# Patient Record
Sex: Female | Born: 1955 | Race: Black or African American | Hispanic: No | Marital: Married | Smoking: Never smoker
Health system: Southern US, Community
[De-identification: ages and names within clinical notes are randomized; demographics above are authoritative.]

## PROBLEM LIST (undated history)

## (undated) DIAGNOSIS — K219 Gastro-esophageal reflux disease without esophagitis: Secondary | ICD-10-CM

## (undated) DIAGNOSIS — I471 Supraventricular tachycardia, unspecified: Secondary | ICD-10-CM

## (undated) DIAGNOSIS — E1142 Type 2 diabetes mellitus with diabetic polyneuropathy: Secondary | ICD-10-CM

## (undated) DIAGNOSIS — Z8719 Personal history of other diseases of the digestive system: Secondary | ICD-10-CM

## (undated) DIAGNOSIS — E119 Type 2 diabetes mellitus without complications: Secondary | ICD-10-CM

## (undated) DIAGNOSIS — M1990 Unspecified osteoarthritis, unspecified site: Secondary | ICD-10-CM

## (undated) DIAGNOSIS — K589 Irritable bowel syndrome without diarrhea: Secondary | ICD-10-CM

## (undated) DIAGNOSIS — I700 Atherosclerosis of aorta: Secondary | ICD-10-CM

## (undated) DIAGNOSIS — I480 Paroxysmal atrial fibrillation: Secondary | ICD-10-CM

## (undated) DIAGNOSIS — R51 Headache: Secondary | ICD-10-CM

## (undated) DIAGNOSIS — N183 Chronic kidney disease, stage 3 unspecified: Secondary | ICD-10-CM

## (undated) DIAGNOSIS — K635 Polyp of colon: Secondary | ICD-10-CM

## (undated) DIAGNOSIS — I739 Peripheral vascular disease, unspecified: Secondary | ICD-10-CM

## (undated) DIAGNOSIS — R490 Dysphonia: Secondary | ICD-10-CM

## (undated) DIAGNOSIS — R0600 Dyspnea, unspecified: Secondary | ICD-10-CM

## (undated) DIAGNOSIS — M7062 Trochanteric bursitis, left hip: Secondary | ICD-10-CM

## (undated) DIAGNOSIS — I499 Cardiac arrhythmia, unspecified: Secondary | ICD-10-CM

## (undated) DIAGNOSIS — K6389 Other specified diseases of intestine: Secondary | ICD-10-CM

## (undated) DIAGNOSIS — I2510 Atherosclerotic heart disease of native coronary artery without angina pectoris: Secondary | ICD-10-CM

## (undated) DIAGNOSIS — E079 Disorder of thyroid, unspecified: Secondary | ICD-10-CM

## (undated) DIAGNOSIS — I452 Bifascicular block: Secondary | ICD-10-CM

## (undated) DIAGNOSIS — I454 Nonspecific intraventricular block: Secondary | ICD-10-CM

## (undated) DIAGNOSIS — D509 Iron deficiency anemia, unspecified: Secondary | ICD-10-CM

## (undated) DIAGNOSIS — M7582 Other shoulder lesions, left shoulder: Secondary | ICD-10-CM

## (undated) DIAGNOSIS — D631 Anemia in chronic kidney disease: Secondary | ICD-10-CM

## (undated) DIAGNOSIS — I10 Essential (primary) hypertension: Secondary | ICD-10-CM

## (undated) DIAGNOSIS — Z7982 Long term (current) use of aspirin: Secondary | ICD-10-CM

## (undated) DIAGNOSIS — Z8619 Personal history of other infectious and parasitic diseases: Secondary | ICD-10-CM

## (undated) DIAGNOSIS — D649 Anemia, unspecified: Secondary | ICD-10-CM

## (undated) DIAGNOSIS — R519 Headache, unspecified: Secondary | ICD-10-CM

## (undated) DIAGNOSIS — C73 Malignant neoplasm of thyroid gland: Secondary | ICD-10-CM

## (undated) DIAGNOSIS — E1140 Type 2 diabetes mellitus with diabetic neuropathy, unspecified: Secondary | ICD-10-CM

## (undated) DIAGNOSIS — K2970 Gastritis, unspecified, without bleeding: Secondary | ICD-10-CM

## (undated) DIAGNOSIS — C801 Malignant (primary) neoplasm, unspecified: Secondary | ICD-10-CM

## (undated) DIAGNOSIS — H269 Unspecified cataract: Secondary | ICD-10-CM

## (undated) DIAGNOSIS — R0609 Other forms of dyspnea: Secondary | ICD-10-CM

## (undated) DIAGNOSIS — R001 Bradycardia, unspecified: Secondary | ICD-10-CM

## (undated) DIAGNOSIS — E785 Hyperlipidemia, unspecified: Secondary | ICD-10-CM

## (undated) DIAGNOSIS — Z85850 Personal history of malignant neoplasm of thyroid: Secondary | ICD-10-CM

## (undated) DIAGNOSIS — R1310 Dysphagia, unspecified: Secondary | ICD-10-CM

## (undated) DIAGNOSIS — I4891 Unspecified atrial fibrillation: Secondary | ICD-10-CM

## (undated) DIAGNOSIS — E559 Vitamin D deficiency, unspecified: Secondary | ICD-10-CM

## (undated) HISTORY — DX: Type 2 diabetes mellitus with diabetic polyneuropathy: E11.42

## (undated) HISTORY — DX: Other specified diseases of intestine: K63.89

## (undated) HISTORY — PX: CORONARY ARTERY BYPASS GRAFT: SHX141

## (undated) HISTORY — DX: Disorder of thyroid, unspecified: E07.9

## (undated) HISTORY — PX: CHOLECYSTECTOMY: SHX55

## (undated) HISTORY — DX: Unspecified cataract: H26.9

## (undated) HISTORY — PX: CORONARY ANGIOPLASTY: SHX604

## (undated) HISTORY — PX: THYROIDECTOMY, PARTIAL: SHX18

## (undated) HISTORY — DX: Malignant (primary) neoplasm, unspecified: C80.1

## (undated) HISTORY — PX: JOINT REPLACEMENT: SHX530

## (undated) HISTORY — PX: ABDOMINAL HYSTERECTOMY: SHX81

---
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
## 2000-11-22 HISTORY — PX: CORONARY ARTERY BYPASS GRAFT: SHX141

## 2001-05-03 DIAGNOSIS — Z951 Presence of aortocoronary bypass graft: Secondary | ICD-10-CM

## 2001-05-03 DIAGNOSIS — I2542 Coronary artery dissection: Secondary | ICD-10-CM

## 2001-05-03 DIAGNOSIS — I2510 Atherosclerotic heart disease of native coronary artery without angina pectoris: Secondary | ICD-10-CM

## 2001-05-03 HISTORY — PX: CORONARY ARTERY BYPASS GRAFT: SHX141

## 2001-05-03 HISTORY — DX: Presence of aortocoronary bypass graft: Z95.1

## 2001-05-03 HISTORY — DX: Atherosclerotic heart disease of native coronary artery without angina pectoris: I25.10

## 2001-05-03 HISTORY — DX: Coronary artery dissection: I25.42

## 2001-05-03 HISTORY — PX: LEFT HEART CATH AND CORONARY ANGIOGRAPHY: CATH118249

## 2004-02-12 HISTORY — PX: CORONARY ANGIOPLASTY WITH STENT PLACEMENT: SHX49

## 2013-11-02 ENCOUNTER — Ambulatory Visit: Payer: Self-pay | Admitting: Gastroenterology

## 2013-11-05 LAB — PATHOLOGY REPORT

## 2013-11-06 ENCOUNTER — Ambulatory Visit: Payer: Self-pay | Admitting: Family Medicine

## 2014-04-09 ENCOUNTER — Ambulatory Visit: Payer: Self-pay | Admitting: Family Medicine

## 2014-05-01 DIAGNOSIS — E039 Hypothyroidism, unspecified: Secondary | ICD-10-CM | POA: Insufficient documentation

## 2014-05-01 DIAGNOSIS — I4891 Unspecified atrial fibrillation: Secondary | ICD-10-CM | POA: Insufficient documentation

## 2014-10-22 ENCOUNTER — Ambulatory Visit: Payer: Self-pay | Admitting: Family Medicine

## 2014-11-08 ENCOUNTER — Ambulatory Visit: Payer: Self-pay | Admitting: Family Medicine

## 2014-11-22 ENCOUNTER — Ambulatory Visit: Payer: Self-pay | Admitting: Family Medicine

## 2014-12-04 ENCOUNTER — Ambulatory Visit: Payer: Self-pay | Admitting: Family Medicine

## 2014-12-27 ENCOUNTER — Ambulatory Visit: Payer: Self-pay | Admitting: Gastroenterology

## 2014-12-27 LAB — COMPREHENSIVE METABOLIC PANEL
ALBUMIN: 3.3 g/dL — AB (ref 3.4–5.0)
Alkaline Phosphatase: 71 U/L (ref 46–116)
Anion Gap: 9 (ref 7–16)
BUN: 37 mg/dL — AB (ref 7–18)
Bilirubin,Total: 0.5 mg/dL (ref 0.2–1.0)
CALCIUM: 8.9 mg/dL (ref 8.5–10.1)
Chloride: 96 mmol/L — ABNORMAL LOW (ref 98–107)
Co2: 33 mmol/L — ABNORMAL HIGH (ref 21–32)
Creatinine: 2.24 mg/dL — ABNORMAL HIGH (ref 0.60–1.30)
EGFR (African American): 29 — ABNORMAL LOW
EGFR (Non-African Amer.): 24 — ABNORMAL LOW
GLUCOSE: 138 mg/dL — AB (ref 65–99)
Osmolality: 287 (ref 275–301)
POTASSIUM: 3.1 mmol/L — AB (ref 3.5–5.1)
SGOT(AST): 57 U/L — ABNORMAL HIGH (ref 15–37)
SGPT (ALT): 107 U/L — ABNORMAL HIGH (ref 14–63)
Sodium: 138 mmol/L (ref 136–145)
Total Protein: 7.3 g/dL (ref 6.4–8.2)

## 2015-01-02 ENCOUNTER — Ambulatory Visit: Payer: Self-pay | Admitting: Gastroenterology

## 2015-01-10 ENCOUNTER — Ambulatory Visit: Payer: Self-pay | Admitting: Gastroenterology

## 2015-03-03 ENCOUNTER — Ambulatory Visit
Admit: 2015-03-03 | Disposition: A | Discharge status: Home / Self Care | Payer: Self-pay | Attending: Surgery | Admitting: Surgery

## 2015-03-10 ENCOUNTER — Ambulatory Visit
Admit: 2015-03-10 | Disposition: A | Discharge status: Home / Self Care | Payer: Self-pay | Attending: Internal Medicine | Admitting: Internal Medicine

## 2015-03-10 LAB — COMPREHENSIVE METABOLIC PANEL
ALBUMIN: 4.1 g/dL
ALK PHOS: 60 U/L
ALT: 39 U/L
AST: 34 U/L
Anion Gap: 9 (ref 7–16)
BUN: 16 mg/dL
Bilirubin,Total: 0.4 mg/dL
CALCIUM: 9 mg/dL
CHLORIDE: 96 mmol/L — AB
Co2: 31 mmol/L
Creatinine: 1.18 mg/dL — ABNORMAL HIGH
EGFR (African American): 58 — ABNORMAL LOW
EGFR (Non-African Amer.): 50 — ABNORMAL LOW
GLUCOSE: 99 mg/dL
POTASSIUM: 2.7 mmol/L — AB
Sodium: 136 mmol/L
Total Protein: 7.7 g/dL

## 2015-03-10 LAB — CBC CANCER CENTER
BASOS PCT: 1.3 %
Basophil #: 0.1 x10 3/mm (ref 0.0–0.1)
EOS PCT: 2 %
Eosinophil #: 0.2 x10 3/mm (ref 0.0–0.7)
HCT: 36 % (ref 35.0–47.0)
HGB: 11.6 g/dL — ABNORMAL LOW (ref 12.0–16.0)
Lymphocyte #: 3.2 x10 3/mm (ref 1.0–3.6)
Lymphocyte %: 40 %
MCH: 26.7 pg (ref 26.0–34.0)
MCHC: 32.4 g/dL (ref 32.0–36.0)
MCV: 83 fL (ref 80–100)
MONO ABS: 0.5 x10 3/mm (ref 0.2–0.9)
Monocyte %: 6.1 %
NEUTROS ABS: 4 x10 3/mm (ref 1.4–6.5)
NEUTROS PCT: 50.6 %
Platelet: 310 x10 3/mm (ref 150–440)
RBC: 4.36 10*6/uL (ref 3.80–5.20)
RDW: 14.5 % (ref 11.5–14.5)
WBC: 7.9 x10 3/mm (ref 3.6–11.0)

## 2015-03-10 LAB — PROTIME-INR
INR: 1
Prothrombin Time: 13.3 secs

## 2015-03-10 LAB — APTT: ACTIVATED PTT: 24.2 s (ref 23.6–35.9)

## 2015-03-13 LAB — CEA: CEA: 2.6 ng/mL (ref 0.0–4.7)

## 2015-03-13 LAB — CA 125: CA 125: 10.7 U/mL (ref 0.0–34.0)

## 2015-03-17 ENCOUNTER — Ambulatory Visit
Admit: 2015-03-17 | Disposition: A | Discharge status: Home / Self Care | Payer: Self-pay | Attending: Internal Medicine | Admitting: Internal Medicine

## 2015-03-17 LAB — SURGICAL PATHOLOGY

## 2015-03-19 ENCOUNTER — Other Ambulatory Visit: Payer: Self-pay | Admitting: Internal Medicine

## 2015-03-19 DIAGNOSIS — R1900 Intra-abdominal and pelvic swelling, mass and lump, unspecified site: Secondary | ICD-10-CM

## 2015-03-20 ENCOUNTER — Other Ambulatory Visit: Payer: Self-pay | Admitting: Internal Medicine

## 2015-03-20 DIAGNOSIS — K6389 Other specified diseases of intestine: Secondary | ICD-10-CM

## 2015-03-20 DIAGNOSIS — D219 Benign neoplasm of connective and other soft tissue, unspecified: Secondary | ICD-10-CM

## 2015-03-24 NOTE — Progress Notes (Signed)
Spoke with Hailey Howard. EUS scheduled for 04-10-15. Instructed on registration, calling to find arrival time, prep. Reports asa as only anticoagulant. These directions also mailed to her home address along with my contact information for any further questions that may arise.

## 2015-03-24 NOTE — Progress Notes (Signed)
Hailey Howard left voicemail to return call. Need to arrange EUS for 04-10-15 at [facility]

## 2015-03-25 ENCOUNTER — Other Ambulatory Visit: Payer: Self-pay | Admitting: Otolaryngology

## 2015-03-25 DIAGNOSIS — E041 Nontoxic single thyroid nodule: Secondary | ICD-10-CM

## 2015-03-28 ENCOUNTER — Ambulatory Visit
Admission: RE | Admit: 2015-03-28 | Discharge: 2015-03-28 | Disposition: A | Discharge status: Home / Self Care | Payer: Medicare HMO | Source: Ambulatory Visit | Attending: Otolaryngology | Admitting: Otolaryngology

## 2015-03-28 DIAGNOSIS — E041 Nontoxic single thyroid nodule: Secondary | ICD-10-CM | POA: Diagnosis present

## 2015-03-31 ENCOUNTER — Ambulatory Visit
Admission: RE | Admit: 2015-03-31 | Discharge: 2015-03-31 | Disposition: A | Discharge status: Home / Self Care | Payer: Medicare HMO | Source: Ambulatory Visit | Attending: Internal Medicine | Admitting: Internal Medicine

## 2015-03-31 DIAGNOSIS — R1900 Intra-abdominal and pelvic swelling, mass and lump, unspecified site: Secondary | ICD-10-CM | POA: Insufficient documentation

## 2015-03-31 DIAGNOSIS — D219 Benign neoplasm of connective and other soft tissue, unspecified: Secondary | ICD-10-CM

## 2015-03-31 MED ORDER — INDIUM IN-111 PENTETREOTIDE IV KIT
7.1800 | PACK | Freq: Once | INTRAVENOUS | Status: AC | PRN
  Administered 2015-03-31: 7.18 via INTRAVENOUS

## 2015-04-01 ENCOUNTER — Encounter
Admission: RE | Admit: 2015-04-01 | Discharge: 2015-04-01 | Disposition: A | Discharge status: Home / Self Care | Payer: Medicare HMO | Source: Ambulatory Visit | Attending: Internal Medicine | Admitting: Internal Medicine

## 2015-04-01 DIAGNOSIS — R1900 Intra-abdominal and pelvic swelling, mass and lump, unspecified site: Secondary | ICD-10-CM | POA: Diagnosis not present

## 2015-04-02 ENCOUNTER — Encounter
Admission: RE | Admit: 2015-04-02 | Discharge: 2015-04-02 | Disposition: A | Discharge status: Home / Self Care | Payer: Medicare HMO | Source: Ambulatory Visit | Attending: Internal Medicine | Admitting: Internal Medicine

## 2015-04-08 ENCOUNTER — Other Ambulatory Visit: Payer: Self-pay | Admitting: Otolaryngology

## 2015-04-08 DIAGNOSIS — E041 Nontoxic single thyroid nodule: Secondary | ICD-10-CM

## 2015-04-09 ENCOUNTER — Encounter: Payer: Self-pay | Admitting: *Deleted

## 2015-04-09 ENCOUNTER — Inpatient Hospital Stay: Payer: Medicare HMO | Attending: Internal Medicine | Admitting: Internal Medicine

## 2015-04-09 VITALS — BP 105/72 | HR 74 | Temp 96.8°F | Ht 67.0 in | Wt 226.0 lb

## 2015-04-09 DIAGNOSIS — R634 Abnormal weight loss: Secondary | ICD-10-CM

## 2015-04-09 DIAGNOSIS — R1900 Intra-abdominal and pelvic swelling, mass and lump, unspecified site: Secondary | ICD-10-CM

## 2015-04-09 DIAGNOSIS — E119 Type 2 diabetes mellitus without complications: Secondary | ICD-10-CM | POA: Diagnosis not present

## 2015-04-09 DIAGNOSIS — R109 Unspecified abdominal pain: Secondary | ICD-10-CM

## 2015-04-09 DIAGNOSIS — R1310 Dysphagia, unspecified: Secondary | ICD-10-CM | POA: Insufficient documentation

## 2015-04-09 DIAGNOSIS — Z79899 Other long term (current) drug therapy: Secondary | ICD-10-CM | POA: Diagnosis not present

## 2015-04-09 DIAGNOSIS — R599 Enlarged lymph nodes, unspecified: Secondary | ICD-10-CM | POA: Diagnosis not present

## 2015-04-09 DIAGNOSIS — I2510 Atherosclerotic heart disease of native coronary artery without angina pectoris: Secondary | ICD-10-CM

## 2015-04-09 DIAGNOSIS — R1903 Right lower quadrant abdominal swelling, mass and lump: Secondary | ICD-10-CM | POA: Diagnosis present

## 2015-04-09 DIAGNOSIS — I10 Essential (primary) hypertension: Secondary | ICD-10-CM

## 2015-04-10 ENCOUNTER — Encounter
Admission: RE | Disposition: A | Discharge status: Home / Self Care | Payer: Self-pay | Source: Ambulatory Visit | Attending: Gastroenterology

## 2015-04-10 ENCOUNTER — Encounter: Payer: Self-pay | Admitting: Anesthesiology

## 2015-04-10 ENCOUNTER — Ambulatory Visit
Admission: RE | Admit: 2015-04-10 | Discharge: 2015-04-10 | Disposition: A | Discharge status: Home / Self Care | Payer: Medicare HMO | Source: Ambulatory Visit | Attending: Gastroenterology | Admitting: Gastroenterology

## 2015-04-10 DIAGNOSIS — Z539 Procedure and treatment not carried out, unspecified reason: Secondary | ICD-10-CM | POA: Diagnosis not present

## 2015-04-10 DIAGNOSIS — R1900 Intra-abdominal and pelvic swelling, mass and lump, unspecified site: Secondary | ICD-10-CM | POA: Diagnosis present

## 2015-04-10 HISTORY — DX: Essential (primary) hypertension: I10

## 2015-04-10 HISTORY — DX: Type 2 diabetes mellitus without complications: E11.9

## 2015-04-10 HISTORY — DX: Unspecified osteoarthritis, unspecified site: M19.90

## 2015-04-10 HISTORY — DX: Anemia, unspecified: D64.9

## 2015-04-10 HISTORY — DX: Gastro-esophageal reflux disease without esophagitis: K21.9

## 2015-04-10 HISTORY — DX: Atherosclerotic heart disease of native coronary artery without angina pectoris: I25.10

## 2015-04-10 SURGERY — UPPER ENDOSCOPIC ULTRASOUND (EUS) RADIAL
Anesthesia: General

## 2015-04-10 MED ORDER — SODIUM CHLORIDE 0.9 % IV SOLN: INTRAVENOUS | Status: DC

## 2015-04-10 MED ORDER — SODIUM CHLORIDE 0.9 % IJ SOLN
1000.0000 mL | Freq: Once | INTRAMUSCULAR | Status: AC
  Administered 2015-04-10: 1000 mL via INTRAVENOUS
  Filled 2015-04-10: qty 1002

## 2015-04-11 ENCOUNTER — Telehealth: Payer: Self-pay

## 2015-04-11 NOTE — Telephone Encounter (Signed)
Oncology Nurse Navigator Documentation  Oncology Nurse Navigator Flowsheets 04/11/2015  Navigator Encounter Type Telephone  Barriers/Navigation Needs Education  Interventions Coordination of Care  Coordination of Care EUS  Time Spent with Patient 21   Was notified by Endo that Hailey Howard ate and that her EUS was unable to be performed. Dr Ma Hillock was notified. She stated she "just forget". Rescheduled EUS for 04/28/15 with Dr Mont Dutton. Teachback performed on instructions as before and they are also being mailed to her home address. She is aware of registration process, calling to find out arrival time, remaining without anything to eat or drink after midnight. She was also told to monitor her blood sugar closely. ASA 81mg  remains only anticoagulant.

## 2015-04-14 ENCOUNTER — Telehealth: Payer: Self-pay

## 2015-04-14 ENCOUNTER — Other Ambulatory Visit: Payer: Self-pay | Admitting: Radiology

## 2015-04-14 NOTE — Telephone Encounter (Signed)
Oncology Nurse Navigator Documentation  Oncology Nurse Navigator Flowsheets 04/11/2015 04/14/2015  Navigator Encounter Type Telephone -  Barriers/Navigation Needs Education -  Interventions Coordination of Care Coordination of Care  Coordination of Care EUS EUS  Time Spent with Patient 30 15   Appt now available on 6/2. Message left to see if Hailey Howard would be interested in 6/2 instead of 6/16. Asked her to return call.

## 2015-04-14 NOTE — Telephone Encounter (Signed)
Oncology Nurse Navigator Documentation  Oncology Nurse Navigator Flowsheets 04/11/2015 04/14/2015 04/14/2015  Navigator Encounter Type Telephone Telephone Telephone  Barriers/Navigation Needs Education - -  Interventions Coordination of Care Coordination of Care Coordination of Care  Coordination of Care EUS EUS EUS  Time Spent with Patient 30 15 15    Spoke with Hailey Howard on the phone. EUS date changed to 04/24/15 Oncology Nurse Navigator Documentation  Oncology Nurse Navigator Flowsheets 04/11/2015 04/14/2015 04/14/2015  Navigator Encounter Type Telephone Telephone Telephone  Barriers/Navigation Needs Education - -  Interventions Coordination of Care Coordination of Care Coordination of Care  Coordination of Care EUS EUS EUS  Time Spent with Patient [id number]

## 2015-04-15 ENCOUNTER — Ambulatory Visit
Admission: RE | Admit: 2015-04-15 | Discharge: 2015-04-15 | Disposition: A | Discharge status: Home / Self Care | Payer: Medicare HMO | Source: Ambulatory Visit | Attending: Otolaryngology | Admitting: Otolaryngology

## 2015-04-15 DIAGNOSIS — E041 Nontoxic single thyroid nodule: Secondary | ICD-10-CM

## 2015-04-15 HISTORY — DX: Headache, unspecified: R51.9

## 2015-04-15 HISTORY — DX: Cardiac arrhythmia, unspecified: I49.9

## 2015-04-15 HISTORY — DX: Personal history of other diseases of the digestive system: Z87.19

## 2015-04-15 HISTORY — DX: Headache: R51

## 2015-04-15 NOTE — Procedures (Signed)
Interventional Radiology Procedure Note  Procedure: US guided biopsy of right thyroid nodule.  Findings:  [facility] cyto-technologists present to confirm adequacy of the samples.   Complications: No immediate Recommendations:  - Ok to shower tomorrow   - Routine care   Signed,  Dulcy Fanny. Earleen Newport, DO

## 2015-04-15 NOTE — H&P (Signed)
Chief Complaint: Thyroid nodule  Referring Physician(s): Bennett,Paul  History of Present Illness: Hailey Howard is a 59 y.o. female referred for image guided biopsy of right thyroid nodule.   Past Medical History  Diagnosis Date  . Hypertension   . Diabetes mellitus without complication   . GERD (gastroesophageal reflux disease)   . Anemia   . Coronary artery disease   . Arthritis   . Dysrhythmia   . History of hiatal hernia   . Headache     Past Surgical History  Procedure Laterality Date  . Coronary artery bypass graft    . Cholecystectomy    . Abdominal hysterectomy    . Thyroidectomy, partial      Allergies: Other; Etodolac; Iodinated diagnostic agents; and Oxycodone-acetaminophen  Medications: Prior to Admission medications   Medication Sig Start Date End Date Taking? Authorizing Provider  amLODipine (NORVASC) 5 MG tablet Take 1 tablet by mouth daily. 04/19/14  Yes Historical Provider, MD  aspirin EC 81 MG tablet Take 1 tablet by mouth daily.   Yes Historical Provider, MD  Calcium Citrate-Vitamin D (CALCIUM CITRATE + D3 PO) Take 660 mg by mouth daily.  05/23/14  Yes Historical Provider, MD  cholecalciferol (VITAMIN D) 1000 UNITS tablet Take 1 tablet by mouth daily. 06/03/14  Yes Historical Provider, MD  ferrous sulfate 325 (65 FE) MG tablet Take 325 mg by mouth daily with breakfast.   Yes Historical Provider, MD  furosemide (LASIX) 40 MG tablet Take 1 tablet by mouth daily. 01/03/14  Yes Historical Provider, MD  gabapentin (NEURONTIN) 300 MG capsule Take 1 capsule by mouth daily as needed (nerve pain).    Yes Historical Provider, MD  Insulin Glargine (LANTUS) 100 UNIT/ML Solostar Pen 50 Units every evening. 01/23/14  Yes Historical Provider, MD  Linaclotide Rolan Lipa) 290 MCG CAPS capsule Take 1 capsule by mouth daily. 03/04/15  Yes Historical Provider, MD  Liraglutide 18 MG/3ML SOPN Inject 1.8 mg into the skin daily.   Yes Historical Provider, MD  metFORMIN  (GLUCOPHAGE-XR) 500 MG 24 hr tablet Take 500 mg by mouth daily.   Yes Historical Provider, MD  mineral oil-hydrophilic petrolatum (AQUAPHOR) ointment Apply topically as needed for dry skin.   Yes Historical Provider, MD  pantoprazole (PROTONIX) 40 MG tablet Take 1 tablet by mouth daily. 12/24/14  Yes Historical Provider, MD  potassium chloride SA (K-DUR,KLOR-CON) 20 MEQ tablet Take 1 tablet by mouth daily. 01/23/14  Yes Historical Provider, MD  quinapril-hydrochlorothiazide (ACCURETIC) 20-25 MG per tablet Take 1 tablet by mouth daily. 04/19/14  Yes Historical Provider, MD  rosuvastatin (CRESTOR) 40 MG tablet Take 40 mg by mouth daily.  02/14/14  Yes Historical Provider, MD  sotalol (BETAPACE) 80 MG tablet Take 1 tablet by mouth 2 (two) times daily. 04/19/14  Yes Historical Provider, MD  sucralfate (CARAFATE) 1 G tablet Take 1 tablet by mouth daily.    Yes Historical Provider, MD  Carboxymethylcellulose Sodium (GOODSENSE LUBRICATING EYE DROP OP) Apply 2 drops to eye daily.    Historical Provider, MD  Denture Care Products (EFFERDENT DENTURE CLEANSER) TBEF Take 1 tablet by mouth daily. 02/06/15   Historical Provider, MD  fluticasone (FLONASE) 50 MCG/ACT nasal spray Place 2 sprays into both nostrils daily.    Historical Provider, MD  promethazine (PHENERGAN) 25 MG tablet Take 25 mg by mouth every 6 (six) hours as needed for nausea or vomiting.    Historical Provider, MD     History reviewed. No pertinent family history.  History  Social History  . Marital Status: Single    Spouse Name: N/A  . Number of Children: N/A  . Years of Education: N/A   Social History Main Topics  . Smoking status: Never Smoker   . Smokeless tobacco: Not on file  . Alcohol Use: No  . Drug Use: No  . Sexual Activity: Not on file   Other Topics Concern  . None   Social History Narrative    Review of Systems: A 12 point ROS discussed and pertinent positives are indicated in the HPI above.  All other systems are  negative.  Review of Systems  Vital Signs: BP 113/71 mmHg  Pulse 76  Temp(Src) 99 F (37.2 C) (Oral)  Resp 18  Ht 5\' 7"  (1.702 m)  Wt 222 lb (100.699 kg)  BMI 34.76 kg/m2  SpO2 99%  Physical Exam  Mallampati Score:  2  Imaging: US Soft Tissue Head/neck  03/28/2015   CLINICAL DATA:  Enlarged asymmetric thyroid by PET-CT.  EXAM: THYROID ULTRASOUND  TECHNIQUE: Ultrasound examination of the thyroid gland and adjacent soft tissues was performed.  COMPARISON:  03/17/2015 PET scan  FINDINGS: Right thyroid lobe  Measurements: 6.4 x 3.7 x 3.8 cm. Heterogeneous macro nodular enlargement. Largest measurable right thyroid nodular mass in the midpole predominate measures 5.1 x 3.4 x 4.5 cm.  Left thyroid lobe  Measurements: 5.5 x 2.3 x 1.8 cm. Similar heterogeneous macronodular diffuse enlargement. Dominant complex left mid to lower pole nodule measures 2.1 x 1.7 x 1.1 cm. Additional small hypoechoic left thyroid nodules throughout measuring 7 mm or less in size.  Isthmus  Thickness: 5.4 mm. Solid heterogeneous echogenic isthmus nodule measures 14 x 16 x 15 mm.  Lymphadenopathy  None visualized.  IMPRESSION: Enlarged heterogeneous nodular thyroid. Dominant measurable nodules bilaterally, as above. 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  Consider follow-up ultrasound in 12 months. If patient is clinically hyperthyroid, consider nuclear medicine thyroid uptake and scan.Reference: Management of Thyroid Nodules Detected at [place]: Society of Radiologists in [place]. Radiology 2005; [id number].   Electronically Signed   By: Jerilynn Mages.  Shick M.D.   On: 03/28/2015 11:42   Nm Pet Nopr Skull Base To Thigh  03/17/2015   CLINICAL DATA:  Initial treatment strategy for 59 year old female with history of unintentional weight loss and right-sided abdominal pain with pelvic adenopathy.  EXAM: NUCLEAR MEDICINE PET SKULL BASE TO THIGH  TECHNIQUE: 12.2 mCi F-18 FDG was injected intravenously. Full-ring  PET imaging was performed from the skull base to thigh after the radiotracer. CT data was obtained and used for attenuation correction and anatomic localization.  FASTING BLOOD GLUCOSE:  Value: 182 mg/dl  COMPARISON:  CT the abdomen and pelvis 01/02/2015.  FINDINGS: NECK  No hypermetabolic lymph nodes in the neck. Mass-like enlargement of the right lobe of the thyroid gland which measures up to 4.1 x 3.5 cm, which demonstrates heterogeneous metabolic activity which is similar to the contralateral side of the gland, favored to reflect an asymmetric goiter.  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 No other hypermetabolic mediastinal or hilar nodes. No suspicious pulmonary nodules on the CT scan. Heart size is  normal. There is no significant pericardial fluid, thickening or pericardial calcification. There is atherosclerosis of the thoracic aorta, the great vessels of the mediastinum and the coronary arteries, including calcified atherosclerotic plaque in the left main, left anterior descending, left circumflex and right coronary arteries. Status post median sternotomy for CABG, including LIMA to the LAD.  ABDOMEN/PELVIS  No abnormal hypermetabolic activity within the liver, pancreas, adrenal glands, or spleen. Previously described lymph node or mesenteric nodule in the superficial aspect of the right lower quadrant is similar in size and appearance to the prior study measuring 2.0 x 1.6 cm (image 216 of series 3) and demonstrates only low-level metabolic activity (SUVmax = 2.8). Likewise, the mildly enlarged ileocolic lymph node seen on the prior examination currently  measures 11 mm in short axis (image 193 of series 3), and also demonstrates only low-level metabolic activity (SUVmax = 2.3). Normal appendix, which contains multiple appendicoliths. Status post cholecystectomy. No significant volume of ascites. No pneumoperitoneum. No pathologic distention of small bowel or colon. Status post hysterectomy. Ovaries are unremarkable in appearance.  SKELETON  No focal hypermetabolic activity to suggest skeletal metastasis.  IMPRESSION: 1. Small soft tissue attenuation nodule in the superficial aspect of the right lower quadrant of the abdomen/pelvis is similar in size in appearance to prior examinations, and demonstrates only low-level metabolic activity. Likewise, the mildly enlarged ileocolic lymph node previously noted also demonstrates only low-level metabolic activity. These findings are nonspecific. 2. Well-circumscribed 3.6 x 1.8 cm intermediate attenuation lesion intimately associated with the proximal trachea and esophagus in the superior mediastinum, as above. 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 Less likely, this could represent an enlarged lymph node. Correlation with endoscopy is suggested for further evaluation in the near future. 3. 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 Correlation with nonemergent thyroid ultrasound is recommended in the near future. 4. Additional incidental findings, as above.   Electronically Signed   By: Vinnie Langton M.D.   On: 03/17/2015 12:19   Nm Octreotide Localization W/spect  04/02/2015   CLINICAL DATA:  Mass within right ileocolic region identified.  EXAM: NUCLEAR MEDICINE OCTREOTIDE (SOMATOSTATIN-RECEPTOR) SCAN  TECHNIQUE: Following intravenous administration of radiopharmaceutical, whole body images of the head, neck, trunk, and extremities were obtained on subsequent  days.  RADIOPHARMACEUTICALS:  7.18 Indium-111 Octreotide IV  COMPARISON:  03/17/2015  FINDINGS: There is a normal physiologic distribution of the radiopharmaceutical.  Within the right lower quadrant, ileocolic region the previously identified soft tissue attenuating structure is again noted, image number 65 of series 1003. There is no significant radiotracer uptake associated with this structure. No specific features identified to suggest octreotide avid metastatic disease.  IMPRESSION: 1. No significant radiotracer activity associated with the right lower quadrant soft tissue attenuating structure.   Electronically Signed   By: Kerby Moors M.D.   On: 04/02/2015 13:45    Labs:  CBC:  Recent Labs  03/10/15 1640  WBC 7.9  HGB 11.6*  HCT 36.0  PLT 310    COAGS:  Recent Labs  03/10/15 1640  INR 1.0  APTT 24.2    BMP:  Recent Labs  12/27/14 0909 03/10/15 1640  NA 138 136  K 3.1* 2.7*  CL 96* 96*  CO2 33* 31  GLUCOSE 138* 99  BUN 37* 16  CALCIUM 8.9 9.0  CREATININE 2.24* 1.18*  GFRNONAA  --  50*  GFRAA  --  58*    LIVER FUNCTION TESTS:  Recent Labs  12/27/14 0909 03/10/15 1640  AST 57* 34  ALT 107* 39  ALKPHOS 71 60  PROT 7.3 7.7  ALBUMIN 3.3* 4.1    TUMOR MARKERS:  Recent Labs  03/10/15 1640  CEA 2.6    Assessment and Plan:  Ms Howard presents for US guided thyroid nodule biopsy, with local anesthesia.    Thank you for this interesting consult.  I greatly enjoyed meeting Hailey Howard and look forward to participating in their care.  SignedCorrie Mckusick 04/15/2015, 2:36 PM

## 2015-04-17 LAB — CYTOLOGY - NON PAP

## 2015-04-20 NOTE — Progress Notes (Signed)
[place]  [phone] [phone]     ID: Hailey Howard Day OB: Sep 28, 1956  MR#: [id number]  [id number]  Patient Care Team: Victory Dakin, MD as PCP - General (Internal Medicine)  CHIEF COMPLAINT/DIAGNOSIS:  Recently diagnosed right lower quadrant abdominal masses, seen on CT scan done 01/02/15 which reported a 17 mm nodule in the right lower abdomen immediately beneath the abdominal wall which is stable at 9 months. There is new adenopathy in the ileocolic mesenteric measuring 24 x 12 mm. Had EGD on 12/27/14.  01/10/15 - CT-guided biopsy of above masses, Pathology Report - DIAGNOSIS: A. PERITONEAL MASS, LOW ANTERIOR PELVIS; NEEDLE BIOPSY: SMOOTH MUSCLE CELL PROLIFERATION; NO CYTOLOGIC ATYPIA, INCREASED MITOTIC ACTIVITY, OR NECROSIS. CONSISTENT WITH LEIOMYOMA. SEE COMMENT.   B. PERITONEAL MASS, LOW ANTERIOR PELVIS; NEEDLE BIOPSY: NO EVIDENCE OF A LYMPHOPROLIFERATIVE PROLIFERATION/DISORDER. SEE FLOW CYTOMETRY REPORT WHICH HAS BEEN SCANNED INTO THE MEDICAL RECORD.  -  patient undergoing workup.     HISTORY OF PRESENT ILLNESS:  Patient returns for continued oncology follow-up, she is seeing ENT and is planned for EUS by [facility] here for difficulty swallowing and upper chest abnormality seen on recent CT/PET scan. Also labs done shows elevated serum chromogranin A at 31. States that she is eating better, nausea and vomiting is better, diarrhea has also slowed down and she denies any blood in stools or melena. She had an EGD done on February 5 which reported no malignancy, there were changes suggestive of early fundic gland polyp, negative for H. pylori, dysplasia, or malignancy. Colonoscopy Dec 2014 with removal of 4 polyps (tubular adenomas). Patient has been seen by surgeon, Dr. Tamala Julian, and is being considered for surgical exploration given intermittent recurrent abdominal symptoms. Chronic arthritis is same, no new bone pains.    REVIEW OF SYSTEMS:     ROS As in HPI above. In addition, no fevers or night sweats. No new headaches or focal weakness.  No new skin rash or bleeding symptoms. No new paresthesias in extremities.   PAST MEDICAL HISTORY: Past Medical History  Diagnosis Date  . Hypertension   . Diabetes mellitus without complication   . GERD (gastroesophageal reflux disease)   . Anemia   . Coronary artery disease   . Arthritis   . Dysrhythmia   . History of hiatal hernia   . Headache           Diabetes mellitus  Hypertension  GERD  Anemia  Coronary artery disease, status post CABG surgery  Arthritis  Cholecystectomy  Partial hysterectomy, ovaries spared  Partial thyroidectomy for reportedly benign nodule   PAST SURGICAL HISTORY: Past Surgical History  Procedure Laterality Date  . Coronary artery bypass graft    . Cholecystectomy    . Abdominal hysterectomy    . Thyroidectomy, partial      FAMILY HISTORY:  remarkable for hypertension, diabetes, heart disease. Mother had colon cancer about age 26. Brother had stomach cancer. Denies other malignancies.   ADVANCED DIRECTIVES:   SOCIAL HISTORY: History  Substance Use Topics  . Smoking status: Never Smoker   . Smokeless tobacco: Not on file  . Alcohol Use: No  Denies smoking, alcohol or recreational drug usage. Physically active.  Allergies  Allergen Reactions  . Other Anaphylaxis    Iv dye   . Etodolac Itching  . Iodinated Diagnostic Agents Other (See Comments)  . Oxycodone-Acetaminophen Hives    Current Outpatient Prescriptions  Medication Sig Dispense Refill  . amLODipine (NORVASC) 5 MG tablet  Take 1 tablet by mouth daily.    Marland Kitchen aspirin EC 81 MG tablet Take 1 tablet by mouth daily.    . Calcium Citrate-Vitamin D (CALCIUM CITRATE + D3 PO) Take 660 mg by mouth daily.     . Carboxymethylcellulose Sodium (GOODSENSE LUBRICATING EYE DROP OP) Apply 2 drops to eye daily.    . cholecalciferol (VITAMIN D) 1000 UNITS tablet Take 1 tablet by mouth daily.    .  furosemide (LASIX) 40 MG tablet Take 1 tablet by mouth daily.    Marland Kitchen gabapentin (NEURONTIN) 300 MG capsule Take 1 capsule by mouth daily as needed (nerve pain).     . Insulin Glargine (LANTUS) 100 UNIT/ML Solostar Pen 50 Units every evening.    . Linaclotide (LINZESS) 290 MCG CAPS capsule Take 1 capsule by mouth daily.    . Liraglutide 18 MG/3ML SOPN Inject 1.8 mg into the skin daily.    . metFORMIN (GLUCOPHAGE-XR) 500 MG 24 hr tablet Take 500 mg by mouth daily.    . pantoprazole (PROTONIX) 40 MG tablet Take 1 tablet by mouth daily.    . potassium chloride SA (K-DUR,KLOR-CON) 20 MEQ tablet Take 1 tablet by mouth daily.    . quinapril-hydrochlorothiazide (ACCURETIC) 20-25 MG per tablet Take 1 tablet by mouth daily.    . rosuvastatin (CRESTOR) 40 MG tablet Take 40 mg by mouth daily.     . sotalol (BETAPACE) 80 MG tablet Take 1 tablet by mouth 2 (two) times daily.    . Denture Care Products (EFFERDENT DENTURE CLEANSER) TBEF Take 1 tablet by mouth daily.    . ferrous sulfate 325 (65 FE) MG tablet Take 325 mg by mouth daily with breakfast.    . fluticasone (FLONASE) 50 MCG/ACT nasal spray Place 2 sprays into both nostrils daily.    . mineral oil-hydrophilic petrolatum (AQUAPHOR) ointment Apply topically as needed for dry skin.    . promethazine (PHENERGAN) 25 MG tablet Take 25 mg by mouth every 6 (six) hours as needed for nausea or vomiting.    . sucralfate (CARAFATE) 1 G tablet Take 1 tablet by mouth daily.      No current facility-administered medications for this visit.    OBJECTIVE: Filed Vitals:   04/09/15 1214  BP: 105/72  Pulse: 74  Temp: 96.8 F (36 C)     Body mass index is 35.38 kg/(m^2).      GENERAL: Patient is alert and oriented and in no acute distress. There is no icterus. LUNGS: Bilaterally clear to auscultation, no rhonchi. ABDOMEN: Soft, nontender. No hepatosplenomegaly clinically.  EXTREMITIES: No pedal edema.   LAB RESULTS:    Component Value Date/Time   NA 136  03/10/2015 1640   K 2.7* 03/10/2015 1640   CL 96* 03/10/2015 1640   CO2 31 03/10/2015 1640   GLUCOSE 99 03/10/2015 1640   BUN 16 03/10/2015 1640   CREATININE 1.18* 03/10/2015 1640   CALCIUM 9.0 03/10/2015 1640   PROT 7.7 03/10/2015 1640   ALBUMIN 4.1 03/10/2015 1640   AST 34 03/10/2015 1640   ALT 39 03/10/2015 1640   ALKPHOS 60 03/10/2015 1640   GFRNONAA 50* 03/10/2015 1640   GFRAA 58* 03/10/2015 1640   Lab Results  Component Value Date   WBC 7.9 03/10/2015   NEUTROABS 4.0 03/10/2015   HGB 11.6* 03/10/2015   HCT 36.0 03/10/2015   MCV 83 03/10/2015   PLT 310 03/10/2015     STUDIES: US Soft Tissue Head/neck  03/28/2015   CLINICAL DATA:  Enlarged  asymmetric thyroid by PET-CT.  EXAM: THYROID ULTRASOUND  TECHNIQUE: Ultrasound examination of the thyroid gland and adjacent soft tissues was performed.  COMPARISON:  03/17/2015 PET scan  FINDINGS: Right thyroid lobe  Measurements: 6.4 x 3.7 x 3.8 cm. Heterogeneous macro nodular enlargement. Largest measurable right thyroid nodular mass in the midpole predominate measures 5.1 x 3.4 x 4.5 cm.  Left thyroid lobe  Measurements: 5.5 x 2.3 x 1.8 cm. Similar heterogeneous macronodular diffuse enlargement. Dominant complex left mid to lower pole nodule measures 2.1 x 1.7 x 1.1 cm. Additional small hypoechoic left thyroid nodules throughout measuring 7 mm or less in size.  Isthmus  Thickness: 5.4 mm. Solid heterogeneous echogenic isthmus nodule measures 14 x 16 x 15 mm.  Lymphadenopathy  None visualized.  IMPRESSION: Enlarged heterogeneous nodular thyroid. Dominant measurable nodules bilaterally, as above. 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  Consider follow-up ultrasound in 12 months. If patient is clinically hyperthyroid, consider nuclear medicine thyroid uptake and scan.Reference: Management of Thyroid Nodules Detected at [place]: Society of Radiologists in [place]. Radiology 2005; [id number].   Electronically Signed   By: Jerilynn Mages.   Shick M.D.   On: 03/28/2015 11:42   US Thyroid Biopsy  04/15/2015   CLINICAL DATA:  59 year old female referred for percutaneous biopsy of right thyroid nodule  EXAM: ULTRASOUND GUIDED NEEDLE ASPIRATE BIOPSY OF THE THYROID GLAND  COMPARISON:  03/28/2015  PROCEDURE: The procedure, risks, benefits, and alternatives were explained to the patient. Questions regarding the procedure were encouraged and answered. The patient understands and consents to the procedure.  Ultrasound survey was performed with images stored and sent to PACs.  The right neck was prepped with chlorhexidine in a sterile fashion, and a sterile drape was applied covering the operative field. A sterile gown and sterile gloves were used for the procedure. Local anesthesia was provided with 1% Lidocaine.  Ultrasound guidance was used to infiltrate the region with 1% lidocaine for local anesthesia. Four separate 25 gauge fine needle biopsy were then acquired of the right thyroid nodule using ultrasound guidance. Images were stored.  Slide preparation was performed. Cytotechnologist was present for confirmation of adequacy.  Final image was stored after biopsy.  Patient tolerated the procedure well and remained hemodynamically stable throughout.  No complications were encountered and no significant blood loss was encounter  COMPLICATIONS: None.  FINDINGS: Ultrasound survey demonstrates irregular right-sided thyroid nodule occupying the entire right thyroid.  Images during the case demonstrate needle within the nodule on each pass.  Final image demonstrates no complicating features.  IMPRESSION: Status post ultrasound-guided biopsy of right thyroid nodule/mass. Tissue specimen sent to pathology for complete histopathologic analysis.  Signed,  Dulcy Fanny. Earleen Newport, DO  [facility]  [facility]   Electronically Signed   By: Corrie Mckusick D.O.   On: 04/15/2015 17:27   Nm Octreotide Localization  W/spect  04/02/2015   CLINICAL DATA:  Mass within right ileocolic region identified.  EXAM: NUCLEAR MEDICINE OCTREOTIDE (SOMATOSTATIN-RECEPTOR) SCAN  TECHNIQUE: Following intravenous administration of radiopharmaceutical, whole body images of the head, neck, trunk, and extremities were obtained on subsequent days.  RADIOPHARMACEUTICALS:  7.18 Indium-111 Octreotide IV  COMPARISON:  03/17/2015  FINDINGS: There is a normal physiologic distribution of the radiopharmaceutical.  Within the right lower quadrant, ileocolic region the previously identified soft tissue attenuating structure is again noted, image number 65 of series 1003. There is no significant radiotracer uptake associated with this structure. No specific features identified to suggest octreotide avid metastatic  disease.  IMPRESSION: 1. No significant radiotracer activity associated with the right lower quadrant soft tissue attenuating structure.   Electronically Signed   By: Kerby Moors M.D.   On: 04/02/2015 13:45   12/27/14 - EGD and biopsy - DIAGNOSIS: A.SMALL BOWEL AND DUODENAL BULB; BIOPSY: DUODENAL MUCOSA WITH PROMINENT BRUNNER'S GLANDS AND REACTIVE OVERLYING VILLOUS CHANGE. FEATURES OF CELIAC DISEASE ARE NOT IDENTIFIED. NEGATIVE FOR DYSPLASIA AND MALIGNANCY.    B. STOMACH, BODY; BIOPSY: OXYNTIC MUCOSA WITH FOCAL CHANGES SUGGESTIVE OF EARLY FUNDIC GLAND POLYP. NEGATIVE FOR H. PYLORI, DYSPLASIA, AND MALIGNANCY.    C. STOMACH, ANTRUM; BIOPSY: ANTRAL MUCOSA WITH REACTIVE FOVEOLAR HYPERPLASIA, COMPATIBLE WITH HEALING MUCOSAL INJURY. NEGATIVE FOR H. PYLORI, DYSPLASIA, AND MALIGNANCY.  01/02/15 - CT scan of abdomen/pelvis without contrast. IMPRESSION: 17 mm mass in the right lower quadrant of uncertain origin. Although the mass does not appear aggressive, it is most likely neoplastic and there is new ileocolic adenopathy. The nodule is beneath the abdominal wall and could likely be accessed percutaneously.  03/10/15 - serum CEA normal at 2.6, CA-125  level normal at 10.7. Serum chromogranin A is elevated at 31, serum serotonin normal range at 129.  03/17/15 - PET scan. IMPRESSION:  1. Small soft tissue attenuation nodule in the superficial aspect of the right lower quadrant of the abdomen/pelvis is similar in size in appearance to prior examinations, and demonstrates only low-level metabolic activity. Likewise, the mildly enlarged ileocolic lymph node previously noted also demonstrates only low-level metabolic activity. These findings are nonspecific.  2. Well-circumscribed 3.6 x 1.8 cm intermediate attenuation lesion intimately associated with the proximal trachea and esophagus in the superior mediastinum, as above. 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 Less likely, this could represent an enlarged lymph node. Correlation with endoscopy is suggested for further evaluation in the near future.  3. 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 Correlation with nonemergent thyroid ultrasound is recommended in the near future. 4. Additional incidental findings, as above.  ASSESSMENT / PLAN:   1. Recently diagnosed right lower quadrant abdominal masses, seen on CT scan done 01/02/15 which reported a 17 mm nodule in the right lower abdomen immediately beneath the abdominal wall which is stable at 9 months. There is new adenopathy in the ileocolic mesenteric measuring 24 x 12 mm.  Had EGD on 12/27/14.  On 01/10/15, CT-guided biopsy of above masses, Pathology Report - DIAGNOSIS: A. PERITONEAL MASS, LOW ANTERIOR PELVIS; NEEDLE BIOPSY: SMOOTH MUSCLE CELL PROLIFERATION; NO CYTOLOGIC ATYPIA, INCREASED MITOTIC ACTIVITY, OR NECROSIS. CONSISTENT WITH LEIOMYOMA. SEE COMMENT.  B. PERITONEAL MASS, LOW ANTERIOR PELVIS; NEEDLE BIOPSY: NO EVIDENCE OF A LYMPHOPROLIFERATIVE PROLIFERATION/DISORDER -   have independently reviewed PET  scan and discussed films with patient, it shows only mild level metabolic activity in the masses reported on CT scan. Serum CEA and CA-125 level are normal. Serum chromogranin A is elevated at 31 grays in possibility that this could be carcinoid tumor. Have discussed with surgeon Dr. Tamala Julian on the phone, unclear at this time as to the definite indication of major surgical exploration. Octreotide scan on 04/02/15 does not shows increased activity in the abdominal masses seen on CT scan. Given this, plan at this time is continued surveillance and will see her back at 16 weeks with labs including chromogranin A level and pursue CT scan after she is seen here. Patient also prefers wait and watch  approach. 2. Right Thyroid abnormality on PET scan - seeing ENT. 3. Dysphagia - planned for EUS by [facility] here for difficulty swallowing and upper chest abnormality seen on recent CT/PET scan. 4. In between visits, patient advised to call or come to ER in case of any worsening symptoms or sickness. She is agreeable to this plan.      Leia Alf, MD   04/20/2015 10:16 AM

## 2015-04-24 ENCOUNTER — Ambulatory Visit: Payer: Medicare HMO | Admitting: Anesthesiology

## 2015-04-24 ENCOUNTER — Encounter
Admission: RE | Disposition: A | Discharge status: Home / Self Care | Payer: Self-pay | Source: Ambulatory Visit | Attending: Gastroenterology

## 2015-04-24 ENCOUNTER — Ambulatory Visit
Admission: RE | Admit: 2015-04-24 | Discharge: 2015-04-24 | Disposition: A | Discharge status: Home / Self Care | Payer: Medicare HMO | Source: Ambulatory Visit | Attending: Gastroenterology | Admitting: Gastroenterology

## 2015-04-24 ENCOUNTER — Ambulatory Visit: Admit: 2015-04-24 | Payer: Self-pay | Admitting: Internal Medicine

## 2015-04-24 DIAGNOSIS — R938 Abnormal findings on diagnostic imaging of other specified body structures: Secondary | ICD-10-CM | POA: Insufficient documentation

## 2015-04-24 DIAGNOSIS — Z794 Long term (current) use of insulin: Secondary | ICD-10-CM | POA: Diagnosis not present

## 2015-04-24 DIAGNOSIS — M1990 Unspecified osteoarthritis, unspecified site: Secondary | ICD-10-CM | POA: Diagnosis not present

## 2015-04-24 DIAGNOSIS — J985 Diseases of mediastinum, not elsewhere classified: Secondary | ICD-10-CM | POA: Insufficient documentation

## 2015-04-24 DIAGNOSIS — K449 Diaphragmatic hernia without obstruction or gangrene: Secondary | ICD-10-CM | POA: Insufficient documentation

## 2015-04-24 DIAGNOSIS — Z7951 Long term (current) use of inhaled steroids: Secondary | ICD-10-CM | POA: Diagnosis not present

## 2015-04-24 DIAGNOSIS — Z79899 Other long term (current) drug therapy: Secondary | ICD-10-CM | POA: Diagnosis not present

## 2015-04-24 DIAGNOSIS — I10 Essential (primary) hypertension: Secondary | ICD-10-CM | POA: Insufficient documentation

## 2015-04-24 DIAGNOSIS — I2510 Atherosclerotic heart disease of native coronary artery without angina pectoris: Secondary | ICD-10-CM | POA: Diagnosis not present

## 2015-04-24 DIAGNOSIS — K219 Gastro-esophageal reflux disease without esophagitis: Secondary | ICD-10-CM | POA: Diagnosis not present

## 2015-04-24 DIAGNOSIS — Z7982 Long term (current) use of aspirin: Secondary | ICD-10-CM | POA: Diagnosis not present

## 2015-04-24 DIAGNOSIS — E119 Type 2 diabetes mellitus without complications: Secondary | ICD-10-CM | POA: Insufficient documentation

## 2015-04-24 DIAGNOSIS — D649 Anemia, unspecified: Secondary | ICD-10-CM | POA: Diagnosis not present

## 2015-04-24 HISTORY — PX: EUS: SHX5427

## 2015-04-24 LAB — GLUCOSE, CAPILLARY: Glucose-Capillary: 102 mg/dL — ABNORMAL HIGH (ref 65–99)

## 2015-04-24 SURGERY — ESOPHAGEAL ENDOSCOPIC ULTRASOUND (EUS) RADIAL
Anesthesia: General

## 2015-04-24 SURGERY — ESOPHAGEAL ENDOSCOPIC ULTRASOUND (EUS) RADIAL
Anesthesia: Monitor Anesthesia Care

## 2015-04-24 MED ORDER — SODIUM CHLORIDE 0.9 % IV SOLN
INTRAVENOUS | Status: DC
  Administered 2015-04-24: 1000 mL via INTRAVENOUS

## 2015-04-24 MED ORDER — MIDAZOLAM HCL 2 MG/2ML IJ SOLN
INTRAMUSCULAR | Status: DC | PRN
  Administered 2015-04-24: 2 mg via INTRAVENOUS

## 2015-04-24 MED ORDER — LIDOCAINE HCL (CARDIAC) 20 MG/ML IV SOLN
INTRAVENOUS | Status: DC | PRN
  Administered 2015-04-24: 100 mg via INTRAVENOUS

## 2015-04-24 MED ORDER — FENTANYL CITRATE (PF) 100 MCG/2ML IJ SOLN
INTRAMUSCULAR | Status: DC | PRN
  Administered 2015-04-24: 50 ug via INTRAVENOUS

## 2015-04-24 MED ORDER — LIDOCAINE HCL (PF) 1 % IJ SOLN
INTRAMUSCULAR | Status: AC
  Administered 2015-04-24: 2 mL via INTRADERMAL
  Filled 2015-04-24: qty 2

## 2015-04-24 MED ORDER — EPHEDRINE SULFATE 50 MG/ML IJ SOLN
INTRAMUSCULAR | Status: DC | PRN
  Administered 2015-04-24 (×2): 10 mg via INTRAVENOUS

## 2015-04-24 MED ORDER — GLYCOPYRROLATE 0.2 MG/ML IJ SOLN
INTRAMUSCULAR | Status: DC | PRN
  Administered 2015-04-24: 0.2 mg via INTRAVENOUS

## 2015-04-24 MED ORDER — LIDOCAINE HCL (PF) 1 % IJ SOLN
2.0000 mL | Freq: Once | INTRAMUSCULAR | Status: AC
  Administered 2015-04-24: 2 mL via INTRADERMAL

## 2015-04-24 MED ORDER — PROPOFOL INFUSION 10 MG/ML OPTIME
INTRAVENOUS | Status: DC | PRN
  Administered 2015-04-24: 140 ug/kg/min via INTRAVENOUS

## 2015-04-24 NOTE — Transfer of Care (Signed)
Immediate Anesthesia Transfer of Care Note  Patient: Hailey Howard  Procedure(s) Performed: Procedure(s): ESOPHAGEAL ENDOSCOPIC ULTRASOUND (EUS) RADIAL (N/A)  Patient Location: PACU and Endoscopy Unit  Anesthesia Type:General  Level of Consciousness: sedated  Airway & Oxygen Therapy: Patient Spontanous Breathing and Patient connected to nasal cannula oxygen  Post-op Assessment: Report given to RN and Post -op Vital signs reviewed and stable  Post vital signs: Reviewed and stable  Last Vitals:  Filed Vitals:   04/24/15 1240  BP: 130/68  Pulse: 77  Temp: 36.6 C  Resp: 20    Complications: No apparent anesthesia complications

## 2015-04-24 NOTE — H&P (Signed)
Primary Care Physician:  Victory Dakin, MD  Pre-Procedure History & Physical: HPI:  Hailey Howard is a 59 y.o. female is here for an Upper EUS.   Past Medical History  Diagnosis Date  . Hypertension   . Diabetes mellitus without complication   . GERD (gastroesophageal reflux disease)   . Anemia   . Coronary artery disease   . Arthritis   . Dysrhythmia   . History of hiatal hernia   . Headache     Past Surgical History  Procedure Laterality Date  . Coronary artery bypass graft    . Cholecystectomy    . Abdominal hysterectomy    . Thyroidectomy, partial      Prior to Admission medications   Medication Sig Start Date End Date Taking? Authorizing Provider  amLODipine (NORVASC) 5 MG tablet Take 1 tablet by mouth daily. 04/19/14  Yes Historical Provider, MD  aspirin EC 81 MG tablet Take 1 tablet by mouth daily.   Yes Historical Provider, MD  Calcium Citrate-Vitamin D (CALCIUM CITRATE + D3 PO) Take 660 mg by mouth daily.  05/23/14  Yes Historical Provider, MD  Carboxymethylcellulose Sodium (GOODSENSE LUBRICATING EYE DROP OP) Apply 2 drops to eye daily.   Yes Historical Provider, MD  cholecalciferol (VITAMIN D) 1000 UNITS tablet Take 1 tablet by mouth daily. 06/03/14  Yes Historical Provider, MD  Denture Care Products (EFFERDENT DENTURE CLEANSER) TBEF Take 1 tablet by mouth daily. 02/06/15  Yes Historical Provider, MD  ferrous sulfate 325 (65 FE) MG tablet Take 325 mg by mouth daily with breakfast.   Yes Historical Provider, MD  fluticasone (FLONASE) 50 MCG/ACT nasal spray Place 2 sprays into both nostrils daily.   Yes Historical Provider, MD  furosemide (LASIX) 40 MG tablet Take 1 tablet by mouth daily. 01/03/14  Yes Historical Provider, MD  gabapentin (NEURONTIN) 300 MG capsule Take 1 capsule by mouth daily as needed (nerve pain).    Yes Historical Provider, MD  Insulin Glargine (LANTUS) 100 UNIT/ML Solostar Pen 50 Units every evening. 01/23/14  Yes Historical Provider, MD   Linaclotide Rolan Lipa) 290 MCG CAPS capsule Take 1 capsule by mouth daily. 03/04/15  Yes Historical Provider, MD  Liraglutide 18 MG/3ML SOPN Inject 1.8 mg into the skin daily.   Yes Historical Provider, MD  metFORMIN (GLUCOPHAGE-XR) 500 MG 24 hr tablet Take 500 mg by mouth daily.   Yes Historical Provider, MD  mineral oil-hydrophilic petrolatum (AQUAPHOR) ointment Apply topically as needed for dry skin.   Yes Historical Provider, MD  pantoprazole (PROTONIX) 40 MG tablet Take 1 tablet by mouth daily. 12/24/14  Yes Historical Provider, MD  potassium chloride SA (K-DUR,KLOR-CON) 20 MEQ tablet Take 1 tablet by mouth daily. 01/23/14  Yes Historical Provider, MD  promethazine (PHENERGAN) 25 MG tablet Take 25 mg by mouth every 6 (six) hours as needed for nausea or vomiting.   Yes Historical Provider, MD  quinapril-hydrochlorothiazide (ACCURETIC) 20-25 MG per tablet Take 1 tablet by mouth daily. 04/19/14  Yes Historical Provider, MD  rosuvastatin (CRESTOR) 40 MG tablet Take 40 mg by mouth daily.  02/14/14  Yes Historical Provider, MD  sotalol (BETAPACE) 80 MG tablet Take 1 tablet by mouth 2 (two) times daily. 04/19/14  Yes Historical Provider, MD  sucralfate (CARAFATE) 1 G tablet Take 1 tablet by mouth daily.    Yes Historical Provider, MD    Allergies as of 04/14/2015 - Review Complete 04/10/2015  Allergen Reaction Noted  . Other Anaphylaxis 04/03/2015  . Etodolac Itching 04/03/2015  .  Iodinated diagnostic agents Other (See Comments) 04/03/2015  . Oxycodone-acetaminophen Hives 04/03/2015    No family history on file.  History   Social History  . Marital Status: Single    Spouse Name: N/A  . Number of Children: N/A  . Years of Education: N/A   Occupational History  . Not on file.   Social History Main Topics  . Smoking status: Never Smoker   . Smokeless tobacco: Not on file  . Alcohol Use: No  . Drug Use: No  . Sexual Activity: Not on file   Other Topics Concern  . Not on file   Social  History Narrative    Review of Systems: See HPI, otherwise negative ROS  Physical Exam: BP 130/68 mmHg  Pulse 77  Temp(Src) 97.8 F (36.6 C) (Tympanic)  Resp 20  Ht 5\' 7"  (1.702 m)  Wt 100.699 kg (222 lb)  BMI 34.76 kg/m2 General:   Alert,  pleasant and cooperative in NAD Head:  Normocephalic and atraumatic. Neck:  Supple; no masses or thyromegaly. Lungs:  Clear throughout to auscultation.    Heart:  Regular rate and rhythm. Abdomen:  Soft, nontender and nondistended. Normal bowel sounds, without guarding, and without rebound.   Neurologic:  Alert and  oriented x4;  grossly normal neurologically.  Impression/Plan: Hailey Howard is here for an Upper EUS to be performed for abnormal chest CT.  Risks, benefits, limitations, and alternatives regarding  Upper EUS have been reviewed with the patient.  Questions have been answered.  All parties agreeable.   Cora Daniels, MD  04/24/2015, 12:58 PM

## 2015-04-24 NOTE — Anesthesia Preprocedure Evaluation (Signed)
Anesthesia Evaluation  Patient identified by MRN, date of birth, ID band Patient awake    Reviewed: Allergy & Precautions, NPO status , Patient's Chart, lab work & pertinent test results  History of Anesthesia Complications Negative for: history of anesthetic complications  Airway Mallampati: II  TM Distance: >3 FB Neck ROM: Full    Dental no notable dental hx. (+) Upper Dentures   Pulmonary neg pulmonary ROS,  breath sounds clear to auscultation  Pulmonary exam normal       Cardiovascular Exercise Tolerance: Good hypertension, Pt. on medications and Pt. on home beta blockers + CAD Normal cardiovascular exam+ dysrhythmias Supra Ventricular Tachycardia Rhythm:Regular Rate:Normal     Neuro/Psych  Headaches, negative psych ROS   GI/Hepatic Neg liver ROS, hiatal hernia, GERD-  Medicated and Controlled,  Endo/Other  diabetes, Well Controlled, Type 2, Insulin Dependent, Oral Hypoglycemic Agents  Renal/GU negative Renal ROS  negative genitourinary   Musculoskeletal  (+) Arthritis -, Osteoarthritis,    Abdominal   Peds negative pediatric ROS (+)  Hematology  (+) anemia ,   Anesthesia Other Findings   Reproductive/Obstetrics negative OB ROS                             Anesthesia Physical Anesthesia Plan  ASA: III  Anesthesia Plan: General   Post-op Pain Management:    Induction: Intravenous  Airway Management Planned: Nasal Cannula  Additional Equipment:   Intra-op Plan:   Post-operative Plan:   Informed Consent: I have reviewed the patients History and Physical, chart, labs and discussed the procedure including the risks, benefits and alternatives for the proposed anesthesia with the patient or authorized representative who has indicated his/her understanding and acceptance.   Dental advisory given  Plan Discussed with: CRNA and Surgeon  Anesthesia Plan Comments:          Anesthesia Quick Evaluation

## 2015-04-24 NOTE — Op Note (Addendum)
[facility] Gastroenterology Patient Name: Hailey Howard Procedure Date: 04/24/2015 1:13 PM MRN: [id number] Account #: [id number] Date of Birth: [birth year] Admit Type: Outpatient Age: 59 Room: [facility] ENDO ROOM 3 Gender: Female Note Status: Supervisor Override Procedure:         Upper EUS Indications:       Suspected mass in uppper mediastinum on chest CT Providers:         Christian Mate. Earlie Counts Referring MD:      Rhett Bannister. Ma Hillock, MD (Referring MD), Gerrit Heck. Rayann Heman, MD                     (Referring MD), Jackelyn Poling. Megan Salon (Referring MD) Medicines:         Monitored Anesthesia Care Complications:     No immediate complications. Procedure:         Pre-Anesthesia Assessment:                    - Prior to the procedure, a History and Physical was                     performed, and patient medications, allergies and                     sensitivities were reviewed. The patient's tolerance of                     previous anesthesia was reviewed.                    After obtaining informed consent, the endoscope was passed                     under direct vision. Throughout the procedure, the                     patient's blood pressure, pulse, and oxygen saturations                     were monitored continuously. The EUS GI Radial Array                     [id number] was introduced through the mouth, and advanced to                     the body of the stomach. The upper EUS was accomplished                     without difficulty. The patient tolerated the procedure                     well. Findings:      Endosonographic Finding :      Endosonographic imaging in the left lobe of the liver showed no mass.      No lymph nodes were seen during endosonographic examination in the       celiac region (level 20).      A single round lesion was found in the superior mediastinum. This lesion       was hyperechoic, heterogenous and mixed solid and cystic. It measured 27       mm by 27  mm in maximal cross-sectional diameter. It was closely adherent       to the posterior esophagus but not clearly arising from it. Impression:        -  A single hyperechoic, heterogenous and mixed solid and                     cystic lesion was found in the mediastinum. 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 FNA was not performed due to                     high risk of mediastinitis with FNA of cystic mediastinal                     lesions.                    - No specimens collected. Recommendation:    - Return to referring physician.                    - Suggest radiographic surveillance of the lesion (e.g.                     repeat neck/chest CT in 3-6 months).                    - Patient has a contact number available for emergencies.                     The signs and symptoms of potential delayed complications                     were discussed with the patient. Return to normal                     activities tomorrow. Written discharge instructions were                     provided to the patient.                    - The patient will be observed post-procedure, until all                     discharge criteria are met. Procedure Code(s): --- Professional ---                    [phone], 17, Esophagogastroduodenoscopy, flexible,                     transoral; with endoscopic ultrasound examination limited                     to the esophagus, stomach or duodenum, and adjacent                     structures Diagnosis Code(s): --- Professional ---                    R93.8, Abnormal findings on diagnostic imaging of other                     specified body structures                    J98.5, Diseases of mediastinum, not elsewhere classified CPT  copyright 2014 American Medical Association. All rights reserved. The codes documented in this report are preliminary and upon coder review may  be  revised to meet current compliance requirements. Attending Participation:      I personally performed the entire procedure without the assistance of a       fellow, resident or surgical assistant. Lindie Spruce,  04/24/2015 2:06:50 PM This report has been signed electronically. Number of Addenda: 0 Note Initiated On: 04/24/2015 1:13 PM      [employer]

## 2015-04-25 ENCOUNTER — Encounter: Payer: Self-pay | Admitting: Gastroenterology

## 2015-04-26 NOTE — Anesthesia Postprocedure Evaluation (Signed)
  Anesthesia Post-op Note  Patient: Hailey Howard Day  Procedure(s) Performed: Procedure(s): ESOPHAGEAL ENDOSCOPIC ULTRASOUND (EUS) RADIAL (N/A)  Anesthesia type:General  Patient location: PACU  Post pain: Pain level controlled  Post assessment: Post-op Vital signs reviewed, Patient's Cardiovascular Status Stable, Respiratory Function Stable, Patent Airway and No signs of Nausea or vomiting  Post vital signs: Reviewed and stable  Last Vitals:  Filed Vitals:   04/24/15 1430  BP: 112/68  Pulse: 79  Temp:   Resp: 11    Level of consciousness: awake, alert  and patient cooperative  Complications: No apparent anesthesia complications

## 2015-06-16 DIAGNOSIS — R609 Edema, unspecified: Secondary | ICD-10-CM | POA: Insufficient documentation

## 2015-07-30 ENCOUNTER — Telehealth: Payer: Self-pay

## 2015-07-30 ENCOUNTER — Inpatient Hospital Stay (HOSPITAL_BASED_OUTPATIENT_CLINIC_OR_DEPARTMENT_OTHER): Payer: Medicare HMO | Admitting: Internal Medicine

## 2015-07-30 ENCOUNTER — Inpatient Hospital Stay: Payer: Medicare HMO | Attending: Internal Medicine

## 2015-07-30 VITALS — BP 114/87 | HR 77 | Temp 96.6°F | Resp 18 | Ht 67.0 in | Wt 215.2 lb

## 2015-07-30 DIAGNOSIS — Z7982 Long term (current) use of aspirin: Secondary | ICD-10-CM | POA: Diagnosis not present

## 2015-07-30 DIAGNOSIS — R1310 Dysphagia, unspecified: Secondary | ICD-10-CM

## 2015-07-30 DIAGNOSIS — R1900 Intra-abdominal and pelvic swelling, mass and lump, unspecified site: Secondary | ICD-10-CM | POA: Diagnosis present

## 2015-07-30 DIAGNOSIS — I2510 Atherosclerotic heart disease of native coronary artery without angina pectoris: Secondary | ICD-10-CM | POA: Insufficient documentation

## 2015-07-30 DIAGNOSIS — E876 Hypokalemia: Secondary | ICD-10-CM | POA: Insufficient documentation

## 2015-07-30 DIAGNOSIS — Z951 Presence of aortocoronary bypass graft: Secondary | ICD-10-CM | POA: Insufficient documentation

## 2015-07-30 DIAGNOSIS — E119 Type 2 diabetes mellitus without complications: Secondary | ICD-10-CM | POA: Diagnosis not present

## 2015-07-30 DIAGNOSIS — Z79899 Other long term (current) drug therapy: Secondary | ICD-10-CM

## 2015-07-30 DIAGNOSIS — K219 Gastro-esophageal reflux disease without esophagitis: Secondary | ICD-10-CM | POA: Insufficient documentation

## 2015-07-30 DIAGNOSIS — I10 Essential (primary) hypertension: Secondary | ICD-10-CM | POA: Diagnosis not present

## 2015-07-30 DIAGNOSIS — Z794 Long term (current) use of insulin: Secondary | ICD-10-CM | POA: Insufficient documentation

## 2015-07-30 LAB — CBC WITH DIFFERENTIAL/PLATELET
Basophils Absolute: 0.1 10*3/uL (ref 0–0.1)
Basophils Relative: 1 %
EOS PCT: 2 %
Eosinophils Absolute: 0.2 10*3/uL (ref 0–0.7)
HEMATOCRIT: 36.7 % (ref 35.0–47.0)
HEMOGLOBIN: 12.2 g/dL (ref 12.0–16.0)
LYMPHS ABS: 2.7 10*3/uL (ref 1.0–3.6)
Lymphocytes Relative: 32 %
MCH: 26.7 pg (ref 26.0–34.0)
MCHC: 33.1 g/dL (ref 32.0–36.0)
MCV: 80.5 fL (ref 80.0–100.0)
Monocytes Absolute: 0.6 10*3/uL (ref 0.2–0.9)
Monocytes Relative: 7 %
NEUTROS ABS: 4.7 10*3/uL (ref 1.4–6.5)
Neutrophils Relative %: 58 %
PLATELETS: 294 10*3/uL (ref 150–440)
RBC: 4.56 MIL/uL (ref 3.80–5.20)
RDW: 14.3 % (ref 11.5–14.5)
WBC: 8.3 10*3/uL (ref 3.6–11.0)

## 2015-07-30 LAB — HEPATIC FUNCTION PANEL
ALBUMIN: 4 g/dL (ref 3.5–5.0)
ALT: 28 U/L (ref 14–54)
AST: 35 U/L (ref 15–41)
Alkaline Phosphatase: 72 U/L (ref 38–126)
Bilirubin, Direct: <0.1 mg/dL — ABNORMAL LOW (ref 0.1–0.5)
TOTAL PROTEIN: 7.8 g/dL (ref 6.5–8.1)
Total Bilirubin: 0.5 mg/dL (ref 0.3–1.2)

## 2015-07-30 LAB — POTASSIUM: Potassium: 2.1 mmol/L — CL (ref 3.5–5.1)

## 2015-07-30 LAB — CREATININE, SERUM
Creatinine, Ser: 1.47 mg/dL — ABNORMAL HIGH (ref 0.44–1.00)
GFR calc Af Amer: 43 mL/min — ABNORMAL LOW (ref >60)
GFR calc non Af Amer: 37 mL/min — ABNORMAL LOW (ref >60)

## 2015-07-30 MED ORDER — AZITHROMYCIN 250 MG PO TABS
ORAL_TABLET | ORAL | Status: DC
Start: 2015-07-30 — End: 2016-01-29

## 2015-07-30 NOTE — Telephone Encounter (Signed)
Per Dr. Ma Hillock asked to call in [place]. Escript was sent in to patient's pharmacy.

## 2015-07-30 NOTE — Progress Notes (Signed)
Patient states that she has been doing well and feels good. She states that she does have a little pain in her lower back today.

## 2015-07-31 ENCOUNTER — Telehealth: Payer: Self-pay | Admitting: *Deleted

## 2015-07-31 LAB — CHROMOGRANIN A: CHROMOGRANIN A: 33 nmol/L — AB (ref 0–5)

## 2015-07-31 NOTE — Telephone Encounter (Signed)
Pt had low potsssium and elev creat in clinic 9/7. Pt states that she has always for a long time had issues with potassium.  She takes potassium 20 meq each day.  Dr. Pandit advised her to take 3 a day x 3 days and then 2 a day x 4 days and then back to one a day.  Offered to do lab next week here but she lives closer to dr [facility] office.  Called their office and nurse will rtn call.  Did not here anything and called back 3 times today and finally able to get nurse marielena on phone and she will make appt for pt for lab only next wed for met b.  She will call pt with date and time.  i have called pt's home and spoke with family member who will let pt know that she will get phone call from [facility] for lab only next wed.  If any questions call me.  Also i faxed labs with above instructions given to pt to fax # [phone].  

## 2015-08-02 ENCOUNTER — Other Ambulatory Visit: Payer: Self-pay | Admitting: *Deleted

## 2015-08-02 ENCOUNTER — Telehealth: Payer: Self-pay | Admitting: *Deleted

## 2015-08-02 MED ORDER — PREDNISONE 50 MG PO TABS: ORAL_TABLET | ORAL | Status: DC

## 2015-08-02 NOTE — Telephone Encounter (Signed)
Hailey Howard states that in one place pt is allergic to iv dye contrast and she has anaphylactic reaction and the other place she has other but no comments entered.  I told her that I would have to call pt and verify and call  [facility] back # [phone].  I called pt only phone number and family member answered phone and I told her pt suppose to have scan mon. and we need to verify what kind of reaction pt has.  She states pt not home but she will give her the mess. I asked if pt had cell phone and she does but family member on the phone reluctant to give it to me so I asked her to call pt on the mobile phone and let her know how important it is to call me back.  No call ever rcvd so about 5:20 I called again at pt's home and pt answered the phone and said that she broke out in hives and itched because of the hives.  The recently had ct scan and she was not given anything special and was fine.  I tried calling Hailey Howard back but the office had already closed.  I came in Sat. And spoke to [facility] in [place] and he looked case up and she did have PET/CT but no contrast for ct part was given. The previous ct scan in feb was without IV contrast.  Pt also had a ct with contrast in May 2015.  Per Lennette Bihari as long as pt only had hives and itching then she should be fine to have ct with premeds.  I told him I would call it in.  I also paged Dr. Ma Hillock and discussed this with him and he wants to move forward with ct with contrast and call in premeds.  Called pt and let her know that we are going forward with scan but I would need to call in premeds to her pharmacy.  The 2 pharmacies she has listed is not open on weekend.  She states that I can call it into [facility] at [address],  she says the reaction she had a long time ago-several years ago before her heart surgery and she said they gave her IV benadryl and she was fine. I told her she will need to [facility] [place] location because she has had a  previous reaction in the past. Pt. States she has zantac and benadryl at home and I told her 1 benadryl 1 hour before scan and 1 150 mg zantac 1 hour before scan.  And she will go pick up rx at [place] for the prednisone and instructions will be on the bottle.  I did advise her that it can make sugar go up, it can make her hard time to sleep, can make pulse rate increase. It does not always affect people but wanted her to know poss. Side effects so if she feels that way she will know it is from the pills. She is agreeable to the above plan

## 2015-08-04 ENCOUNTER — Ambulatory Visit
Admission: RE | Admit: 2015-08-04 | Discharge: 2015-08-04 | Disposition: A | Discharge status: Home / Self Care | Payer: Medicare HMO | Source: Ambulatory Visit | Attending: Internal Medicine | Admitting: Internal Medicine

## 2015-08-04 ENCOUNTER — Ambulatory Visit: Admission: RE | Admit: 2015-08-04 | Payer: Medicare HMO | Source: Ambulatory Visit

## 2015-08-04 DIAGNOSIS — I2510 Atherosclerotic heart disease of native coronary artery without angina pectoris: Secondary | ICD-10-CM | POA: Insufficient documentation

## 2015-08-04 DIAGNOSIS — E049 Nontoxic goiter, unspecified: Secondary | ICD-10-CM | POA: Diagnosis not present

## 2015-08-04 DIAGNOSIS — R1900 Intra-abdominal and pelvic swelling, mass and lump, unspecified site: Secondary | ICD-10-CM | POA: Diagnosis present

## 2015-08-04 LAB — SEROTONIN SERUM: SEROTONIN, SERUM: 23 ng/mL (ref 0–420)

## 2015-08-04 MED ORDER — IOHEXOL 300 MG/ML  SOLN
50.0000 mL | Freq: Once | INTRAMUSCULAR | Status: AC | PRN
  Administered 2015-08-04: 50 mL via INTRAVENOUS

## 2015-08-05 ENCOUNTER — Telehealth: Payer: Self-pay | Admitting: *Deleted

## 2015-08-05 NOTE — Telephone Encounter (Signed)
Thinks she is having side effects form the prednisone. I attempted to call her for more information, but had to leave a message on vm

## 2015-08-05 NOTE — Telephone Encounter (Signed)
I called and left her a message that if she could call me back and let me know what sx. She is having.  I did tell her that it can make her sugar go up, make her nervous, sometimes cause trouble sleeping.  She was taking 3 pills for premed due to hives that she breaks out in when taking IV contrast dye.  It should go out of her system in 1-2 days and she took her last on at 10:30 yest.  I did advise drink lots of fluids to flush it out and if she could call me back I could speak with her about the exact sx she is having.

## 2015-08-07 NOTE — Telephone Encounter (Signed)
I called again today to check on pt and there was no answer and I left message that I hope she was feeling better with her sugar and she can call me and let me know or if she was having any other problems to let me know and left her [place] number to call

## 2015-08-11 ENCOUNTER — Telehealth: Payer: Self-pay | Admitting: *Deleted

## 2015-08-11 NOTE — Telephone Encounter (Signed)
Calling to let pt know that dr pandit did not feel comfortable sending another atb back to back together and rec: that she have appt with ENT.. She states that she coughed up some light green sputum, sore throat and sometimes scratchy.  She does have some sinus issues but she does not believe it is linked together with her throat.  she was agreeable to ENT and I called and got her appt for wed 9/213:15 and pt ok with this appt and I need to fax notes [phone]

## 2015-08-14 NOTE — Progress Notes (Signed)
[place]  Telephone:([phone]) [phone] Fax:(336) [phone]     ID: Hailey Howard Day OB: Jul 26, 1956  MR#: [id number]  [id number]  Patient Care Team: Victory Dakin, MD as PCP - General (Internal Medicine)  CHIEF COMPLAINT/DIAGNOSIS:  1.  Two right lower quadrant abdominal masses, seen on CT scan done 01/02/15 which reported a 17 mm nodule in the right lower abdomen immediately beneath the abdominal wall which is stable at 9 months. There is new adenopathy in the ileocolic mesenteric measuring 24 x 12 mm. Had EGD on 12/27/14. 01/10/15 - CT-guided biopsy of above masses, Pathology Report - DIAGNOSIS: A. PERITONEAL MASS, LOW ANTERIOR PELVIS; NEEDLE BIOPSY: SMOOTH MUSCLE CELL PROLIFERATION; NO CYTOLOGIC ATYPIA, INCREASED MITOTIC ACTIVITY, OR NECROSIS. CONSISTENT WITH LEIOMYOMA. SEE COMMENT.   B. PERITONEAL MASS, LOW ANTERIOR PELVIS; NEEDLE BIOPSY: NO EVIDENCE OF A LYMPHOPROLIFERATIVE PROLIFERATION/DISORDER. SEE FLOW CYTOMETRY REPORT WHICH HAS BEEN SCANNED INTO THE MEDICAL RECORD. -  patient under monitoring.  2. PET scan April 2016 also reported mediastinal mass (Well-circumscribed 3.6 x 1.8 cm intermediate attenuation lesion intimately associated with the proximal trachea and esophagus in the superior mediastinum, as above. 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  Report of EUS 04/24/15 by GI Dr. Earlie Counts -  suspect represents a cystic lesion (duplication cyst?) with degenerated material within. Recommended repeat CT scan of the neck/chest in 3-6 months.     HISTORY OF PRESENT ILLNESS:  Patient returns for continued oncology follow-up, she had endoscopic ultrasound in June as described above. Overall states that she is doing about the same. States that she is trying to eat more healthy, she has lost about 7 pounds since June, denies any major swallowing issues. Last labs in April showed elevated  serum chromogranin A at 31. States that nausea and vomiting is better, diarrhea has also improved, and denies any blood in stools or melena. She had an EGD done on February 5 which reported no malignancy, there were changes suggestive of early fundic gland polyp, negative for H. pylori, dysplasia, or malignancy. Colonoscopy Dec 2014 with removal of 4 polyps (tubular adenomas). Chronic arthritis is same, no new bone pains. Denies any dyspnea, wheezing or symptoms of congestive heart failure.    REVIEW OF SYSTEMS:   ROS As in HPI above. In addition, no fevers or night sweats. No new headaches or focal weakness. No sore throat. No new cough, sputum or hemoptysis. Denies abdominal pain. No dysuria or hematuria.  No new skin rash or bleeding symptoms. No new paresthesias in extremities. No polyuria polydipsia.   PAST MEDICAL HISTORY: Past Medical History  Diagnosis Date  . Hypertension   . Diabetes mellitus without complication   . GERD (gastroesophageal reflux disease)   . Anemia   . Coronary artery disease   . Arthritis   . Dysrhythmia   . History of hiatal hernia   . Headache           Diabetes mellitus  Hypertension  GERD  Anemia  Coronary artery disease, s/p CABG surgery  Arthritis  Cholecystectomy  Partial hysterectomy, ovaries spared  Partial thyroidectomy for reportedly benign nodule   PAST SURGICAL HISTORY: Past Surgical History  Procedure Laterality Date  . Coronary artery bypass graft    . Cholecystectomy    . Abdominal hysterectomy    . Thyroidectomy, partial    . Eus N/A 04/24/2015    Procedure: ESOPHAGEAL ENDOSCOPIC ULTRASOUND (EUS) RADIAL;  Surgeon: Legrand Como  Charisse Klinefelter, MD;  Location: [facility] ENDOSCOPY;  Service: Endoscopy;  Laterality: N/A;    FAMILY HISTORY:  remarkable for hypertension, diabetes, heart disease. Mother had colon cancer about age 42. Brother had stomach cancer. Denies other malignancies.   SOCIAL HISTORY: Social History  Substance Use Topics  .  Smoking status: Never Smoker   . Smokeless tobacco: Not on file  . Alcohol Use: No  Denies smoking, alcohol or recreational drug usage. Physically active.  Allergies  Allergen Reactions  . Other Hives and Itching    Iv dye  Confirmed with pt over the phone on 08/01/15  . Etodolac Itching  . Iodinated Diagnostic Agents Other (See Comments)    Hives and itching  . Oxycodone-Acetaminophen Hives    Current Outpatient Prescriptions  Medication Sig Dispense Refill  . amLODipine (NORVASC) 5 MG tablet Take 1 tablet by mouth daily.    Marland Kitchen aspirin EC 81 MG tablet Take 1 tablet by mouth daily.    . Calcium Citrate-Vitamin D (CALCIUM CITRATE + D3 PO) Take 660 mg by mouth daily.     . Carboxymethylcellulose Sodium (GOODSENSE LUBRICATING EYE DROP OP) Apply 2 drops to eye daily.    . cholecalciferol (VITAMIN D) 1000 UNITS tablet Take 1 tablet by mouth daily.    . Denture Care Products (EFFERDENT DENTURE CLEANSER) TBEF Take 1 tablet by mouth daily.    . ferrous sulfate 325 (65 FE) MG tablet Take 325 mg by mouth daily with breakfast.    . fluticasone (FLONASE) 50 MCG/ACT nasal spray Place 2 sprays into both nostrils daily.    . furosemide (LASIX) 40 MG tablet Take 1 tablet by mouth daily.    Marland Kitchen gabapentin (NEURONTIN) 300 MG capsule Take 1 capsule by mouth daily as needed (nerve pain).     . Insulin Glargine (LANTUS) 100 UNIT/ML Solostar Pen 50 Units every evening.    . Linaclotide (LINZESS) 290 MCG CAPS capsule Take 1 capsule by mouth daily.    Marland Kitchen linagliptin (TRADJENTA) 5 MG TABS tablet Take by mouth.    . Liraglutide 18 MG/3ML SOPN Inject 1.8 mg into the skin daily.    . metFORMIN (GLUCOPHAGE-XR) 500 MG 24 hr tablet Take 500 mg by mouth daily.    . mineral oil-hydrophilic petrolatum (AQUAPHOR) ointment Apply topically as needed for dry skin.    . pantoprazole (PROTONIX) 40 MG tablet Take 1 tablet by mouth daily.    . potassium chloride SA (K-DUR,KLOR-CON) 20 MEQ tablet Take 1 tablet by mouth daily.      . promethazine (PHENERGAN) 25 MG tablet Take 25 mg by mouth every 6 (six) hours as needed for nausea or vomiting.    . quinapril-hydrochlorothiazide (ACCURETIC) 20-25 MG per tablet Take 1 tablet by mouth daily.    . rosuvastatin (CRESTOR) 40 MG tablet Take 40 mg by mouth daily.     . sotalol (BETAPACE) 80 MG tablet Take 1 tablet by mouth 2 (two) times daily.    . sucralfate (CARAFATE) 1 G tablet Take 1 tablet by mouth daily.     Marland Kitchen azithromycin (ZITHROMAX Z-PAK) 250 MG tablet Take 2 tabs on day one and then one tab daily for four days. 6 each 0  . predniSONE (DELTASONE) 50 MG tablet 1 tablet at 10 pm on Sunday 9/11, 1 tablet 4 am on Monday 9/12 and 1 tablet 10:30 am Monday 9/12 3 tablet 0   No current facility-administered medications for this visit.    OBJECTIVE: Filed Vitals:   07/30/15 1450  BP: 114/87  Pulse: 77  Temp: 96.6 F (35.9 C)  Resp: 18     Body mass index is 33.69 kg/(m^2).      GENERAL: Alert and oriented and in no acute distress. There is no icterus. HEENT: EOMs intact. No oral thrush. No cervical adenopathy. CVS: S1-S2, regular LUNGS: Bilaterally clear to auscultation, no rhonchi. ABDOMEN: Soft, nontender. No hepatosplenomegaly clinically.  EXTREMITIES: No pedal edema.   LAB RESULTS:    Component Value Date/Time   NA 136 03/10/2015 1640   K 2.1* 07/30/2015 1431   K 2.7* 03/10/2015 1640   CL 96* 03/10/2015 1640   CO2 31 03/10/2015 1640   GLUCOSE 99 03/10/2015 1640   BUN 16 03/10/2015 1640   CREATININE 1.47* 07/30/2015 1431   CREATININE 1.18* 03/10/2015 1640   CALCIUM 9.0 03/10/2015 1640   PROT 7.8 07/30/2015 1431   PROT 7.7 03/10/2015 1640   ALBUMIN 4.0 07/30/2015 1431   ALBUMIN 4.1 03/10/2015 1640   AST 35 07/30/2015 1431   AST 34 03/10/2015 1640   ALT 28 07/30/2015 1431   ALT 39 03/10/2015 1640   ALKPHOS 72 07/30/2015 1431   ALKPHOS 60 03/10/2015 1640   BILITOT 0.5 07/30/2015 1431   BILITOT 0.4 03/10/2015 1640   GFRNONAA 37* 07/30/2015 1431    GFRNONAA 50* 03/10/2015 1640   GFRNONAA 24* 12/27/2014 0909   GFRAA 43* 07/30/2015 1431   GFRAA 58* 03/10/2015 1640   GFRAA 29* 12/27/2014 0909   Lab Results  Component Value Date   WBC 8.3 07/30/2015   NEUTROABS 4.7 07/30/2015   HGB 12.2 07/30/2015   HCT 36.7 07/30/2015   MCV 80.5 07/30/2015   PLT 294 07/30/2015   07/30/15 - serum chromogranin A is 33 (was 31 in April 2016). Serum serotonin normal at 23 (ref range 0-420).  STUDIES: 12/27/14 - EGD and biopsy - DIAGNOSIS: A.SMALL BOWEL AND DUODENAL BULB; BIOPSY: DUODENAL MUCOSA WITH PROMINENT BRUNNER'S GLANDS AND REACTIVE OVERLYING VILLOUS CHANGE. FEATURES OF CELIAC DISEASE ARE NOT IDENTIFIED. NEGATIVE FOR DYSPLASIA AND MALIGNANCY.    B. STOMACH, BODY; BIOPSY: OXYNTIC MUCOSA WITH FOCAL CHANGES SUGGESTIVE OF EARLY FUNDIC GLAND POLYP. NEGATIVE FOR H. PYLORI, DYSPLASIA, AND MALIGNANCY.    C. STOMACH, ANTRUM; BIOPSY: ANTRAL MUCOSA WITH REACTIVE FOVEOLAR HYPERPLASIA, COMPATIBLE WITH HEALING MUCOSAL INJURY. NEGATIVE FOR H. PYLORI, DYSPLASIA, AND MALIGNANCY.  01/02/15 - CT scan of abdomen/pelvis without contrast. IMPRESSION: 17 mm mass in the right lower quadrant of uncertain origin. Although the mass does not appear aggressive, it is most likely neoplastic and there is new ileocolic adenopathy. The nodule is beneath the abdominal wall and could likely be accessed percutaneously.  03/10/15 - serum CEA normal at 2.6, CA-125 level normal at 10.7. Serum chromogranin A is elevated at 31, serum serotonin normal range at 129.  03/17/15 - PET scan. IMPRESSION:  1. Small soft tissue attenuation nodule in the superficial aspect of the right lower quadrant of the abdomen/pelvis is similar in size in appearance to prior examinations, and demonstrates only low-level metabolic activity. Likewise, the mildly enlarged ileocolic lymph node previously noted also demonstrates only low-level metabolic activity. These findings are nonspecific.  2. Well-circumscribed 3.6 x 1.8  cm intermediate attenuation lesion intimately associated with the proximal trachea and esophagus in the superior mediastinum, as above. 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 Less likely, this could represent an enlarged lymph node. Correlation with endoscopy is suggested for further evaluation in the  near future.  3. 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 Correlation with nonemergent thyroid ultrasound is recommended in the near future. 4. Additional incidental findings, as above.  Report of EUS 04/24/15 by GI Dr. Earlie Counts -  suspect represents a cystic lesion (duplication cyst?) with degenerated material within. Recommended repeat CT scan of the neck/chest in 3-6 months.  ASSESSMENT / PLAN:   1. Two right lower quadrant abdominal masses, seen on CT scan done 01/02/15 which reported a 17 mm nodule in the right lower abdomen immediately beneath the abdominal wall which is stable at 9 months. There is new adenopathy in the ileocolic mesenteric measuring 24 x 12 mm.  Had EGD on 12/27/14.  On 01/10/15, CT-guided biopsy of above masses, Pathology Report - DIAGNOSIS: A. PERITONEAL MASS, LOW ANTERIOR PELVIS; NEEDLE BIOPSY: SMOOTH MUSCLE CELL PROLIFERATION; NO CYTOLOGIC ATYPIA, INCREASED MITOTIC ACTIVITY, OR NECROSIS. CONSISTENT WITH LEIOMYOMA. SEE COMMENT.  B. PERITONEAL MASS, LOW ANTERIOR PELVIS; NEEDLE BIOPSY: NO EVIDENCE OF A LYMPHOPROLIFERATIVE PROLIFERATION/DISORDER. PET scan had reported only mild level metabolic activity in the masses reported on CT scan. Serum CEA and CA-125 level are normal. Serum chromogranin A currently remains steady at 33, indicates possibility that this could be carcinoid tumor. Octreotide scan on 04/02/15 did not show increased activity in the abdominal masses seen on CT scan. Patient does not have any symptoms of carcinoid syndrome.  Have encouraged her to eat better and increase protein calorie intake and notify us if she has continued weight loss. Given this, plan at this time is continued surveillance. We will schedule for surveillance CT scan of the chest abdomen pelvis next week on September 12, otherwise if CT scan does not show any changes then plan is continued monitoring and will see her back at 24 weeks with labs including chromogranin A level. Patient also prefers wait and watch approach. 2. Right Thyroid abnormality on PET scan - seeing ENT. 3. Dysphagia - clinically better. She had EUS by [facility] also feels that it might be duplication cyst. We will follow up on CT scan next week and if any changes will consider further evaluation  4. Hypokalemia - will supplement potassium, and request PMD follow-up next week. 5. In between visits, patient advised to call or come to ER in case of any new/worsening symptoms or sickness. She is agreeable to this plan.      Leia Alf, MD   08/14/2015 3:54 PM

## 2015-09-08 DIAGNOSIS — I454 Nonspecific intraventricular block: Secondary | ICD-10-CM | POA: Insufficient documentation

## 2015-09-10 DIAGNOSIS — E876 Hypokalemia: Secondary | ICD-10-CM | POA: Insufficient documentation

## 2015-09-10 DIAGNOSIS — N183 Chronic kidney disease, stage 3 unspecified: Secondary | ICD-10-CM | POA: Insufficient documentation

## 2015-09-19 DIAGNOSIS — E1142 Type 2 diabetes mellitus with diabetic polyneuropathy: Secondary | ICD-10-CM

## 2015-09-19 DIAGNOSIS — E119 Type 2 diabetes mellitus without complications: Secondary | ICD-10-CM | POA: Insufficient documentation

## 2015-09-19 DIAGNOSIS — E1165 Type 2 diabetes mellitus with hyperglycemia: Secondary | ICD-10-CM | POA: Insufficient documentation

## 2015-09-19 HISTORY — DX: Type 2 diabetes mellitus with diabetic polyneuropathy: E11.42

## 2015-10-23 ENCOUNTER — Other Ambulatory Visit: Payer: Self-pay | Admitting: Family Medicine

## 2015-10-23 DIAGNOSIS — Z1231 Encounter for screening mammogram for malignant neoplasm of breast: Secondary | ICD-10-CM

## 2015-12-08 ENCOUNTER — Ambulatory Visit
Admission: RE | Admit: 2015-12-08 | Discharge: 2015-12-08 | Disposition: A | Discharge status: Home / Self Care | Payer: Medicare HMO | Source: Ambulatory Visit | Attending: Family Medicine | Admitting: Family Medicine

## 2015-12-08 DIAGNOSIS — Z1231 Encounter for screening mammogram for malignant neoplasm of breast: Secondary | ICD-10-CM | POA: Insufficient documentation

## 2015-12-19 ENCOUNTER — Emergency Department
Admission: EM | Admit: 2015-12-19 | Discharge: 2015-12-19 | Disposition: A | Discharge status: Home / Self Care | Payer: Medicare HMO | Attending: Emergency Medicine | Admitting: Emergency Medicine

## 2015-12-19 ENCOUNTER — Encounter: Payer: Self-pay | Admitting: Emergency Medicine

## 2015-12-19 DIAGNOSIS — Z794 Long term (current) use of insulin: Secondary | ICD-10-CM | POA: Diagnosis not present

## 2015-12-19 DIAGNOSIS — Z7982 Long term (current) use of aspirin: Secondary | ICD-10-CM | POA: Diagnosis not present

## 2015-12-19 DIAGNOSIS — R9431 Abnormal electrocardiogram [ECG] [EKG]: Secondary | ICD-10-CM | POA: Diagnosis not present

## 2015-12-19 DIAGNOSIS — R531 Weakness: Secondary | ICD-10-CM | POA: Insufficient documentation

## 2015-12-19 DIAGNOSIS — R42 Dizziness and giddiness: Secondary | ICD-10-CM | POA: Diagnosis present

## 2015-12-19 DIAGNOSIS — R609 Edema, unspecified: Secondary | ICD-10-CM | POA: Diagnosis not present

## 2015-12-19 DIAGNOSIS — Z79899 Other long term (current) drug therapy: Secondary | ICD-10-CM | POA: Diagnosis not present

## 2015-12-19 DIAGNOSIS — N189 Chronic kidney disease, unspecified: Secondary | ICD-10-CM | POA: Insufficient documentation

## 2015-12-19 DIAGNOSIS — Z7952 Long term (current) use of systemic steroids: Secondary | ICD-10-CM | POA: Diagnosis not present

## 2015-12-19 DIAGNOSIS — Z7984 Long term (current) use of oral hypoglycemic drugs: Secondary | ICD-10-CM | POA: Diagnosis not present

## 2015-12-19 DIAGNOSIS — I129 Hypertensive chronic kidney disease with stage 1 through stage 4 chronic kidney disease, or unspecified chronic kidney disease: Secondary | ICD-10-CM | POA: Insufficient documentation

## 2015-12-19 DIAGNOSIS — Z792 Long term (current) use of antibiotics: Secondary | ICD-10-CM | POA: Diagnosis not present

## 2015-12-19 DIAGNOSIS — R5383 Other fatigue: Secondary | ICD-10-CM | POA: Insufficient documentation

## 2015-12-19 DIAGNOSIS — E119 Type 2 diabetes mellitus without complications: Secondary | ICD-10-CM | POA: Insufficient documentation

## 2015-12-19 LAB — COMPREHENSIVE METABOLIC PANEL
ALBUMIN: 3.5 g/dL (ref 3.5–5.0)
ALT: 21 U/L (ref 14–54)
ANION GAP: 10 (ref 5–15)
AST: 24 U/L (ref 15–41)
Alkaline Phosphatase: 93 U/L (ref 38–126)
BILIRUBIN TOTAL: 0.6 mg/dL (ref 0.3–1.2)
BUN: 19 mg/dL (ref 6–20)
CO2: 25 mmol/L (ref 22–32)
Calcium: 8.8 mg/dL — ABNORMAL LOW (ref 8.9–10.3)
Chloride: 108 mmol/L (ref 101–111)
Creatinine, Ser: 0.89 mg/dL (ref 0.44–1.00)
GFR calc Af Amer: >60 mL/min (ref >60)
Glucose, Bld: 101 mg/dL — ABNORMAL HIGH (ref 65–99)
POTASSIUM: 3.7 mmol/L (ref 3.5–5.1)
Sodium: 143 mmol/L (ref 135–145)
TOTAL PROTEIN: 7.6 g/dL (ref 6.5–8.1)

## 2015-12-19 LAB — CBC
HEMATOCRIT: 31.4 % — AB (ref 35.0–47.0)
Hemoglobin: 10 g/dL — ABNORMAL LOW (ref 12.0–16.0)
MCH: 25.7 pg — ABNORMAL LOW (ref 26.0–34.0)
MCHC: 31.9 g/dL — AB (ref 32.0–36.0)
MCV: 80.6 fL (ref 80.0–100.0)
Platelets: 276 10*3/uL (ref 150–440)
RBC: 3.9 MIL/uL (ref 3.80–5.20)
RDW: 16.2 % — AB (ref 11.5–14.5)
WBC: 8.2 10*3/uL (ref 3.6–11.0)

## 2015-12-19 LAB — TROPONIN I: TROPONIN I: 0.03 ng/mL (ref <0.031)

## 2015-12-19 NOTE — ED Provider Notes (Signed)
[facility] Emergency Department Provider Note  Time seen: 10:45 AM  I have reviewed the triage vital signs and the nursing notes.   HISTORY  Chief Complaint Dizziness and Doctor sent for EKG     HPI Hailey Howard is a 60 y.o. female with a past medical history of hypertension, diabetes, gastric reflux, chronic kidney disease, who presents the emergency department for an abnormal EKG. According to the patient for the past several weeks she is felt intermittent fatigue, weakness, dizziness. Patient was seen by her nephrologist was adjusting her blood pressure medications per patient. She was seen by her primary care doctor today as a routine follow-up of was found to have an abnormal EKG and given her symptoms of weakness she was sent to the emergency department for evaluation. Patient has a cardiologist, Dr. Nehemiah Massed, with a follow-up appointment scheduled for Wednesday. Denies any chest pain now or at any time the last few weeks. Denies shortness of breath. Denies nausea or vomiting.Denies diaphoresis.     Past Medical History  Diagnosis Date  . Hypertension   . Diabetes mellitus without complication ([place])   . GERD (gastroesophageal reflux disease)   . Anemia   . Coronary artery disease   . Arthritis   . Dysrhythmia   . History of hiatal hernia   . Headache     There are no active problems to display for this patient.   Past Surgical History  Procedure Laterality Date  . Coronary artery bypass graft    . Cholecystectomy    . Abdominal hysterectomy    . Thyroidectomy, partial    . Eus N/A 04/24/2015    Procedure: ESOPHAGEAL ENDOSCOPIC ULTRASOUND (EUS) RADIAL;  Surgeon: Cora Daniels, MD;  Location: [facility] ENDOSCOPY;  Service: Endoscopy;  Laterality: N/A;    Current Outpatient Rx  Name  Route  Sig  Dispense  Refill  . amLODipine (NORVASC) 5 MG tablet   Oral   Take 1 tablet by mouth daily.         Marland Kitchen aspirin EC 81 MG tablet   Oral   Take  1 tablet by mouth daily.         Marland Kitchen azithromycin (ZITHROMAX Z-PAK) 250 MG tablet      Take 2 tabs on Howard one and then one tab daily for four days.   6 each   0   . Calcium Citrate-Vitamin D (CALCIUM CITRATE + D3 PO)   Oral   Take 660 mg by mouth daily.          . Carboxymethylcellulose Sodium (GOODSENSE LUBRICATING EYE DROP OP)   Ophthalmic   Apply 2 drops to eye daily.         . cholecalciferol (VITAMIN D) 1000 UNITS tablet   Oral   Take 1 tablet by mouth daily.         . Denture Care Products (EFFERDENT DENTURE CLEANSER) TBEF   Oral   Take 1 tablet by mouth daily.           Dispense as written.   . ferrous sulfate 325 (65 FE) MG tablet   Oral   Take 325 mg by mouth daily with breakfast.         . fluticasone (FLONASE) 50 MCG/ACT nasal spray   Each Nare   Place 2 sprays into both nostrils daily.         . furosemide (LASIX) 40 MG tablet   Oral   Take 1 tablet by mouth  daily.         . gabapentin (NEURONTIN) 300 MG capsule   Oral   Take 1 capsule by mouth daily as needed (nerve pain).          . Insulin Glargine (LANTUS) 100 UNIT/ML Solostar Pen      50 Units every evening.         . Linaclotide (LINZESS) 290 MCG CAPS capsule   Oral   Take 1 capsule by mouth daily.         Marland Kitchen linagliptin (TRADJENTA) 5 MG TABS tablet   Oral   Take by mouth.         . Liraglutide 18 MG/3ML SOPN   Subcutaneous   Inject 1.8 mg into the skin daily.         . metFORMIN (GLUCOPHAGE-XR) 500 MG 24 hr tablet   Oral   Take 500 mg by mouth daily.         . mineral oil-hydrophilic petrolatum (AQUAPHOR) ointment   Topical   Apply topically as needed for dry skin.         . pantoprazole (PROTONIX) 40 MG tablet   Oral   Take 1 tablet by mouth daily.         . potassium chloride SA (K-DUR,KLOR-CON) 20 MEQ tablet   Oral   Take 1 tablet by mouth daily.         . predniSONE (DELTASONE) 50 MG tablet      1 tablet at 10 pm on Sunday 9/11, 1 tablet  4 am on Monday 9/12 and 1 tablet 10:30 am Monday 9/12   3 tablet   0   . promethazine (PHENERGAN) 25 MG tablet   Oral   Take 25 mg by mouth every 6 (six) hours as needed for nausea or vomiting.         . quinapril-hydrochlorothiazide (ACCURETIC) 20-25 MG per tablet   Oral   Take 1 tablet by mouth daily.         . rosuvastatin (CRESTOR) 40 MG tablet   Oral   Take 40 mg by mouth daily.          . sotalol (BETAPACE) 80 MG tablet   Oral   Take 1 tablet by mouth 2 (two) times daily.         . sucralfate (CARAFATE) 1 G tablet   Oral   Take 1 tablet by mouth daily.            Allergies Other; Etodolac; Iodinated diagnostic agents; and Oxycodone-acetaminophen  History reviewed. No pertinent family history.  Social History Social History  Substance Use Topics  . Smoking status: Never Smoker   . Smokeless tobacco: None  . Alcohol Use: No    Review of Systems Constitutional: Negative for fever. Intermittent dizziness/weakness Cardiovascular: Negative for chest pain. Respiratory: Negative for shortness of breath. Gastrointestinal: Negative for abdominal pain Genitourinary: Negative for dysuria. Neurological: Negative for headaches, focal weakness or numbness. 10-point ROS otherwise negative.  ____________________________________________   PHYSICAL EXAM:  VITAL SIGNS: ED Triage Vitals  Enc Vitals Group     BP 12/19/15 1018 173/69 mmHg     Pulse Rate 12/19/15 1018 54     Resp 12/19/15 1018 24     Temp --      Temp src --      SpO2 12/19/15 1018 100 %     Weight 12/19/15 1018 231 lb (104.781 kg)     Height 12/19/15 1018 5\' 7"  (1.702  m)     Head Cir --      Peak Flow --      Pain Score --      Pain Loc --      Pain Edu? --      Excl. in [place]? --     Constitutional: Alert and oriented. Well appearing and in no distress. Eyes: Normal exam ENT   Head: Normocephalic and atraumatic.   Mouth/Throat: Mucous membranes are moist. Cardiovascular:  Normal rate, regular rhythm. No murmur Respiratory: Normal respiratory effort without tachypnea nor retractions. Breath sounds are clear  Gastrointestinal: Soft and nontender. No distention.   Musculoskeletal: Nontender with normal range of motion in all extremities. Minimal left lower extremity edema which she states is normal since her CABG in 2002. Nontender calves. Neurologic:  Normal speech and language. No gross focal neurologic deficits Skin:  Skin is warm, dry and intact.  Psychiatric: Mood and affect are normal. Speech and behavior are normal.   ____________________________________________    EKG  EKG reviewed and interpreted by myself shows normal sinus rhythm at 57 bpm, slightly widened QRS, left axis deviation, prolonged QTC of 516, nonspecific ST changes. 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  ____________________________________________   INITIAL IMPRESSION / ASSESSMENT AND PLAN / ED COURSE  Pertinent labs & imaging results that were available during my care of the patient were reviewed by me and considered in my medical decision making (see chart for details).  Patient presents to the emergency department 2 weeks of intermittent dizziness, and generalized fatigue/weakness, and an abnormal EKG at her primary care doctor today. Unfortunately we are not able to view her old EKGs. Today's EKG does not appear acutely concerning, it appears most consistent with a bifascicular block. I was able to at least read an EKG interpretation Dr. Nehemiah Massed in 2015 which showed a right bundle branch block. This could be progression RBBB. However given her symptoms of the past 2 weeks we will check labs including cardiac enzymes, and closely monitor in the emergency department. Patient agreeable to plan.   Patient's labs are within normal limits. We will have the patient follow-up with Dr. Nehemiah Massed. Patient will be discharged  home.   ____________________________________________   FINAL CLINICAL IMPRESSION(S) / ED DIAGNOSES  Weakness   Harvest Dark, MD 12/19/15 1306

## 2015-12-19 NOTE — ED Notes (Signed)
Pt verbalized understanding of discharge instructions. NAD at this time. 

## 2015-12-19 NOTE — ED Notes (Signed)
Pt arrived by EMS from PCP. PCP concerned about EKG ("inverted T wave").  Pt has been suffering from dizziness since Tues night and says that she has been working with PCP to find a BP medication that will maintain her kidney function.

## 2015-12-19 NOTE — Discharge Instructions (Signed)

## 2015-12-24 DIAGNOSIS — R9431 Abnormal electrocardiogram [ECG] [EKG]: Secondary | ICD-10-CM | POA: Insufficient documentation

## 2016-01-13 ENCOUNTER — Encounter: Payer: Self-pay | Admitting: *Deleted

## 2016-01-13 ENCOUNTER — Other Ambulatory Visit: Payer: Self-pay | Admitting: *Deleted

## 2016-01-13 DIAGNOSIS — K6389 Other specified diseases of intestine: Secondary | ICD-10-CM

## 2016-01-14 ENCOUNTER — Encounter: Payer: Self-pay | Admitting: *Deleted

## 2016-01-14 ENCOUNTER — Inpatient Hospital Stay: Payer: Medicare HMO

## 2016-01-14 ENCOUNTER — Ambulatory Visit: Payer: Medicare HMO | Admitting: Internal Medicine

## 2016-01-14 ENCOUNTER — Inpatient Hospital Stay: Payer: Medicare HMO | Admitting: Internal Medicine

## 2016-01-14 NOTE — Progress Notes (Signed)
No show 01/14/16 with Dr. Rogue Bussing today. Msg sent to cancer center scheduling to r/s this apt

## 2016-01-29 ENCOUNTER — Encounter: Payer: Self-pay | Admitting: Internal Medicine

## 2016-01-29 ENCOUNTER — Telehealth: Payer: Self-pay | Admitting: *Deleted

## 2016-01-29 ENCOUNTER — Inpatient Hospital Stay (HOSPITAL_BASED_OUTPATIENT_CLINIC_OR_DEPARTMENT_OTHER): Payer: Medicare HMO | Admitting: Internal Medicine

## 2016-01-29 ENCOUNTER — Inpatient Hospital Stay: Payer: Medicare HMO | Attending: Internal Medicine

## 2016-01-29 VITALS — BP 178/85 | HR 61 | Temp 98.2°F | Ht 67.7 in | Wt 235.9 lb

## 2016-01-29 DIAGNOSIS — M1990 Unspecified osteoarthritis, unspecified site: Secondary | ICD-10-CM | POA: Diagnosis not present

## 2016-01-29 DIAGNOSIS — K6389 Other specified diseases of intestine: Secondary | ICD-10-CM

## 2016-01-29 DIAGNOSIS — Z79899 Other long term (current) drug therapy: Secondary | ICD-10-CM

## 2016-01-29 DIAGNOSIS — Z794 Long term (current) use of insulin: Secondary | ICD-10-CM | POA: Diagnosis not present

## 2016-01-29 DIAGNOSIS — I10 Essential (primary) hypertension: Secondary | ICD-10-CM | POA: Diagnosis not present

## 2016-01-29 DIAGNOSIS — I2510 Atherosclerotic heart disease of native coronary artery without angina pectoris: Secondary | ICD-10-CM | POA: Diagnosis not present

## 2016-01-29 DIAGNOSIS — R1900 Intra-abdominal and pelvic swelling, mass and lump, unspecified site: Secondary | ICD-10-CM | POA: Diagnosis present

## 2016-01-29 DIAGNOSIS — K219 Gastro-esophageal reflux disease without esophagitis: Secondary | ICD-10-CM | POA: Diagnosis not present

## 2016-01-29 DIAGNOSIS — Z951 Presence of aortocoronary bypass graft: Secondary | ICD-10-CM | POA: Insufficient documentation

## 2016-01-29 DIAGNOSIS — E876 Hypokalemia: Secondary | ICD-10-CM | POA: Insufficient documentation

## 2016-01-29 DIAGNOSIS — E119 Type 2 diabetes mellitus without complications: Secondary | ICD-10-CM | POA: Diagnosis not present

## 2016-01-29 DIAGNOSIS — R1310 Dysphagia, unspecified: Secondary | ICD-10-CM | POA: Diagnosis not present

## 2016-01-29 DIAGNOSIS — Z7982 Long term (current) use of aspirin: Secondary | ICD-10-CM | POA: Diagnosis not present

## 2016-01-29 LAB — CBC WITH DIFFERENTIAL/PLATELET
BASOS PCT: 1 %
Basophils Absolute: 0 10*3/uL (ref 0–0.1)
EOS ABS: 0.3 10*3/uL (ref 0–0.7)
EOS PCT: 5 %
HCT: 34.7 % — ABNORMAL LOW (ref 35.0–47.0)
HEMOGLOBIN: 11.3 g/dL — AB (ref 12.0–16.0)
Lymphocytes Relative: 29 %
Lymphs Abs: 1.5 10*3/uL (ref 1.0–3.6)
MCH: 25.6 pg — ABNORMAL LOW (ref 26.0–34.0)
MCHC: 32.7 g/dL (ref 32.0–36.0)
MCV: 78.4 fL — ABNORMAL LOW (ref 80.0–100.0)
MONO ABS: 0.6 10*3/uL (ref 0.2–0.9)
MONOS PCT: 12 %
NEUTROS PCT: 53 %
Neutro Abs: 2.8 10*3/uL (ref 1.4–6.5)
PLATELETS: 237 10*3/uL (ref 150–440)
RBC: 4.42 MIL/uL (ref 3.80–5.20)
RDW: 16.7 % — AB (ref 11.5–14.5)
WBC: 5.2 10*3/uL (ref 3.6–11.0)

## 2016-01-29 LAB — COMPREHENSIVE METABOLIC PANEL
ALBUMIN: 3.7 g/dL (ref 3.5–5.0)
ALT: 22 U/L (ref 14–54)
ANION GAP: 8 (ref 5–15)
AST: 28 U/L (ref 15–41)
Alkaline Phosphatase: 93 U/L (ref 38–126)
BUN: 16 mg/dL (ref 6–20)
CO2: 29 mmol/L (ref 22–32)
Calcium: 7.9 mg/dL — ABNORMAL LOW (ref 8.9–10.3)
Chloride: 102 mmol/L (ref 101–111)
Creatinine, Ser: 1.02 mg/dL — ABNORMAL HIGH (ref 0.44–1.00)
GFR calc Af Amer: >60 mL/min (ref >60)
GFR calc non Af Amer: 58 mL/min — ABNORMAL LOW (ref >60)
GLUCOSE: 97 mg/dL (ref 65–99)
POTASSIUM: 2.8 mmol/L — AB (ref 3.5–5.1)
SODIUM: 139 mmol/L (ref 135–145)
Total Bilirubin: 0.5 mg/dL (ref 0.3–1.2)
Total Protein: 7.4 g/dL (ref 6.5–8.1)

## 2016-01-29 MED ORDER — POTASSIUM CHLORIDE CRYS ER 20 MEQ PO TBCR: 40.0000 meq | EXTENDED_RELEASE_TABLET | Freq: Once | ORAL | Status: DC

## 2016-01-29 NOTE — Telephone Encounter (Signed)
Dr. B had told pt that her potassium was low in the office and he was going to figure out what dose she needs of potassium.Marland Kitchen He entered an order for 40 meq every day and it was sent to her pharmacy.  When I called her house her daughter states that she is not at home now but she will give her a message.  I told her that Dr. B wanted her to start back on potassium 40 meq and it has already been sent to her pharmacy .  We are also sending the level and the potassium we sent in to her doctor who is managing her htn.  Dr. Smith Mince office.  She will let her mom know when she comes home. Faxed the potassium level and wrote on it the amount of potassium called in for the cont. Monitoring from dr Smith Mince office could cont.

## 2016-01-29 NOTE — Progress Notes (Signed)
Patient ambulates without assistance, patient denies pain or discomfort.  Patient's medication record updated, information provided by patient.

## 2016-01-29 NOTE — Progress Notes (Signed)
[place] OFFICE PROGRESS NOTE  Patient Care Team: Herminio Commons, MD as PCP - General (Family Medicine)   SUMMARY OF ONCOLOGIC HISTORY:  # Anterior mesenteric nodule- 16x3mm [sep 2016; smaller; Feb 2016- S/p Bx- leiomyoma.]; [place] [address] [id number]; sep [id number]; improving].   # Retro-tracheal duplication cyst/ dysphagia   INTERVAL HISTORY:  This is my first interaction with the patient since I joined the practice September 2016. I reviewed the patient's prior charts/pertinent labs/imaging in detail; findings are summarized above.   Overall patient is doing well. Denies any unusual nausea vomiting or diarrhea. Denies any chest pain or shortness of breath or cough. She has no swelling in the legs. No abdominal pain no nausea no vomiting.  She was recently Started on potassium supplementation; and she has been off it. Weeks ago when the potassium levels were normal.   REVIEW OF SYSTEMS:  A complete 10 point review of system is done which is negative except mentioned above/history of present illness.   PAST MEDICAL HISTORY :  Past Medical History  Diagnosis Date  . Hypertension   . Diabetes mellitus without complication ([place])   . GERD (gastroesophageal reflux disease)   . Anemia   . Coronary artery disease   . Arthritis   . Dysrhythmia   . History of hiatal hernia   . Headache   . Mesenteric mass     PAST SURGICAL HISTORY :   Past Surgical History  Procedure Laterality Date  . Coronary artery bypass graft    . Cholecystectomy    . Abdominal hysterectomy    . Thyroidectomy, partial    . Eus N/A 04/24/2015    Procedure: ESOPHAGEAL ENDOSCOPIC ULTRASOUND (EUS) RADIAL;  Surgeon: Cora Daniels, MD;  Location: [facility] ENDOSCOPY;  Service: Endoscopy;  Laterality: N/A;    FAMILY HISTORY :  History reviewed. No pertinent family history.  SOCIAL HISTORY:   Social History  Substance Use Topics  . Smoking status: Never Smoker   . Smokeless tobacco: Never Used   . Alcohol Use: No    ALLERGIES:  is allergic to other; etodolac; iodinated diagnostic agents; and oxycodone-acetaminophen.  MEDICATIONS:  Current Outpatient Prescriptions  Medication Sig Dispense Refill  . aspirin EC 81 MG tablet Take 1 tablet by mouth daily.    . Calcium Citrate-Vitamin D (CALCIUM CITRATE + D3 PO) Take 660 mg by mouth daily.     . Carboxymethylcellulose Sodium (GOODSENSE LUBRICATING EYE DROP OP) Apply 2 drops to eye daily.    . cholecalciferol (VITAMIN D) 1000 UNITS tablet Take 1 tablet by mouth daily.    . Denture Care Products (EFFERDENT DENTURE CLEANSER) TBEF Take 1 tablet by mouth daily.    . ferrous sulfate 325 (65 FE) MG tablet Take 325 mg by mouth daily with breakfast.    . fluticasone (FLONASE) 50 MCG/ACT nasal spray Place 2 sprays into both nostrils daily.    . furosemide (LASIX) 40 MG tablet Take 1 tablet by mouth daily.    Marland Kitchen gabapentin (NEURONTIN) 300 MG capsule Take 1 capsule by mouth daily as needed (nerve pain).     . Insulin Glargine (LANTUS) 100 UNIT/ML Solostar Pen 50 Units every evening.    . Linaclotide (LINZESS) 290 MCG CAPS capsule Take 1 capsule by mouth daily.    Marland Kitchen linagliptin (TRADJENTA) 5 MG TABS tablet Take by mouth.    . Liraglutide 18 MG/3ML SOPN Inject 1.8 mg into the skin daily.    . metFORMIN (GLUCOPHAGE-XR) 500 MG 24 hr tablet  Take 500 mg by mouth daily.    . mineral oil-hydrophilic petrolatum (AQUAPHOR) ointment Apply topically as needed for dry skin.    . promethazine (PHENERGAN) 25 MG tablet Take 25 mg by mouth every 6 (six) hours as needed for nausea or vomiting.    . quinapril-hydrochlorothiazide (ACCURETIC) 20-25 MG per tablet Take 1 tablet by mouth daily.    . rosuvastatin (CRESTOR) 40 MG tablet Take 40 mg by mouth daily.     . sotalol (BETAPACE) 80 MG tablet Take 1 tablet by mouth 2 (two) times daily.     No current facility-administered medications for this visit.    PHYSICAL EXAMINATION: ECOG PERFORMANCE STATUS: 0 -  Asymptomatic  BP 178/85 mmHg  Pulse 61  Temp(Src) 98.2 F (36.8 C) (Tympanic)  Ht 5' 7.7" (1.72 m)  Wt 235 lb 14.3 oz (107 kg)  BMI 36.17 kg/m2  Filed Weights   01/29/16 1446  Weight: 235 lb 14.3 oz (107 kg)    GENERAL: Well-nourished well-developed; Alert, no distress and comfortable.   Alone. EYES: no pallor or icterus OROPHARYNX: no thrush or ulceration; good dentition  NECK: supple, no masses felt LYMPH:  no palpable lymphadenopathy in the cervical, axillary or inguinal regions LUNGS: clear to auscultation and  No wheeze or crackles HEART/CVS: regular rate & rhythm and no murmurs; No lower extremity edema ABDOMEN:abdomen soft, non-tender and normal bowel sounds Musculoskeletal:no cyanosis of digits and no clubbing  PSYCH: alert & oriented x 3 with fluent speech NEURO: no focal motor/sensory deficits SKIN:  no rashes or significant lesions  LABORATORY DATA:  I have reviewed the data as listed    Component Value Date/Time   NA 143 12/19/2015 1148   NA 136 03/10/2015 1640   K 3.7 12/19/2015 1148   K 2.7* 03/10/2015 1640   CL 108 12/19/2015 1148   CL 96* 03/10/2015 1640   CO2 25 12/19/2015 1148   CO2 31 03/10/2015 1640   GLUCOSE 101* 12/19/2015 1148   GLUCOSE 99 03/10/2015 1640   BUN 19 12/19/2015 1148   BUN 16 03/10/2015 1640   CREATININE 0.89 12/19/2015 1148   CREATININE 1.18* 03/10/2015 1640   CALCIUM 8.8* 12/19/2015 1148   CALCIUM 9.0 03/10/2015 1640   PROT 7.6 12/19/2015 1148   PROT 7.7 03/10/2015 1640   ALBUMIN 3.5 12/19/2015 1148   ALBUMIN 4.1 03/10/2015 1640   AST 24 12/19/2015 1148   AST 34 03/10/2015 1640   ALT 21 12/19/2015 1148   ALT 39 03/10/2015 1640   ALKPHOS 93 12/19/2015 1148   ALKPHOS 60 03/10/2015 1640   BILITOT 0.6 12/19/2015 1148   BILITOT 0.4 03/10/2015 1640   GFRNONAA >60 12/19/2015 1148   GFRNONAA 50* 03/10/2015 1640   GFRNONAA 24* 12/27/2014 0909   GFRAA >60 12/19/2015 1148   GFRAA 58* 03/10/2015 1640   GFRAA 29* 12/27/2014  0909    No results found for: SPEP, UPEP  Lab Results  Component Value Date   WBC 5.2 01/29/2016   NEUTROABS 2.8 01/29/2016   HGB 11.3* 01/29/2016   HCT 34.7* 01/29/2016   MCV 78.4* 01/29/2016   PLT 237 01/29/2016      Chemistry      Component Value Date/Time   NA 143 12/19/2015 1148   NA 136 03/10/2015 1640   K 3.7 12/19/2015 1148   K 2.7* 03/10/2015 1640   CL 108 12/19/2015 1148   CL 96* 03/10/2015 1640   CO2 25 12/19/2015 1148   CO2 31 03/10/2015 1640  BUN 19 12/19/2015 1148   BUN 16 03/10/2015 1640   CREATININE 0.89 12/19/2015 1148   CREATININE 1.18* 03/10/2015 1640      Component Value Date/Time   CALCIUM 8.8* 12/19/2015 1148   CALCIUM 9.0 03/10/2015 1640   ALKPHOS 93 12/19/2015 1148   ALKPHOS 60 03/10/2015 1640   AST 24 12/19/2015 1148   AST 34 03/10/2015 1640   ALT 21 12/19/2015 1148   ALT 39 03/10/2015 1640   BILITOT 0.6 12/19/2015 1148   BILITOT 0.4 03/10/2015 1640        ASSESSMENT & PLAN:   # Anterior mesenteric nodule- 16x63mm [sep 2016; smaller]; [place] [address] [address]56; sep [id number]; improving]. S/p Bx- leiomyoma. With regards to follow-up on the anterior mesenteric nodule/ileocolic lymph node- I would await on the chromogranin levels from today. If elevated I would recommend a CT scan now; otherwise if steady/decreasing- I would recommend a CT scan in 6 months from now.  # Severe hypokalemia- question related to antihypertensive. Patient prescribed potassium 40 mEq once a day for 2 weeks. We will fax a copy of the labs to nephrologist's office; patient will follow-up with nephrologist office regarding recheck her potassium.  # patient follow-up with me in approximately 6 months/ labs in few days prior.       Cammie Sickle, MD 01/29/2016 2:51 PM

## 2016-01-30 LAB — CHROMOGRANIN A: Chromogranin A: 17 nmol/L — ABNORMAL HIGH (ref 0–5)

## 2016-02-02 ENCOUNTER — Encounter: Payer: Self-pay | Admitting: Internal Medicine

## 2016-02-02 ENCOUNTER — Other Ambulatory Visit: Payer: Self-pay | Admitting: *Deleted

## 2016-02-02 DIAGNOSIS — K6389 Other specified diseases of intestine: Secondary | ICD-10-CM

## 2016-02-23 ENCOUNTER — Ambulatory Visit
Admission: RE | Admit: 2016-02-23 | Discharge: 2016-02-23 | Disposition: A | Discharge status: Home / Self Care | Payer: Medicare HMO | Source: Ambulatory Visit | Attending: Family Medicine | Admitting: Family Medicine

## 2016-02-23 ENCOUNTER — Other Ambulatory Visit: Payer: Self-pay | Admitting: Family Medicine

## 2016-02-23 DIAGNOSIS — M79671 Pain in right foot: Secondary | ICD-10-CM | POA: Diagnosis present

## 2016-02-23 DIAGNOSIS — M7731 Calcaneal spur, right foot: Secondary | ICD-10-CM | POA: Insufficient documentation

## 2016-05-07 DIAGNOSIS — M7582 Other shoulder lesions, left shoulder: Secondary | ICD-10-CM | POA: Insufficient documentation

## 2016-05-07 DIAGNOSIS — M7542 Impingement syndrome of left shoulder: Secondary | ICD-10-CM | POA: Insufficient documentation

## 2016-05-07 DIAGNOSIS — M7552 Bursitis of left shoulder: Secondary | ICD-10-CM | POA: Insufficient documentation

## 2016-06-14 ENCOUNTER — Encounter: Payer: Self-pay | Admitting: Internal Medicine

## 2016-06-16 ENCOUNTER — Encounter: Payer: Self-pay | Admitting: Internal Medicine

## 2016-06-21 ENCOUNTER — Other Ambulatory Visit: Payer: Self-pay | Admitting: *Deleted

## 2016-06-21 DIAGNOSIS — Z91041 Radiographic dye allergy status: Secondary | ICD-10-CM

## 2016-06-21 MED ORDER — PREDNISONE 50 MG PO TABS: ORAL_TABLET | ORAL | 0 refills | Status: DC

## 2016-07-22 ENCOUNTER — Other Ambulatory Visit: Payer: Medicare HMO

## 2016-07-23 ENCOUNTER — Telehealth: Payer: Self-pay | Admitting: *Deleted

## 2016-07-23 ENCOUNTER — Other Ambulatory Visit: Payer: Self-pay | Admitting: *Deleted

## 2016-07-23 DIAGNOSIS — Z91041 Radiographic dye allergy status: Secondary | ICD-10-CM

## 2016-07-23 MED ORDER — RANITIDINE HCL 150 MG PO TABS: 150.0000 mg | ORAL_TABLET | Freq: Two times a day (BID) | ORAL | 0 refills | Status: DC

## 2016-07-23 MED ORDER — PREDNISONE 50 MG PO TABS: ORAL_TABLET | ORAL | 0 refills | Status: DC

## 2016-07-23 MED ORDER — DIPHENHYDRAMINE HCL 25 MG PO CAPS: 25.0000 mg | ORAL_CAPSULE | Freq: Once | ORAL | 0 refills | Status: DC

## 2016-07-23 NOTE — Telephone Encounter (Signed)
-----   Message from Cephus Richer sent at 07/23/2016  9:06 AM EDT ----- Pt need premeds in order to get scan . Please call meds into pharmacy.

## 2016-07-23 NOTE — Telephone Encounter (Signed)
Dye allergy prep sent to pharmacy.  Attempted to reach patient. All phone numbers are inactive. Unable to reach patient.

## 2016-07-27 ENCOUNTER — Other Ambulatory Visit: Payer: Medicare HMO

## 2016-07-27 ENCOUNTER — Ambulatory Visit: Admission: RE | Admit: 2016-07-27 | Payer: Medicare HMO | Source: Ambulatory Visit

## 2016-07-27 ENCOUNTER — Ambulatory Visit
Admission: RE | Admit: 2016-07-27 | Discharge: 2016-07-27 | Disposition: A | Discharge status: Home / Self Care | Payer: Medicare HMO | Source: Ambulatory Visit | Attending: Internal Medicine | Admitting: Internal Medicine

## 2016-07-27 ENCOUNTER — Ambulatory Visit: Payer: Medicare HMO

## 2016-07-27 ENCOUNTER — Inpatient Hospital Stay: Payer: Medicare HMO | Attending: Internal Medicine

## 2016-07-27 ENCOUNTER — Other Ambulatory Visit: Payer: Self-pay

## 2016-07-27 DIAGNOSIS — R1310 Dysphagia, unspecified: Secondary | ICD-10-CM | POA: Insufficient documentation

## 2016-07-27 DIAGNOSIS — Z79899 Other long term (current) drug therapy: Secondary | ICD-10-CM | POA: Insufficient documentation

## 2016-07-27 DIAGNOSIS — I10 Essential (primary) hypertension: Secondary | ICD-10-CM | POA: Insufficient documentation

## 2016-07-27 DIAGNOSIS — E876 Hypokalemia: Secondary | ICD-10-CM

## 2016-07-27 DIAGNOSIS — K219 Gastro-esophageal reflux disease without esophagitis: Secondary | ICD-10-CM | POA: Diagnosis not present

## 2016-07-27 DIAGNOSIS — R1900 Intra-abdominal and pelvic swelling, mass and lump, unspecified site: Secondary | ICD-10-CM | POA: Diagnosis not present

## 2016-07-27 DIAGNOSIS — K668 Other specified disorders of peritoneum: Secondary | ICD-10-CM | POA: Diagnosis not present

## 2016-07-27 DIAGNOSIS — Z9049 Acquired absence of other specified parts of digestive tract: Secondary | ICD-10-CM | POA: Insufficient documentation

## 2016-07-27 DIAGNOSIS — Z7982 Long term (current) use of aspirin: Secondary | ICD-10-CM | POA: Insufficient documentation

## 2016-07-27 DIAGNOSIS — I700 Atherosclerosis of aorta: Secondary | ICD-10-CM | POA: Insufficient documentation

## 2016-07-27 DIAGNOSIS — Z951 Presence of aortocoronary bypass graft: Secondary | ICD-10-CM | POA: Diagnosis not present

## 2016-07-27 DIAGNOSIS — Z794 Long term (current) use of insulin: Secondary | ICD-10-CM | POA: Insufficient documentation

## 2016-07-27 DIAGNOSIS — K6389 Other specified diseases of intestine: Secondary | ICD-10-CM

## 2016-07-27 DIAGNOSIS — I2510 Atherosclerotic heart disease of native coronary artery without angina pectoris: Secondary | ICD-10-CM | POA: Insufficient documentation

## 2016-07-27 DIAGNOSIS — M1990 Unspecified osteoarthritis, unspecified site: Secondary | ICD-10-CM | POA: Diagnosis not present

## 2016-07-27 DIAGNOSIS — E119 Type 2 diabetes mellitus without complications: Secondary | ICD-10-CM | POA: Insufficient documentation

## 2016-07-27 LAB — COMPREHENSIVE METABOLIC PANEL
ALK PHOS: 118 U/L (ref 38–126)
ALT: 20 U/L (ref 14–54)
ANION GAP: 10 (ref 5–15)
AST: 22 U/L (ref 15–41)
Albumin: 4 g/dL (ref 3.5–5.0)
BUN: 25 mg/dL — ABNORMAL HIGH (ref 6–20)
CALCIUM: 8.6 mg/dL — AB (ref 8.9–10.3)
CO2: 24 mmol/L (ref 22–32)
Chloride: 102 mmol/L (ref 101–111)
Creatinine, Ser: 1.24 mg/dL — ABNORMAL HIGH (ref 0.44–1.00)
GFR calc non Af Amer: 46 mL/min — ABNORMAL LOW (ref >60)
GFR, EST AFRICAN AMERICAN: 53 mL/min — AB (ref >60)
Glucose, Bld: 438 mg/dL — ABNORMAL HIGH (ref 65–99)
POTASSIUM: 3.4 mmol/L — AB (ref 3.5–5.1)
SODIUM: 136 mmol/L (ref 135–145)
TOTAL PROTEIN: 8.2 g/dL — AB (ref 6.5–8.1)
Total Bilirubin: 0.5 mg/dL (ref 0.3–1.2)

## 2016-07-27 LAB — CBC
HCT: 37.6 % (ref 35.0–47.0)
Hemoglobin: 12.3 g/dL (ref 12.0–16.0)
MCH: 27.1 pg (ref 26.0–34.0)
MCHC: 32.7 g/dL (ref 32.0–36.0)
MCV: 83 fL (ref 80.0–100.0)
PLATELETS: 284 10*3/uL (ref 150–440)
RBC: 4.53 MIL/uL (ref 3.80–5.20)
RDW: 15.4 % — ABNORMAL HIGH (ref 11.5–14.5)
WBC: 12.4 10*3/uL — AB (ref 3.6–11.0)

## 2016-07-27 MED ORDER — IOPAMIDOL (ISOVUE-300) INJECTION 61%
100.0000 mL | Freq: Once | INTRAVENOUS | Status: AC | PRN
  Administered 2016-07-27: 100 mL via INTRAVENOUS

## 2016-07-28 LAB — CHROMOGRANIN A: Chromogranin A: 13 nmol/L — ABNORMAL HIGH (ref 0–5)

## 2016-07-29 ENCOUNTER — Ambulatory Visit: Payer: Medicare HMO | Admitting: Internal Medicine

## 2016-07-29 ENCOUNTER — Other Ambulatory Visit: Payer: Medicare HMO

## 2016-08-02 ENCOUNTER — Ambulatory Visit: Payer: Medicare HMO | Admitting: Internal Medicine

## 2016-08-02 ENCOUNTER — Inpatient Hospital Stay (HOSPITAL_BASED_OUTPATIENT_CLINIC_OR_DEPARTMENT_OTHER): Payer: Medicare HMO | Admitting: Internal Medicine

## 2016-08-02 ENCOUNTER — Encounter: Payer: Self-pay | Admitting: Internal Medicine

## 2016-08-02 DIAGNOSIS — R1310 Dysphagia, unspecified: Secondary | ICD-10-CM | POA: Diagnosis not present

## 2016-08-02 DIAGNOSIS — E876 Hypokalemia: Secondary | ICD-10-CM

## 2016-08-02 DIAGNOSIS — Z79899 Other long term (current) drug therapy: Secondary | ICD-10-CM | POA: Diagnosis not present

## 2016-08-02 DIAGNOSIS — R1900 Intra-abdominal and pelvic swelling, mass and lump, unspecified site: Secondary | ICD-10-CM

## 2016-08-02 DIAGNOSIS — R000 Tachycardia, unspecified: Secondary | ICD-10-CM | POA: Insufficient documentation

## 2016-08-02 DIAGNOSIS — E785 Hyperlipidemia, unspecified: Secondary | ICD-10-CM | POA: Insufficient documentation

## 2016-08-02 DIAGNOSIS — K6389 Other specified diseases of intestine: Secondary | ICD-10-CM | POA: Insufficient documentation

## 2016-08-02 DIAGNOSIS — I2510 Atherosclerotic heart disease of native coronary artery without angina pectoris: Secondary | ICD-10-CM | POA: Insufficient documentation

## 2016-08-02 DIAGNOSIS — I10 Essential (primary) hypertension: Secondary | ICD-10-CM | POA: Insufficient documentation

## 2016-08-02 NOTE — Assessment & Plan Note (Addendum)
#   Anterior mesenteric nodule- 16x62mm [sep 2016; smaller]; [place] [4XQ; sep 8208; improving]. S/p Bx- leiomyoma. Re: anterior mesenteric nodule/ileocolic lymph node- SEP 1388- NED. Recommend repeating scan- in 1 year; if negative then discontinue scans.   # Severe hypokalemia- question related to antihypertensive; today-  normal [[place]; nep]  # Elevated Blood glucose- refer to PCP.   # patient follow-up with me in approximately 1 year/ labs in few days prior.

## 2016-08-02 NOTE — Progress Notes (Signed)
Pt reports having CT scan last Tuesday.  No other concerns

## 2016-08-02 NOTE — Progress Notes (Signed)
[place] OFFICE PROGRESS NOTE  Patient Care Team: Herminio Commons, MD as PCP - General (Family Medicine)   SUMMARY OF ONCOLOGIC HISTORY:  # Anterior mesenteric nodule- 16x29mm [sep 2016; smaller; Feb 2016- S/p Bx- leiomyoma.]; Ilecolic LN [2VO; sep 5366; improving]. CT SEP 2017- NED [elevated chromogranin  Levels- slightly]  # Retro-tracheal duplication cyst/ dysphagia   INTERVAL HISTORY:  Patient is chronic back pain which is not any worse.  Denies any unusual nausea vomiting or diarrhea. Denies any chest pain or shortness of breath or cough. She has no swelling in the legs. No abdominal pain no nausea no vomiting.   REVIEW OF SYSTEMS:  A complete 10 point review of system is done which is negative except mentioned above/history of present illness.   PAST MEDICAL HISTORY :  Past Medical History:  Diagnosis Date  . Anemia   . Arthritis   . Coronary artery disease   . Diabetes mellitus without complication ([place])   . DM type 2 with diabetic peripheral neuropathy ([place]) 09/19/2015  . Dysrhythmia   . GERD (gastroesophageal reflux disease)   . Headache   . History of hiatal hernia   . Hypertension   . Mesenteric mass     PAST SURGICAL HISTORY :   Past Surgical History:  Procedure Laterality Date  . ABDOMINAL HYSTERECTOMY    . CHOLECYSTECTOMY    . CORONARY ARTERY BYPASS GRAFT    . EUS N/A 04/24/2015   Procedure: ESOPHAGEAL ENDOSCOPIC ULTRASOUND (EUS) RADIAL;  Surgeon: Cora Daniels, MD;  Location: [facility] ENDOSCOPY;  Service: Endoscopy;  Laterality: N/A;  . THYROIDECTOMY, PARTIAL      FAMILY HISTORY :  History reviewed. No pertinent family history.  SOCIAL HISTORY:   Social History  Substance Use Topics  . Smoking status: Never Smoker  . Smokeless tobacco: Never Used  . Alcohol use No    ALLERGIES:  is allergic to other; etodolac; iodinated diagnostic agents; and oxycodone-acetaminophen.  MEDICATIONS:  Current Outpatient Prescriptions   Medication Sig Dispense Refill  . amLODipine (NORVASC) 5 MG tablet Take 2.5 mg by mouth.    Marland Kitchen aspirin EC 81 MG tablet Take 1 tablet by mouth daily.    . Calcium Citrate-Vitamin D (CALCIUM CITRATE + D3 PO) Take 660 mg by mouth daily.     . Carboxymethylcellulose Sodium (GOODSENSE LUBRICATING EYE DROP OP) Apply 2 drops to eye daily.    . cholecalciferol (VITAMIN D) 1000 UNITS tablet Take 1 tablet by mouth daily.    . Denture Care Products (EFFERDENT DENTURE CLEANSER) TBEF Take 1 tablet by mouth daily.    . ferrous sulfate 325 (65 FE) MG tablet Take 325 mg by mouth daily with breakfast.    . fluticasone (FLONASE) 50 MCG/ACT nasal spray Place 2 sprays into both nostrils daily.    . furosemide (LASIX) 40 MG tablet Take 1 tablet by mouth daily.    Marland Kitchen gabapentin (NEURONTIN) 300 MG capsule Take 1 capsule by mouth daily as needed (nerve pain).     . insulin aspart (NOVOLOG FLEXPEN) 100 UNIT/ML FlexPen Take by mouth.    . Insulin Glargine (LANTUS) 100 UNIT/ML Solostar Pen 50 Units every evening.    . Linaclotide (LINZESS) 290 MCG CAPS capsule Take 1 capsule by mouth daily.    . Liraglutide 18 MG/3ML SOPN Inject 1.8 mg into the skin daily.    Marland Kitchen losartan (COZAAR) 100 MG tablet Take 100 mg by mouth.    . metFORMIN (GLUCOPHAGE-XR) 500 MG 24 hr tablet  Take 500 mg by mouth daily.    . mineral oil-hydrophilic petrolatum (AQUAPHOR) ointment Apply topically as needed for dry skin.    . potassium chloride SA (K-DUR,KLOR-CON) 20 MEQ tablet Take 2 tablets (40 mEq total) by mouth once. 14 tablet 0  . rosuvastatin (CRESTOR) 40 MG tablet Take 40 mg by mouth daily.     . sotalol (BETAPACE) 80 MG tablet Take 1 tablet by mouth 2 (two) times daily.    . diphenhydrAMINE (BENADRYL) 25 mg capsule Take 1 capsule (25 mg total) by mouth once. Pt should take benadryl 25 mg capsule 1 capsule 1 hr prior to ct scan procedure 1 capsule 0  . promethazine (PHENERGAN) 25 MG tablet Take 25 mg by mouth every 6 (six) hours as needed for  nausea or vomiting.     No current facility-administered medications for this visit.     PHYSICAL EXAMINATION: ECOG PERFORMANCE STATUS: 0 - Asymptomatic  BP 128/84 (BP Location: Right Arm, Patient Position: Sitting)   Pulse 60   Temp 97.1 F (36.2 C) (Tympanic)   Resp 16   Ht 5\' 7"  (1.702 m)   Wt 253 lb 12.8 oz (115.1 kg)   BMI 39.75 kg/m   Filed Weights   08/02/16 1102  Weight: 253 lb 12.8 oz (115.1 kg)    GENERAL: Well-nourished well-developed; Alert, no distress and comfortable.   Alone. EYES: no pallor or icterus OROPHARYNX: no thrush or ulceration; good dentition  NECK: supple, no masses felt LYMPH:  no palpable lymphadenopathy in the cervical, axillary or inguinal regions LUNGS: clear to auscultation and  No wheeze or crackles HEART/CVS: regular rate & rhythm and no murmurs; No lower extremity edema ABDOMEN:abdomen soft, non-tender and normal bowel sounds Musculoskeletal:no cyanosis of digits and no clubbing  PSYCH: alert & oriented x 3 with fluent speech NEURO: no focal motor/sensory deficits SKIN:  no rashes or significant lesions  LABORATORY DATA:  I have reviewed the data as listed    Component Value Date/Time   NA 136 07/27/2016 0800   NA 136 03/10/2015 1640   K 3.4 (L) 07/27/2016 0800   K 2.7 (L) 03/10/2015 1640   CL 102 07/27/2016 0800   CL 96 (L) 03/10/2015 1640   CO2 24 07/27/2016 0800   CO2 31 03/10/2015 1640   GLUCOSE 438 (H) 07/27/2016 0800   GLUCOSE 99 03/10/2015 1640   BUN 25 (H) 07/27/2016 0800   BUN 16 03/10/2015 1640   CREATININE 1.24 (H) 07/27/2016 0800   CREATININE 1.18 (H) 03/10/2015 1640   CALCIUM 8.6 (L) 07/27/2016 0800   CALCIUM 9.0 03/10/2015 1640   PROT 8.2 (H) 07/27/2016 0800   PROT 7.7 03/10/2015 1640   ALBUMIN 4.0 07/27/2016 0800   ALBUMIN 4.1 03/10/2015 1640   AST 22 07/27/2016 0800   AST 34 03/10/2015 1640   ALT 20 07/27/2016 0800   ALT 39 03/10/2015 1640   ALKPHOS 118 07/27/2016 0800   ALKPHOS 60 03/10/2015 1640    BILITOT 0.5 07/27/2016 0800   BILITOT 0.4 03/10/2015 1640   GFRNONAA 46 (L) 07/27/2016 0800   GFRNONAA 50 (L) 03/10/2015 1640   GFRAA 53 (L) 07/27/2016 0800   GFRAA 58 (L) 03/10/2015 1640    No results found for: SPEP, UPEP  Lab Results  Component Value Date   WBC 12.4 (H) 07/27/2016   NEUTROABS 2.8 01/29/2016   HGB 12.3 07/27/2016   HCT 37.6 07/27/2016   MCV 83.0 07/27/2016   PLT 284 07/27/2016  Chemistry      Component Value Date/Time   NA 136 07/27/2016 0800   NA 136 03/10/2015 1640   K 3.4 (L) 07/27/2016 0800   K 2.7 (L) 03/10/2015 1640   CL 102 07/27/2016 0800   CL 96 (L) 03/10/2015 1640   CO2 24 07/27/2016 0800   CO2 31 03/10/2015 1640   BUN 25 (H) 07/27/2016 0800   BUN 16 03/10/2015 1640   CREATININE 1.24 (H) 07/27/2016 0800   CREATININE 1.18 (H) 03/10/2015 1640      Component Value Date/Time   CALCIUM 8.6 (L) 07/27/2016 0800   CALCIUM 9.0 03/10/2015 1640   ALKPHOS 118 07/27/2016 0800   ALKPHOS 60 03/10/2015 1640   AST 22 07/27/2016 0800   AST 34 03/10/2015 1640   ALT 20 07/27/2016 0800   ALT 39 03/10/2015 1640   BILITOT 0.5 07/27/2016 0800   BILITOT 0.4 03/10/2015 1640      IMPRESSION: 1. No acute findings within the abdomen or pelvis. 2. Previous cholecystectomy.  No biliary dilatation. 3. Aortic atherosclerosis.   Electronically Signed   By: Kerby Moors M.D.   On: 07/27/2016 10:00   ASSESSMENT & PLAN:   Mesenteric mass  # Anterior mesenteric nodule- 16x91mm [sep 2016; smaller]; Ilecolic LN [8KC; sep 0034; improving]. S/p Bx- leiomyoma. Re: anterior mesenteric nodule/ileocolic lymph node- SEP 9179- NED. Recommend repeating scan- in 1 year; if negative then discontinue scans.   # Severe hypokalemia- question related to antihypertensive; today-  normal [[place]; nep]  # Elevated Blood glucose- refer to PCP.   # patient follow-up with me in approximately 1 year/ labs in few days prior.      Cammie Sickle, MD 08/02/2016  1:02 PM

## 2016-11-09 ENCOUNTER — Other Ambulatory Visit: Payer: Self-pay | Admitting: Family Medicine

## 2016-11-09 DIAGNOSIS — Z1231 Encounter for screening mammogram for malignant neoplasm of breast: Secondary | ICD-10-CM

## 2016-12-14 ENCOUNTER — Ambulatory Visit
Admission: RE | Admit: 2016-12-14 | Discharge: 2016-12-14 | Disposition: A | Discharge status: Home / Self Care | Payer: Medicare HMO | Source: Ambulatory Visit | Attending: Family Medicine | Admitting: Family Medicine

## 2016-12-14 DIAGNOSIS — Z1231 Encounter for screening mammogram for malignant neoplasm of breast: Secondary | ICD-10-CM | POA: Diagnosis not present

## 2016-12-20 ENCOUNTER — Ambulatory Visit
Admission: RE | Admit: 2016-12-20 | Discharge: 2016-12-20 | Disposition: A | Discharge status: Home / Self Care | Payer: Medicare HMO | Source: Ambulatory Visit | Attending: Unknown Physician Specialty | Admitting: Unknown Physician Specialty

## 2016-12-20 ENCOUNTER — Ambulatory Visit: Payer: Medicare HMO | Admitting: Anesthesiology

## 2016-12-20 ENCOUNTER — Encounter
Admission: RE | Disposition: A | Discharge status: Home / Self Care | Payer: Self-pay | Source: Ambulatory Visit | Attending: Unknown Physician Specialty

## 2016-12-20 ENCOUNTER — Encounter: Payer: Self-pay | Admitting: Anesthesiology

## 2016-12-20 DIAGNOSIS — I4891 Unspecified atrial fibrillation: Secondary | ICD-10-CM | POA: Diagnosis not present

## 2016-12-20 DIAGNOSIS — Z7982 Long term (current) use of aspirin: Secondary | ICD-10-CM | POA: Diagnosis not present

## 2016-12-20 DIAGNOSIS — I2510 Atherosclerotic heart disease of native coronary artery without angina pectoris: Secondary | ICD-10-CM | POA: Diagnosis not present

## 2016-12-20 DIAGNOSIS — Z79899 Other long term (current) drug therapy: Secondary | ICD-10-CM | POA: Diagnosis not present

## 2016-12-20 DIAGNOSIS — K635 Polyp of colon: Secondary | ICD-10-CM | POA: Insufficient documentation

## 2016-12-20 DIAGNOSIS — K640 First degree hemorrhoids: Secondary | ICD-10-CM | POA: Insufficient documentation

## 2016-12-20 DIAGNOSIS — Z86010 Personal history of colonic polyps: Secondary | ICD-10-CM | POA: Diagnosis not present

## 2016-12-20 DIAGNOSIS — E1142 Type 2 diabetes mellitus with diabetic polyneuropathy: Secondary | ICD-10-CM | POA: Diagnosis not present

## 2016-12-20 DIAGNOSIS — K219 Gastro-esophageal reflux disease without esophagitis: Secondary | ICD-10-CM | POA: Diagnosis not present

## 2016-12-20 DIAGNOSIS — Z794 Long term (current) use of insulin: Secondary | ICD-10-CM | POA: Diagnosis not present

## 2016-12-20 DIAGNOSIS — Z1211 Encounter for screening for malignant neoplasm of colon: Secondary | ICD-10-CM | POA: Insufficient documentation

## 2016-12-20 DIAGNOSIS — R51 Headache: Secondary | ICD-10-CM | POA: Diagnosis not present

## 2016-12-20 DIAGNOSIS — G709 Myoneural disorder, unspecified: Secondary | ICD-10-CM | POA: Insufficient documentation

## 2016-12-20 DIAGNOSIS — D649 Anemia, unspecified: Secondary | ICD-10-CM | POA: Diagnosis not present

## 2016-12-20 DIAGNOSIS — I10 Essential (primary) hypertension: Secondary | ICD-10-CM | POA: Diagnosis not present

## 2016-12-20 HISTORY — PX: COLONOSCOPY WITH PROPOFOL: SHX5780

## 2016-12-20 LAB — GLUCOSE, CAPILLARY: Glucose-Capillary: 132 mg/dL — ABNORMAL HIGH (ref 65–99)

## 2016-12-20 SURGERY — COLONOSCOPY WITH PROPOFOL
Anesthesia: General

## 2016-12-20 MED ORDER — LABETALOL HCL 5 MG/ML IV SOLN
INTRAVENOUS | Status: AC
  Filled 2016-12-20: qty 4

## 2016-12-20 MED ORDER — MIDAZOLAM HCL 2 MG/2ML IJ SOLN
INTRAMUSCULAR | Status: AC
  Filled 2016-12-20: qty 2

## 2016-12-20 MED ORDER — FENTANYL CITRATE (PF) 100 MCG/2ML IJ SOLN
INTRAMUSCULAR | Status: AC
  Filled 2016-12-20: qty 2

## 2016-12-20 MED ORDER — PROPOFOL 500 MG/50ML IV EMUL
INTRAVENOUS | Status: AC
  Filled 2016-12-20: qty 50

## 2016-12-20 MED ORDER — MIDAZOLAM HCL 5 MG/5ML IJ SOLN
INTRAMUSCULAR | Status: DC | PRN
  Administered 2016-12-20 (×2): 1 mg via INTRAVENOUS

## 2016-12-20 MED ORDER — SODIUM CHLORIDE 0.9 % IV SOLN: INTRAVENOUS | Status: DC

## 2016-12-20 MED ORDER — LIDOCAINE HCL (PF) 2 % IJ SOLN
INTRAMUSCULAR | Status: DC | PRN
  Administered 2016-12-20: 50 mg

## 2016-12-20 MED ORDER — SODIUM CHLORIDE 0.9 % IV SOLN
INTRAVENOUS | Status: DC
  Administered 2016-12-20: 1000 mL via INTRAVENOUS

## 2016-12-20 MED ORDER — LABETALOL HCL 5 MG/ML IV SOLN
INTRAVENOUS | Status: DC | PRN
  Administered 2016-12-20 (×2): 5 mg via INTRAVENOUS

## 2016-12-20 MED ORDER — FENTANYL CITRATE (PF) 100 MCG/2ML IJ SOLN
INTRAMUSCULAR | Status: DC | PRN
  Administered 2016-12-20: 50 ug via INTRAVENOUS

## 2016-12-20 MED ORDER — PROPOFOL 10 MG/ML IV BOLUS
INTRAVENOUS | Status: DC | PRN
  Administered 2016-12-20: 20 mg via INTRAVENOUS
  Administered 2016-12-20: 30 mg via INTRAVENOUS

## 2016-12-20 MED ORDER — PROPOFOL 500 MG/50ML IV EMUL
INTRAVENOUS | Status: DC | PRN
  Administered 2016-12-20: 75 ug/kg/min via INTRAVENOUS

## 2016-12-20 NOTE — H&P (Signed)
Primary Care Physician:  Sabino Snipes KEY, MD Primary Gastroenterologist:  Dr. Vira Agar  Pre-Procedure History & Physical: HPI:  Hailey Howard is a 61 y.o. female is here for an colonoscopy.   Past Medical History:  Diagnosis Date  . Anemia   . Arthritis   . Coronary artery disease   . Diabetes mellitus without complication ([place])   . DM type 2 with diabetic peripheral neuropathy ([place]) 09/19/2015  . Dysrhythmia   . GERD (gastroesophageal reflux disease)   . Headache   . History of hiatal hernia   . Hypertension   . Mesenteric mass     Past Surgical History:  Procedure Laterality Date  . ABDOMINAL HYSTERECTOMY    . CHOLECYSTECTOMY    . CORONARY ARTERY BYPASS GRAFT    . EUS N/A 04/24/2015   Procedure: ESOPHAGEAL ENDOSCOPIC ULTRASOUND (EUS) RADIAL;  Surgeon: Cora Daniels, MD;  Location: [facility] ENDOSCOPY;  Service: Endoscopy;  Laterality: N/A;  . THYROIDECTOMY, PARTIAL      Prior to Admission medications   Medication Sig Start Date End Date Taking? Authorizing Provider  amLODipine (NORVASC) 5 MG tablet Take 2.5 mg by mouth. 07/12/16  Yes Historical Provider, MD  aspirin EC 81 MG tablet Take 1 tablet by mouth daily.   Yes Historical Provider, MD  Calcium Citrate-Vitamin D (CALCIUM CITRATE + D3 PO) Take 660 mg by mouth daily.  05/23/14  Yes Historical Provider, MD  Carboxymethylcellulose Sodium (GOODSENSE LUBRICATING EYE DROP OP) Apply 2 drops to eye daily.   Yes Historical Provider, MD  cholecalciferol (VITAMIN D) 1000 UNITS tablet Take 1 tablet by mouth daily. 06/03/14  Yes Historical Provider, MD  Denture Care Products (EFFERDENT DENTURE CLEANSER) TBEF Take 1 tablet by mouth daily. 02/06/15  Yes Historical Provider, MD  ferrous sulfate 325 (65 FE) MG tablet Take 325 mg by mouth daily with breakfast.   Yes Historical Provider, MD  fluticasone (FLONASE) 50 MCG/ACT nasal spray Place 2 sprays into both nostrils daily.   Yes Historical Provider, MD  furosemide (LASIX) 40 MG  tablet Take 1 tablet by mouth daily. 01/03/14  Yes Historical Provider, MD  gabapentin (NEURONTIN) 300 MG capsule Take 1 capsule by mouth daily as needed (nerve pain).    Yes Historical Provider, MD  insulin aspart (NOVOLOG FLEXPEN) 100 UNIT/ML FlexPen Take by mouth. 09/22/15  Yes Historical Provider, MD  Insulin Glargine (LANTUS) 100 UNIT/ML Solostar Pen 50 Units every evening. 01/23/14  Yes Historical Provider, MD  Linaclotide Rolan Lipa) 290 MCG CAPS capsule Take 1 capsule by mouth daily. 03/04/15  Yes Historical Provider, MD  Liraglutide 18 MG/3ML SOPN Inject 1.8 mg into the skin daily.   Yes Historical Provider, MD  losartan (COZAAR) 100 MG tablet Take 100 mg by mouth. 04/07/16 04/07/17 Yes Historical Provider, MD  metFORMIN (GLUCOPHAGE-XR) 500 MG 24 hr tablet Take 500 mg by mouth daily.   Yes Historical Provider, MD  mineral oil-hydrophilic petrolatum (AQUAPHOR) ointment Apply topically as needed for dry skin.   Yes Historical Provider, MD  potassium chloride SA (K-DUR,KLOR-CON) 20 MEQ tablet Take 2 tablets (40 mEq total) by mouth once. 01/29/16  Yes Cammie Sickle, MD  promethazine (PHENERGAN) 25 MG tablet Take 25 mg by mouth every 6 (six) hours as needed for nausea or vomiting.   Yes Historical Provider, MD  rosuvastatin (CRESTOR) 40 MG tablet Take 40 mg by mouth daily.  02/14/14  Yes Historical Provider, MD  sotalol (BETAPACE) 80 MG tablet Take 1 tablet by mouth 2 (two) times  daily. 04/19/14  Yes Historical Provider, MD  diphenhydrAMINE (BENADRYL) 25 mg capsule Take 1 capsule (25 mg total) by mouth once. Pt should take benadryl 25 mg capsule 1 capsule 1 hr prior to ct scan procedure 07/23/16 07/23/16  Cammie Sickle, MD    Allergies as of 11/30/2016 - Review Complete 08/02/2016  Allergen Reaction Noted  . Other Hives and Itching 04/03/2015  . Etodolac Itching 04/03/2015  . Iodinated diagnostic agents Other (See Comments) 04/03/2015  . Oxycodone-acetaminophen Hives 04/03/2015    Family  History  Problem Relation Age of Onset  . Breast cancer Neg Hx     Social History   Social History  . Marital status: Single    Spouse name: N/A  . Number of children: N/A  . Years of education: N/A   Occupational History  . Not on file.   Social History Main Topics  . Smoking status: Never Smoker  . Smokeless tobacco: Never Used  . Alcohol use No  . Drug use: No  . Sexual activity: Not on file   Other Topics Concern  . Not on file   Social History Narrative  . No narrative on file    Review of Systems: See HPI, otherwise negative ROS  Physical Exam: BP (!) 187/79   Pulse 64   Temp 97.2 F (36.2 C) (Tympanic)   Resp 20   Ht 5\' 7"  (1.702 m)   Wt 116.1 kg (256 lb)   SpO2 100%   BMI 40.10 kg/m  General:   Alert,  pleasant and cooperative in NAD Head:  Normocephalic and atraumatic. Neck:  Supple; no masses or thyromegaly. Lungs:  Clear throughout to auscultation.    Heart:  Regular rate and rhythm. Abdomen:  Soft, nontender and nondistended. Normal bowel sounds, without guarding, and without rebound.   Neurologic:  Alert and  oriented x4;  grossly normal neurologically.  Impression/Plan: Hailey Howard is here for an colonoscopy to be performed for [facility] colon polyps and FH of colon cancer  Risks, benefits, limitations, and alternatives regarding  colonoscopy have been reviewed with the patient.  Questions have been answered.  All parties agreeable.   Gaylyn Cheers, MD  12/20/2016, 8:26 AM

## 2016-12-20 NOTE — Anesthesia Preprocedure Evaluation (Addendum)
Anesthesia Evaluation  Patient identified by MRN, date of birth, ID band Patient awake    Reviewed: Allergy & Precautions, NPO status , Patient's Chart, lab work & pertinent test results, reviewed documented beta blocker date and time   Airway Mallampati: II  TM Distance: >3 FB     Dental  (+) Chipped, Partial Upper, Partial Lower   Pulmonary           Cardiovascular hypertension, Pt. on medications and Pt. on home beta blockers + CAD and + CABG  + dysrhythmias Atrial Fibrillation      Neuro/Psych  Headaches,  Neuromuscular disease    GI/Hepatic hiatal hernia, GERD  ,  Endo/Other  diabetes, Type 2  Renal/GU      Musculoskeletal  (+) Arthritis ,   Abdominal   Peds  Hematology  (+) anemia ,   Anesthesia Other Findings Pedal edema. Did not take sotalol.  Reproductive/Obstetrics                           Anesthesia Physical Anesthesia Plan  ASA: III  Anesthesia Plan: General   Post-op Pain Management:    Induction: Intravenous  Airway Management Planned: Oral ETT  Additional Equipment:   Intra-op Plan:   Post-operative Plan:   Informed Consent: I have reviewed the patients History and Physical, chart, labs and discussed the procedure including the risks, benefits and alternatives for the proposed anesthesia with the patient or authorized representative who has indicated his/her understanding and acceptance.     Plan Discussed with: CRNA  Anesthesia Plan Comments:         Anesthesia Quick Evaluation

## 2016-12-20 NOTE — Anesthesia Post-op Follow-up Note (Cosign Needed)
Anesthesia QCDR form completed.        

## 2016-12-20 NOTE — Op Note (Signed)
[facility] Gastroenterology Patient Name: Latha Day Procedure Date: 12/20/2016 8:36 AM MRN: [id number] Account #: [id number] Date of Birth: [birth year] Admit Type: Outpatient Age: 61 Room: [facility] ENDO ROOM 1 Gender: Female Note Status: Finalized Procedure:            Colonoscopy Indications:          High risk colon cancer surveillance: Personal history                        of colonic polyps Providers:            Manya Silvas, MD Referring MD:         Bethena Roys. Sowles, MD (Referring MD) Medicines:            Propofol per Anesthesia Complications:        No immediate complications. Procedure:            Pre-Anesthesia Assessment:                       - After reviewing the risks and benefits, the patient                        was deemed in satisfactory condition to undergo the                        procedure.                       After obtaining informed consent, the colonoscope was                        passed under direct vision. Throughout the procedure,                        the patient's blood pressure, pulse, and oxygen                        saturations were monitored continuously. The                        Colonoscope was introduced through the anus and                        advanced to the the cecum, identified by appendiceal                        orifice and ileocecal valve. The colonoscopy was                        performed without difficulty. The patient tolerated the                        procedure well. The quality of the bowel preparation                        was good. Findings:      Two sessile polyps were found in the sigmoid colon and transverse colon.       The polyps were diminutive in size. These polyps were removed with a       jumbo cold forceps. Resection and retrieval were complete.  The exam was otherwise without abnormality.      Internal hemorrhoids were found during endoscopy. The hemorrhoids were       small  and Grade I (internal hemorrhoids that do not prolapse). Impression:           - Two diminutive polyps in the sigmoid colon and in the                        transverse colon, removed with a jumbo cold forceps.                        Resected and retrieved.                       - The examination was otherwise normal. Recommendation:       - Await pathology results. Manya Silvas, MD 12/20/2016 9:09:51 AM This report has been signed electronically. Number of Addenda: 0 Note Initiated On: 12/20/2016 8:36 AM Scope Withdrawal Time: 0 hours 18 minutes 3 seconds  Total Procedure Duration: 0 hours 28 minutes 23 seconds       [facility]

## 2016-12-20 NOTE — Transfer of Care (Signed)
Immediate Anesthesia Transfer of Care Note  Patient: Hailey Howard  Procedure(s) Performed: Procedure(s): COLONOSCOPY WITH PROPOFOL (N/A)  Patient Location: PACU  Anesthesia Type:General  Level of Consciousness: sedated  Airway & Oxygen Therapy: Patient Spontanous Breathing and Patient connected to nasal cannula oxygen  Post-op Assessment: Report given to RN and Post -op Vital signs reviewed and stable  Post vital signs: Reviewed and stable  Last Vitals:  Vitals:   12/20/16 0811 12/20/16 0910  BP: (!) 187/79 (!) 160/77  Pulse: 64 65  Resp: 20 20  Temp: 36.2 C (!) 35.8 C    Last Pain:  Vitals:   12/20/16 0910  TempSrc: Tympanic         Complications: No apparent anesthesia complications

## 2016-12-20 NOTE — Anesthesia Postprocedure Evaluation (Signed)
Anesthesia Post Note  Patient: Hailey Howard Day  Procedure(s) Performed: Procedure(s) (LRB): COLONOSCOPY WITH PROPOFOL (N/A)  Patient location during evaluation: Endoscopy Anesthesia Type: General Level of consciousness: awake and alert Pain management: pain level controlled Vital Signs Assessment: post-procedure vital signs reviewed and stable Respiratory status: spontaneous breathing, nonlabored ventilation, respiratory function stable and patient connected to nasal cannula oxygen Cardiovascular status: blood pressure returned to baseline and stable Postop Assessment: no signs of nausea or vomiting Anesthetic complications: no     Last Vitals:  Vitals:   12/20/16 0930 12/20/16 0940  BP: (!) 172/84 (!) 181/85  Pulse:  63  Resp:  20  Temp:      Last Pain:  Vitals:   12/20/16 0910  TempSrc: Tympanic                 Wm Fruchter S

## 2016-12-21 ENCOUNTER — Encounter: Payer: Self-pay | Admitting: Unknown Physician Specialty

## 2016-12-21 LAB — SURGICAL PATHOLOGY

## 2017-01-18 ENCOUNTER — Other Ambulatory Visit: Payer: Self-pay | Admitting: Surgery

## 2017-01-18 DIAGNOSIS — M7542 Impingement syndrome of left shoulder: Secondary | ICD-10-CM

## 2017-01-18 DIAGNOSIS — M7582 Other shoulder lesions, left shoulder: Secondary | ICD-10-CM

## 2017-01-18 DIAGNOSIS — M7552 Bursitis of left shoulder: Secondary | ICD-10-CM

## 2017-02-04 ENCOUNTER — Ambulatory Visit
Admission: RE | Admit: 2017-02-04 | Discharge: 2017-02-04 | Disposition: A | Discharge status: Home / Self Care | Payer: Medicare HMO | Source: Ambulatory Visit | Attending: Surgery | Admitting: Surgery

## 2017-02-04 DIAGNOSIS — M7582 Other shoulder lesions, left shoulder: Secondary | ICD-10-CM | POA: Diagnosis present

## 2017-02-04 DIAGNOSIS — M7552 Bursitis of left shoulder: Secondary | ICD-10-CM | POA: Diagnosis not present

## 2017-02-04 DIAGNOSIS — M7542 Impingement syndrome of left shoulder: Secondary | ICD-10-CM

## 2017-02-04 DIAGNOSIS — M19012 Primary osteoarthritis, left shoulder: Secondary | ICD-10-CM | POA: Diagnosis not present

## 2017-04-05 ENCOUNTER — Other Ambulatory Visit: Payer: Self-pay | Admitting: Nurse Practitioner

## 2017-04-05 ENCOUNTER — Other Ambulatory Visit: Payer: Self-pay | Admitting: Family Medicine

## 2017-04-05 DIAGNOSIS — R1011 Right upper quadrant pain: Secondary | ICD-10-CM

## 2017-04-05 DIAGNOSIS — M79602 Pain in left arm: Secondary | ICD-10-CM

## 2017-04-14 ENCOUNTER — Ambulatory Visit
Admission: RE | Admit: 2017-04-14 | Discharge: 2017-04-14 | Disposition: A | Discharge status: Home / Self Care | Payer: Medicare HMO | Source: Ambulatory Visit | Attending: Nurse Practitioner | Admitting: Nurse Practitioner

## 2017-04-14 DIAGNOSIS — R1011 Right upper quadrant pain: Secondary | ICD-10-CM | POA: Diagnosis not present

## 2017-04-14 DIAGNOSIS — Z9049 Acquired absence of other specified parts of digestive tract: Secondary | ICD-10-CM | POA: Insufficient documentation

## 2017-04-14 DIAGNOSIS — I2510 Atherosclerotic heart disease of native coronary artery without angina pectoris: Secondary | ICD-10-CM | POA: Diagnosis not present

## 2017-04-20 DIAGNOSIS — K219 Gastro-esophageal reflux disease without esophagitis: Secondary | ICD-10-CM | POA: Insufficient documentation

## 2017-04-22 ENCOUNTER — Ambulatory Visit: Payer: Medicare HMO

## 2017-04-26 ENCOUNTER — Encounter: Payer: Self-pay | Admitting: *Deleted

## 2017-04-27 ENCOUNTER — Encounter: Payer: Self-pay | Admitting: *Deleted

## 2017-04-27 ENCOUNTER — Ambulatory Visit: Payer: Medicare HMO | Admitting: Anesthesiology

## 2017-04-27 ENCOUNTER — Encounter
Admission: RE | Disposition: A | Discharge status: Home / Self Care | Payer: Self-pay | Source: Ambulatory Visit | Attending: Unknown Physician Specialty

## 2017-04-27 ENCOUNTER — Ambulatory Visit
Admission: RE | Admit: 2017-04-27 | Discharge: 2017-04-27 | Disposition: A | Discharge status: Home / Self Care | Payer: Medicare HMO | Source: Ambulatory Visit | Attending: Unknown Physician Specialty | Admitting: Unknown Physician Specialty

## 2017-04-27 DIAGNOSIS — K219 Gastro-esophageal reflux disease without esophagitis: Secondary | ICD-10-CM | POA: Diagnosis not present

## 2017-04-27 DIAGNOSIS — K222 Esophageal obstruction: Secondary | ICD-10-CM | POA: Insufficient documentation

## 2017-04-27 DIAGNOSIS — I454 Nonspecific intraventricular block: Secondary | ICD-10-CM | POA: Insufficient documentation

## 2017-04-27 DIAGNOSIS — I10 Essential (primary) hypertension: Secondary | ICD-10-CM | POA: Diagnosis not present

## 2017-04-27 DIAGNOSIS — I2510 Atherosclerotic heart disease of native coronary artery without angina pectoris: Secondary | ICD-10-CM | POA: Insufficient documentation

## 2017-04-27 DIAGNOSIS — Z888 Allergy status to other drugs, medicaments and biological substances status: Secondary | ICD-10-CM | POA: Insufficient documentation

## 2017-04-27 DIAGNOSIS — E1142 Type 2 diabetes mellitus with diabetic polyneuropathy: Secondary | ICD-10-CM | POA: Diagnosis not present

## 2017-04-27 DIAGNOSIS — K2950 Unspecified chronic gastritis without bleeding: Secondary | ICD-10-CM | POA: Diagnosis not present

## 2017-04-27 DIAGNOSIS — M1990 Unspecified osteoarthritis, unspecified site: Secondary | ICD-10-CM | POA: Diagnosis not present

## 2017-04-27 DIAGNOSIS — I4891 Unspecified atrial fibrillation: Secondary | ICD-10-CM | POA: Insufficient documentation

## 2017-04-27 DIAGNOSIS — Z794 Long term (current) use of insulin: Secondary | ICD-10-CM | POA: Diagnosis not present

## 2017-04-27 DIAGNOSIS — Z91041 Radiographic dye allergy status: Secondary | ICD-10-CM | POA: Insufficient documentation

## 2017-04-27 DIAGNOSIS — Z6841 Body Mass Index (BMI) 40.0 and over, adult: Secondary | ICD-10-CM | POA: Insufficient documentation

## 2017-04-27 DIAGNOSIS — Z951 Presence of aortocoronary bypass graft: Secondary | ICD-10-CM | POA: Diagnosis not present

## 2017-04-27 DIAGNOSIS — Z885 Allergy status to narcotic agent status: Secondary | ICD-10-CM | POA: Insufficient documentation

## 2017-04-27 DIAGNOSIS — Z90710 Acquired absence of both cervix and uterus: Secondary | ICD-10-CM | POA: Diagnosis not present

## 2017-04-27 DIAGNOSIS — R1310 Dysphagia, unspecified: Secondary | ICD-10-CM | POA: Diagnosis present

## 2017-04-27 DIAGNOSIS — Z7982 Long term (current) use of aspirin: Secondary | ICD-10-CM | POA: Diagnosis not present

## 2017-04-27 DIAGNOSIS — Z79899 Other long term (current) drug therapy: Secondary | ICD-10-CM | POA: Insufficient documentation

## 2017-04-27 HISTORY — DX: Nonspecific intraventricular block: I45.4

## 2017-04-27 HISTORY — DX: Unspecified atrial fibrillation: I48.91

## 2017-04-27 HISTORY — PX: ESOPHAGOGASTRODUODENOSCOPY (EGD) WITH PROPOFOL: SHX5813

## 2017-04-27 HISTORY — DX: Dysphagia, unspecified: R13.10

## 2017-04-27 HISTORY — DX: Other shoulder lesions, left shoulder: M75.82

## 2017-04-27 HISTORY — DX: Bradycardia, unspecified: R00.1

## 2017-04-27 LAB — GLUCOSE, CAPILLARY: GLUCOSE-CAPILLARY: 130 mg/dL — AB (ref 65–99)

## 2017-04-27 SURGERY — ESOPHAGOGASTRODUODENOSCOPY (EGD) WITH PROPOFOL
Anesthesia: General

## 2017-04-27 MED ORDER — PROPOFOL 500 MG/50ML IV EMUL
INTRAVENOUS | Status: DC | PRN
  Administered 2017-04-27: 180 ug/kg/min via INTRAVENOUS

## 2017-04-27 MED ORDER — EPHEDRINE SULFATE 50 MG/ML IJ SOLN
INTRAMUSCULAR | Status: AC
  Filled 2017-04-27: qty 1

## 2017-04-27 MED ORDER — PROPOFOL 10 MG/ML IV BOLUS
INTRAVENOUS | Status: DC | PRN
  Administered 2017-04-27: 40 mg via INTRAVENOUS

## 2017-04-27 MED ORDER — SODIUM CHLORIDE 0.9 % IV SOLN
INTRAVENOUS | Status: DC
  Administered 2017-04-27 (×2): via INTRAVENOUS

## 2017-04-27 MED ORDER — PROPOFOL 500 MG/50ML IV EMUL
INTRAVENOUS | Status: AC
  Filled 2017-04-27: qty 50

## 2017-04-27 MED ORDER — FENTANYL CITRATE (PF) 100 MCG/2ML IJ SOLN
INTRAMUSCULAR | Status: DC | PRN
  Administered 2017-04-27: 50 ug via INTRAVENOUS

## 2017-04-27 MED ORDER — MIDAZOLAM HCL 2 MG/2ML IJ SOLN
INTRAMUSCULAR | Status: DC | PRN
  Administered 2017-04-27: 1 mg via INTRAVENOUS

## 2017-04-27 MED ORDER — FENTANYL CITRATE (PF) 100 MCG/2ML IJ SOLN
INTRAMUSCULAR | Status: AC
  Filled 2017-04-27: qty 2

## 2017-04-27 MED ORDER — MIDAZOLAM HCL 2 MG/2ML IJ SOLN
INTRAMUSCULAR | Status: AC
  Filled 2017-04-27: qty 2

## 2017-04-27 MED ORDER — LIDOCAINE HCL (CARDIAC) 20 MG/ML IV SOLN
INTRAVENOUS | Status: DC | PRN
Start: 2017-04-27 — End: 2017-04-27
  Administered 2017-04-27: 100 mg via INTRAVENOUS

## 2017-04-27 MED ORDER — SODIUM CHLORIDE 0.9 % IV SOLN: INTRAVENOUS | Status: DC

## 2017-04-27 MED ORDER — EPHEDRINE SULFATE 50 MG/ML IJ SOLN
INTRAMUSCULAR | Status: DC | PRN
  Administered 2017-04-27: 10 mg via INTRAVENOUS

## 2017-04-27 NOTE — H&P (Signed)
Primary Care Physician:  Herminio Commons, MD Primary Gastroenterologist:  Dr. Vira Agar  Pre-Procedure History & Physical: HPI:  Hailey Howard is a 61 y.o. female is here for an endoscopy.   Past Medical History:  Diagnosis Date  . Anemia   . Arthritis   . BBB (bundle branch block)   . Bradycardia   . Coronary artery disease   . Diabetes mellitus without complication ([place])   . DM type 2 with diabetic peripheral neuropathy ([place]) 09/19/2015  . Dysphagia   . Dysrhythmia   . GERD (gastroesophageal reflux disease)   . Headache   . History of hiatal hernia   . Hypertension   . Mesenteric mass   . Rotator cuff tendinitis, left   . Unspecified atrial fibrillation [facility])     Past Surgical History:  Procedure Laterality Date  . ABDOMINAL HYSTERECTOMY    . CHOLECYSTECTOMY    . COLONOSCOPY WITH PROPOFOL N/A 12/20/2016   Procedure: COLONOSCOPY WITH PROPOFOL;  Surgeon: Manya Silvas, MD;  Location: [facility] ENDOSCOPY;  Service: Endoscopy;  Laterality: N/A;  . CORONARY ARTERY BYPASS GRAFT    . EUS N/A 04/24/2015   Procedure: ESOPHAGEAL ENDOSCOPIC ULTRASOUND (EUS) RADIAL;  Surgeon: Cora Daniels, MD;  Location: [facility] ENDOSCOPY;  Service: Endoscopy;  Laterality: N/A;  . THYROIDECTOMY, PARTIAL      Prior to Admission medications   Medication Sig Start Date End Date Taking? Authorizing Provider  amLODipine (NORVASC) 5 MG tablet Take 2.5 mg by mouth. 07/12/16  Yes [provider]  aspirin EC 81 MG tablet Take 1 tablet by mouth daily.   Yes [provider]  Calcium Citrate-Vitamin D (CALCIUM CITRATE + D3 PO) Take 660 mg by mouth daily.  05/23/14  Yes [provider]  cholecalciferol (VITAMIN D) 1000 UNITS tablet Take 1 tablet by mouth daily. 06/03/14  Yes [provider]  clotrimazole (LOTRIMIN) 1 % cream Apply 1 application topically 2 (two) times daily.   Yes [provider]  furosemide (LASIX) 40 MG tablet Take 1 tablet by mouth daily.  01/03/14  Yes [provider]  insulin aspart (NOVOLOG FLEXPEN) 100 UNIT/ML FlexPen Take by mouth. 09/22/15  Yes [provider]  Insulin Glargine (LANTUS) 100 UNIT/ML Solostar Pen 50 Units every evening. 01/23/14  Yes [provider]  lansoprazole (PREVACID) 30 MG capsule Take 30 mg by mouth daily before breakfast.   Yes [provider]  losartan (COZAAR) 100 MG tablet Take 100 mg by mouth. 04/07/16 04/27/17 Yes [provider]  potassium chloride SA (K-DUR,KLOR-CON) 20 MEQ tablet Take 2 tablets (40 mEq total) by mouth once. 01/29/16  Yes Cammie Sickle, MD  rosuvastatin (CRESTOR) 40 MG tablet Take 40 mg by mouth daily.  02/14/14  Yes [provider]  sotalol (BETAPACE) 80 MG tablet Take 1 tablet by mouth 2 (two) times daily. 04/19/14  Yes [provider]  Carboxymethylcellulose Sodium (GOODSENSE LUBRICATING EYE DROP OP) Apply 2 drops to eye daily.    [provider]  Denture Care Products (EFFERDENT DENTURE CLEANSER) TBEF Take 1 tablet by mouth daily. 02/06/15   [provider]  diphenhydrAMINE (BENADRYL) 25 mg capsule Take 1 capsule (25 mg total) by mouth once. Pt should take benadryl 25 mg capsule 1 capsule 1 hr prior to ct scan procedure 07/23/16 07/23/16  Cammie Sickle, MD  ferrous sulfate 325 (65 FE) MG tablet Take 325 mg by mouth daily with breakfast.    [provider]  fluticasone Asencion Islam)  50 MCG/ACT nasal spray Place 2 sprays into both nostrils daily.    [provider]  gabapentin (NEURONTIN) 300 MG capsule Take 1 capsule by mouth daily as needed (nerve pain).     [provider]  Linaclotide Rolan Lipa) 290 MCG CAPS capsule Take 1 capsule by mouth daily. 03/04/15   [provider]  Liraglutide 18 MG/3ML SOPN Inject 1.8 mg into the skin daily.    [provider]  metFORMIN (GLUCOPHAGE-XR) 500 MG 24 hr tablet Take 500 mg by mouth daily.    [provider]   mineral oil-hydrophilic petrolatum (AQUAPHOR) ointment Apply topically as needed for dry skin.    [provider]  promethazine (PHENERGAN) 25 MG tablet Take 25 mg by mouth every 6 (six) hours as needed for nausea or vomiting.    [provider]    Allergies as of 04/07/2017 - Review Complete 12/20/2016  Allergen Reaction Noted  . Other Hives and Itching 04/03/2015  . Etodolac Itching 04/03/2015  . Iodinated diagnostic agents Other (See Comments) 04/03/2015  . Oxycodone-acetaminophen Hives 04/03/2015    Family History  Problem Relation Age of Onset  . Breast cancer Neg Hx     Social History   Social History  . Marital status: Single    Spouse name: N/A  . Number of children: N/A  . Years of education: N/A   Occupational History  . Not on file.   Social History Main Topics  . Smoking status: Never Smoker  . Smokeless tobacco: Never Used  . Alcohol use No  . Drug use: No  . Sexual activity: Not on file   Other Topics Concern  . Not on file   Social History Narrative  . No narrative on file    Review of Systems: See HPI, otherwise negative ROS  Physical Exam: BP (!) 179/71   Pulse (!) 56   Temp (!) 96.7 F (35.9 C) (Tympanic)   Resp 16   Ht 5\' 7"  (1.702 m)   Wt 118.4 kg (261 lb)   SpO2 100%   BMI 40.88 kg/m  General:   Alert,  pleasant and cooperative in NAD Head:  Normocephalic and atraumatic. Neck:  Supple; no masses or thyromegaly. Lungs:  Clear throughout to auscultation.    Heart:  Regular rate and rhythm. Abdomen:  Soft, nontender and nondistended. Normal bowel sounds, without guarding, and without rebound.   Neurologic:  Alert and  oriented x4;  grossly normal neurologically.  Impression/Plan: Hailey Howard is here for an endoscopy to be performed for dysphagia  Risks, benefits, limitations, and alternatives regarding  endoscopy have been reviewed with the patient.  Questions have been answered.  All parties  agreeable.   Gaylyn Cheers, MD  04/27/2017, 7:27 AM

## 2017-04-27 NOTE — Transfer of Care (Signed)
Immediate Anesthesia Transfer of Care Note  Patient: Hailey Howard Day  Procedure(s) Performed: Procedure(s): ESOPHAGOGASTRODUODENOSCOPY (EGD) WITH PROPOFOL (N/A)  Patient Location: PACU  Anesthesia Type:General  Level of Consciousness: awake  Airway & Oxygen Therapy: Patient Spontanous Breathing and Patient connected to nasal cannula oxygen  Post-op Assessment: Report given to RN and Post -op Vital signs reviewed and stable  Post vital signs: Reviewed and stable  Last Vitals:  Vitals:   04/27/17 0647  BP: (!) 179/71  Pulse: (!) 56  Resp: 16  Temp: (!) 35.9 C    Last Pain:  Vitals:   04/27/17 0647  TempSrc: Tympanic         Complications: No apparent anesthesia complications

## 2017-04-27 NOTE — Op Note (Signed)
[facility] Gastroenterology Patient Name: Hailey Howard Procedure Date: 04/27/2017 7:20 AM MRN: [id number] Account #: [id number] Date of Birth: [birth year] Admit Type: Outpatient Age: 61 Room: [facility] ENDO ROOM 4 Gender: Female Note Status: Finalized Procedure:            Upper GI endoscopy Indications:          Dysphagia Providers:            Manya Silvas, MD Referring MD:         Tania Ade (Referring MD) Medicines:            Propofol per Anesthesia Complications:        No immediate complications. Procedure:            Pre-Anesthesia Assessment:                       - After reviewing the risks and benefits, the patient                        was deemed in satisfactory condition to undergo the                        procedure.                       After obtaining informed consent, the endoscope was                        passed under direct vision. Throughout the procedure,                        the patient's blood pressure, pulse, and oxygen                        saturations were monitored continuously. The Endoscope                        was introduced through the mouth, and advanced to the                        second part of duodenum. The upper GI endoscopy was                        accomplished without difficulty. The patient tolerated                        the procedure well. Findings:      A mild Schatzki ring (acquired) was found at the gastroesophageal       junction. At the end of the procedure A guidewire was placed and the       scope was withdrawn. Dilation was performed with a Savary dilator with       mild resistance at 17 mm.      Patchy mild inflammation characterized by erythema was found in the       gastric antrum. Biopsies were taken with a cold forceps for histology.       Biopsies were taken with a cold forceps for Helicobacter pylori testing.      The examined duodenum was normal. Impression:           - Mild Schatzki  ring. Dilated.                       -  Gastritis. Biopsied.                       - Normal examined duodenum. Recommendation:       - Await pathology results.                       - soft food for 3 days, eat slowly, chew well, take                        small bites Manya Silvas, MD 04/27/2017 7:52:31 AM This report has been signed electronically. Number of Addenda: 0 Note Initiated On: 04/27/2017 7:20 AM      [facility]

## 2017-04-27 NOTE — Anesthesia Procedure Notes (Signed)
Date/Time: 04/27/2017 7:45 AM Performed by: Allean Found Pre-anesthesia Checklist: Patient identified, Emergency Drugs available, Suction available, Patient being monitored and Timeout performed Patient Re-evaluated:Patient Re-evaluated prior to inductionOxygen Delivery Method: Nasal cannula Intubation Type: IV induction Placement Confirmation: positive ETCO2

## 2017-04-27 NOTE — Anesthesia Preprocedure Evaluation (Signed)
Anesthesia Evaluation  Patient identified by MRN, date of birth, ID band Patient awake    Reviewed: Allergy & Precautions, H&P , NPO status , Patient's Chart, lab work & pertinent test results, reviewed documented beta blocker date and time   Airway Mallampati: III   Neck ROM: full    Dental  (+) Teeth Intact   Pulmonary neg pulmonary ROS,    Pulmonary exam normal        Cardiovascular Exercise Tolerance: Poor hypertension, + CAD  negative cardio ROS Normal cardiovascular exam+ dysrhythmias Atrial Fibrillation  Rhythm:regular Rate:Normal     Neuro/Psych  Headaches,  Neuromuscular disease negative neurological ROS  negative psych ROS   GI/Hepatic negative GI ROS, Neg liver ROS, hiatal hernia, GERD  Medicated,  Endo/Other  negative endocrine ROSdiabetes, Well Controlled, Type 2Morbid obesity  Renal/GU negative Renal ROS  negative genitourinary   Musculoskeletal   Abdominal   Peds  Hematology negative hematology ROS (+) anemia ,   Anesthesia Other Findings Past Medical History: No date: Anemia No date: Arthritis No date: BBB (bundle branch block) No date: Bradycardia No date: Coronary artery disease No date: Diabetes mellitus without complication ([place]) 33/54/5625: DM type 2 with diabetic peripheral neuropathy * No date: Dysphagia No date: Dysrhythmia No date: GERD (gastroesophageal reflux disease) No date: Headache No date: History of hiatal hernia No date: Hypertension No date: Mesenteric mass No date: Rotator cuff tendinitis, left No date: Unspecified atrial fibrillation [facility]) Past Surgical History: No date: ABDOMINAL HYSTERECTOMY No date: CHOLECYSTECTOMY 12/20/2016: COLONOSCOPY WITH PROPOFOL N/A     Comment: Procedure: COLONOSCOPY WITH PROPOFOL;                Surgeon: Manya Silvas, MD;  Location: [facility]               ENDOSCOPY;  Service: Endoscopy;  Laterality:               N/A; No date: CORONARY  ARTERY BYPASS GRAFT 04/24/2015: EUS N/A     Comment: Procedure: ESOPHAGEAL ENDOSCOPIC ULTRASOUND               (EUS) RADIAL;  Surgeon: Cora Daniels,               MD;  Location: [facility] ENDOSCOPY;  Service:               Endoscopy;  Laterality: N/A; No date: THYROIDECTOMY, PARTIAL BMI    Body Mass Index:  40.88 kg/m     Reproductive/Obstetrics negative OB ROS                             Anesthesia Physical Anesthesia Plan  ASA: III  Anesthesia Plan: General   Post-op Pain Management:    Induction:   PONV Risk Score and Plan:   Airway Management Planned:   Additional Equipment:   Intra-op Plan:   Post-operative Plan:   Informed Consent: I have reviewed the patients History and Physical, chart, labs and discussed the procedure including the risks, benefits and alternatives for the proposed anesthesia with the patient or authorized representative who has indicated his/her understanding and acceptance.   Dental Advisory Given  Plan Discussed with: CRNA  Anesthesia Plan Comments:         Anesthesia Quick Evaluation

## 2017-04-27 NOTE — Anesthesia Post-op Follow-up Note (Cosign Needed)
Anesthesia QCDR form completed.        

## 2017-04-28 ENCOUNTER — Encounter: Payer: Self-pay | Admitting: Unknown Physician Specialty

## 2017-04-28 LAB — SURGICAL PATHOLOGY

## 2017-04-28 NOTE — Anesthesia Postprocedure Evaluation (Signed)
Anesthesia Post Note  Patient: Hailey Howard  Procedure(s) Performed: Procedure(s) (LRB): ESOPHAGOGASTRODUODENOSCOPY (EGD) WITH PROPOFOL (N/A)  Patient location during evaluation: PACU Anesthesia Type: General Level of consciousness: awake and alert Pain management: pain level controlled Vital Signs Assessment: post-procedure vital signs reviewed and stable Respiratory status: spontaneous breathing, nonlabored ventilation, respiratory function stable and patient connected to nasal cannula oxygen Cardiovascular status: blood pressure returned to baseline and stable Postop Assessment: no signs of nausea or vomiting Anesthetic complications: no     Last Vitals:  Vitals:   04/27/17 0825 04/27/17 0835  BP: (!) 161/85 138/75  Pulse: (!) 55 (!) 56  Resp: 18 18  Temp:      Last Pain:  Vitals:   04/28/17 0722  TempSrc:   PainSc: 0-No pain                 Molli Barrows

## 2017-06-21 DIAGNOSIS — K2930 Chronic superficial gastritis without bleeding: Secondary | ICD-10-CM | POA: Insufficient documentation

## 2017-06-21 DIAGNOSIS — K222 Esophageal obstruction: Secondary | ICD-10-CM | POA: Insufficient documentation

## 2017-07-14 ENCOUNTER — Ambulatory Visit
Admission: RE | Admit: 2017-07-14 | Discharge: 2017-07-14 | Disposition: A | Discharge status: Home / Self Care | Payer: Medicare HMO | Source: Ambulatory Visit | Attending: Family Medicine | Admitting: Family Medicine

## 2017-07-14 DIAGNOSIS — R937 Abnormal findings on diagnostic imaging of other parts of musculoskeletal system: Secondary | ICD-10-CM | POA: Insufficient documentation

## 2017-07-14 DIAGNOSIS — M79602 Pain in left arm: Secondary | ICD-10-CM | POA: Insufficient documentation

## 2017-07-29 ENCOUNTER — Other Ambulatory Visit: Payer: Self-pay | Admitting: *Deleted

## 2017-07-29 DIAGNOSIS — K6389 Other specified diseases of intestine: Secondary | ICD-10-CM

## 2017-07-29 MED ORDER — PREDNISONE 50 MG PO TABS: ORAL_TABLET | ORAL | 0 refills | Status: DC

## 2017-07-29 MED ORDER — DIPHENHYDRAMINE HCL 25 MG PO CAPS: 25.0000 mg | ORAL_CAPSULE | Freq: Once | ORAL | 0 refills | Status: DC

## 2017-07-29 MED ORDER — RANITIDINE HCL 150 MG PO TABS: 150.0000 mg | ORAL_TABLET | Freq: Once | ORAL | 0 refills | Status: DC

## 2017-08-01 ENCOUNTER — Ambulatory Visit: Admission: RE | Admit: 2017-08-01 | Payer: Medicare HMO | Source: Ambulatory Visit

## 2017-08-01 ENCOUNTER — Ambulatory Visit
Admission: RE | Admit: 2017-08-01 | Discharge: 2017-08-01 | Disposition: A | Discharge status: Home / Self Care | Payer: Medicare HMO | Source: Ambulatory Visit | Attending: Internal Medicine | Admitting: Internal Medicine

## 2017-08-01 ENCOUNTER — Inpatient Hospital Stay: Admission: RE | Admit: 2017-08-01 | Payer: Medicare HMO | Source: Ambulatory Visit

## 2017-08-01 DIAGNOSIS — K6389 Other specified diseases of intestine: Secondary | ICD-10-CM

## 2017-08-01 DIAGNOSIS — K639 Disease of intestine, unspecified: Secondary | ICD-10-CM | POA: Diagnosis present

## 2017-08-01 DIAGNOSIS — I700 Atherosclerosis of aorta: Secondary | ICD-10-CM | POA: Diagnosis not present

## 2017-08-01 DIAGNOSIS — I2510 Atherosclerotic heart disease of native coronary artery without angina pectoris: Secondary | ICD-10-CM | POA: Insufficient documentation

## 2017-08-01 LAB — POCT I-STAT CREATININE: Creatinine, Ser: 1.3 mg/dL — ABNORMAL HIGH (ref 0.44–1.00)

## 2017-08-01 MED ORDER — IOPAMIDOL (ISOVUE-300) INJECTION 61%
100.0000 mL | Freq: Once | INTRAVENOUS | Status: AC | PRN
  Administered 2017-08-01: 100 mL via INTRAVENOUS

## 2017-08-03 ENCOUNTER — Inpatient Hospital Stay (HOSPITAL_BASED_OUTPATIENT_CLINIC_OR_DEPARTMENT_OTHER): Payer: Medicare HMO | Admitting: Internal Medicine

## 2017-08-03 ENCOUNTER — Inpatient Hospital Stay: Payer: Medicare HMO | Attending: Internal Medicine

## 2017-08-03 ENCOUNTER — Other Ambulatory Visit: Payer: Self-pay | Admitting: *Deleted

## 2017-08-03 VITALS — BP 150/74 | HR 59 | Temp 97.4°F | Resp 16 | Wt 261.4 lb

## 2017-08-03 DIAGNOSIS — K219 Gastro-esophageal reflux disease without esophagitis: Secondary | ICD-10-CM

## 2017-08-03 DIAGNOSIS — I4891 Unspecified atrial fibrillation: Secondary | ICD-10-CM

## 2017-08-03 DIAGNOSIS — I454 Nonspecific intraventricular block: Secondary | ICD-10-CM

## 2017-08-03 DIAGNOSIS — R001 Bradycardia, unspecified: Secondary | ICD-10-CM | POA: Insufficient documentation

## 2017-08-03 DIAGNOSIS — M549 Dorsalgia, unspecified: Secondary | ICD-10-CM

## 2017-08-03 DIAGNOSIS — R1310 Dysphagia, unspecified: Secondary | ICD-10-CM | POA: Diagnosis not present

## 2017-08-03 DIAGNOSIS — G8929 Other chronic pain: Secondary | ICD-10-CM | POA: Insufficient documentation

## 2017-08-03 DIAGNOSIS — K639 Disease of intestine, unspecified: Secondary | ICD-10-CM

## 2017-08-03 DIAGNOSIS — E1140 Type 2 diabetes mellitus with diabetic neuropathy, unspecified: Secondary | ICD-10-CM | POA: Insufficient documentation

## 2017-08-03 DIAGNOSIS — I700 Atherosclerosis of aorta: Secondary | ICD-10-CM | POA: Insufficient documentation

## 2017-08-03 DIAGNOSIS — M129 Arthropathy, unspecified: Secondary | ICD-10-CM | POA: Diagnosis not present

## 2017-08-03 DIAGNOSIS — D649 Anemia, unspecified: Secondary | ICD-10-CM | POA: Insufficient documentation

## 2017-08-03 DIAGNOSIS — Z794 Long term (current) use of insulin: Secondary | ICD-10-CM | POA: Diagnosis not present

## 2017-08-03 DIAGNOSIS — I10 Essential (primary) hypertension: Secondary | ICD-10-CM | POA: Diagnosis not present

## 2017-08-03 DIAGNOSIS — E1165 Type 2 diabetes mellitus with hyperglycemia: Secondary | ICD-10-CM

## 2017-08-03 DIAGNOSIS — Z79899 Other long term (current) drug therapy: Secondary | ICD-10-CM | POA: Insufficient documentation

## 2017-08-03 DIAGNOSIS — I2510 Atherosclerotic heart disease of native coronary artery without angina pectoris: Secondary | ICD-10-CM

## 2017-08-03 DIAGNOSIS — K6389 Other specified diseases of intestine: Secondary | ICD-10-CM

## 2017-08-03 DIAGNOSIS — Z7982 Long term (current) use of aspirin: Secondary | ICD-10-CM

## 2017-08-03 DIAGNOSIS — K449 Diaphragmatic hernia without obstruction or gangrene: Secondary | ICD-10-CM | POA: Insufficient documentation

## 2017-08-03 LAB — CBC WITH DIFFERENTIAL/PLATELET
Basophils Absolute: 0.1 10*3/uL (ref 0–0.1)
Basophils Relative: 1 %
Eosinophils Absolute: 0.2 10*3/uL (ref 0–0.7)
Eosinophils Relative: 2 %
HEMATOCRIT: 35 % (ref 35.0–47.0)
HEMOGLOBIN: 11.6 g/dL — AB (ref 12.0–16.0)
LYMPHS ABS: 1.9 10*3/uL (ref 1.0–3.6)
Lymphocytes Relative: 21 %
MCH: 26.9 pg (ref 26.0–34.0)
MCHC: 33.1 g/dL (ref 32.0–36.0)
MCV: 81.2 fL (ref 80.0–100.0)
MONOS PCT: 6 %
Monocytes Absolute: 0.5 10*3/uL (ref 0.2–0.9)
NEUTROS ABS: 6.4 10*3/uL (ref 1.4–6.5)
NEUTROS PCT: 70 %
Platelets: 253 10*3/uL (ref 150–440)
RBC: 4.31 MIL/uL (ref 3.80–5.20)
RDW: 15.6 % — ABNORMAL HIGH (ref 11.5–14.5)
WBC: 9.2 10*3/uL (ref 3.6–11.0)

## 2017-08-03 LAB — COMPREHENSIVE METABOLIC PANEL
ALBUMIN: 3.4 g/dL — AB (ref 3.5–5.0)
ALK PHOS: 80 U/L (ref 38–126)
ALT: 23 U/L (ref 14–54)
AST: 31 U/L (ref 15–41)
Anion gap: 10 (ref 5–15)
BILIRUBIN TOTAL: 0.5 mg/dL (ref 0.3–1.2)
BUN: 22 mg/dL — ABNORMAL HIGH (ref 6–20)
CALCIUM: 7.8 mg/dL — AB (ref 8.9–10.3)
CO2: 29 mmol/L (ref 22–32)
CREATININE: 1.2 mg/dL — AB (ref 0.44–1.00)
Chloride: 101 mmol/L (ref 101–111)
GFR calc non Af Amer: 47 mL/min — ABNORMAL LOW (ref >60)
GFR, EST AFRICAN AMERICAN: 55 mL/min — AB (ref >60)
GLUCOSE: 303 mg/dL — AB (ref 65–99)
Potassium: 3 mmol/L — ABNORMAL LOW (ref 3.5–5.1)
Sodium: 140 mmol/L (ref 135–145)
TOTAL PROTEIN: 6.7 g/dL (ref 6.5–8.1)

## 2017-08-03 NOTE — Progress Notes (Signed)
Patient is here today for a follow up. Patient states no new concerns today.  

## 2017-08-03 NOTE — Progress Notes (Signed)
[facility] OFFICE PROGRESS NOTE  Patient Care Team: Soles, Howell Rucks, MD as PCP - General (Family Medicine)   SUMMARY OF ONCOLOGIC HISTORY:  # Anterior mesenteric nodule- 16x85mm [sep 2016; smaller; Feb 2016- S/p Bx- leiomyoma.]; Ilecolic LN [7PO; sep 2423; improving]. CT SEP 2017- NED [elevated chromogranin  Levels- slightly]  # Retro-tracheal duplication cyst/ dysphagia   INTERVAL HISTORY:  Patient is chronic back pain which is not any worse.  Denies any unusual nausea vomiting or diarrhea. Denies any chest pain or shortness of breath or cough. She has no swelling in the legs. No abdominal pain no nausea no vomiting. Patient does complain of fatigue.   REVIEW OF SYSTEMS:  A complete 10 point review of system is done which is negative except mentioned above/history of present illness.   PAST MEDICAL HISTORY :  Past Medical History:  Diagnosis Date  . Anemia   . Arthritis   . BBB (bundle branch block)   . Bradycardia   . Coronary artery disease   . Diabetes mellitus without complication ([place])   . DM type 2 with diabetic peripheral neuropathy ([place]) 09/19/2015  . Dysphagia   . Dysrhythmia   . GERD (gastroesophageal reflux disease)   . Headache   . History of hiatal hernia   . Hypertension   . Mesenteric mass   . Rotator cuff tendinitis, left   . Unspecified atrial fibrillation ([place])     PAST SURGICAL HISTORY :   Past Surgical History:  Procedure Laterality Date  . ABDOMINAL HYSTERECTOMY    . CHOLECYSTECTOMY    . COLONOSCOPY WITH PROPOFOL N/A 12/20/2016   Procedure: COLONOSCOPY WITH PROPOFOL;  Surgeon: Manya Silvas, MD;  Location: [facility] ENDOSCOPY;  Service: Endoscopy;  Laterality: N/A;  . CORONARY ARTERY BYPASS GRAFT    . ESOPHAGOGASTRODUODENOSCOPY (EGD) WITH PROPOFOL N/A 04/27/2017   Procedure: ESOPHAGOGASTRODUODENOSCOPY (EGD) WITH PROPOFOL;  Surgeon: Manya Silvas, MD;  Location: [facility] ENDOSCOPY;  Service: Endoscopy;  Laterality: N/A;  . EUS N/A  04/24/2015   Procedure: ESOPHAGEAL ENDOSCOPIC ULTRASOUND (EUS) RADIAL;  Surgeon: Cora Daniels, MD;  Location: [facility] ENDOSCOPY;  Service: Endoscopy;  Laterality: N/A;  . THYROIDECTOMY, PARTIAL      FAMILY HISTORY :   Family History  Problem Relation Age of Onset  . Breast cancer Neg Hx     SOCIAL HISTORY:   Social History  Substance Use Topics  . Smoking status: Never Smoker  . Smokeless tobacco: Never Used  . Alcohol use No    ALLERGIES:  is allergic to other; etodolac; iodinated diagnostic agents; and oxycodone-acetaminophen.  MEDICATIONS:  Current Outpatient Prescriptions  Medication Sig Dispense Refill  . amLODipine (NORVASC) 5 MG tablet Take 2.5 mg by mouth.    Marland Kitchen aspirin EC 81 MG tablet Take 1 tablet by mouth daily.    . Calcium Citrate-Vitamin D (CALCIUM CITRATE + D3 PO) Take 660 mg by mouth daily.     . Carboxymethylcellulose Sodium (GOODSENSE LUBRICATING EYE DROP OP) Apply 2 drops to eye daily.    . cholecalciferol (VITAMIN D) 1000 UNITS tablet Take 1 tablet by mouth daily.    . clotrimazole (LOTRIMIN) 1 % cream Apply 1 application topically 2 (two) times daily.    . ferrous sulfate 325 (65 FE) MG tablet Take 325 mg by mouth daily with breakfast.    . fluticasone (FLONASE) 50 MCG/ACT nasal spray Place 2 sprays into both nostrils daily.    . furosemide (LASIX) 40 MG tablet Take 1 tablet by mouth  daily.    . gabapentin (NEURONTIN) 300 MG capsule Take 1 capsule by mouth daily as needed (nerve pain).     . insulin aspart (NOVOLOG FLEXPEN) 100 UNIT/ML FlexPen Take by mouth.    . Insulin Glargine (LANTUS) 100 UNIT/ML Solostar Pen 50 Units every evening.    . lansoprazole (PREVACID) 30 MG capsule Take 30 mg by mouth daily before breakfast.    . Linaclotide (LINZESS) 290 MCG CAPS capsule Take 1 capsule by mouth daily.    . Liraglutide 18 MG/3ML SOPN Inject 1.8 mg into the skin daily.    . metFORMIN (GLUCOPHAGE-XR) 500 MG 24 hr tablet Take 500 mg by mouth daily.    .  mineral oil-hydrophilic petrolatum (AQUAPHOR) ointment Apply topically as needed for dry skin.    . potassium chloride SA (K-DUR,KLOR-CON) 20 MEQ tablet Take 2 tablets (40 mEq total) by mouth once. 14 tablet 0  . promethazine (PHENERGAN) 25 MG tablet Take 25 mg by mouth every 6 (six) hours as needed for nausea or vomiting.    . rosuvastatin (CRESTOR) 40 MG tablet Take 40 mg by mouth daily.     . sotalol (BETAPACE) 80 MG tablet Take 1 tablet by mouth 2 (two) times daily.    . Denture Care Products (EFFERDENT DENTURE CLEANSER) TBEF Take 1 tablet by mouth daily.    . diphenhydrAMINE (BENADRYL) 25 mg capsule Take 1 capsule (25 mg total) by mouth once. Pt should take benadryl 25 mg capsule 1 capsule 1 hr prior to ct scan procedure (Patient not taking: Reported on 08/03/2017) 1 capsule 0  . losartan (COZAAR) 100 MG tablet Take 100 mg by mouth.    . predniSONE (DELTASONE) 50 MG tablet take 1 tablet-13 hours prior to scan, 7 hours prior to scan & 1 hour prior to scan prior to scan (Patient not taking: Reported on 08/03/2017) 3 tablet 0  . ranitidine (ZANTAC) 150 MG tablet Take 1 tablet (150 mg total) by mouth once. Pt should Zantac 150 mg tablet 1 hr prior to the procedure. (Patient not taking: Reported on 08/03/2017) 1 tablet 0   No current facility-administered medications for this visit.     PHYSICAL EXAMINATION: ECOG PERFORMANCE STATUS: 0 - Asymptomatic  BP (!) 150/74 (BP Location: Left Arm, Patient Position: Sitting)   Pulse (!) 59   Temp (!) 97.4 F (36.3 C) (Tympanic)   Resp 16   Wt 261 lb 6.4 oz (118.6 kg)   BMI 40.94 kg/m   Filed Weights   08/03/17 1004 08/03/17 1008  Weight: 261 lb 6.4 oz (118.6 kg) 261 lb 6.4 oz (118.6 kg)    GENERAL: Well-nourished well-developed; Alert, no distress and comfortable.   Alone. EYES: no pallor or icterus OROPHARYNX: no thrush or ulceration; good dentition  NECK: supple, no masses felt LYMPH:  no palpable lymphadenopathy in the cervical, axillary or  inguinal regions LUNGS: clear to auscultation and  No wheeze or crackles HEART/CVS: regular rate & rhythm and no murmurs; No lower extremity edema ABDOMEN:abdomen soft, non-tender and normal bowel sounds Musculoskeletal:no cyanosis of digits and no clubbing  PSYCH: alert & oriented x 3 with fluent speech NEURO: no focal motor/sensory deficits SKIN:  no rashes or significant lesions  LABORATORY DATA:  I have reviewed the data as listed    Component Value Date/Time   NA 140 08/03/2017 0941   NA 136 03/10/2015 1640   K 3.0 (L) 08/03/2017 0941   K 2.7 (L) 03/10/2015 1640   CL 101 08/03/2017 0941  CL 96 (L) 03/10/2015 1640   CO2 29 08/03/2017 0941   CO2 31 03/10/2015 1640   GLUCOSE 303 (H) 08/03/2017 0941   GLUCOSE 99 03/10/2015 1640   BUN 22 (H) 08/03/2017 0941   BUN 16 03/10/2015 1640   CREATININE 1.20 (H) 08/03/2017 0941   CREATININE 1.18 (H) 03/10/2015 1640   CALCIUM 7.8 (L) 08/03/2017 0941   CALCIUM 9.0 03/10/2015 1640   PROT 6.7 08/03/2017 0941   PROT 7.7 03/10/2015 1640   ALBUMIN 3.4 (L) 08/03/2017 0941   ALBUMIN 4.1 03/10/2015 1640   AST 31 08/03/2017 0941   AST 34 03/10/2015 1640   ALT 23 08/03/2017 0941   ALT 39 03/10/2015 1640   ALKPHOS 80 08/03/2017 0941   ALKPHOS 60 03/10/2015 1640   BILITOT 0.5 08/03/2017 0941   BILITOT 0.4 03/10/2015 1640   GFRNONAA 47 (L) 08/03/2017 0941   GFRNONAA 50 (L) 03/10/2015 1640   GFRAA 55 (L) 08/03/2017 0941   GFRAA 58 (L) 03/10/2015 1640    No results found for: SPEP, UPEP  Lab Results  Component Value Date   WBC 9.2 08/03/2017   NEUTROABS 6.4 08/03/2017   HGB 11.6 (L) 08/03/2017   HCT 35.0 08/03/2017   MCV 81.2 08/03/2017   PLT 253 08/03/2017      Chemistry      Component Value Date/Time   NA 140 08/03/2017 0941   NA 136 03/10/2015 1640   K 3.0 (L) 08/03/2017 0941   K 2.7 (L) 03/10/2015 1640   CL 101 08/03/2017 0941   CL 96 (L) 03/10/2015 1640   CO2 29 08/03/2017 0941   CO2 31 03/10/2015 1640   BUN 22 (H)  08/03/2017 0941   BUN 16 03/10/2015 1640   CREATININE 1.20 (H) 08/03/2017 0941   CREATININE 1.18 (H) 03/10/2015 1640      Component Value Date/Time   CALCIUM 7.8 (L) 08/03/2017 0941   CALCIUM 9.0 03/10/2015 1640   ALKPHOS 80 08/03/2017 0941   ALKPHOS 60 03/10/2015 1640   AST 31 08/03/2017 0941   AST 34 03/10/2015 1640   ALT 23 08/03/2017 0941   ALT 39 03/10/2015 1640   BILITOT 0.5 08/03/2017 0941   BILITOT 0.4 03/10/2015 1640      IMPRESSION: 1. No acute abnormality. 2. Stable anterior right lower quadrant peritoneal 1.6 cm soft tissue nodule, for which at least 2 year stability has been demonstrated, considered benign. 3.  Aortic Atherosclerosis (ICD10-I70.0).  Coronary atherosclerosis.   Electronically Signed   By: Ilona Sorrel M.D.   On: 08/01/2017 12:36  ASSESSMENT & PLAN:   Mesenteric mass  # Anterior mesenteric nodule- 16x20mm [sep 2016; smaller];  S/p Bx- leiomyoma. SEP 2018- 1.7 cm ant mesentric nodulel stable for 2 years. Would not recommend any further imaging. Chromogranin nonspecific.  # Elevated Blood glucose- refer to PCP. Likely secondary causing patient's fatigue.   # patient follow-up with me in approximately 1 year/ labs in few days prior. If clinically stable then will recommend discharging the patient in the clinic. No scan.  Cc; Dr.Soles.      Cammie Sickle, MD 08/06/2017 1:45 AM

## 2017-08-03 NOTE — Assessment & Plan Note (Addendum)
#   Anterior mesenteric nodule- 16x61mm [sep 2016; smaller];  S/p Bx- leiomyoma. SEP 2018- 1.7 cm ant mesentric nodulel stable for 2 years. Would not recommend any further imaging. Chromogranin nonspecific.  # Elevated Blood glucose- refer to PCP. Likely secondary causing patient's fatigue.   # patient follow-up with me in approximately 1 year/ labs in few days prior. If clinically stable then will recommend discharging the patient in the clinic. No scan.  Cc; Dr.Soles.

## 2017-08-04 LAB — CHROMOGRANIN A: CHROMOGRANIN A: 11 nmol/L — AB (ref 0–5)

## 2017-11-11 ENCOUNTER — Other Ambulatory Visit: Payer: Self-pay | Admitting: Family Medicine

## 2017-11-11 DIAGNOSIS — Z1231 Encounter for screening mammogram for malignant neoplasm of breast: Secondary | ICD-10-CM

## 2017-12-15 ENCOUNTER — Ambulatory Visit
Admission: RE | Admit: 2017-12-15 | Discharge: 2017-12-15 | Disposition: A | Discharge status: Home / Self Care | Payer: Medicare HMO | Source: Ambulatory Visit | Attending: Family Medicine | Admitting: Family Medicine

## 2017-12-15 DIAGNOSIS — Z1231 Encounter for screening mammogram for malignant neoplasm of breast: Secondary | ICD-10-CM

## 2017-12-30 ENCOUNTER — Other Ambulatory Visit: Payer: Self-pay | Admitting: Family Medicine

## 2017-12-30 DIAGNOSIS — R51 Headache: Principal | ICD-10-CM

## 2017-12-30 DIAGNOSIS — R519 Headache, unspecified: Secondary | ICD-10-CM

## 2018-01-02 ENCOUNTER — Other Ambulatory Visit: Payer: Self-pay | Admitting: Family Medicine

## 2018-01-02 DIAGNOSIS — R519 Headache, unspecified: Secondary | ICD-10-CM

## 2018-01-02 DIAGNOSIS — R51 Headache: Principal | ICD-10-CM

## 2018-01-04 ENCOUNTER — Other Ambulatory Visit: Payer: Self-pay | Admitting: Nurse Practitioner

## 2018-01-04 DIAGNOSIS — R1011 Right upper quadrant pain: Secondary | ICD-10-CM

## 2018-01-11 ENCOUNTER — Ambulatory Visit
Admission: RE | Admit: 2018-01-11 | Discharge: 2018-01-11 | Disposition: A | Discharge status: Home / Self Care | Payer: Medicare HMO | Source: Ambulatory Visit | Attending: Nurse Practitioner | Admitting: Nurse Practitioner

## 2018-01-11 ENCOUNTER — Ambulatory Visit
Admission: RE | Admit: 2018-01-11 | Discharge: 2018-01-11 | Disposition: A | Discharge status: Home / Self Care | Payer: Medicare HMO | Source: Ambulatory Visit | Attending: Family Medicine | Admitting: Family Medicine

## 2018-01-11 DIAGNOSIS — Z9049 Acquired absence of other specified parts of digestive tract: Secondary | ICD-10-CM | POA: Insufficient documentation

## 2018-01-11 DIAGNOSIS — M50222 Other cervical disc displacement at C5-C6 level: Secondary | ICD-10-CM | POA: Diagnosis not present

## 2018-01-11 DIAGNOSIS — R1011 Right upper quadrant pain: Secondary | ICD-10-CM | POA: Diagnosis present

## 2018-01-11 DIAGNOSIS — R42 Dizziness and giddiness: Secondary | ICD-10-CM | POA: Diagnosis not present

## 2018-01-11 DIAGNOSIS — E049 Nontoxic goiter, unspecified: Secondary | ICD-10-CM | POA: Diagnosis not present

## 2018-01-11 DIAGNOSIS — R51 Headache: Secondary | ICD-10-CM | POA: Diagnosis not present

## 2018-01-11 DIAGNOSIS — R110 Nausea: Secondary | ICD-10-CM | POA: Insufficient documentation

## 2018-01-11 DIAGNOSIS — R519 Headache, unspecified: Secondary | ICD-10-CM

## 2018-01-11 LAB — POCT I-STAT CREATININE: Creatinine, Ser: 1.3 mg/dL — ABNORMAL HIGH (ref 0.44–1.00)

## 2018-01-11 MED ORDER — GADOBENATE DIMEGLUMINE 529 MG/ML IV SOLN
20.0000 mL | Freq: Once | INTRAVENOUS | Status: AC | PRN
  Administered 2018-01-11: 20 mL via INTRAVENOUS

## 2018-01-23 ENCOUNTER — Other Ambulatory Visit: Payer: Self-pay | Admitting: Orthopedic Surgery

## 2018-01-23 DIAGNOSIS — M1612 Unilateral primary osteoarthritis, left hip: Secondary | ICD-10-CM

## 2018-01-25 ENCOUNTER — Other Ambulatory Visit: Payer: Self-pay | Admitting: Family Medicine

## 2018-01-25 DIAGNOSIS — E049 Nontoxic goiter, unspecified: Secondary | ICD-10-CM

## 2018-02-07 ENCOUNTER — Ambulatory Visit
Admission: RE | Admit: 2018-02-07 | Discharge: 2018-02-07 | Disposition: A | Discharge status: Home / Self Care | Payer: Medicare HMO | Source: Ambulatory Visit | Attending: Orthopedic Surgery | Admitting: Orthopedic Surgery

## 2018-02-07 ENCOUNTER — Ambulatory Visit
Admission: RE | Admit: 2018-02-07 | Discharge: 2018-02-07 | Disposition: A | Discharge status: Home / Self Care | Payer: Medicare HMO | Source: Ambulatory Visit | Attending: Family Medicine | Admitting: Family Medicine

## 2018-02-07 DIAGNOSIS — E010 Iodine-deficiency related diffuse (endemic) goiter: Secondary | ICD-10-CM | POA: Insufficient documentation

## 2018-02-07 DIAGNOSIS — E049 Nontoxic goiter, unspecified: Secondary | ICD-10-CM

## 2018-02-07 DIAGNOSIS — M1612 Unilateral primary osteoarthritis, left hip: Secondary | ICD-10-CM

## 2018-02-07 DIAGNOSIS — E042 Nontoxic multinodular goiter: Secondary | ICD-10-CM | POA: Diagnosis not present

## 2018-02-07 DIAGNOSIS — M25852 Other specified joint disorders, left hip: Secondary | ICD-10-CM | POA: Insufficient documentation

## 2018-02-14 ENCOUNTER — Other Ambulatory Visit: Payer: Self-pay | Admitting: Registered Nurse

## 2018-02-14 DIAGNOSIS — E049 Nontoxic goiter, unspecified: Secondary | ICD-10-CM

## 2018-02-15 DIAGNOSIS — M461 Sacroiliitis, not elsewhere classified: Secondary | ICD-10-CM | POA: Insufficient documentation

## 2018-03-01 ENCOUNTER — Ambulatory Visit
Admission: RE | Admit: 2018-03-01 | Discharge: 2018-03-01 | Disposition: A | Discharge status: Home / Self Care | Payer: Medicare HMO | Source: Ambulatory Visit | Attending: Registered Nurse | Admitting: Registered Nurse

## 2018-03-01 DIAGNOSIS — N189 Chronic kidney disease, unspecified: Secondary | ICD-10-CM | POA: Insufficient documentation

## 2018-03-01 DIAGNOSIS — E049 Nontoxic goiter, unspecified: Secondary | ICD-10-CM

## 2018-03-01 DIAGNOSIS — M1990 Unspecified osteoarthritis, unspecified site: Secondary | ICD-10-CM | POA: Diagnosis not present

## 2018-03-01 DIAGNOSIS — E1122 Type 2 diabetes mellitus with diabetic chronic kidney disease: Secondary | ICD-10-CM | POA: Insufficient documentation

## 2018-04-28 ENCOUNTER — Encounter: Payer: Self-pay | Admitting: Internal Medicine

## 2018-05-19 ENCOUNTER — Inpatient Hospital Stay: Payer: Medicare HMO | Attending: Internal Medicine

## 2018-05-19 ENCOUNTER — Other Ambulatory Visit: Payer: Self-pay | Admitting: Internal Medicine

## 2018-05-19 DIAGNOSIS — R978 Other abnormal tumor markers: Secondary | ICD-10-CM | POA: Diagnosis not present

## 2018-05-19 DIAGNOSIS — K639 Disease of intestine, unspecified: Secondary | ICD-10-CM | POA: Diagnosis present

## 2018-05-19 DIAGNOSIS — K6389 Other specified diseases of intestine: Secondary | ICD-10-CM

## 2018-05-19 LAB — CBC WITH DIFFERENTIAL/PLATELET
Basophils Absolute: 0.1 10*3/uL (ref 0–0.1)
Basophils Relative: 1 %
Eosinophils Absolute: 0.1 10*3/uL (ref 0–0.7)
Eosinophils Relative: 1 %
HCT: 34.7 % — ABNORMAL LOW (ref 35.0–47.0)
Hemoglobin: 11.6 g/dL — ABNORMAL LOW (ref 12.0–16.0)
LYMPHS ABS: 2.6 10*3/uL (ref 1.0–3.6)
LYMPHS PCT: 29 %
MCH: 28.6 pg (ref 26.0–34.0)
MCHC: 33.6 g/dL (ref 32.0–36.0)
MCV: 85.2 fL (ref 80.0–100.0)
Monocytes Absolute: 0.7 10*3/uL (ref 0.2–0.9)
Monocytes Relative: 8 %
Neutro Abs: 5.7 10*3/uL (ref 1.4–6.5)
Neutrophils Relative %: 61 %
PLATELETS: 295 10*3/uL (ref 150–440)
RBC: 4.07 MIL/uL (ref 3.80–5.20)
RDW: 13.8 % (ref 11.5–14.5)
WBC: 9.3 10*3/uL (ref 3.6–11.0)

## 2018-05-19 LAB — COMPREHENSIVE METABOLIC PANEL
ALBUMIN: 3.8 g/dL (ref 3.5–5.0)
ALT: 32 U/L (ref 0–44)
ANION GAP: 9 (ref 5–15)
AST: 31 U/L (ref 15–41)
Alkaline Phosphatase: 72 U/L (ref 38–126)
BILIRUBIN TOTAL: 0.5 mg/dL (ref 0.3–1.2)
BUN: 29 mg/dL — AB (ref 8–23)
CHLORIDE: 108 mmol/L (ref 98–111)
CO2: 24 mmol/L (ref 22–32)
Calcium: 9 mg/dL (ref 8.9–10.3)
Creatinine, Ser: 1.81 mg/dL — ABNORMAL HIGH (ref 0.44–1.00)
GFR calc Af Amer: 33 mL/min — ABNORMAL LOW (ref >60)
GFR, EST NON AFRICAN AMERICAN: 28 mL/min — AB (ref >60)
Glucose, Bld: 106 mg/dL — ABNORMAL HIGH (ref 70–99)
POTASSIUM: 4.2 mmol/L (ref 3.5–5.1)
Sodium: 141 mmol/L (ref 135–145)
TOTAL PROTEIN: 7.3 g/dL (ref 6.5–8.1)

## 2018-05-22 ENCOUNTER — Inpatient Hospital Stay: Payer: Medicare HMO | Attending: Internal Medicine | Admitting: Internal Medicine

## 2018-05-22 DIAGNOSIS — M1990 Unspecified osteoarthritis, unspecified site: Secondary | ICD-10-CM | POA: Insufficient documentation

## 2018-05-22 DIAGNOSIS — I10 Essential (primary) hypertension: Secondary | ICD-10-CM | POA: Diagnosis not present

## 2018-05-22 DIAGNOSIS — N289 Disorder of kidney and ureter, unspecified: Secondary | ICD-10-CM | POA: Insufficient documentation

## 2018-05-22 DIAGNOSIS — R1031 Right lower quadrant pain: Secondary | ICD-10-CM | POA: Insufficient documentation

## 2018-05-22 DIAGNOSIS — Z794 Long term (current) use of insulin: Secondary | ICD-10-CM | POA: Diagnosis not present

## 2018-05-22 DIAGNOSIS — I700 Atherosclerosis of aorta: Secondary | ICD-10-CM | POA: Diagnosis not present

## 2018-05-22 DIAGNOSIS — K639 Disease of intestine, unspecified: Secondary | ICD-10-CM

## 2018-05-22 DIAGNOSIS — E119 Type 2 diabetes mellitus without complications: Secondary | ICD-10-CM | POA: Insufficient documentation

## 2018-05-22 DIAGNOSIS — I2510 Atherosclerotic heart disease of native coronary artery without angina pectoris: Secondary | ICD-10-CM | POA: Insufficient documentation

## 2018-05-22 DIAGNOSIS — N179 Acute kidney failure, unspecified: Secondary | ICD-10-CM | POA: Diagnosis not present

## 2018-05-22 DIAGNOSIS — Z7982 Long term (current) use of aspirin: Secondary | ICD-10-CM | POA: Diagnosis not present

## 2018-05-22 DIAGNOSIS — I880 Nonspecific mesenteric lymphadenitis: Secondary | ICD-10-CM | POA: Insufficient documentation

## 2018-05-22 DIAGNOSIS — K6389 Other specified diseases of intestine: Secondary | ICD-10-CM

## 2018-05-22 DIAGNOSIS — K219 Gastro-esophageal reflux disease without esophagitis: Secondary | ICD-10-CM | POA: Diagnosis not present

## 2018-05-22 DIAGNOSIS — R5383 Other fatigue: Secondary | ICD-10-CM | POA: Diagnosis not present

## 2018-05-22 DIAGNOSIS — Z79899 Other long term (current) drug therapy: Secondary | ICD-10-CM | POA: Diagnosis not present

## 2018-05-22 DIAGNOSIS — R4702 Dysphasia: Secondary | ICD-10-CM | POA: Diagnosis not present

## 2018-05-22 DIAGNOSIS — E079 Disorder of thyroid, unspecified: Secondary | ICD-10-CM | POA: Diagnosis not present

## 2018-05-22 NOTE — Progress Notes (Signed)
[name] OFFICE PROGRESS NOTE  Patient Care Team: Alene Mires Elyse Jarvis, MD as PCP - General (Family Medicine)   SUMMARY OF ONCOLOGIC HISTORY:  # Anterior mesenteric nodule- 16x52mm [sep 2016; smaller; Feb 2016- S/p Bx- leiomyoma.]; Ilecolic LN [5TI; sep 1443; improving]. CT SEP 2017- NED [elevated chromogranin  Levels- slightly]  # Retro-tracheal duplication cyst/ dysphagia   INTERVAL HISTORY:   62 year old female patient history of  abdominal/anterior mesenteric nodule is here for follow-up.  Patient complains of worsening right lower quadrant abdominal pain.  Denies any constipation or diarrhea.  Denies any nausea vomiting.  4/-5 on a scale of 10 radiates to the right groin.   Patient does complain of fatigue.    REVIEW OF SYSTEMS:  A complete 10 point review of system is done which is negative except mentioned above/history of present illness.   PAST MEDICAL HISTORY :  Past Medical History:  Diagnosis Date  . Anemia   . Arthritis   . BBB (bundle branch block)   . Bradycardia   . Coronary artery disease   . Diabetes mellitus without complication ([place])   . 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 Dysphagia   . Dysrhythmia   . GERD (gastroesophageal reflux disease)   . Headache   . History of hiatal hernia   . Hypertension   . Mesenteric mass   . Rotator cuff tendinitis, left   . Unspecified atrial fibrillation ([place])     PAST SURGICAL HISTORY :   Past Surgical History:  Procedure Laterality Date  . ABDOMINAL HYSTERECTOMY    . CHOLECYSTECTOMY    . COLONOSCOPY WITH PROPOFOL N/A 12/20/2016   Procedure: COLONOSCOPY WITH PROPOFOL;  Surgeon: Manya Silvas, MD;  Location: [facility] ENDOSCOPY;  Service: Endoscopy;  Laterality: N/A;  . CORONARY ARTERY BYPASS GRAFT    . ESOPHAGOGASTRODUODENOSCOPY (EGD) WITH PROPOFOL N/A 04/27/2017   Procedure: ESOPHAGOGASTRODUODENOSCOPY (EGD) WITH PROPOFOL;  Surgeon: Manya Silvas, MD;   Location: [facility] ENDOSCOPY;  Service: Endoscopy;  Laterality: N/A;  . EUS N/A 04/24/2015   Procedure: ESOPHAGEAL ENDOSCOPIC ULTRASOUND (EUS) RADIAL;  Surgeon: Cora Daniels, MD;  Location: [facility] ENDOSCOPY;  Service: Endoscopy;  Laterality: N/A;  . THYROIDECTOMY, PARTIAL      FAMILY HISTORY :   Family History  Problem Relation Age of Onset  . Breast cancer Neg Hx     SOCIAL HISTORY:   Social History   Tobacco Use  . Smoking status: Never Smoker  . Smokeless tobacco: Never Used  Substance Use Topics  . Alcohol use: No    Alcohol/week: 0.0 oz  . Drug use: No    ALLERGIES:  is allergic to other; etodolac; iodinated diagnostic agents; and oxycodone-acetaminophen.  MEDICATIONS:  Current Outpatient Medications  Medication Sig Dispense Refill  . spironolactone (ALDACTONE) 100 MG tablet Take 100 mg by mouth daily.    Marland Kitchen amLODipine (NORVASC) 5 MG tablet Take 2.5 mg by mouth.    Marland Kitchen aspirin EC 81 MG tablet Take 1 tablet by mouth daily.    . Calcium Citrate-Vitamin D (CALCIUM CITRATE + D3 PO) Take 660 mg by mouth daily.     . clotrimazole (LOTRIMIN) 1 % cream Apply 1 application topically 2 (two) times daily.    . Denture Care Products (EFFERDENT DENTURE CLEANSER) TBEF Take 1 tablet by mouth daily.    . diphenhydrAMINE (BENADRYL) 25 mg capsule Take 1 capsule (25 mg total) by mouth once. Pt should take benadryl 25 mg capsule 1 capsule 1  hr prior to ct scan procedure (Patient not taking: Reported on 08/03/2017) 1 capsule 0  . furosemide (LASIX) 40 MG tablet Take 1 tablet by mouth daily.    Marland Kitchen gabapentin (NEURONTIN) 300 MG capsule Take 1 capsule by mouth daily as needed (nerve pain).     . insulin aspart (NOVOLOG FLEXPEN) 100 UNIT/ML FlexPen Take by mouth.    . Insulin Glargine (LANTUS) 100 UNIT/ML Solostar Pen 50 Units every evening.    . lansoprazole (PREVACID) 30 MG capsule Take 30 mg by mouth daily before breakfast.    . Linaclotide (LINZESS) 290 MCG CAPS capsule Take 1 capsule by mouth  daily.    Marland Kitchen losartan (COZAAR) 100 MG tablet Take 100 mg by mouth.    . metFORMIN (GLUCOPHAGE-XR) 500 MG 24 hr tablet Take 500 mg by mouth daily.    . potassium chloride SA (K-DUR,KLOR-CON) 20 MEQ tablet Take 2 tablets (40 mEq total) by mouth once. 14 tablet 0  . promethazine (PHENERGAN) 25 MG tablet Take 25 mg by mouth every 6 (six) hours as needed for nausea or vomiting.    . rosuvastatin (CRESTOR) 40 MG tablet Take 40 mg by mouth daily.     . sotalol (BETAPACE) 80 MG tablet Take 1 tablet by mouth 2 (two) times daily.    Marland Kitchen telmisartan (MICARDIS) 80 MG tablet      No current facility-administered medications for this visit.     PHYSICAL EXAMINATION: ECOG PERFORMANCE STATUS: 0 - Asymptomatic  There were no vitals taken for this visit.  There were no vitals filed for this visit.  GENERAL: Well-nourished well-developed; Alert, no distress and comfortable.   Alone. EYES: no pallor or icterus OROPHARYNX: no thrush or ulceration; good dentition  NECK: supple, no masses felt LYMPH:  no palpable lymphadenopathy in the cervical, axillary or inguinal regions LUNGS: clear to auscultation and  No wheeze or crackles HEART/CVS: regular rate & rhythm and no murmurs; No lower extremity edema ABDOMEN:abdomen soft, non-tender and normal bowel sounds Musculoskeletal:no cyanosis of digits and no clubbing  PSYCH: alert & oriented x 3 with fluent speech NEURO: no focal motor/sensory deficits SKIN:  no rashes or significant lesions  LABORATORY DATA:  I have reviewed the data as listed    Component Value Date/Time   NA 141 05/19/2018 1122   NA 136 03/10/2015 1640   K 4.2 05/19/2018 1122   K 2.7 (L) 03/10/2015 1640   CL 108 05/19/2018 1122   CL 96 (L) 03/10/2015 1640   CO2 24 05/19/2018 1122   CO2 31 03/10/2015 1640   GLUCOSE 106 (H) 05/19/2018 1122   GLUCOSE 99 03/10/2015 1640   BUN 29 (H) 05/19/2018 1122   BUN 16 03/10/2015 1640   CREATININE 1.81 (H) 05/19/2018 1122   CREATININE 1.18 (H)  03/10/2015 1640   CALCIUM 9.0 05/19/2018 1122   CALCIUM 9.0 03/10/2015 1640   PROT 7.3 05/19/2018 1122   PROT 7.7 03/10/2015 1640   ALBUMIN 3.8 05/19/2018 1122   ALBUMIN 4.1 03/10/2015 1640   AST 31 05/19/2018 1122   AST 34 03/10/2015 1640   ALT 32 05/19/2018 1122   ALT 39 03/10/2015 1640   ALKPHOS 72 05/19/2018 1122   ALKPHOS 60 03/10/2015 1640   BILITOT 0.5 05/19/2018 1122   BILITOT 0.4 03/10/2015 1640   GFRNONAA 28 (L) 05/19/2018 1122   GFRNONAA 50 (L) 03/10/2015 1640   GFRAA 33 (L) 05/19/2018 1122   GFRAA 58 (L) 03/10/2015 1640    No results found for: SPEP,  UPEP  Lab Results  Component Value Date   WBC 9.3 05/19/2018   NEUTROABS 5.7 05/19/2018   HGB 11.6 (L) 05/19/2018   HCT 34.7 (L) 05/19/2018   MCV 85.2 05/19/2018   PLT 295 05/19/2018      Chemistry      Component Value Date/Time   NA 141 05/19/2018 1122   NA 136 03/10/2015 1640   K 4.2 05/19/2018 1122   K 2.7 (L) 03/10/2015 1640   CL 108 05/19/2018 1122   CL 96 (L) 03/10/2015 1640   CO2 24 05/19/2018 1122   CO2 31 03/10/2015 1640   BUN 29 (H) 05/19/2018 1122   BUN 16 03/10/2015 1640   CREATININE 1.81 (H) 05/19/2018 1122   CREATININE 1.18 (H) 03/10/2015 1640      Component Value Date/Time   CALCIUM 9.0 05/19/2018 1122   CALCIUM 9.0 03/10/2015 1640   ALKPHOS 72 05/19/2018 1122   ALKPHOS 60 03/10/2015 1640   AST 31 05/19/2018 1122   AST 34 03/10/2015 1640   ALT 32 05/19/2018 1122   ALT 39 03/10/2015 1640   BILITOT 0.5 05/19/2018 1122   BILITOT 0.4 03/10/2015 1640      IMPRESSION: 1. No acute abnormality. 2. Stable anterior right lower quadrant peritoneal 1.6 cm soft tissue nodule, for which at least 2 year stability has been demonstrated, considered benign. 3.  Aortic Atherosclerosis (ICD10-I70.0).  Coronary atherosclerosis.   Electronically Signed   By: Ilona Sorrel M.D.   On: 08/01/2017 12:36  ASSESSMENT & PLAN:   Mesenteric mass # Anterior mesenteric nodule- 16x33mm [sep 2016;  smaller];  S/p Bx- leiomyoma. SEP 2018- 1.7 cm ant mesentric nodulel stable for 2 years; however patient complains of mild worsening of the right lower quadrant abdominal pain.  See discussion below  # Renal function- BL- 1.2; today- 1.8. No NSAIDs.[ Lasix q OD]; etiology of worsening renal function is unclear.  Recommend renal ultrasound.   # DM- better controlled.  # Thyroid nodules- followed by Dr.Solum.   #Addendum: Renal ultrasound-negative for any hydronephrosis; given the presence of worsening right quadrant abdominal pain-recommend CT of the abdomen pelvis noncontrast.  Follow-up to be decided upon results of the test.  Chromogranin A elevated 45.  CT scan is nondiagnostic; will review the tumor conference  Cc; [facility] clinic- [phone].       Cammie Sickle, MD 05/25/2018 9:15 PM

## 2018-05-22 NOTE — Assessment & Plan Note (Addendum)
#   Anterior mesenteric nodule- 16x23mm [sep 2016; smaller];  S/p Bx- leiomyoma. SEP 2018- 1.7 cm ant mesentric nodulel stable for 2 years; however patient complains of mild worsening of the right lower quadrant abdominal pain.  See discussion below  # Renal function- BL- 1.2; today- 1.8. No NSAIDs.[ Lasix q OD]; etiology of worsening renal function is unclear.  Recommend renal ultrasound.   # DM- better controlled.  # Thyroid nodules- followed by Dr.Solum.   #Addendum: Renal ultrasound-negative for any hydronephrosis; given the presence of worsening right quadrant abdominal pain-recommend CT of the abdomen pelvis noncontrast.  Follow-up to be decided upon results of the test.  Chromogranin A elevated 45.  CT scan is nondiagnostic; will review the tumor conference  Cc; [facility] clinic- [phone].

## 2018-05-23 LAB — CHROMOGRANIN A: Chromogranin A: 45 nmol/L — ABNORMAL HIGH (ref 0–5)

## 2018-05-24 ENCOUNTER — Ambulatory Visit
Admission: RE | Admit: 2018-05-24 | Discharge: 2018-05-24 | Disposition: A | Discharge status: Home / Self Care | Payer: Medicare HMO | Source: Ambulatory Visit | Attending: Internal Medicine | Admitting: Internal Medicine

## 2018-05-24 DIAGNOSIS — N281 Cyst of kidney, acquired: Secondary | ICD-10-CM | POA: Insufficient documentation

## 2018-05-24 DIAGNOSIS — N179 Acute kidney failure, unspecified: Secondary | ICD-10-CM | POA: Insufficient documentation

## 2018-05-25 ENCOUNTER — Telehealth: Payer: Self-pay | Admitting: Internal Medicine

## 2018-05-25 NOTE — Telephone Encounter (Signed)
Please inform patient that her ultrasound of the kidneys-showed no obstruction; to explain the worsening renal function or abdominal pain.  Recommend a noncontrast CT scan-only with oral contrast-for further evaluation of abdominal pain.  Follow-up to be based on results of the CT scan. Thx. GB

## 2018-05-26 NOTE — Telephone Encounter (Signed)
I left message for patient to return phone call.   

## 2018-05-31 NOTE — Telephone Encounter (Signed)
Patient returned phone call and I notified her of CT scan being scheduled & Dr. Sharmaine Base recommendations. Patient verbalized understanding.

## 2018-05-31 NOTE — Telephone Encounter (Signed)
I left 2nd message for patient to return phone call.

## 2018-06-02 ENCOUNTER — Ambulatory Visit
Admission: RE | Admit: 2018-06-02 | Discharge: 2018-06-02 | Disposition: A | Discharge status: Home / Self Care | Payer: Medicare HMO | Source: Ambulatory Visit | Attending: Internal Medicine | Admitting: Internal Medicine

## 2018-06-02 DIAGNOSIS — R1031 Right lower quadrant pain: Secondary | ICD-10-CM | POA: Insufficient documentation

## 2018-06-05 ENCOUNTER — Ambulatory Visit
Admission: RE | Admit: 2018-06-05 | Discharge: 2018-06-05 | Disposition: A | Discharge status: Home / Self Care | Payer: Medicare HMO | Source: Ambulatory Visit | Attending: Internal Medicine | Admitting: Internal Medicine

## 2018-06-05 DIAGNOSIS — R1031 Right lower quadrant pain: Secondary | ICD-10-CM | POA: Diagnosis present

## 2018-06-05 DIAGNOSIS — I700 Atherosclerosis of aorta: Secondary | ICD-10-CM | POA: Diagnosis not present

## 2018-06-05 DIAGNOSIS — N8110 Cystocele, unspecified: Secondary | ICD-10-CM | POA: Insufficient documentation

## 2018-06-05 DIAGNOSIS — I2510 Atherosclerotic heart disease of native coronary artery without angina pectoris: Secondary | ICD-10-CM | POA: Insufficient documentation

## 2018-08-02 ENCOUNTER — Other Ambulatory Visit: Payer: Medicare HMO

## 2018-08-04 ENCOUNTER — Ambulatory Visit: Payer: Medicare HMO | Admitting: Internal Medicine

## 2018-08-08 ENCOUNTER — Other Ambulatory Visit: Payer: Self-pay | Admitting: Physical Medicine and Rehabilitation

## 2018-08-08 DIAGNOSIS — M5442 Lumbago with sciatica, left side: Secondary | ICD-10-CM

## 2018-08-15 DIAGNOSIS — G8929 Other chronic pain: Secondary | ICD-10-CM | POA: Insufficient documentation

## 2018-08-15 DIAGNOSIS — M791 Myalgia, unspecified site: Secondary | ICD-10-CM | POA: Insufficient documentation

## 2018-08-15 LAB — HM HEPATITIS C SCREENING LAB: HM Hepatitis Screen: NEGATIVE

## 2018-08-17 ENCOUNTER — Ambulatory Visit: Payer: Medicare HMO

## 2018-08-29 ENCOUNTER — Ambulatory Visit: Admission: RE | Admit: 2018-08-29 | Payer: Medicare HMO | Source: Ambulatory Visit

## 2018-08-30 DIAGNOSIS — R700 Elevated erythrocyte sedimentation rate: Secondary | ICD-10-CM | POA: Insufficient documentation

## 2018-08-30 DIAGNOSIS — R749 Abnormal serum enzyme level, unspecified: Secondary | ICD-10-CM | POA: Insufficient documentation

## 2018-09-05 ENCOUNTER — Telehealth: Payer: Self-pay | Admitting: Internal Medicine

## 2018-09-05 NOTE — Telephone Encounter (Signed)
We will review pt's CT scan at tumor conference next week.

## 2018-09-12 DIAGNOSIS — E1159 Type 2 diabetes mellitus with other circulatory complications: Secondary | ICD-10-CM | POA: Insufficient documentation

## 2018-09-12 DIAGNOSIS — E113293 Type 2 diabetes mellitus with mild nonproliferative diabetic retinopathy without macular edema, bilateral: Secondary | ICD-10-CM | POA: Insufficient documentation

## 2018-09-12 DIAGNOSIS — E1165 Type 2 diabetes mellitus with hyperglycemia: Secondary | ICD-10-CM | POA: Insufficient documentation

## 2018-09-13 ENCOUNTER — Ambulatory Visit: Payer: Medicare HMO

## 2018-09-14 ENCOUNTER — Other Ambulatory Visit (INDEPENDENT_AMBULATORY_CARE_PROVIDER_SITE_OTHER): Payer: Self-pay | Admitting: Nurse Practitioner

## 2018-09-14 DIAGNOSIS — R109 Unspecified abdominal pain: Secondary | ICD-10-CM

## 2018-09-15 ENCOUNTER — Inpatient Hospital Stay: Payer: Medicare HMO | Attending: Internal Medicine | Admitting: Internal Medicine

## 2018-09-15 ENCOUNTER — Other Ambulatory Visit: Payer: Self-pay

## 2018-09-15 VITALS — BP 119/76 | HR 82 | Temp 96.3°F | Resp 18 | Wt 242.4 lb

## 2018-09-15 DIAGNOSIS — M549 Dorsalgia, unspecified: Secondary | ICD-10-CM | POA: Insufficient documentation

## 2018-09-15 DIAGNOSIS — R700 Elevated erythrocyte sedimentation rate: Secondary | ICD-10-CM

## 2018-09-15 DIAGNOSIS — Z7982 Long term (current) use of aspirin: Secondary | ICD-10-CM | POA: Diagnosis not present

## 2018-09-15 DIAGNOSIS — M1990 Unspecified osteoarthritis, unspecified site: Secondary | ICD-10-CM

## 2018-09-15 DIAGNOSIS — I2510 Atherosclerotic heart disease of native coronary artery without angina pectoris: Secondary | ICD-10-CM | POA: Insufficient documentation

## 2018-09-15 DIAGNOSIS — K6389 Other specified diseases of intestine: Secondary | ICD-10-CM | POA: Diagnosis not present

## 2018-09-15 DIAGNOSIS — E042 Nontoxic multinodular goiter: Secondary | ICD-10-CM | POA: Diagnosis not present

## 2018-09-15 DIAGNOSIS — Z794 Long term (current) use of insulin: Secondary | ICD-10-CM

## 2018-09-15 DIAGNOSIS — E1122 Type 2 diabetes mellitus with diabetic chronic kidney disease: Secondary | ICD-10-CM | POA: Diagnosis not present

## 2018-09-15 DIAGNOSIS — D472 Monoclonal gammopathy: Secondary | ICD-10-CM | POA: Insufficient documentation

## 2018-09-15 DIAGNOSIS — R042 Hemoptysis: Secondary | ICD-10-CM

## 2018-09-15 DIAGNOSIS — I700 Atherosclerosis of aorta: Secondary | ICD-10-CM | POA: Diagnosis not present

## 2018-09-15 DIAGNOSIS — R5383 Other fatigue: Secondary | ICD-10-CM | POA: Insufficient documentation

## 2018-09-15 DIAGNOSIS — R5381 Other malaise: Secondary | ICD-10-CM | POA: Diagnosis not present

## 2018-09-15 DIAGNOSIS — I129 Hypertensive chronic kidney disease with stage 1 through stage 4 chronic kidney disease, or unspecified chronic kidney disease: Secondary | ICD-10-CM | POA: Diagnosis not present

## 2018-09-15 DIAGNOSIS — Z79899 Other long term (current) drug therapy: Secondary | ICD-10-CM

## 2018-09-15 DIAGNOSIS — K219 Gastro-esophageal reflux disease without esophagitis: Secondary | ICD-10-CM | POA: Diagnosis not present

## 2018-09-15 DIAGNOSIS — G8929 Other chronic pain: Secondary | ICD-10-CM | POA: Diagnosis not present

## 2018-09-15 DIAGNOSIS — N183 Chronic kidney disease, stage 3 (moderate): Secondary | ICD-10-CM

## 2018-09-15 NOTE — Progress Notes (Signed)
[place] OFFICE PROGRESS NOTE  Patient Care Team: [place], Elyse Jarvis, MD as PCP - General (Family Medicine)   SUMMARY OF ONCOLOGIC HISTORY: #October 2019-MGUS IgA kappa 0.  2 g/dL [Dr.Balch; elevated ESR];   #  Anterior mesenteric nodule- 16x94m [sep 2016; smaller; Feb 2016- S/p Bx- leiomyoma.]; Ilecolic LN [[9FW sep 22637 improving].2016- Octreotide scan- NEG;  CT SEP 2017- NED [elevated chromogranin  Levels- slightly]; July 2019- CT STABLE; Oct 2019- MDTC- imaging reviewed; No further surveillaince  # CKD stage III/diabetes; hemoglobin 11.5  # Retro-tracheal duplication cyst/ dysphagia   INTERVAL HISTORY:   62year old female patient history of  abdominal/anterior mesenteric nodule is here for follow-up.  In the interim patient was evaluated by rheumatology for elevated ESR/fatigue/diffuse body aches joint pains.  As part of the work-up patient was noted to have IgA monoclonal protein.  Patient is awaiting MRI of the back.  Patient continue with chronic back pain joint pains.  Denies abdominal pain nausea vomiting.  Ongoing fatigue.  Review of Systems  Constitutional: Positive for malaise/fatigue. Negative for chills, diaphoresis, fever and weight loss.  HENT: Negative for nosebleeds and sore throat.   Eyes: Negative for double vision.  Respiratory: Negative for cough, hemoptysis, sputum production, shortness of breath and wheezing.   Cardiovascular: Negative for chest pain, palpitations, orthopnea and leg swelling.  Gastrointestinal: Negative for abdominal pain, blood in stool, constipation, diarrhea, heartburn, melena, nausea and vomiting.  Genitourinary: Negative for dysuria, frequency and urgency.  Musculoskeletal: Positive for back pain and joint pain.  Skin: Negative.  Negative for itching and rash.  Neurological: Negative for dizziness, tingling, focal weakness, weakness and headaches.  Endo/Heme/Allergies: Does not bruise/bleed easily.   Psychiatric/Behavioral: Negative for depression. The patient is not nervous/anxious and does not have insomnia.      PAST MEDICAL HISTORY :  Past Medical History:  Diagnosis Date  . Anemia   . Arthritis   . BBB (bundle branch block)   . Bradycardia   . Coronary artery disease   . Diabetes mellitus without complication ([place]   . [place] 09[place]  . Dysphagia   . Dysrhythmia   . GERD (gastroesophageal reflux disease)   . Headache   . History of hiatal hernia   . Hypertension   . Mesenteric mass   . Rotator cuff tendinitis, left   . Unspecified atrial fibrillation ([place]     PAST SURGICAL HISTORY :   Past Surgical History:  Procedure Laterality Date  . ABDOMINAL HYSTERECTOMY    . CHOLECYSTECTOMY    . COLONOSCOPY WITH PROPOFOL N/A 12/20/2016   Procedure: COLONOSCOPY WITH PROPOFOL;  Surgeon: RManya Silvas MD;  [facility];  Service: Endoscopy;  Laterality: N/A;  . CORONARY ARTERY BYPASS GRAFT    . ESOPHAGOGASTRODUODENOSCOPY (EGD) WITH PROPOFOL N/A 04/27/2017   Procedure: ESOPHAGOGASTRODUODENOSCOPY (EGD) WITH PROPOFOL;  Surgeon: EManya Silvas MD;  [facility];  Service: Endoscopy;  Laterality: N/A;  . EUS N/A 04/24/2015   Procedure: ESOPHAGEAL ENDOSCOPIC ULTRASOUND (EUS) RADIAL;  Surgeon: MCora Daniels MD;  [facility];  Service: Endoscopy;  Laterality: N/A;  . THYROIDECTOMY, PARTIAL      FAMILY HISTORY :   Family History  Problem Relation Age of Onset  . Breast cancer Neg Hx     SOCIAL HISTORY:   Social History   Tobacco Use  . Smoking status: Never Smoker  . Smokeless tobacco: Never Used  Substance Use Topics  . Alcohol use:  No    Alcohol/week: 0.0 standard drinks  . Drug use: No    ALLERGIES:  is allergic to other; etodolac; iodinated diagnostic agents; and oxycodone-acetaminophen.  MEDICATIONS:  Current Outpatient Medications  Medication Sig Dispense Refill   . amLODipine (NORVASC) 5 MG tablet Take 2.5 mg by mouth.    Marland Kitchen aspirin EC 81 MG tablet Take 1 tablet by mouth daily.    . Calcium Citrate-Vitamin D (CALCIUM CITRATE + D3 PO) Take 660 mg by mouth daily.     . ciprofloxacin (CIPRO) 500 MG tablet Take 500 mg by mouth 2 (two) times daily.     . Exenatide ER (BYDUREON) 2 MG PEN INJECT 2MG SUBCUTANEOUSLY ONE TIME WEEKLY    . furosemide (LASIX) 40 MG tablet Take 1 tablet by mouth daily.    Marland Kitchen gabapentin (NEURONTIN) 300 MG capsule Take 1 capsule by mouth daily as needed (nerve pain).     . insulin aspart (NOVOLOG FLEXPEN) 100 UNIT/ML FlexPen Take by mouth.     . Insulin Glargine (LANTUS) 100 UNIT/ML Solostar Pen Inject 38 Units into the skin at bedtime.     . lansoprazole (PREVACID) 30 MG capsule Take 30 mg by mouth daily before breakfast.    . Linaclotide (LINZESS) 290 MCG CAPS capsule Take 1 capsule by mouth daily.    . metFORMIN (GLUCOPHAGE-XR) 500 MG 24 hr tablet Take 500 mg by mouth daily.     . potassium chloride SA (K-DUR,KLOR-CON) 20 MEQ tablet Take 2 tablets (40 mEq total) by mouth once. (Patient taking differently: Take 20 mEq by mouth daily. ) 14 tablet 0  . sotalol (BETAPACE) 80 MG tablet Take 1 tablet by mouth 2 (two) times daily.    Marland Kitchen spironolactone (ALDACTONE) 100 MG tablet Take 100 mg by mouth daily.    Marland Kitchen telmisartan (MICARDIS) 80 MG tablet Take 80 mg by mouth daily.     . clotrimazole (LOTRIMIN) 1 % cream Apply 1 application topically 2 (two) times daily.    . diphenhydrAMINE (BENADRYL) 25 mg capsule Take 1 capsule (25 mg total) by mouth once. Pt should take benadryl 25 mg capsule 1 capsule 1 hr prior to ct scan procedure (Patient not taking: Reported on 08/03/2017) 1 capsule 0  . fluticasone (FLONASE) 50 MCG/ACT nasal spray Place into the nose.    . losartan (COZAAR) 100 MG tablet Take 100 mg by mouth.    . promethazine (PHENERGAN) 25 MG tablet Take 25 mg by mouth every 6 (six) hours as needed for nausea or vomiting.    . rosuvastatin  (CRESTOR) 40 MG tablet Take 40 mg by mouth daily.      No current facility-administered medications for this visit.     PHYSICAL EXAMINATION: ECOG PERFORMANCE STATUS: 0 - Asymptomatic  BP 119/76 (BP Location: Left Arm, Patient Position: Sitting)   Pulse 82   Temp (!) 96.3 F (35.7 C) (Tympanic)   Resp 18   Wt 242 lb 6.4 oz (110 kg)   BMI 37.97 kg/m   Filed Weights   09/15/18 1425  Weight: 242 lb 6.4 oz (110 kg)    Physical Exam  Constitutional: She is oriented to person, place, and time and well-developed, well-nourished, and in no distress.  Patient is alone.  HENT:  Head: Normocephalic and atraumatic.  Mouth/Throat: Oropharynx is clear and moist. No oropharyngeal exudate.  Eyes: Pupils are equal, round, and reactive to light.  Neck: Normal range of motion. Neck supple.  Cardiovascular: Normal rate and regular rhythm.  Pulmonary/Chest: No  respiratory distress. She has no wheezes.  Abdominal: Soft. Bowel sounds are normal. She exhibits no distension and no mass. There is no tenderness. There is no rebound and no guarding.  Musculoskeletal: Normal range of motion. She exhibits no edema or tenderness.  Neurological: She is alert and oriented to person, place, and time.  Skin: Skin is warm.  Psychiatric: Affect normal.     LABORATORY DATA:  I have reviewed the data as listed    Component Value Date/Time   NA 141 05/19/2018 1122   NA 136 03/10/2015 1640   K 4.2 05/19/2018 1122   K 2.7 (L) 03/10/2015 1640   CL 108 05/19/2018 1122   CL 96 (L) 03/10/2015 1640   CO2 24 05/19/2018 1122   CO2 31 03/10/2015 1640   GLUCOSE 106 (H) 05/19/2018 1122   GLUCOSE 99 03/10/2015 1640   BUN 29 (H) 05/19/2018 1122   BUN 16 03/10/2015 1640   CREATININE 1.81 (H) 05/19/2018 1122   CREATININE 1.18 (H) 03/10/2015 1640   CALCIUM 9.0 05/19/2018 1122   CALCIUM 9.0 03/10/2015 1640   PROT 7.3 05/19/2018 1122   PROT 7.7 03/10/2015 1640   ALBUMIN 3.8 05/19/2018 1122   ALBUMIN 4.1  03/10/2015 1640   AST 31 05/19/2018 1122   AST 34 03/10/2015 1640   ALT 32 05/19/2018 1122   ALT 39 03/10/2015 1640   ALKPHOS 72 05/19/2018 1122   ALKPHOS 60 03/10/2015 1640   BILITOT 0.5 05/19/2018 1122   BILITOT 0.4 03/10/2015 1640   GFRNONAA 28 (L) 05/19/2018 1122   GFRNONAA 50 (L) 03/10/2015 1640   GFRAA 33 (L) 05/19/2018 1122   GFRAA 58 (L) 03/10/2015 1640    No results found for: SPEP, UPEP  Lab Results  Component Value Date   WBC 9.3 05/19/2018   NEUTROABS 5.7 05/19/2018   HGB 11.6 (L) 05/19/2018   HCT 34.7 (L) 05/19/2018   MCV 85.2 05/19/2018   PLT 295 05/19/2018      Chemistry      Component Value Date/Time   NA 141 05/19/2018 1122   NA 136 03/10/2015 1640   K 4.2 05/19/2018 1122   K 2.7 (L) 03/10/2015 1640   CL 108 05/19/2018 1122   CL 96 (L) 03/10/2015 1640   CO2 24 05/19/2018 1122   CO2 31 03/10/2015 1640   BUN 29 (H) 05/19/2018 1122   BUN 16 03/10/2015 1640   CREATININE 1.81 (H) 05/19/2018 1122   CREATININE 1.18 (H) 03/10/2015 1640      Component Value Date/Time   CALCIUM 9.0 05/19/2018 1122   CALCIUM 9.0 03/10/2015 1640   ALKPHOS 72 05/19/2018 1122   ALKPHOS 60 03/10/2015 1640   AST 31 05/19/2018 1122   AST 34 03/10/2015 1640   ALT 32 05/19/2018 1122   ALT 39 03/10/2015 1640   BILITOT 0.5 05/19/2018 1122   BILITOT 0.4 03/10/2015 1640      IMPRESSION: 1. No acute abnormality. 2. Stable anterior right lower quadrant peritoneal 1.6 cm soft tissue nodule, for which at least 2 year stability has been demonstrated, considered benign. 3.  Aortic Atherosclerosis (ICD10-I70.0).  Coronary atherosclerosis.   Electronically Signed   By: Ilona Sorrel M.D.   On: 08/01/2017 12:36  ASSESSMENT & PLAN:   Mesenteric mass # Anterior mesenteric nodule- 16x85m [sep 2016; smaller];  S/p Bx- leiomyoma. SEP 2018- 1.7 cm ant mesentric nodulel stable for 2 years; however patient complains of mild worsening of the right lower quadrant abdominal pain.  See  discussion  below  # Renal function- BL- 1.2; today- 1.8. No NSAIDs.[ Lasix q OD]; etiology of worsening renal function is unclear.  Recommend renal ultrasound.   # DM- better controlled.  # Thyroid nodules- followed by Dr.Solum.   #Addendum: Renal ultrasound-negative for any hydronephrosis; given the presence of worsening right quadrant abdominal pain-recommend CT of the abdomen pelvis noncontrast.  Follow-up to be decided upon results of the test.  Chromogranin A elevated 45.  CT scan is nondiagnostic; will review the tumor conference  Cc; [facility] clinic- [phone].    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  Do not suspect no active myeloma- [history of CKD stage III/mild anemia hemoglobin 11.5].  Do not recommend a bone marrow biopsy.   # MGUS-long discussion with the patient regarding natural history of MGUS; small risk of progression to multiple myeloma.  Recommend surveillance labs in 6 months.  # Anterior mesenteric nodule- 16x70m [sep 2016; smaller]; S/p Bx- leiomyoma; July 2019- STABLE. No further surveillaince scans.  Reviewed the tumor conference.  Prior octreotide scan negative.  Chromogranin A nonspecific.  #CKD stage III- no NSAIDs.[ Lasix q OD];  #Diabetes-hemoglobin A1c most recently 8.6 not well controlled.  Defer to PCP  # Thyroid nodules- followed by Dr.Solum.   Cc; Dr.balch  DISPOSITION:  # follow up in 8 months- labs- cbc/cmp/MM; K-L light chains 1 week prior-Dr.B     GCammie Sickle MD 09/17/2018 4:23 PM

## 2018-09-15 NOTE — Assessment & Plan Note (Signed)
#   Anterior mesenteric nodule- 16x67mm [sep 2016; smaller];  S/p Bx- leiomyoma. SEP 2018- 1.7 cm ant mesentric nodulel stable for 2 years; however patient complains of mild worsening of the right lower quadrant abdominal pain.  See discussion below  # Renal function- BL- 1.2; today- 1.8. No NSAIDs.[ Lasix q OD]; etiology of worsening renal function is unclear.  Recommend renal ultrasound.   # DM- better controlled.  # Thyroid nodules- followed by Dr.Solum.   #Addendum: Renal ultrasound-negative for any hydronephrosis; given the presence of worsening right quadrant abdominal pain-recommend CT of the abdomen pelvis noncontrast.  Follow-up to be decided upon results of the test.  Chromogranin A elevated 45.  CT scan is nondiagnostic; will review the tumor conference  Cc; [facility] clinic- [phone].

## 2018-09-15 NOTE — Assessment & Plan Note (Addendum)
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  Do not suspect no active myeloma- [history of CKD stage III/mild anemia hemoglobin 11.5].  Do not recommend a bone marrow biopsy.   # MGUS-long discussion with the patient regarding natural history of MGUS; small risk of progression to multiple myeloma.  Recommend surveillance labs in 6 months.  # Anterior mesenteric nodule- 16x1m [sep 2016; smaller]; S/p Bx- leiomyoma; July 2019- STABLE. No further surveillaince scans.  Reviewed the tumor conference.  Prior octreotide scan negative.  Chromogranin A nonspecific.  #CKD stage III- no NSAIDs.[ Lasix q OD];  #Diabetes-hemoglobin A1c most recently 8.6 not well controlled.  Defer to PCP  # Thyroid nodules- followed by Dr.Solum.   Cc; Dr.balch  DISPOSITION:  # follow up in 8 months- labs- cbc/cmp/MM; K-L light chains 1 week prior-Dr.B

## 2018-09-15 NOTE — Progress Notes (Signed)
Here for follow up. Stated that ESR elevated currently per pt   Stated she feels " little knots on R rib cib cage  " here to see DR B today to evaluate lab work .

## 2018-09-18 ENCOUNTER — Encounter (INDEPENDENT_AMBULATORY_CARE_PROVIDER_SITE_OTHER): Payer: Self-pay | Admitting: Nurse Practitioner

## 2018-09-18 ENCOUNTER — Ambulatory Visit (INDEPENDENT_AMBULATORY_CARE_PROVIDER_SITE_OTHER): Payer: Medicare HMO

## 2018-09-18 ENCOUNTER — Ambulatory Visit (INDEPENDENT_AMBULATORY_CARE_PROVIDER_SITE_OTHER): Payer: Medicare HMO | Admitting: Nurse Practitioner

## 2018-09-18 VITALS — BP 123/82 | HR 81 | Resp 18 | Ht 67.0 in | Wt 242.0 lb

## 2018-09-18 DIAGNOSIS — R1031 Right lower quadrant pain: Secondary | ICD-10-CM | POA: Diagnosis not present

## 2018-09-18 DIAGNOSIS — E785 Hyperlipidemia, unspecified: Secondary | ICD-10-CM

## 2018-09-18 DIAGNOSIS — R109 Unspecified abdominal pain: Secondary | ICD-10-CM

## 2018-09-18 DIAGNOSIS — E1142 Type 2 diabetes mellitus with diabetic polyneuropathy: Secondary | ICD-10-CM | POA: Diagnosis not present

## 2018-09-18 DIAGNOSIS — R252 Cramp and spasm: Secondary | ICD-10-CM

## 2018-09-18 DIAGNOSIS — G8929 Other chronic pain: Secondary | ICD-10-CM

## 2018-09-18 NOTE — Progress Notes (Signed)
Subjective:    Patient ID: Hailey Howard, female    DOB: 1956/07/16, 62 y.o.   MRN: [id number] Chief Complaint  Patient presents with  . New Patient (Initial Visit)    Side pain after eating, CAD    HPI  Hailey Howard is a 62 y.o. female that presents today as a referral from [place], NP in gastroenterology for concern of mesenteric ischemia.  Hailey Howard states that she has had ongoing right lower quadrant pain nagging for months.  She states that this pain is constant and it is not made worse or better with food.  She endorses constant nausea.  There is no change in pain with defecation.  She endorses having a decreased appetite.  She denies any extensive weight loss, perhaps 1 to 2 pounds.  She denies any vomiting or food phobia.  She endorses having some lower extremity cramps during the evening and in some instances with walking.  She denies having any chest pain or shortness of breath.  She underwent an abdominal visceral duplex today which found a normal celiac artery, superior mesenteric artery, splenic artery and hepatic artery.  There is no evidence of significant abdominal aortic stenosis.  There are no atypical structures of vascularity visualized in the right lower quadrant region.  Past Medical History:  Diagnosis Date  . Anemia   . Arthritis   . BBB (bundle branch block)   . Bradycardia   . Coronary artery disease   . Diabetes mellitus without complication ([place])   . DM type 2 with diabetic peripheral neuropathy ([place]) 09/19/2015  . Dysphagia   . Dysrhythmia   . GERD (gastroesophageal reflux disease)   . Headache   . History of hiatal hernia   . Hypertension   . Mesenteric mass   . Rotator cuff tendinitis, left   . Unspecified atrial fibrillation [facility])     Past Surgical History:  Procedure Laterality Date  . ABDOMINAL HYSTERECTOMY    . CHOLECYSTECTOMY    . COLONOSCOPY WITH PROPOFOL N/A 12/20/2016   Procedure: COLONOSCOPY WITH PROPOFOL;  Surgeon: Manya Silvas, MD;  Location: [facility] ENDOSCOPY;  Service: Endoscopy;  Laterality: N/A;  . CORONARY ARTERY BYPASS GRAFT    . ESOPHAGOGASTRODUODENOSCOPY (EGD) WITH PROPOFOL N/A 04/27/2017   Procedure: ESOPHAGOGASTRODUODENOSCOPY (EGD) WITH PROPOFOL;  Surgeon: Manya Silvas, MD;  Location: [facility] ENDOSCOPY;  Service: Endoscopy;  Laterality: N/A;  . EUS N/A 04/24/2015   Procedure: ESOPHAGEAL ENDOSCOPIC ULTRASOUND (EUS) RADIAL;  Surgeon: Cora Daniels, MD;  Location: [facility] ENDOSCOPY;  Service: Endoscopy;  Laterality: N/A;  . THYROIDECTOMY, PARTIAL      Social History   Socioeconomic History  . Marital status: Married    Spouse name: Not on file  . Number of children: Not on file  . Years of education: Not on file  . Highest education level: Not on file  Occupational History  . Not on file  Social Needs  . Financial resource strain: Not on file  . Food insecurity:    Worry: Not on file    Inability: Not on file  . Transportation needs:    Medical: Not on file    Non-medical: Not on file  Tobacco Use  . Smoking status: Never Smoker  . Smokeless tobacco: Never Used  Substance and Sexual Activity  . Alcohol use: No    Alcohol/week: 0.0 standard drinks  . Drug use: No  . Sexual activity: Not on file  Lifestyle  . Physical activity:  Days per week: Not on file    Minutes per session: Not on file  . Stress: Not on file  Relationships  . Social connections:    Talks on phone: Not on file    Gets together: Not on file    Attends religious service: Not on file    Active member of club or organization: Not on file    Attends meetings of clubs or organizations: Not on file    Relationship status: Not on file  . Intimate partner violence:    Fear of current or ex partner: Not on file    Emotionally abused: Not on file    Physically abused: Not on file    Forced sexual activity: Not on file  Other Topics Concern  . Not on file  Social History Narrative  . Not on file    Family  History  Problem Relation Age of Onset  . Breast cancer Neg Hx     Allergies  Allergen Reactions  . Other Hives, Itching and Anaphylaxis    Iv dye  Confirmed with pt over the phone on 08/01/15 Iv dye   . Etodolac Itching  . Iodinated Diagnostic Agents Other (See Comments)    Hives and itching  . Oxycodone-Acetaminophen Hives     Review of Systems   Review of Systems: Negative Unless Checked Constitutional: [] Weight loss  [] Fever  [] Chills Cardiac: [] Chest pain   []  Atrial Fibrillation  [] Palpitations   [] Shortness of breath when laying flat   [] Shortness of breath with exertion. Vascular:  [x] Pain in legs with walking   [] Pain in legs with standing  [] History of DVT   [] Phlebitis   [] Swelling in legs   [] Varicose veins   [] Non-healing ulcers Pulmonary:   [] Uses home oxygen   [] Productive cough   [] Hemoptysis   [] Wheeze  [] COPD   [] Asthma Neurologic:  [] Dizziness   [] Seizures   [] History of stroke   [] History of TIA  [] Aphasia   [] Vissual changes   [] Weakness or numbness in arm   [] Weakness or numbness in leg Musculoskeletal:   [] Joint swelling   [] Joint pain   [] Low back pain  []  History of Knee Replacement Hematologic:  [] Easy bruising  [] Easy bleeding   [] Hypercoagulable state   [] Anemic Gastrointestinal:  [] Diarrhea   [] Vomiting  [] Gastroesophageal reflux/heartburn   [] Difficulty swallowing. Genitourinary:  [] Chronic kidney disease   [] Difficult urination  [] Anuric   [] Blood in urine Skin:  [] Rashes   [] Ulcers  Psychological:  [] History of anxiety   []  History of major depression  []  Memory Difficulties     Objective:   Physical Exam  BP 123/82 (BP Location: Right Arm, Patient Position: Sitting)   Pulse 81   Resp 18   Ht 5\' 7"  (1.702 m)   Wt 242 lb (109.8 kg)   BMI 37.90 kg/m   Gen: WD/WN, NAD Head: [place]/AT, No temporalis wasting.  Ear/Nose/Throat: Hearing grossly intact, nares w/o erythema or drainage Eyes: PER, EOMI, sclera nonicteric.  Neck: Supple, no masses.  No  JVD.  Pulmonary:  Good air movement, no use of accessory muscles.  Cardiac: RRR Vascular:  Vessel Right Left  Radial Palpable Palpable  Dorsalis Pedis  non-palpable nonPalpable  Posterior Tibial  non palpable nonPalpable   Gastrointestinal: soft, non-distended. No guarding/no peritoneal signs.  Musculoskeletal: M/S 5/5 throughout.  No deformity or atrophy.  Neurologic: Pain and light touch intact in extremities.  Symmetrical.  Speech is fluent. Motor exam as listed above. Psychiatric: Judgment intact, Mood &  affect appropriate for pt's clinical situation. Dermatologic: No Venous rashes. No Ulcers Noted.  No changes consistent with cellulitis. Lymph : No Cervical lymphadenopathy, no lichenification or skin changes of chronic lymphedema.      Assessment & Plan:   1. Abdominal pain, chronic, right lower quadrant She underwent an abdominal visceral duplex today which found a normal celiac artery, superior mesenteric artery, splenic artery and hepatic artery.  There is no evidence of significant abdominal aortic stenosis.  There are no atypical structures of vascularity visualized in the right lower quadrant region.  Recommend:  The patient has no evidence of chronic asymptomatic mesenteric atherosclerosis.  The patient denies lifestyle limiting changes at this point in time. No intervention is warranted at this time.   No further invasive studies, angiography or surgery at this time  The patient should follow-up with gastroenterology in regards to further steps for diagnosis of abdominal pain.  2. Bilateral leg cramps The patient complains of some pain with ambulation, but mostly charley horse type pain.  However, given the patient's risk for peripheral vascular disease as well as nonpalpable pulses I feel it is prudent to follow-up with bilateral ABIs.  Patient will follow-up with me in the office following studies.   - VAS [place] ABI WITH/WO TBI; Future  3. DM type 2 with diabetic  peripheral neuropathy ([place]) Continue hypoglycemic medications as already ordered, these medications have been reviewed and there are no changes at this time.  Hgb A1C to be monitored as already arranged by primary service   4. Hyperlipidemia, unspecified hyperlipidemia type Continue statin as ordered and reviewed, no changes at this time    Current Outpatient Medications on File Prior to Visit  Medication Sig Dispense Refill  . amLODipine (NORVASC) 5 MG tablet Take 2.5 mg by mouth.    Marland Kitchen aspirin EC 81 MG tablet Take 1 tablet by mouth daily.    . Calcium Citrate-Vitamin D (CALCIUM CITRATE + D3 PO) Take 660 mg by mouth daily.     . cholecalciferol (VITAMIN D) 400 units TABS tablet Take 400 Units by mouth.    . clotrimazole (LOTRIMIN) 1 % cream Apply 1 application topically 2 (two) times daily.    . ferrous sulfate 325 (65 FE) MG tablet Take 325 mg by mouth daily with breakfast.    . fluticasone (FLONASE) 50 MCG/ACT nasal spray Place into the nose.    . furosemide (LASIX) 40 MG tablet Take 1 tablet by mouth daily.    Marland Kitchen gabapentin (NEURONTIN) 300 MG capsule Take 1 capsule by mouth daily as needed (nerve pain).     . insulin aspart (NOVOLOG FLEXPEN) 100 UNIT/ML FlexPen Take by mouth.     . Insulin Glargine (LANTUS) 100 UNIT/ML Solostar Pen Inject 38 Units into the skin at bedtime.     . lansoprazole (PREVACID) 30 MG capsule Take 30 mg by mouth daily before breakfast.    . metFORMIN (GLUCOPHAGE-XR) 500 MG 24 hr tablet Take 500 mg by mouth daily.     . potassium chloride SA (K-DUR,KLOR-CON) 20 MEQ tablet Take 2 tablets (40 mEq total) by mouth once. (Patient taking differently: Take 20 mEq by mouth daily. ) 14 tablet 0  . promethazine (PHENERGAN) 25 MG tablet Take 25 mg by mouth every 6 (six) hours as needed for nausea or vomiting.    . rosuvastatin (CRESTOR) 40 MG tablet Take 40 mg by mouth daily.     Marland Kitchen spironolactone (ALDACTONE) 100 MG tablet Take 100 mg by mouth daily.    Marland Kitchen  telmisartan  (MICARDIS) 80 MG tablet Take 80 mg by mouth daily.      No current facility-administered medications on file prior to visit.     There are no Patient Instructions on file for this visit. No follow-ups on file.   Kris Hartmann, NP  This note was completed with Sales executive.  Any errors are purely unintentional.

## 2018-09-19 ENCOUNTER — Emergency Department
Admission: EM | Admit: 2018-09-19 | Discharge: 2018-09-19 | Disposition: A | Discharge status: Home / Self Care | Payer: Medicare HMO | Attending: Emergency Medicine | Admitting: Emergency Medicine

## 2018-09-19 ENCOUNTER — Encounter: Payer: Self-pay | Admitting: Emergency Medicine

## 2018-09-19 ENCOUNTER — Other Ambulatory Visit: Payer: Self-pay

## 2018-09-19 DIAGNOSIS — E1140 Type 2 diabetes mellitus with diabetic neuropathy, unspecified: Secondary | ICD-10-CM | POA: Insufficient documentation

## 2018-09-19 DIAGNOSIS — I10 Essential (primary) hypertension: Secondary | ICD-10-CM | POA: Insufficient documentation

## 2018-09-19 DIAGNOSIS — R109 Unspecified abdominal pain: Secondary | ICD-10-CM

## 2018-09-19 DIAGNOSIS — R1031 Right lower quadrant pain: Secondary | ICD-10-CM | POA: Diagnosis not present

## 2018-09-19 DIAGNOSIS — G8929 Other chronic pain: Secondary | ICD-10-CM | POA: Diagnosis not present

## 2018-09-19 DIAGNOSIS — I2510 Atherosclerotic heart disease of native coronary artery without angina pectoris: Secondary | ICD-10-CM | POA: Insufficient documentation

## 2018-09-19 LAB — COMPREHENSIVE METABOLIC PANEL
ALT: 18 U/L (ref 0–44)
AST: 20 U/L (ref 15–41)
Albumin: 4 g/dL (ref 3.5–5.0)
Alkaline Phosphatase: 61 U/L (ref 38–126)
Anion gap: 9 (ref 5–15)
BUN: 33 mg/dL — ABNORMAL HIGH (ref 8–23)
CHLORIDE: 106 mmol/L (ref 98–111)
CO2: 22 mmol/L (ref 22–32)
CREATININE: 1.53 mg/dL — AB (ref 0.44–1.00)
Calcium: 9.1 mg/dL (ref 8.9–10.3)
GFR calc Af Amer: 40 mL/min — ABNORMAL LOW (ref >60)
GFR, EST NON AFRICAN AMERICAN: 35 mL/min — AB (ref >60)
Glucose, Bld: 96 mg/dL (ref 70–99)
Potassium: 4.8 mmol/L (ref 3.5–5.1)
Sodium: 137 mmol/L (ref 135–145)
Total Bilirubin: 0.6 mg/dL (ref 0.3–1.2)
Total Protein: 7.1 g/dL (ref 6.5–8.1)

## 2018-09-19 LAB — URINALYSIS, COMPLETE (UACMP) WITH MICROSCOPIC
BACTERIA UA: NONE SEEN
Bilirubin Urine: NEGATIVE
Glucose, UA: NEGATIVE mg/dL
Hgb urine dipstick: NEGATIVE
Ketones, ur: NEGATIVE mg/dL
Leukocytes, UA: NEGATIVE
Nitrite: NEGATIVE
PH: 5 (ref 5.0–8.0)
Protein, ur: NEGATIVE mg/dL
SPECIFIC GRAVITY, URINE: 1.006 (ref 1.005–1.030)

## 2018-09-19 LAB — LIPASE, BLOOD: LIPASE: 37 U/L (ref 11–51)

## 2018-09-19 LAB — CBC
HEMATOCRIT: 33.6 % — AB (ref 36.0–46.0)
Hemoglobin: 10.7 g/dL — ABNORMAL LOW (ref 12.0–15.0)
MCH: 28.1 pg (ref 26.0–34.0)
MCHC: 31.8 g/dL (ref 30.0–36.0)
MCV: 88.2 fL (ref 80.0–100.0)
Platelets: 275 10*3/uL (ref 150–400)
RBC: 3.81 MIL/uL — ABNORMAL LOW (ref 3.87–5.11)
RDW: 15.6 % — AB (ref 11.5–15.5)
WBC: 7.5 10*3/uL (ref 4.0–10.5)
nRBC: 0 % (ref 0.0–0.2)

## 2018-09-19 MED ORDER — HYDROCODONE-ACETAMINOPHEN 5-325 MG PO TABS
2.0000 | ORAL_TABLET | Freq: Once | ORAL | Status: AC
  Administered 2018-09-19: 2 via ORAL
  Filled 2018-09-19: qty 2

## 2018-09-19 MED ORDER — HYDROCODONE-ACETAMINOPHEN 5-325 MG PO TABS: 1.0000 | ORAL_TABLET | Freq: Two times a day (BID) | ORAL | 0 refills | Status: DC | PRN

## 2018-09-19 NOTE — ED Triage Notes (Signed)
Says right lower quad abd pain for weeks.  Has been to doctor, but they have not found anything. Has had testing done and beent o GI doctor

## 2018-09-19 NOTE — ED Notes (Signed)
FIRST NURSE NOTE: pt comes into the ED via EMS from home, states pt c/o cold like sx for the past several days, had a syncopal episode today, VSS. Pt also c/o nausea in route, IV #18 LFA, zofran 4mg  given by EMS

## 2018-09-19 NOTE — ED Notes (Signed)
Pt alert and oriented X4, active, cooperative, pt in NAD. RR even and unlabored, color WNL.  Pt informed to return if any life threatening symptoms occur.  Discharge and followup instructions reviewed. Ambulates safely. Husband driving home.

## 2018-09-19 NOTE — ED Provider Notes (Signed)
[facility] Emergency Department Provider Note       Time seen: ----------------------------------------- 2:38 PM on 09/19/2018 -----------------------------------------   I have reviewed the triage vital signs and the nursing notes.  HISTORY   Chief Complaint Abdominal Pain    HPI Hailey Howard is a 62 y.o. female with a history of anemia, arthritis, bundle branch block, bradycardia, diabetes, dysphagia, headache, hypertension who presents to the ED for right lower quadrant pain for weeks.  Patient has had pain since March in the right lower quadrant.  She has seen her doctor as well as gastroenterology and vascular surgery without a specific diagnosis.  Past Medical History:  Diagnosis Date  . Anemia   . Arthritis   . BBB (bundle branch block)   . Bradycardia   . Coronary artery disease   . Diabetes mellitus without complication ([place])   . DM type 2 with diabetic peripheral neuropathy ([place]) 09/19/2015  . Dysphagia   . Dysrhythmia   . GERD (gastroesophageal reflux disease)   . Headache   . History of hiatal hernia   . Hypertension   . Mesenteric mass   . Rotator cuff tendinitis, left   . Unspecified atrial fibrillation [facility])     Patient Active Problem List   Diagnosis Date Noted  . MGUS (monoclonal gammopathy of unknown significance) 09/15/2018  . Coronary artery disease 08/02/2016  . Hyperlipidemia 08/02/2016  . Hypertension 08/02/2016  . Tachycardia 08/02/2016  . Mesenteric mass 08/02/2016  . Bursitis of left shoulder 05/07/2016  . DM type 2 with diabetic peripheral neuropathy ([place]) 09/19/2015  . Edema 06/16/2015  . Atrial fibrillation ([place]) 05/01/2014    Past Surgical History:  Procedure Laterality Date  . ABDOMINAL HYSTERECTOMY    . CHOLECYSTECTOMY    . COLONOSCOPY WITH PROPOFOL N/A 12/20/2016   Procedure: COLONOSCOPY WITH PROPOFOL;  Surgeon: Manya Silvas, MD;  Location: [facility] ENDOSCOPY;  Service: Endoscopy;  Laterality:  N/A;  . CORONARY ARTERY BYPASS GRAFT    . ESOPHAGOGASTRODUODENOSCOPY (EGD) WITH PROPOFOL N/A 04/27/2017   Procedure: ESOPHAGOGASTRODUODENOSCOPY (EGD) WITH PROPOFOL;  Surgeon: Manya Silvas, MD;  Location: [facility] ENDOSCOPY;  Service: Endoscopy;  Laterality: N/A;  . EUS N/A 04/24/2015   Procedure: ESOPHAGEAL ENDOSCOPIC ULTRASOUND (EUS) RADIAL;  Surgeon: Cora Daniels, MD;  Location: [facility] ENDOSCOPY;  Service: Endoscopy;  Laterality: N/A;  . THYROIDECTOMY, PARTIAL      Allergies Other; Etodolac; Iodinated diagnostic agents; and Oxycodone-acetaminophen  Social History Social History   Tobacco Use  . Smoking status: Never Smoker  . Smokeless tobacco: Never Used  Substance Use Topics  . Alcohol use: No    Alcohol/week: 0.0 standard drinks  . Drug use: No   Review of Systems Constitutional: Negative for fever. Cardiovascular: Negative for chest pain. Respiratory: Negative for shortness of breath. Gastrointestinal: Positive for abdominal pain Genitourinary: Negative for dysuria. Musculoskeletal: Negative for back pain. Skin: Negative for rash. Neurological: Negative for headaches, focal weakness or numbness.  All systems negative/normal/unremarkable except as stated in the HPI  ____________________________________________   PHYSICAL EXAM:  VITAL SIGNS: ED Triage Vitals  Enc Vitals Group     BP 09/19/18 1338 132/86     Pulse Rate 09/19/18 1338 77     Resp 09/19/18 1338 16     Temp 09/19/18 1338 98 F (36.7 C)     Temp Source 09/19/18 1338 Oral     SpO2 09/19/18 1338 100 %     Weight 09/19/18 1336 242 lb 1 oz (109.8 kg)  Height 09/19/18 1336 5\' 7"  (1.702 m)     Head Circumference --      Peak Flow --      Pain Score 09/19/18 1338 10     Pain Loc --      Pain Edu? --      Excl. in [place]? --    Constitutional: Alert and oriented. Well appearing and in no distress. Eyes: Conjunctivae are normal. Normal extraocular movements. ENT   Head: Normocephalic and  atraumatic.   Nose: No congestion/rhinnorhea.   Mouth/Throat: Mucous membranes are moist.   Neck: No stridor. Cardiovascular: Normal rate, regular rhythm. No murmurs, rubs, or gallops. Respiratory: Normal respiratory effort without tachypnea nor retractions. Breath sounds are clear and equal bilaterally. No wheezes/rales/rhonchi. Gastrointestinal: Soft and nontender. Normal bowel sounds Musculoskeletal: Nontender with normal range of motion in extremities. No lower extremity tenderness nor edema. Neurologic:  Normal speech and language. No gross focal neurologic deficits are appreciated.  Skin:  Skin is warm, dry and intact. No rash noted. Psychiatric: Mood and affect are normal. Speech and behavior are normal.  ____________________________________________  ED COURSE:  As part of my medical decision making, I reviewed the following data within the [place] History obtained from family if available, nursing notes, old chart and ekg, as well as notes from prior ED visits. Patient presented for abdominal pain, we will assess with labs and imaging as indicated at this time.   Procedures ____________________________________________   LABS (pertinent positives/negatives)  Labs Reviewed  COMPREHENSIVE METABOLIC PANEL - Abnormal; Notable for the following components:      Result Value   BUN 33 (*)    Creatinine, Ser 1.53 (*)    GFR calc non Af Amer 35 (*)    GFR calc Af Amer 40 (*)    All other components within normal limits  CBC - Abnormal; Notable for the following components:   RBC 3.81 (*)    Hemoglobin 10.7 (*)    HCT 33.6 (*)    RDW 15.6 (*)    All other components within normal limits  URINALYSIS, COMPLETE (UACMP) WITH MICROSCOPIC - Abnormal; Notable for the following components:   Color, Urine COLORLESS (*)    APPearance CLEAR (*)    All other components within normal limits  LIPASE, BLOOD    ____________________________________________  DIFFERENTIAL DIAGNOSIS   Chronic pain, scar tissue formation, appendicitis, renal colic, UTI, pyelonephritis  FINAL ASSESSMENT AND PLAN  Chronic pain   Plan: The patient had presented for chronic abdominal pain. Patient's labs are unremarkable.  I do not see any further need for imaging at this time.  She has had numerous testing done in the last 7 months.  I will prescribe a short supply pain medicine for her, she has outpatient MRI ordered for further imaging.   Laurence Aly, MD   Note: This note was generated in part or whole with voice recognition software. Voice recognition is usually quite accurate but there are transcription errors that can and very often do occur. I apologize for any typographical errors that were not detected and corrected.     Earleen Newport, MD 09/19/18 [phone]

## 2018-10-12 ENCOUNTER — Ambulatory Visit
Admission: RE | Admit: 2018-10-12 | Discharge: 2018-10-12 | Disposition: A | Discharge status: Home / Self Care | Payer: Medicare HMO | Source: Ambulatory Visit | Attending: Physical Medicine and Rehabilitation | Admitting: Physical Medicine and Rehabilitation

## 2018-10-12 DIAGNOSIS — M5136 Other intervertebral disc degeneration, lumbar region: Secondary | ICD-10-CM | POA: Insufficient documentation

## 2018-10-12 DIAGNOSIS — M7138 Other bursal cyst, other site: Secondary | ICD-10-CM | POA: Insufficient documentation

## 2018-10-12 DIAGNOSIS — M48061 Spinal stenosis, lumbar region without neurogenic claudication: Secondary | ICD-10-CM | POA: Diagnosis not present

## 2018-10-12 DIAGNOSIS — M5442 Lumbago with sciatica, left side: Secondary | ICD-10-CM | POA: Diagnosis present

## 2018-10-12 LAB — POCT I-STAT CREATININE: Creatinine, Ser: 1.1 mg/dL — ABNORMAL HIGH (ref 0.44–1.00)

## 2018-11-01 ENCOUNTER — Other Ambulatory Visit: Payer: Self-pay | Admitting: Family Medicine

## 2018-11-01 DIAGNOSIS — Z1231 Encounter for screening mammogram for malignant neoplasm of breast: Secondary | ICD-10-CM

## 2018-12-18 ENCOUNTER — Ambulatory Visit
Admission: RE | Admit: 2018-12-18 | Discharge: 2018-12-18 | Disposition: A | Discharge status: Home / Self Care | Payer: Medicare HMO | Source: Ambulatory Visit | Attending: Family Medicine | Admitting: Family Medicine

## 2018-12-18 DIAGNOSIS — Z1231 Encounter for screening mammogram for malignant neoplasm of breast: Secondary | ICD-10-CM | POA: Diagnosis not present

## 2019-02-06 ENCOUNTER — Ambulatory Visit: Payer: Medicare HMO

## 2019-02-06 ENCOUNTER — Other Ambulatory Visit: Payer: Self-pay | Admitting: Family Medicine

## 2019-02-06 DIAGNOSIS — M7989 Other specified soft tissue disorders: Secondary | ICD-10-CM

## 2019-03-06 ENCOUNTER — Ambulatory Visit
Admission: RE | Admit: 2019-03-06 | Discharge: 2019-03-06 | Disposition: A | Discharge status: Home / Self Care | Payer: Medicare HMO | Source: Ambulatory Visit | Attending: Family Medicine | Admitting: Family Medicine

## 2019-03-06 ENCOUNTER — Ambulatory Visit
Admission: RE | Admit: 2019-03-06 | Discharge: 2019-03-06 | Disposition: A | Discharge status: Home / Self Care | Payer: Medicare HMO | Attending: Family Medicine | Admitting: Family Medicine

## 2019-03-06 ENCOUNTER — Other Ambulatory Visit: Payer: Self-pay

## 2019-03-06 DIAGNOSIS — M7989 Other specified soft tissue disorders: Secondary | ICD-10-CM | POA: Insufficient documentation

## 2019-05-10 ENCOUNTER — Other Ambulatory Visit: Payer: Self-pay

## 2019-05-10 ENCOUNTER — Other Ambulatory Visit: Payer: Self-pay | Admitting: *Deleted

## 2019-05-10 DIAGNOSIS — D472 Monoclonal gammopathy: Secondary | ICD-10-CM

## 2019-05-11 ENCOUNTER — Inpatient Hospital Stay: Payer: Medicare HMO | Attending: Internal Medicine

## 2019-05-11 ENCOUNTER — Other Ambulatory Visit: Payer: Self-pay

## 2019-05-11 DIAGNOSIS — Z7982 Long term (current) use of aspirin: Secondary | ICD-10-CM | POA: Insufficient documentation

## 2019-05-11 DIAGNOSIS — E119 Type 2 diabetes mellitus without complications: Secondary | ICD-10-CM | POA: Insufficient documentation

## 2019-05-11 DIAGNOSIS — D472 Monoclonal gammopathy: Secondary | ICD-10-CM | POA: Insufficient documentation

## 2019-05-11 DIAGNOSIS — Z7901 Long term (current) use of anticoagulants: Secondary | ICD-10-CM | POA: Diagnosis not present

## 2019-05-11 DIAGNOSIS — Z7951 Long term (current) use of inhaled steroids: Secondary | ICD-10-CM | POA: Diagnosis not present

## 2019-05-11 DIAGNOSIS — Z794 Long term (current) use of insulin: Secondary | ICD-10-CM | POA: Insufficient documentation

## 2019-05-11 DIAGNOSIS — N183 Chronic kidney disease, stage 3 (moderate): Secondary | ICD-10-CM | POA: Diagnosis not present

## 2019-05-11 DIAGNOSIS — Z803 Family history of malignant neoplasm of breast: Secondary | ICD-10-CM | POA: Insufficient documentation

## 2019-05-11 DIAGNOSIS — I10 Essential (primary) hypertension: Secondary | ICD-10-CM | POA: Insufficient documentation

## 2019-05-11 DIAGNOSIS — E042 Nontoxic multinodular goiter: Secondary | ICD-10-CM | POA: Insufficient documentation

## 2019-05-11 DIAGNOSIS — Z79899 Other long term (current) drug therapy: Secondary | ICD-10-CM | POA: Insufficient documentation

## 2019-05-11 DIAGNOSIS — D509 Iron deficiency anemia, unspecified: Secondary | ICD-10-CM | POA: Diagnosis not present

## 2019-05-11 LAB — CBC WITH DIFFERENTIAL/PLATELET
Abs Immature Granulocytes: 0.02 10*3/uL (ref 0.00–0.07)
Basophils Absolute: 0 10*3/uL (ref 0.0–0.1)
Basophils Relative: 0 %
Eosinophils Absolute: 0.2 10*3/uL (ref 0.0–0.5)
Eosinophils Relative: 3 %
HCT: 31.2 % — ABNORMAL LOW (ref 36.0–46.0)
Hemoglobin: 10 g/dL — ABNORMAL LOW (ref 12.0–15.0)
Immature Granulocytes: 0 %
Lymphocytes Relative: 27 %
Lymphs Abs: 1.9 10*3/uL (ref 0.7–4.0)
MCH: 27.9 pg (ref 26.0–34.0)
MCHC: 32.1 g/dL (ref 30.0–36.0)
MCV: 87.2 fL (ref 80.0–100.0)
Monocytes Absolute: 0.4 10*3/uL (ref 0.1–1.0)
Monocytes Relative: 6 %
Neutro Abs: 4.5 10*3/uL (ref 1.7–7.7)
Neutrophils Relative %: 64 %
Platelets: 245 10*3/uL (ref 150–400)
RBC: 3.58 MIL/uL — ABNORMAL LOW (ref 3.87–5.11)
RDW: 13.7 % (ref 11.5–15.5)
WBC: 7 10*3/uL (ref 4.0–10.5)
nRBC: 0 % (ref 0.0–0.2)

## 2019-05-11 LAB — COMPREHENSIVE METABOLIC PANEL
ALT: 14 U/L (ref 0–44)
AST: 16 U/L (ref 15–41)
Albumin: 4 g/dL (ref 3.5–5.0)
Alkaline Phosphatase: 80 U/L (ref 38–126)
Anion gap: 9 (ref 5–15)
BUN: 40 mg/dL — ABNORMAL HIGH (ref 8–23)
CO2: 21 mmol/L — ABNORMAL LOW (ref 22–32)
Calcium: 9.2 mg/dL (ref 8.9–10.3)
Chloride: 107 mmol/L (ref 98–111)
Creatinine, Ser: 1.45 mg/dL — ABNORMAL HIGH (ref 0.44–1.00)
GFR calc Af Amer: 44 mL/min — ABNORMAL LOW (ref >60)
GFR calc non Af Amer: 38 mL/min — ABNORMAL LOW (ref >60)
Glucose, Bld: 139 mg/dL — ABNORMAL HIGH (ref 70–99)
Potassium: 4.6 mmol/L (ref 3.5–5.1)
Sodium: 137 mmol/L (ref 135–145)
Total Bilirubin: 0.4 mg/dL (ref 0.3–1.2)
Total Protein: 7.6 g/dL (ref 6.5–8.1)

## 2019-05-14 LAB — MULTIPLE MYELOMA PANEL, SERUM
Albumin SerPl Elph-Mcnc: 3.6 g/dL (ref 2.9–4.4)
Albumin/Glob SerPl: 1.2 (ref 0.7–1.7)
Alpha 1: 0.2 g/dL (ref 0.0–0.4)
Alpha2 Glob SerPl Elph-Mcnc: 0.9 g/dL (ref 0.4–1.0)
B-Globulin SerPl Elph-Mcnc: 1.2 g/dL (ref 0.7–1.3)
Gamma Glob SerPl Elph-Mcnc: 0.9 g/dL (ref 0.4–1.8)
Globulin, Total: 3.2 g/dL (ref 2.2–3.9)
IgA: 282 mg/dL (ref 87–352)
IgG (Immunoglobin G), Serum: 941 mg/dL (ref 586–1602)
IgM (Immunoglobulin M), Srm: 128 mg/dL (ref 26–217)
Total Protein ELP: 6.8 g/dL (ref 6.0–8.5)

## 2019-05-14 LAB — KAPPA/LAMBDA LIGHT CHAINS
Kappa free light chain: 20.3 mg/L — ABNORMAL HIGH (ref 3.3–19.4)
Kappa, lambda light chain ratio: 1.47 (ref 0.26–1.65)
Lambda free light chains: 13.8 mg/L (ref 5.7–26.3)

## 2019-05-18 ENCOUNTER — Inpatient Hospital Stay: Payer: Medicare HMO | Admitting: Internal Medicine

## 2019-05-18 ENCOUNTER — Other Ambulatory Visit: Payer: Self-pay

## 2019-05-18 DIAGNOSIS — N183 Chronic kidney disease, stage 3 (moderate): Secondary | ICD-10-CM | POA: Diagnosis not present

## 2019-05-18 DIAGNOSIS — Z7951 Long term (current) use of inhaled steroids: Secondary | ICD-10-CM

## 2019-05-18 DIAGNOSIS — D472 Monoclonal gammopathy: Secondary | ICD-10-CM

## 2019-05-18 DIAGNOSIS — I10 Essential (primary) hypertension: Secondary | ICD-10-CM

## 2019-05-18 DIAGNOSIS — D509 Iron deficiency anemia, unspecified: Secondary | ICD-10-CM

## 2019-05-18 DIAGNOSIS — E042 Nontoxic multinodular goiter: Secondary | ICD-10-CM

## 2019-05-18 DIAGNOSIS — Z79899 Other long term (current) drug therapy: Secondary | ICD-10-CM

## 2019-05-18 DIAGNOSIS — E119 Type 2 diabetes mellitus without complications: Secondary | ICD-10-CM

## 2019-05-18 DIAGNOSIS — Z794 Long term (current) use of insulin: Secondary | ICD-10-CM

## 2019-05-18 DIAGNOSIS — Z803 Family history of malignant neoplasm of breast: Secondary | ICD-10-CM

## 2019-05-18 DIAGNOSIS — Z7901 Long term (current) use of anticoagulants: Secondary | ICD-10-CM

## 2019-05-18 DIAGNOSIS — Z7982 Long term (current) use of aspirin: Secondary | ICD-10-CM

## 2019-05-18 NOTE — Progress Notes (Signed)
[place] OFFICE PROGRESS NOTE  Patient Care Team: Revelo, Elyse Jarvis, MD as PCP - General (Family Medicine)   SUMMARY OF ONCOLOGIC HISTORY: #October 2019-MGUS IgA kappa 0.  2 g/dL [Dr.Balch; elevated ESR];   #  Anterior mesenteric nodule- 16x13m [sep 2016; smaller; Feb 2016- S/p Bx- leiomyoma.]; Ilecolic LN [[6SA sep 26301 improving].2016- Octreotide scan- NEG;  CT SEP 2017- NED [elevated chromogranin  Levels- slightly]; July 2019- CT STABLE; Oct 2019- [facility]- imaging reviewed; No further surveillaince  # CKD stage III/diabetes; hemoglobin 10 [EGD-2016; colo-2018-Dr.Elliot]  # Retro-tracheal duplication cyst/ dysphagia   INTERVAL HISTORY:   63year old female patient history of  abdominal/anterior mesenteric nodule is here for follow-up.  Patient Complains of mild fatigue.  Denies any nausea vomiting.  Denies any chest pain or shortness breath or cough.  Denies abdominal pain.  Chronic back pain.  Denies any blood in stools or black or stools.   Review of Systems  Constitutional: Positive for malaise/fatigue. Negative for chills, diaphoresis, fever and weight loss.  HENT: Negative for nosebleeds and sore throat.   Eyes: Negative for double vision.  Respiratory: Negative for cough, hemoptysis, sputum production, shortness of breath and wheezing.   Cardiovascular: Negative for chest pain, palpitations, orthopnea and leg swelling.  Gastrointestinal: Negative for abdominal pain, blood in stool, constipation, diarrhea, heartburn, melena, nausea and vomiting.  Genitourinary: Negative for dysuria, frequency and urgency.  Musculoskeletal: Positive for back pain and joint pain.  Skin: Negative.  Negative for itching and rash.  Neurological: Negative for dizziness, tingling, focal weakness, weakness and headaches.  Endo/Heme/Allergies: Does not bruise/bleed easily.  Psychiatric/Behavioral: Negative for depression. The patient is not nervous/anxious and does not have  insomnia.      PAST MEDICAL HISTORY :  Past Medical History:  Diagnosis Date  . Anemia   . Arthritis   . BBB (bundle branch block)   . Bradycardia   . Coronary artery disease   . Diabetes mellitus without complication ([place] [place]   . DM type 2 with diabetic peripheral neuropathy ([place] 09/19/2015  . Dysphagia   . Dysrhythmia   . GERD (gastroesophageal reflux disease)   . Headache   . History of hiatal hernia   . Hypertension   . Mesenteric mass   . Rotator cuff tendinitis, left   . Unspecified atrial fibrillation ([facility]     PAST SURGICAL HISTORY :   Past Surgical History:  Procedure Laterality Date  . ABDOMINAL HYSTERECTOMY    . CHOLECYSTECTOMY    . COLONOSCOPY WITH PROPOFOL N/A 12/20/2016   Procedure: COLONOSCOPY WITH PROPOFOL;  Surgeon: RManya Silvas MD;  [facility]  Service: Endoscopy;  Laterality: N/A;  . CORONARY ARTERY BYPASS GRAFT    . ESOPHAGOGASTRODUODENOSCOPY (EGD) WITH PROPOFOL N/A 04/27/2017   Procedure: ESOPHAGOGASTRODUODENOSCOPY (EGD) WITH PROPOFOL;  Surgeon: EManya Silvas MD;  [facility]  Service: Endoscopy;  Laterality: N/A;  . EUS N/A 04/24/2015   Procedure: ESOPHAGEAL ENDOSCOPIC ULTRASOUND (EUS) RADIAL;  Surgeon: MCora Daniels MD;  [facility];  Service: Endoscopy;  Laterality: N/A;  . THYROIDECTOMY, PARTIAL      FAMILY HISTORY :   Family History  Problem Relation Age of Onset  . Breast cancer Neg Hx     SOCIAL HISTORY:   Social History   Tobacco Use  . Smoking status: Never Smoker  . Smokeless tobacco: Never Used  Substance Use Topics  . Alcohol use: No    Alcohol/week: 0.0 standard drinks  . Drug use: No  ALLERGIES:  is allergic to other; etodolac; iodinated diagnostic agents; and oxycodone-acetaminophen.  MEDICATIONS:  Current Outpatient Medications  Medication Sig Dispense Refill  . amLODipine (NORVASC) 5 MG tablet Take 2.5 mg by mouth.    Marland Kitchen aspirin EC 81 MG tablet Take 1 tablet by  mouth daily.    . Calcium Citrate-Vitamin D (CALCIUM CITRATE + D3 PO) Take 660 mg by mouth daily.     . cholecalciferol (VITAMIN D) 400 units TABS tablet Take 400 Units by mouth.    . clotrimazole (LOTRIMIN) 1 % cream Apply 1 application topically 2 (two) times daily.    . ferrous sulfate 325 (65 FE) MG tablet Take 325 mg by mouth daily with breakfast.    . fluticasone (FLONASE) 50 MCG/ACT nasal spray Place into the nose.    . furosemide (LASIX) 40 MG tablet Take 1 tablet by mouth daily.    Marland Kitchen gabapentin (NEURONTIN) 300 MG capsule Take 1 capsule by mouth daily as needed (nerve pain).     . insulin aspart (NOVOLOG FLEXPEN) 100 UNIT/ML FlexPen Take by mouth.     . Insulin Disposable Pump (OMNIPOD DASH 5 PACK PODS) MISC CHANGE POD Q 48 H UTD    . linaclotide (LINZESS) 72 MCG capsule Take 1 capsule by mouth daily.    . metFORMIN (GLUCOPHAGE) 500 MG tablet Take 1 tablet by mouth daily before breakfast. And 2 tablets at dinner daily    . Rivaroxaban (XARELTO) 15 MG TABS tablet Take 1 tablet by mouth daily.    . sotalol (BETAPACE) 80 MG tablet Take 1 tablet by mouth 2 (two) times a day.    Marland Kitchen HYDROcodone-acetaminophen (NORCO/VICODIN) 5-325 MG tablet Take 1 tablet by mouth 2 (two) times daily as needed for moderate pain. (Patient not taking: Reported on 05/18/2019) 30 tablet 0  . lansoprazole (PREVACID) 30 MG capsule Take 30 mg by mouth daily before breakfast.    . metFORMIN (GLUCOPHAGE-XR) 500 MG 24 hr tablet Take 500 mg by mouth daily.     . Multiple Vitamin (MULTI-VITAMIN) tablet Take 1 tablet by mouth daily.    . potassium chloride SA (K-DUR,KLOR-CON) 20 MEQ tablet Take 2 tablets (40 mEq total) by mouth once. (Patient taking differently: Take 20 mEq by mouth daily. ) 14 tablet 0  . pravastatin (PRAVACHOL) 10 MG tablet Take 1 tablet by mouth daily.    . promethazine (PHENERGAN) 25 MG tablet Take 25 mg by mouth every 6 (six) hours as needed for nausea or vomiting.    Marland Kitchen spironolactone (ALDACTONE) 100 MG  tablet Take 100 mg by mouth daily.    Marland Kitchen telmisartan (MICARDIS) 80 MG tablet Take 80 mg by mouth daily.      No current facility-administered medications for this visit.     PHYSICAL EXAMINATION: ECOG PERFORMANCE STATUS: 0 - Asymptomatic  BP 128/78   Pulse 81   Temp (!) 96.7 F (35.9 C) (Tympanic)   Resp 20   Ht _0  (1.702 m)   Wt 241 lb (109.3 kg)   BMI 37.75 kg/m   Filed Weights   05/18/19 1401  Weight: 241 lb (109.3 kg)    Physical Exam  Constitutional: She is oriented to person, place, and time and well-developed, well-nourished, and in no distress.  Patient is alone.  HENT:  Head: Normocephalic and atraumatic.  Mouth/Throat: Oropharynx is clear and moist. No oropharyngeal exudate.  Eyes: Pupils are equal, round, and reactive to light.  Neck: Normal range of motion. Neck supple.  Cardiovascular: Normal rate  and regular rhythm.  Pulmonary/Chest: No respiratory distress. She has no wheezes.  Abdominal: Soft. Bowel sounds are normal. She exhibits no distension and no mass. There is no abdominal tenderness. There is no rebound and no guarding.  Musculoskeletal: Normal range of motion.        General: No tenderness or edema.  Neurological: She is alert and oriented to person, place, and time.  Skin: Skin is warm.  Psychiatric: Affect normal.     LABORATORY DATA:  I have reviewed the data as listed    Component Value Date/Time   NA 137 05/11/2019 1405   NA 136 03/10/2015 1640   K 4.6 05/11/2019 1405   K 2.7 (L) 03/10/2015 1640   CL 107 05/11/2019 1405   CL 96 (L) 03/10/2015 1640   CO2 21 (L) 05/11/2019 1405   CO2 31 03/10/2015 1640   GLUCOSE 139 (H) 05/11/2019 1405   GLUCOSE 99 03/10/2015 1640   BUN 40 (H) 05/11/2019 1405   BUN 16 03/10/2015 1640   CREATININE 1.45 (H) 05/11/2019 1405   CREATININE 1.18 (H) 03/10/2015 1640   CALCIUM 9.2 05/11/2019 1405   CALCIUM 9.0 03/10/2015 1640   PROT 7.6 05/11/2019 1405   PROT 7.7 03/10/2015 1640   ALBUMIN 4.0  05/11/2019 1405   ALBUMIN 4.1 03/10/2015 1640   AST 16 05/11/2019 1405   AST 34 03/10/2015 1640   ALT 14 05/11/2019 1405   ALT 39 03/10/2015 1640   ALKPHOS 80 05/11/2019 1405   ALKPHOS 60 03/10/2015 1640   BILITOT 0.4 05/11/2019 1405   BILITOT 0.4 03/10/2015 1640   GFRNONAA 38 (L) 05/11/2019 1405   GFRNONAA 50 (L) 03/10/2015 1640   GFRAA 44 (L) 05/11/2019 1405   GFRAA 58 (L) 03/10/2015 1640    No results found for: SPEP, UPEP  Lab Results  Component Value Date   WBC 7.0 05/11/2019   NEUTROABS 4.5 05/11/2019   HGB 10.0 (L) 05/11/2019   HCT 31.2 (L) 05/11/2019   MCV 87.2 05/11/2019   PLT 245 05/11/2019      Chemistry      Component Value Date/Time   NA 137 05/11/2019 1405   NA 136 03/10/2015 1640   K 4.6 05/11/2019 1405   K 2.7 (L) 03/10/2015 1640   CL 107 05/11/2019 1405   CL 96 (L) 03/10/2015 1640   CO2 21 (L) 05/11/2019 1405   CO2 31 03/10/2015 1640   BUN 40 (H) 05/11/2019 1405   BUN 16 03/10/2015 1640   CREATININE 1.45 (H) 05/11/2019 1405   CREATININE 1.18 (H) 03/10/2015 1640      Component Value Date/Time   CALCIUM 9.2 05/11/2019 1405   CALCIUM 9.0 03/10/2015 1640   ALKPHOS 80 05/11/2019 1405   ALKPHOS 60 03/10/2015 1640   AST 16 05/11/2019 1405   AST 34 03/10/2015 1640   ALT 14 05/11/2019 1405   ALT 39 03/10/2015 1640   BILITOT 0.4 05/11/2019 1405   BILITOT 0.4 03/10/2015 1640      ASSESSMENT & PLAN:   MGUS (monoclonal gammopathy of unknown significance) # MGUS- IgA- 0.2gm/dl; UPEP negative; interestingly June 2020-myeloma work-up negative.  Monitor for now  #Mild worsening anemia-hemoglobin 10/CKD-iron deficiency.  Patient on iron pills once a day.  Recommend taking Vitron C.  We will check iron studies/B12 for the Next visit.  #CKD stage III- no NSAIDs.[ Lasix q OD];  #Diabetes-hemoglobin A1c most recently 8.6 not well controlled.  Defer to PCP  # Thyroid nodules- followed by Dr.Solum.  DISPOSITION:  # follow up in 3 months-MD labs-  cbc/bmp; iron studies ferritin/b12 folate- Dr.B     Cammie Sickle, MD 05/20/2019 9:28 PM

## 2019-05-18 NOTE — Patient Instructions (Signed)
Take Vitron C 1 tablet daily.

## 2019-05-18 NOTE — Assessment & Plan Note (Addendum)
#   MGUS- IgA- 0.2gm/dl; UPEP negative; interestingly June 2020-myeloma work-up negative.  Monitor for now  #Mild worsening anemia-hemoglobin 10/CKD-iron deficiency.  Patient on iron pills once a day.  Recommend taking Vitron C.  We will check iron studies/B12 for the Next visit.  #CKD stage III- no NSAIDs.[ Lasix q OD];  #Diabetes-hemoglobin A1c most recently 8.6 not well controlled.  Defer to PCP  # Thyroid nodules- followed by Dr.Solum.    DISPOSITION:  # follow up in 3 months-MD labs- cbc/bmp; iron studies ferritin/b12 folate- Dr.B

## 2019-06-29 DIAGNOSIS — D509 Iron deficiency anemia, unspecified: Secondary | ICD-10-CM | POA: Insufficient documentation

## 2019-07-01 DIAGNOSIS — R109 Unspecified abdominal pain: Secondary | ICD-10-CM | POA: Insufficient documentation

## 2019-07-01 DIAGNOSIS — K581 Irritable bowel syndrome with constipation: Secondary | ICD-10-CM | POA: Insufficient documentation

## 2019-08-17 ENCOUNTER — Inpatient Hospital Stay: Payer: Medicare HMO

## 2019-08-17 ENCOUNTER — Inpatient Hospital Stay: Payer: Medicare HMO | Attending: Internal Medicine | Admitting: Internal Medicine

## 2019-08-17 ENCOUNTER — Encounter: Payer: Self-pay | Admitting: Internal Medicine

## 2019-08-17 ENCOUNTER — Other Ambulatory Visit: Payer: Self-pay

## 2019-08-17 DIAGNOSIS — Z7982 Long term (current) use of aspirin: Secondary | ICD-10-CM | POA: Diagnosis not present

## 2019-08-17 DIAGNOSIS — D472 Monoclonal gammopathy: Secondary | ICD-10-CM

## 2019-08-17 DIAGNOSIS — D631 Anemia in chronic kidney disease: Secondary | ICD-10-CM | POA: Diagnosis not present

## 2019-08-17 DIAGNOSIS — Z90710 Acquired absence of both cervix and uterus: Secondary | ICD-10-CM | POA: Diagnosis not present

## 2019-08-17 DIAGNOSIS — Z7901 Long term (current) use of anticoagulants: Secondary | ICD-10-CM | POA: Insufficient documentation

## 2019-08-17 DIAGNOSIS — Z79899 Other long term (current) drug therapy: Secondary | ICD-10-CM | POA: Diagnosis not present

## 2019-08-17 DIAGNOSIS — E1122 Type 2 diabetes mellitus with diabetic chronic kidney disease: Secondary | ICD-10-CM | POA: Diagnosis not present

## 2019-08-17 DIAGNOSIS — I10 Essential (primary) hypertension: Secondary | ICD-10-CM | POA: Diagnosis not present

## 2019-08-17 DIAGNOSIS — N183 Chronic kidney disease, stage 3 unspecified: Secondary | ICD-10-CM

## 2019-08-17 DIAGNOSIS — Z794 Long term (current) use of insulin: Secondary | ICD-10-CM | POA: Insufficient documentation

## 2019-08-17 DIAGNOSIS — E1142 Type 2 diabetes mellitus with diabetic polyneuropathy: Secondary | ICD-10-CM | POA: Diagnosis not present

## 2019-08-17 HISTORY — DX: Anemia in chronic kidney disease: D63.1

## 2019-08-17 HISTORY — DX: Chronic kidney disease, stage 3 unspecified: N18.30

## 2019-08-17 LAB — IRON AND TIBC
Iron: 52 ug/dL (ref 28–170)
Saturation Ratios: 18 % (ref 10.4–31.8)
TIBC: 284 ug/dL (ref 250–450)
UIBC: 232 ug/dL

## 2019-08-17 LAB — CBC WITH DIFFERENTIAL/PLATELET
Abs Immature Granulocytes: 0.01 10*3/uL (ref 0.00–0.07)
Basophils Absolute: 0 10*3/uL (ref 0.0–0.1)
Basophils Relative: 1 %
Eosinophils Absolute: 0.2 10*3/uL (ref 0.0–0.5)
Eosinophils Relative: 3 %
HCT: 31.2 % — ABNORMAL LOW (ref 36.0–46.0)
Hemoglobin: 9.9 g/dL — ABNORMAL LOW (ref 12.0–15.0)
Immature Granulocytes: 0 %
Lymphocytes Relative: 33 %
Lymphs Abs: 2.3 10*3/uL (ref 0.7–4.0)
MCH: 28 pg (ref 26.0–34.0)
MCHC: 31.7 g/dL (ref 30.0–36.0)
MCV: 88.4 fL (ref 80.0–100.0)
Monocytes Absolute: 0.6 10*3/uL (ref 0.1–1.0)
Monocytes Relative: 8 %
Neutro Abs: 3.8 10*3/uL (ref 1.7–7.7)
Neutrophils Relative %: 55 %
Platelets: 257 10*3/uL (ref 150–400)
RBC: 3.53 MIL/uL — ABNORMAL LOW (ref 3.87–5.11)
RDW: 13.5 % (ref 11.5–15.5)
WBC: 7 10*3/uL (ref 4.0–10.5)
nRBC: 0 % (ref 0.0–0.2)

## 2019-08-17 LAB — BASIC METABOLIC PANEL
Anion gap: 7 (ref 5–15)
BUN: 30 mg/dL — ABNORMAL HIGH (ref 8–23)
CO2: 24 mmol/L (ref 22–32)
Calcium: 8.8 mg/dL — ABNORMAL LOW (ref 8.9–10.3)
Chloride: 107 mmol/L (ref 98–111)
Creatinine, Ser: 1.4 mg/dL — ABNORMAL HIGH (ref 0.44–1.00)
GFR calc Af Amer: 46 mL/min — ABNORMAL LOW (ref >60)
GFR calc non Af Amer: 39 mL/min — ABNORMAL LOW (ref >60)
Glucose, Bld: 92 mg/dL (ref 70–99)
Potassium: 4.4 mmol/L (ref 3.5–5.1)
Sodium: 138 mmol/L (ref 135–145)

## 2019-08-17 LAB — VITAMIN B12: Vitamin B-12: 1319 pg/mL — ABNORMAL HIGH (ref 180–914)

## 2019-08-17 LAB — FERRITIN: Ferritin: 114 ng/mL (ref 11–307)

## 2019-08-17 LAB — FOLATE: Folate: 15.5 ng/mL (ref >5.9)

## 2019-08-17 NOTE — Assessment & Plan Note (Addendum)
#  Mild worsening anemia-hemoglobin hemoglobin 9.9/secondary to CKD-iron deficiency.  Slightly worse.  Patient on iron pills once a day.   #Awaiting on iron numbers today.  If low borderline would recommend IV iron. Discussed the potential acute infusion reactions with IV iron; which are quite rare.  Patient understands the risk; will proceed with infusions if needed.  # Discussed use of erythropoietin stimulating agents like Aranesp to stimulate the bone marrow.  Discussed the potential issues with erythropoietin estimating agents-given the risk of stroke thromboembolic events/elevated blood pressure.  However, most of the serious events did not happen when the goal hematocrit is 33/hemoglobin 30.   #CKD stage III- no NSAIDs.[ Lasix q OD]; stable.  #Diabetes-hemoglobin A1c most recently 7.6; stable.  Continue follow-up with endocrinology.  DISPOSITION: will call with labs-  # follow up to [facility]- Dr.B

## 2019-08-17 NOTE — Progress Notes (Signed)
[place] OFFICE PROGRESS NOTE  [place] Care Team: Revelo, Elyse Jarvis, MD as PCP - General (Family Medicine)   SUMMARY OF ONCOLOGIC HISTORY: #October 2019-MGUS IgA kappa 0.  2 g/dL [Dr.Balch; elevated ESR]; June 2020 repeat M protein-negative  #Anemia hemoglobin 9.9/CKD stage III  #  Anterior mesenteric nodule- 16x72m [sep 2016; smaller; Feb 2016- S/p Bx- leiomyoma.]; Ilecolic LN [[9BZ sep 21696 improving].2016- Octreotide scan- NEG;  CT SEP 2017- NED [elevated chromogranin  Levels- slightly]; July 2019- CT STABLE; Oct 2019- [facility]- imaging reviewed; No further surveillaince  # CKD stage III/diabetes; hemoglobin 10 [EGD-2016; colo-2018-Dr.Elliot]  # Retro-tracheal duplication cyst   INTERVAL HISTORY:   Hailey [facility] history of  abdominal/anterior mesenteric nodule is here for follow-up.  [facility] complains of mild to moderate fatigue.  Denies any nausea vomiting.  Denies abdominal pain.  Chronic back pain joint pains.  No blood in stools or black or stools.  Chronic mild swelling in the legs.   Review of Systems  Constitutional: Positive for malaise/fatigue. Negative for chills, diaphoresis, fever and weight loss.  HENT: Negative for nosebleeds and sore throat.   Eyes: Negative for double vision.  Respiratory: Negative for cough, hemoptysis, sputum production, shortness of breath and wheezing.   Cardiovascular: Negative for chest pain, palpitations, orthopnea and leg swelling.  Gastrointestinal: Negative for abdominal pain, blood in stool, constipation, diarrhea, heartburn, melena, nausea and vomiting.  Genitourinary: Negative for dysuria, frequency and urgency.  Musculoskeletal: Positive for back pain and joint pain.  Skin: Negative.  Negative for itching and rash.  Neurological: Negative for dizziness, tingling, focal weakness, weakness and headaches.  Endo/Heme/Allergies: Does not bruise/bleed easily.  Psychiatric/Behavioral: Negative for  depression. The [place] is not nervous/anxious and does not have insomnia.      PAST MEDICAL HISTORY :  Past Medical History:  Diagnosis Date  . Anemia   . Anemia of chronic kidney failure, stage 3 (moderate) ([facility] 08/17/2019  . Arthritis   . BBB (bundle branch block)   . Bradycardia   . Coronary artery disease   . Diabetes mellitus without complication ([facility]   . DM type 2 with diabetic peripheral neuropathy ([facility] 09/19/2015  . Dysphagia   . Dysrhythmia   . GERD (gastroesophageal reflux disease)   . Headache   . History of hiatal hernia   . Hypertension   . Mesenteric mass   . Rotator cuff tendinitis, left   . Unspecified atrial fibrillation ([place]     PAST SURGICAL HISTORY :   Past Surgical History:  Procedure Laterality Date  . ABDOMINAL HYSTERECTOMY    . CHOLECYSTECTOMY    . COLONOSCOPY WITH PROPOFOL N/A 12/20/2016   Procedure: COLONOSCOPY WITH PROPOFOL;  Surgeon: RManya Silvas MD;  [facility];  Service: Endoscopy;  Laterality: N/A;  . CORONARY ARTERY BYPASS GRAFT    . ESOPHAGOGASTRODUODENOSCOPY (EGD) WITH PROPOFOL N/A 04/27/2017   Procedure: ESOPHAGOGASTRODUODENOSCOPY (EGD) WITH PROPOFOL;  Surgeon: EManya Silvas MD;  [facility];  Service: Endoscopy;  Laterality: N/A;  . EUS N/A 04/24/2015   Procedure: ESOPHAGEAL ENDOSCOPIC ULTRASOUND (EUS) RADIAL;  Surgeon: MCora Daniels MD;  [facility];  Service: Endoscopy;  Laterality: N/A;  . THYROIDECTOMY, PARTIAL      FAMILY HISTORY :   Family History  Problem Relation Age of Onset  . Breast cancer Neg Hx     SOCIAL HISTORY:   Social History   Tobacco Use  . Smoking status: Never Smoker  . Smokeless tobacco: Never Used  Substance Use Topics  . Alcohol use: No    Alcohol/week: 0.0 standard drinks  . Drug use: No    ALLERGIES:  is allergic to other; etodolac; iodinated diagnostic agents; and oxycodone-acetaminophen.  MEDICATIONS:  Current Outpatient Medications   Medication Sig Dispense Refill  . amLODipine (NORVASC) 5 MG tablet Take 2.5 mg by mouth.    . cholecalciferol (VITAMIN D) 400 units TABS tablet Take 400 Units by mouth.    . clotrimazole (LOTRIMIN) 1 % cream Apply 1 application topically 2 (two) times daily.    . ferrous sulfate 325 (65 FE) MG tablet Take 325 mg by mouth daily with breakfast.    . fluticasone (FLONASE) 50 MCG/ACT nasal spray Place into the nose.    . furosemide (LASIX) 40 MG tablet Take 1 tablet by mouth daily.    Marland Kitchen gabapentin (NEURONTIN) 300 MG capsule Take 1 capsule by mouth daily as needed (nerve pain).     . insulin aspart (NOVOLOG FLEXPEN) 100 UNIT/ML FlexPen Take by mouth.     . Insulin Disposable Pump (OMNIPOD DASH 5 PACK PODS) MISC CHANGE POD Q 48 H UTD    . lansoprazole (PREVACID) 30 MG capsule Take 30 mg by mouth daily before breakfast.    . linaclotide (LINZESS) 72 MCG capsule Take 1 capsule by mouth daily.    . metFORMIN (GLUCOPHAGE) 500 MG tablet Take 1 tablet by mouth daily before breakfast. And 2 tablets at dinner daily    . metFORMIN (GLUCOPHAGE-XR) 500 MG 24 hr tablet Take 500 mg by mouth daily.     . Multiple Vitamin (MULTI-VITAMIN) tablet Take 1 tablet by mouth daily.    . pravastatin (PRAVACHOL) 10 MG tablet Take 1 tablet by mouth daily.    . promethazine (PHENERGAN) 25 MG tablet Take 25 mg by mouth every 6 (six) hours as needed for nausea or vomiting.    . Rivaroxaban (XARELTO) 15 MG TABS tablet Take 1 tablet by mouth daily.    . sotalol (BETAPACE) 80 MG tablet Take 1 tablet by mouth 2 (two) times a day.    . spironolactone (ALDACTONE) 100 MG tablet Take 100 mg by mouth daily.    Marland Kitchen telmisartan (MICARDIS) 80 MG tablet Take 80 mg by mouth daily.     Marland Kitchen aspirin EC 81 MG tablet Take 1 tablet by mouth daily.    . Calcium Citrate-Vitamin D (CALCIUM CITRATE + D3 PO) Take 660 mg by mouth daily.     Marland Kitchen HYDROcodone-acetaminophen (NORCO/VICODIN) 5-325 MG tablet Take 1 tablet by mouth 2 (two) times daily as needed for  moderate pain. ([facility] not taking: Reported on 05/18/2019) 30 tablet 0  . potassium chloride SA (K-DUR,KLOR-CON) 20 MEQ tablet Take 2 tablets (40 mEq total) by mouth once. ([facility] taking differently: Take 20 mEq by mouth daily. ) 14 tablet 0   No current facility-administered medications for this visit.     PHYSICAL EXAMINATION: ECOG PERFORMANCE STATUS: 0 - Asymptomatic  BP 132/73 (BP Location: Left Arm, [place] Position: Sitting, Cuff Size: Normal)   Pulse 66   Temp (!) 96.5 F (35.8 C) (Tympanic)   Resp 16   Wt 244 lb (110.7 kg)   BMI 38.22 kg/m   Filed Weights   08/17/19 1408  Weight: 244 lb (110.7 kg)    Physical Exam  Constitutional: She is oriented to person, place, and time and well-developed, well-nourished, and in no distress.  [facility] is alone.  HENT:  Head: Normocephalic and atraumatic.  Mouth/Throat: Oropharynx is clear and  moist. No oropharyngeal exudate.  Eyes: Pupils are equal, round, and reactive to light.  Neck: Normal range of motion. Neck supple.  Cardiovascular: Normal rate and regular rhythm.  Pulmonary/Chest: No respiratory distress. She has no wheezes.  Abdominal: Soft. Bowel sounds are normal. She exhibits no distension and no mass. There is no abdominal tenderness. There is no rebound and no guarding.  Musculoskeletal: Normal range of motion.        General: No tenderness or edema.  Neurological: She is alert and oriented to person, place, and time.  Skin: Skin is warm.  Psychiatric: Affect normal.     LABORATORY DATA:  I have reviewed the data as listed    Component Value Date/Time   NA 138 08/17/2019 1348   NA 136 03/10/2015 1640   K 4.4 08/17/2019 1348   K 2.7 (L) 03/10/2015 1640   CL 107 08/17/2019 1348   CL 96 (L) 03/10/2015 1640   CO2 24 08/17/2019 1348   CO2 31 03/10/2015 1640   GLUCOSE 92 08/17/2019 1348   GLUCOSE 99 03/10/2015 1640   BUN 30 (H) 08/17/2019 1348   BUN 16 03/10/2015 1640   CREATININE 1.40 (H) 08/17/2019 1348    CREATININE 1.18 (H) 03/10/2015 1640   CALCIUM 8.8 (L) 08/17/2019 1348   CALCIUM 9.0 03/10/2015 1640   PROT 7.6 05/11/2019 1405   PROT 7.7 03/10/2015 1640   ALBUMIN 4.0 05/11/2019 1405   ALBUMIN 4.1 03/10/2015 1640   AST 16 05/11/2019 1405   AST 34 03/10/2015 1640   ALT 14 05/11/2019 1405   ALT 39 03/10/2015 1640   ALKPHOS 80 05/11/2019 1405   ALKPHOS 60 03/10/2015 1640   BILITOT 0.4 05/11/2019 1405   BILITOT 0.4 03/10/2015 1640   GFRNONAA 39 (L) 08/17/2019 1348   GFRNONAA 50 (L) 03/10/2015 1640   GFRAA 46 (L) 08/17/2019 1348   GFRAA 58 (L) 03/10/2015 1640    No results found for: SPEP, UPEP  Lab Results  Component Value Date   WBC 7.0 08/17/2019   NEUTROABS 3.8 08/17/2019   HGB 9.9 (L) 08/17/2019   HCT 31.2 (L) 08/17/2019   MCV 88.4 08/17/2019   PLT 257 08/17/2019      Chemistry      Component Value Date/Time   NA 138 08/17/2019 1348   NA 136 03/10/2015 1640   K 4.4 08/17/2019 1348   K 2.7 (L) 03/10/2015 1640   CL 107 08/17/2019 1348   CL 96 (L) 03/10/2015 1640   CO2 24 08/17/2019 1348   CO2 31 03/10/2015 1640   BUN 30 (H) 08/17/2019 1348   BUN 16 03/10/2015 1640   CREATININE 1.40 (H) 08/17/2019 1348   CREATININE 1.18 (H) 03/10/2015 1640      Component Value Date/Time   CALCIUM 8.8 (L) 08/17/2019 1348   CALCIUM 9.0 03/10/2015 1640   ALKPHOS 80 05/11/2019 1405   ALKPHOS 60 03/10/2015 1640   AST 16 05/11/2019 1405   AST 34 03/10/2015 1640   ALT 14 05/11/2019 1405   ALT 39 03/10/2015 1640   BILITOT 0.4 05/11/2019 1405   BILITOT 0.4 03/10/2015 1640      ASSESSMENT & PLAN:   MGUS (monoclonal gammopathy of unknown significance)    Anemia of chronic kidney failure, stage 3 (moderate) (HCC) #Mild worsening anemia-hemoglobin hemoglobin 9.9/secondary to CKD-iron deficiency.  Slightly worse.  [facility] on iron pills once a day.   #Awaiting on iron numbers today.  If low borderline would recommend IV iron. Discussed the potential acute  infusion reactions with  IV iron; which are quite rare.  [facility] understands the risk; will proceed with infusions if needed.  # Discussed use of erythropoietin stimulating agents like Aranesp to stimulate the bone marrow.  Discussed the potential issues with erythropoietin estimating agents-given the risk of stroke thromboembolic events/elevated blood pressure.  However, most of the serious events did not happen when the goal hematocrit is 33/hemoglobin 30.   #CKD stage III- no NSAIDs.[ Lasix q OD];  #Diabetes-hemoglobin A1c most recently 7.6; stable.  Continue follow-up with endocrinology.  DISPOSITION: will call with labs-  # follow up to [facility]- Dr.B      Cammie Sickle, MD 08/17/2019 3:36 PM

## 2019-08-24 ENCOUNTER — Encounter: Payer: Self-pay | Admitting: Internal Medicine

## 2019-08-27 ENCOUNTER — Other Ambulatory Visit: Payer: Self-pay | Admitting: Internal Medicine

## 2019-08-27 ENCOUNTER — Telehealth: Payer: Self-pay | Admitting: Internal Medicine

## 2019-08-27 NOTE — Telephone Encounter (Signed)
Discussed with the patient that her iron saturation 18%; hemoglobin is 9.9.  Anemia is likely from chronic kidney disease.   Recommend continued p.o. iron for now; if worse would recommend IV iron/erythropoietin injections.  Please have the patient follow-up with me in 3 months-MD;labs--CBC BMP/possible Venofer.

## 2019-08-28 ENCOUNTER — Other Ambulatory Visit: Payer: Self-pay | Admitting: *Deleted

## 2019-08-28 DIAGNOSIS — D631 Anemia in chronic kidney disease: Secondary | ICD-10-CM

## 2019-08-28 DIAGNOSIS — N183 Chronic kidney disease, stage 3 unspecified: Secondary | ICD-10-CM

## 2019-08-28 DIAGNOSIS — D472 Monoclonal gammopathy: Secondary | ICD-10-CM

## 2019-11-01 ENCOUNTER — Other Ambulatory Visit: Payer: Self-pay

## 2019-11-01 ENCOUNTER — Encounter: Payer: Self-pay | Admitting: General Surgery

## 2019-11-01 ENCOUNTER — Telehealth: Payer: Self-pay

## 2019-11-01 ENCOUNTER — Ambulatory Visit: Payer: Medicare HMO | Admitting: General Surgery

## 2019-11-01 VITALS — BP 129/82 | HR 73 | Temp 97.2°F | Ht 67.0 in | Wt 243.2 lb

## 2019-11-01 DIAGNOSIS — E042 Nontoxic multinodular goiter: Secondary | ICD-10-CM

## 2019-11-01 NOTE — Patient Instructions (Addendum)
Our surgery scheduler Angie will contact you within the next 24-48 hours to discuss the surgery date and prep for surgery. Please have the BLUE sheet available when she contacts you. If you have any questions or concerns, please feel free to contact our office.  Patient would need to stop medication Xarelto three days prior to your surgery date.   Thyroid Nodule  A thyroid nodule is an isolated growth of thyroid cells that forms a lump in your thyroid gland. The thyroid gland is a butterfly-shaped gland. It is found in the lower front of your neck. This gland sends chemical messengers (hormones) through your blood to all parts of your body. These hormones are important in regulating your body temperature and helping your body to use energy. Thyroid nodules are common. Most are not cancerous (benign). You may have one nodule or several nodules. Different types of thyroid nodules include nodules that:  Grow and fill with fluid (thyroid cysts).  Produce too much thyroid hormone (hot nodules or hyperthyroid).  Produce no thyroid hormone (cold nodules or hypothyroid).  Form from cancer cells (thyroid cancers). What are the causes? In most cases, the cause of this condition is not known. What increases the risk? The following factors may make you more likely to develop this condition.  Age. Thyroid nodules become more common in people who are older than 65 years of age.  Gender. ? Benign thyroid nodules are more common in women. ? Cancerous (malignant) thyroid nodules are more common in men.  A family history that includes: ? Thyroid nodules. ? Pheochromocytoma. ? Thyroid carcinoma. ? Hyperparathyroidism.  Certain kinds of thyroid diseases, such as Hashimoto's thyroiditis.  Lack of iodine in your diet.  A history of head and neck radiation, such as from previous cancer treatment. What are the signs or symptoms? In many cases, there are no symptoms. If you have symptoms, they may  include:  A lump in your lower neck.  Feeling a lump or tickle in your throat.  Pain in your neck, jaw, or ear.  Having trouble swallowing. Hot nodules may cause symptoms that include:  Weight loss.  Warm, flushed skin.  Feeling hot.  Feeling nervous.  A racing heartbeat. Cold nodules may cause symptoms that include:  Weight gain.  Dry skin.  Brittle hair. This may also occur with hair loss.  Feeling cold.  Fatigue. Thyroid cancer nodules may cause symptoms that include:  Hard nodules that feel stuck to the thyroid gland.  Hoarseness.  Lumps in the glands near your thyroid (lymph nodes). How is this diagnosed? A thyroid nodule may be felt by your health care provider during a physical exam. This condition may also be diagnosed based on your symptoms. You may also have tests, including:  An ultrasound. This may be done to confirm the diagnosis.  A biopsy. This involves taking a sample from the nodule and looking at it under a microscope.  Blood tests to make sure that your thyroid is working properly.  A thyroid scan. This test uses a radioactive tracer injected into a vein to create an image of the thyroid gland on a computer screen.  Imaging tests such as MRI or CT scan. These may be done if: ? Your nodule is large. ? Your nodule is blocking your airway. ? Cancer is suspected. How is this treated? Treatment depends on the cause and size of your nodule or nodules. If the nodule is benign, treatment may not be necessary. Your health care provider may  monitor the nodule to see if it goes away without treatment. If the nodule continues to grow, is cancerous, or does not go away, treatment may be needed. Treatment may include:  Having a cystic nodule drained with a needle.  Ablation therapy. In this treatment, alcohol is injected into the area of the nodule to destroy the cells. Ablation with heat (thermal ablation) may also be used.  Radioactive iodine. In  this treatment, radioactive iodine is given as a pill or liquid that you drink. This substance causes the thyroid nodule to shrink.  Surgery to remove the nodule. Part or all of your thyroid gland may need to be removed as well.  Medicines. Follow these instructions at home:  Pay attention to any changes in your nodule.  Take over-the-counter and prescription medicines only as told by your health care provider.  Keep all follow-up visits as told by your health care provider. This is important. Contact a health care provider if:  Your voice changes.  You have trouble swallowing.  You have pain in your neck, ear, or jaw that is getting worse.  Your nodule gets bigger.  Your nodule starts to make it harder for you to breathe.  Your muscles look like they are shrinking (muscle wasting). Get help right away if:  You have chest pain.  There is a loss of consciousness.  You have a sudden fever.  You feel confused.  You are seeing or hearing things that other people do not see or hear (having hallucinations).  You feel very weak.  You have mood swings.  You feel very restless.  You feel suddenly nauseous or throw up.  You suddenly have diarrhea. Summary  A thyroid nodule is an isolated growth of thyroid cells that forms a lump in your thyroid gland.  Thyroid nodules are common. Most are not cancerous (benign). You may have one nodule or several nodules.  Treatment depends on the cause and size of your nodule or nodules. If the nodule is benign, treatment may not be necessary.  Your health care provider may monitor the nodule to see if it goes away without treatment. If the nodule continues to grow, is cancerous, or does not go away, treatment may be needed. This information is not intended to replace advice given to you by your health care provider. Make sure you discuss any questions you have with your health care provider. Document Released: 10/01/2004 Document  Revised: 06/23/2018 Document Reviewed: 06/26/2018 Elsevier Patient Education  2020 [employer].

## 2019-11-01 NOTE — Telephone Encounter (Signed)
Cardiac Clearance was faxed over to [facility] office at [phone].

## 2019-11-01 NOTE — Progress Notes (Signed)
Patient ID: Hailey Howard, female   DOB: 1956-03-29, 63 y.o.   MRN: [id number]  Chief Complaint  Patient presents with  . Surgery Discussion    eval for multinodular goiter w/compressive symptoms    HPI Hailey Howard is a 63 y.o. female.   She has a history of prior partial thyroid resection in 2010.  She thinks this occurred on the right but we do not have access to those records.  Recent thyroid ultrasound suggests that the prior resection was quite limited, as she has essentially a full thyroid, by volume, on both sides.  There is a dominant nodule on the right that measures 5.82 cm in greatest dimension.  This was biopsied in September 2020 and was benign.  Due to compressive symptoms, however, Hailey Howard is interested in completion thyroidectomy.  She endorses dysphagia to both solids and liquids.  She has pressure in her neck while in the supine position.  She describes frequent throat clearing.  There has been no change in her voice.  No heat or cold intolerance.  She has occasional heart palpitations and sometimes feels jittery or anxious.  She describes alopecia, but no change in the texture of her skin or nails.  She does have intermittent constipation and diarrhea but ascribes this to irritable bowel syndrome.  She endorses fatigue.  She denies any significant weight loss or weight gain.  No ocular symptoms.  She currently does not take any thyroid hormone replacement.  She has no family history of thyroid problems or thyroid cancer.  She denies any occupational or therapeutic exposure to head and neck irradiation.   Past Medical History:  Diagnosis Date  . Anemia   . Anemia of chronic kidney failure, stage 3 (moderate) 08/17/2019  . Arthritis   . BBB (bundle branch block)   . Bradycardia   . Coronary artery disease   . Diabetes mellitus without complication ([place])   . DM type 2 with diabetic peripheral neuropathy ([place]) 09/19/2015  . Dysphagia   . Dysrhythmia   . GERD  (gastroesophageal reflux disease)   . Headache   . History of hiatal hernia   . Hypertension   . Mesenteric mass   . Rotator cuff tendinitis, left   . Unspecified atrial fibrillation [facility])     Past Surgical History:  Procedure Laterality Date  . ABDOMINAL HYSTERECTOMY    . CHOLECYSTECTOMY    . COLONOSCOPY WITH PROPOFOL N/A 12/20/2016   Procedure: COLONOSCOPY WITH PROPOFOL;  Surgeon: Manya Silvas, MD;  Location: [facility] ENDOSCOPY;  Service: Endoscopy;  Laterality: N/A;  . CORONARY ARTERY BYPASS GRAFT    . ESOPHAGOGASTRODUODENOSCOPY (EGD) WITH PROPOFOL N/A 04/27/2017   Procedure: ESOPHAGOGASTRODUODENOSCOPY (EGD) WITH PROPOFOL;  Surgeon: Manya Silvas, MD;  Location: [facility] ENDOSCOPY;  Service: Endoscopy;  Laterality: N/A;  . EUS N/A 04/24/2015   Procedure: ESOPHAGEAL ENDOSCOPIC ULTRASOUND (EUS) RADIAL;  Surgeon: Cora Daniels, MD;  Location: [facility] ENDOSCOPY;  Service: Endoscopy;  Laterality: N/A;  . THYROIDECTOMY, PARTIAL      Family History  Problem Relation Age of Onset  . Breast cancer Neg Hx     Social History Social History   Tobacco Use  . Smoking status: Never Smoker  . Smokeless tobacco: Never Used  Substance Use Topics  . Alcohol use: No    Alcohol/week: 0.0 standard drinks  . Drug use: No    Allergies  Allergen Reactions  . Other Hives, Itching and Anaphylaxis    Iv dye  Confirmed with pt  over the phone on 08/01/15 Iv dye   . Etodolac Itching  . Iodinated Diagnostic Agents Other (See Comments)    Hives and itching  . Oxycodone-Acetaminophen Hives    Current Outpatient Medications  Medication Sig Dispense Refill  . amLODipine (NORVASC) 5 MG tablet Take by mouth.    Marland Kitchen BYDUREON BCISE 2 MG/0.85ML AUIJ     . clotrimazole (LOTRIMIN) 1 % cream Apply 1 application topically 2 (two) times daily.    . CONTOUR NEXT TEST test strip     . ferrous sulfate 325 (65 FE) MG tablet Take 325 mg by mouth daily with breakfast.    . FIASP 100 UNIT/ML SOLN     .  fluticasone (FLONASE) 50 MCG/ACT nasal spray Place into the nose.    . furosemide (LASIX) 40 MG tablet Take 1 tablet by mouth daily.    Marland Kitchen gabapentin (NEURONTIN) 300 MG capsule Take 1 capsule by mouth daily as needed (nerve pain).     . Insulin Disposable Pump (OMNIPOD DASH 5 PACK PODS) MISC CHANGE POD Q 48 H UTD    . linaclotide (LINZESS) 72 MCG capsule Take 1 capsule by mouth daily.    . metFORMIN (GLUCOPHAGE) 500 MG tablet Take 1 tablet by mouth daily before breakfast. And 2 tablets at dinner daily    . Multiple Vitamin (MULTI-VITAMIN) tablet Take 1 tablet by mouth daily.    . pravastatin (PRAVACHOL) 10 MG tablet Take 1 tablet by mouth daily.    . Rivaroxaban (XARELTO) 15 MG TABS tablet Take 15 mg by mouth 2 (two) times daily with a meal.    . spironolactone (ALDACTONE) 100 MG tablet Take 100 mg by mouth daily.    . Calcium Citrate-Vitamin D (CALCIUM CITRATE + D3 PO) Take 660 mg by mouth daily.     . insulin aspart (NOVOLOG FLEXPEN) 100 UNIT/ML FlexPen Take by mouth.     . metFORMIN (GLUCOPHAGE-XR) 500 MG 24 hr tablet Take 500 mg by mouth daily.      No current facility-administered medications for this visit.    Review of Systems Review of Systems  All other systems reviewed and are negative. Or as discussed in the history of present illness.  Blood pressure 129/82, pulse 73, temperature (!) 97.2 F (36.2 C), temperature source Temporal, height 5\' 7"  (1.702 m), weight 243 lb 3.2 oz (110.3 kg), SpO2 98 %. Body mass index is 38.09 kg/m.  Physical Exam Physical Exam Constitutional:      General: She is not in acute distress.    Appearance: Normal appearance. She is obese.  HENT:     Head: Normocephalic and atraumatic.     Nose:     Comments: Covered with a mask secondary to COVID-19 precautions    Mouth/Throat:     Comments: Covered with a mask secondary to COVID-19 precautions Eyes:     General: No scleral icterus.       Right eye: No discharge.        Left eye: No  discharge.     Comments: No proptosis or exophthalmos.  Neck:     Comments: The thyroid is enlarged bilaterally.  I am unable to palpate discrete nodules or masses.  The gland moves freely with deglutition and does elevate above the clavicles bilaterally.  She has a low transverse incision just above the clavicles, consistent with prior thyroid operation.  No palpable cervical or supraclavicular lymphadenopathy. Cardiovascular:     Rate and Rhythm: Normal rate and regular rhythm.  Heart sounds: Normal heart sounds.  Pulmonary:     Effort: Pulmonary effort is normal. No respiratory distress.     Breath sounds: Normal breath sounds.  Abdominal:     General: Bowel sounds are normal.     Palpations: Abdomen is soft.     Comments: Protuberant, consistent with her level of obesity.  Genitourinary:    Comments: Deferred Musculoskeletal:     Left lower leg: Edema present.     Comments: Mild nonpitting edema, likely secondary to vein harvest for coronary artery bypass.  Skin:    General: Skin is warm and dry.  Neurological:     General: No focal deficit present.     Mental Status: She is alert and oriented to person, place, and time.  Psychiatric:        Mood and Affect: Mood normal.        Behavior: Behavior normal.     Data Reviewed I reviewed Dr. Francine Graven clinic note from October 21, 2019.  In this note, she documents bilateral enlargement of the thyroid gland as well as the benign biopsy results.  Thyroid function tests were reviewed with in the Care Everywhere function of Epic.  She is euthyroid, with her most recent TSH being 0.821.  I reviewed the report of the thyroid ultrasound performed on July 12, 2019.  I do not have the actual images available for my review.  The radiologist impression is copied here: Indication: Multinodular goiter   Comparison: None  Technique: Gray-scale and color Doppler images of the neck were obtained.  Findings: The right lobe of the thyroid  measures 7.02 x 4.11 x 4.34 cm. The left lobe of the thyroid measures 6.24 x 3.07 x 2.02 cm. The isthmus measures 1.72 cm in AP depth.  Echotexture of the thyroid is heterogeneous.  Within the right lobe, there is a large solitary solid nodule measuring  5.82 x 3.93 x 4.24 cm. It does not contain microcalcifications..  Within the isthmus is a solid heterogeneous 1.91 x 1.62 x 2.14 cm nodule.  Within the left lobe, there are several nodules including an upper pole  hypoechoic 1.69 x 0.90 x 1.20 cm nodule and a lower pole hypoechoic 1.82 x  1.87 x 1.65 cm nodule. Additionally at least 4 sub-centimeter cystic  nodules are present.   There are no significantly enlarged lymph nodes in the anterior neck.  Impression: Heterogeneous thyroid gland which is enlarged. There are multiple nodules  throughout the thyroid gland. This is consistent with a multinodular  goiter.  A dominant right lobe nodule measures up to 5.8 cm. Two left sided solid  hypoechoic nodules measure up to 1.6 and 1.8 cm.  I am able to review the images of the ultrasound performed February 07, 2018.  I do agree with the radiologist's interpretation which is copied here: CLINICAL DATA:  Goiter. Previous FNA biopsy right nodule 04/15/2015.  EXAM: THYROID ULTRASOUND  TECHNIQUE: Ultrasound examination of the thyroid gland and adjacent soft tissues was performed.  COMPARISON:  07/14/2017 and previous back to 03/28/2015  FINDINGS: Parenchymal Echotexture: Markedly heterogenous  Isthmus: 0.4 cm thickness, previously 0.5  Right lobe: 7.3 x 4.1 x 4 cm, previously 6.4 x 3.7 x 3.8  Left lobe: 5.5 x 2.4 x 2 cm, previously 5.5 x 2.3 x 1.8  _________________________________________________________  Estimated total number of nodules >/= 1 cm: 4  Number of spongiform nodules >/=  2 cm not described below (TR1): 0  Number of mixed cystic and solid nodules >/=  1.5 cm not described below ([place]):  0  _________________________________________________________  Nodule # 1:  Location: Left; Superior  Maximum size: 1.6 cm; Other 2 dimensions: 1.3 x 0.8 cm  Composition: solid/almost completely solid (2)  Echogenicity: hypoechoic (2)  Shape: not taller-than-wide (0)  Margins: smooth (0)  Echogenic foci: none (0)  ACR TI-RADS total points: 4.  ACR TI-RADS risk category: TR4 (4-6 points).  ACR TI-RADS recommendations:  **Given size (>/= 1.5 cm) and appearance, fine needle aspiration of this moderately suspicious nodule should be considered based on TI-RADS criteria.  _________________________________________________________  Nodule # 2:  Prior biopsy: No  Location: Left; Inferior  Maximum size: 4.1 cm; Other 2 dimensions: 2.4 x 1.6 cm, previously, 2.1 x 1.7 x 1.1 cm  Composition: mixed cystic and solid (1)  Echogenicity: hypoechoic (2)  Shape: taller-than-wide (3)  Margins: ill-defined (0)  Echogenic foci: none (0)  ACR TI-RADS total points: 6.  ACR TI-RADS risk category:  TR4 (4-6 points).  Significant change in size (>/= 20% in two dimensions and minimal increase of 2 mm): Yes  Change in features: No  Change in ACR TI-RADS risk category: Yes  ACR TI-RADS recommendations:  **Given size (>/= 1.5 cm) and appearance, fine needle aspiration of this moderately suspicious nodule should be considered based on TI-RADS criteria.  _________________________________________________________  Nodule # 3:  Prior biopsy: No  Location: Isthmus; right of midline  Maximum size: 2.6 cm; Other 2 dimensions: 1.9 x 1.7 cm, previously, 1.6 x 1.5 x 1.4 cm  Composition: solid/almost completely solid (2)  Echogenicity: isoechoic (1)  Shape: not taller-than-wide (0)  Margins: ill-defined (0)  Echogenic foci: none (0)  ACR TI-RADS total points: 3.  ACR TI-RADS risk category:  TR3 (3 points).  Significant change  in size (>/= 20% in two dimensions and minimal increase of 2 mm): Yes  Change in features: No  Change in ACR TI-RADS risk category: No  ACR TI-RADS recommendations:  **Given size (>/= 2.5 cm) and appearance, fine needle aspiration of this mildly suspicious nodule should be considered based on TI-RADS criteria.  _________________________________________________________  6.1 x 3.8 x 3.7 cm mid right solid nodule, previously 5.1 x 4.5 x 3.4; this was previously biopsied.  IMPRESSION: 1. Thyromegaly with bilateral nodules. 2. Recommend FNA biopsy of moderately suspicious 1.6 cm superior left and enlarging 4.1 cm inferior left nodules.  The above is in keeping with the ACR TI-RADS recommendations - J Am Coll Radiol 2017;14:587-595.  Assessment This is a 63 year old woman who has a history of partial thyroid resection in the past.  She has a large multinodular goiter with tissue present bilaterally, raising the question of what was actually resected at her initial operation.  She has compressive symptoms and is interested in surgical resection.  I have recommended that she undergo total thyroidectomy.  Plan The risks of thyroid surgery were discussed, including (but not limited to): bleeding, infection, damage to surrounding structures/tissues, injury (temporary or permanent) to the recurrent laryngeal nerve, hypoparathyroidism (temporary or permanent), need for thyroid hormone replacement therapy, need for additional surgery and/or treatment, recurrence of disease, tracheostomy (temporary or permanent).  The patient had the opportunity to ask any questions and these were answered to their satisfaction.  We will work to get her scheduled at the soonest available date.  Due to escalating COVID infections, however, this may need to be delayed as limits are being put on elective operations again.   Fredirick Maudlin 11/01/2019, 12:19 PM

## 2019-11-05 ENCOUNTER — Telehealth: Payer: Self-pay

## 2019-11-05 NOTE — Telephone Encounter (Signed)
Received Cardiac Clearance from [facility]. This patient is optimized for surgery at [facility].

## 2019-11-07 ENCOUNTER — Telehealth: Payer: Self-pay | Admitting: General Surgery

## 2019-11-07 NOTE — Telephone Encounter (Signed)
I have called patient and left a message on patient's voicemail to call the office for surgery information.   Surgery Date: 12/16/18 with Dr Cannon-completion thyroidectomy-total thyroidectomy.   Preadmission Testing Date: 12/12/19 between 8-12:00pm-phone interview.  Covid Testing Date: 12/13/19 between 8-10:30am - patient advised to go to the [facility] ([place])  [employer] Video sent via [employer] [facility] and [employer].  Patient has been made aware to call [phone], between 1-3:00pm the day before surgery, to find out what time to arrive.

## 2019-11-08 ENCOUNTER — Telehealth: Payer: Self-pay | Admitting: General Surgery

## 2019-11-08 NOTE — Telephone Encounter (Signed)
Patient called and was given surgery information.

## 2019-11-15 ENCOUNTER — Other Ambulatory Visit: Payer: Self-pay

## 2019-11-19 ENCOUNTER — Other Ambulatory Visit: Payer: Self-pay

## 2019-11-19 ENCOUNTER — Inpatient Hospital Stay: Payer: Medicare HMO | Admitting: Internal Medicine

## 2019-11-19 ENCOUNTER — Inpatient Hospital Stay: Payer: Medicare HMO | Attending: Internal Medicine

## 2019-11-19 VITALS — BP 140/81 | HR 72

## 2019-11-19 DIAGNOSIS — Z90710 Acquired absence of both cervix and uterus: Secondary | ICD-10-CM | POA: Diagnosis not present

## 2019-11-19 DIAGNOSIS — N183 Chronic kidney disease, stage 3 unspecified: Secondary | ICD-10-CM | POA: Diagnosis not present

## 2019-11-19 DIAGNOSIS — D509 Iron deficiency anemia, unspecified: Secondary | ICD-10-CM | POA: Diagnosis not present

## 2019-11-19 DIAGNOSIS — Z7901 Long term (current) use of anticoagulants: Secondary | ICD-10-CM | POA: Diagnosis not present

## 2019-11-19 DIAGNOSIS — D631 Anemia in chronic kidney disease: Secondary | ICD-10-CM

## 2019-11-19 DIAGNOSIS — Z79899 Other long term (current) drug therapy: Secondary | ICD-10-CM | POA: Diagnosis not present

## 2019-11-19 DIAGNOSIS — D472 Monoclonal gammopathy: Secondary | ICD-10-CM

## 2019-11-19 DIAGNOSIS — E119 Type 2 diabetes mellitus without complications: Secondary | ICD-10-CM | POA: Insufficient documentation

## 2019-11-19 DIAGNOSIS — Z794 Long term (current) use of insulin: Secondary | ICD-10-CM | POA: Diagnosis not present

## 2019-11-19 DIAGNOSIS — I10 Essential (primary) hypertension: Secondary | ICD-10-CM | POA: Insufficient documentation

## 2019-11-19 LAB — CBC WITH DIFFERENTIAL/PLATELET
Abs Immature Granulocytes: 0.03 10*3/uL (ref 0.00–0.07)
Basophils Absolute: 0 10*3/uL (ref 0.0–0.1)
Basophils Relative: 0 %
Eosinophils Absolute: 0.1 10*3/uL (ref 0.0–0.5)
Eosinophils Relative: 2 %
HCT: 35.4 % — ABNORMAL LOW (ref 36.0–46.0)
Hemoglobin: 10.7 g/dL — ABNORMAL LOW (ref 12.0–15.0)
Immature Granulocytes: 0 %
Lymphocytes Relative: 32 %
Lymphs Abs: 2.7 10*3/uL (ref 0.7–4.0)
MCH: 27.1 pg (ref 26.0–34.0)
MCHC: 30.2 g/dL (ref 30.0–36.0)
MCV: 89.6 fL (ref 80.0–100.0)
Monocytes Absolute: 0.5 10*3/uL (ref 0.1–1.0)
Monocytes Relative: 6 %
Neutro Abs: 5.1 10*3/uL (ref 1.7–7.7)
Neutrophils Relative %: 60 %
Platelets: 283 10*3/uL (ref 150–400)
RBC: 3.95 MIL/uL (ref 3.87–5.11)
RDW: 13.6 % (ref 11.5–15.5)
WBC: 8.5 10*3/uL (ref 4.0–10.5)
nRBC: 0 % (ref 0.0–0.2)

## 2019-11-19 LAB — BASIC METABOLIC PANEL
Anion gap: 6 (ref 5–15)
BUN: 24 mg/dL — ABNORMAL HIGH (ref 8–23)
CO2: 27 mmol/L (ref 22–32)
Calcium: 8.9 mg/dL (ref 8.9–10.3)
Chloride: 107 mmol/L (ref 98–111)
Creatinine, Ser: 1.23 mg/dL — ABNORMAL HIGH (ref 0.44–1.00)
GFR calc Af Amer: 53 mL/min — ABNORMAL LOW (ref >60)
GFR calc non Af Amer: 46 mL/min — ABNORMAL LOW (ref >60)
Glucose, Bld: 80 mg/dL (ref 70–99)
Potassium: 4.3 mmol/L (ref 3.5–5.1)
Sodium: 140 mmol/L (ref 135–145)

## 2019-11-19 MED ORDER — IRON SUCROSE 20 MG/ML IV SOLN
200.0000 mg | Freq: Once | INTRAVENOUS | Status: AC
  Administered 2019-11-19: 200 mg via INTRAVENOUS
  Filled 2019-11-19: qty 10

## 2019-11-19 MED ORDER — SODIUM CHLORIDE 0.9 % IV SOLN
Freq: Once | INTRAVENOUS | Status: AC
  Filled 2019-11-19: qty 250

## 2019-11-19 NOTE — Assessment & Plan Note (Addendum)
#  Mild worsening anemia-hemoglobin-hemoglobin 10.7./secondary to CKD-iron deficiency.  Proceed with IV iron today.  Hold off Aranesp at this time.  If worsens would recommend Aranesp.  #CKD stage III- no NSAIDs.[ Lasix q OD]; stable.  #Diabetes-hemoglobin A1c most recently 7.6; stable.  Continue follow-up with endocrinology.  DISPOSITION:  # IV venofer today. # in 4 months- MD; cbc/bmp/iron studies/ferritin- possible venofer- Dr.B

## 2019-11-19 NOTE — Progress Notes (Signed)
[facility] OFFICE PROGRESS NOTE  Patient Care Team: Revelo, Elyse Jarvis, MD as PCP - General (Family Medicine)   SUMMARY OF ONCOLOGIC HISTORY: #October 2019-MGUS IgA kappa 0.  2 g/dL [Dr.Balch; elevated ESR]; June 2020 repeat M protein-negative  #Anemia hemoglobin 9.9/CKD stage III  #  Anterior mesenteric nodule- 16x14m [sep 2016; smaller; Feb 2016- S/p Bx- leiomyoma.]; [place] LN [[0NU sep 22725 improving].2016- Octreotide scan- NEG;  CT SEP 2017- NED [elevated chromogranin  Levels- slightly]; July 2019- CT STABLE; Oct 2019- [facility]- imaging reviewed; No further surveillaince  # CKD stage III/diabetes; hemoglobin 10 [EGD-2016; colo-2018-Dr.Elliot]  # Retro-tracheal duplication cyst   INTERVAL HISTORY:   63year old female patient history of iron deficiency chronic kidney disease is here for follow-up.  Patient continues to complain of mild to moderate fatigue.  No nausea vomiting.  No abdominal pain.  Chronic joint pains.   Review of Systems  Constitutional: Positive for malaise/fatigue. Negative for chills, diaphoresis, fever and weight loss.  HENT: Negative for nosebleeds and sore throat.   Eyes: Negative for double vision.  Respiratory: Negative for cough, hemoptysis, sputum production, shortness of breath and wheezing.   Cardiovascular: Negative for chest pain, palpitations, orthopnea and leg swelling.  Gastrointestinal: Negative for abdominal pain, blood in stool, constipation, diarrhea, heartburn, melena, nausea and vomiting.  Genitourinary: Negative for dysuria, frequency and urgency.  Musculoskeletal: Positive for back pain and joint pain.  Skin: Negative.  Negative for itching and rash.  Neurological: Negative for dizziness, tingling, focal weakness, weakness and headaches.  Endo/Heme/Allergies: Does not bruise/bleed easily.  Psychiatric/Behavioral: Negative for depression. The patient is not nervous/anxious and does not have insomnia.      PAST  MEDICAL HISTORY :  Past Medical History:  Diagnosis Date  . Anemia   . Anemia of chronic kidney failure, stage 3 (moderate) 08/17/2019  . Arthritis   . BBB (bundle branch block)   . Bradycardia   . Coronary artery disease   . Diabetes mellitus without complication ([facility]   . DM type 2 with diabetic peripheral neuropathy ([facility] 09/19/2015  . Dysphagia   . Dysrhythmia   . GERD (gastroesophageal reflux disease)   . Headache   . History of hiatal hernia   . Hypertension   . Mesenteric mass   . Rotator cuff tendinitis, left   . Unspecified atrial fibrillation ([facility]     PAST SURGICAL HISTORY :   Past Surgical History:  Procedure Laterality Date  . ABDOMINAL HYSTERECTOMY    . CHOLECYSTECTOMY    . COLONOSCOPY WITH PROPOFOL N/A 12/20/2016   Procedure: COLONOSCOPY WITH PROPOFOL;  Surgeon: RManya Silvas MD;  [facility];  Service: Endoscopy;  Laterality: N/A;  . CORONARY ARTERY BYPASS GRAFT    . ESOPHAGOGASTRODUODENOSCOPY (EGD) WITH PROPOFOL N/A 04/27/2017   Procedure: ESOPHAGOGASTRODUODENOSCOPY (EGD) WITH PROPOFOL;  Surgeon: EManya Silvas MD;  [facility];  Service: Endoscopy;  Laterality: N/A;  . EUS N/A 04/24/2015   Procedure: ESOPHAGEAL ENDOSCOPIC ULTRASOUND (EUS) RADIAL;  Surgeon: MCora Daniels MD;  [facility];  Service: Endoscopy;  Laterality: N/A;  . THYROIDECTOMY, PARTIAL      FAMILY HISTORY :   Family History  Problem Relation Age of Onset  . Breast cancer Neg Hx     SOCIAL HISTORY:   Social History   Tobacco Use  . Smoking status: Never Smoker  . Smokeless tobacco: Never Used  Substance Use Topics  . Alcohol use: No    Alcohol/week: 0.0 standard drinks  .  Drug use: No    ALLERGIES:  is allergic to other; etodolac; iodinated diagnostic agents; and oxycodone-acetaminophen.  MEDICATIONS:  Current Outpatient Medications  Medication Sig Dispense Refill  . amLODipine (NORVASC) 5 MG tablet Take by mouth.    Marland Kitchen  BYDUREON BCISE 2 MG/0.85ML AUIJ     . clotrimazole (LOTRIMIN) 1 % cream Apply 1 application topically 2 (two) times daily.    . CONTOUR NEXT TEST test strip     . ferrous sulfate 325 (65 FE) MG tablet Take 325 mg by mouth daily with breakfast.    . FIASP 100 UNIT/ML SOLN     . fluticasone (FLONASE) 50 MCG/ACT nasal spray Place into the nose.    . furosemide (LASIX) 40 MG tablet Take 1 tablet by mouth daily.    Marland Kitchen gabapentin (NEURONTIN) 300 MG capsule Take 1 capsule by mouth daily as needed (nerve pain).     . Insulin Disposable Pump (OMNIPOD DASH 5 PACK PODS) MISC CHANGE POD Q 48 H UTD    . linaclotide (LINZESS) 72 MCG capsule Take 1 capsule by mouth daily.    . metFORMIN (GLUCOPHAGE-XR) 500 MG 24 hr tablet Take 500 mg by mouth daily.     . Multiple Vitamin (MULTI-VITAMIN) tablet Take 1 tablet by mouth daily.    . pravastatin (PRAVACHOL) 10 MG tablet Take 1 tablet by mouth daily.    . Rivaroxaban (XARELTO) 15 MG TABS tablet Take 15 mg by mouth 2 (two) times daily with a meal.    . spironolactone (ALDACTONE) 100 MG tablet Take 100 mg by mouth daily.     No current facility-administered medications for this visit.    PHYSICAL EXAMINATION: ECOG PERFORMANCE STATUS: 0 - Asymptomatic  BP (!) 156/78 (BP Location: Right Arm, Patient Position: Sitting)   Pulse 76   Resp 16   Wt 243 lb 12.8 oz (110.6 kg)   SpO2 100%   BMI 38.18 kg/m   Filed Weights   11/19/19 1418  Weight: 243 lb 12.8 oz (110.6 kg)    Physical Exam  Constitutional: She is oriented to person, place, and time and well-developed, well-nourished, and in no distress.  Patient is alone.  HENT:  Head: Normocephalic and atraumatic.  Mouth/Throat: Oropharynx is clear and moist. No oropharyngeal exudate.  Eyes: Pupils are equal, round, and reactive to light.  Cardiovascular: Normal rate and regular rhythm.  Pulmonary/Chest: No respiratory distress. She has no wheezes.  Abdominal: Soft. Bowel sounds are normal. She exhibits no  distension and no mass. There is no abdominal tenderness. There is no rebound and no guarding.  Musculoskeletal:        General: No tenderness or edema. Normal range of motion.     Cervical back: Normal range of motion and neck supple.  Neurological: She is alert and oriented to person, place, and time.  Skin: Skin is warm.  Psychiatric: Affect normal.     LABORATORY DATA:  I have reviewed the data as listed    Component Value Date/Time   NA 140 11/19/2019 1355   NA 136 03/10/2015 1640   K 4.3 11/19/2019 1355   K 2.7 (L) 03/10/2015 1640   CL 107 11/19/2019 1355   CL 96 (L) 03/10/2015 1640   CO2 27 11/19/2019 1355   CO2 31 03/10/2015 1640   GLUCOSE 80 11/19/2019 1355   GLUCOSE 99 03/10/2015 1640   BUN 24 (H) 11/19/2019 1355   BUN 16 03/10/2015 1640   CREATININE 1.23 (H) 11/19/2019 1355   CREATININE  1.18 (H) 03/10/2015 1640   CALCIUM 8.9 11/19/2019 1355   CALCIUM 9.0 03/10/2015 1640   PROT 7.6 05/11/2019 1405   PROT 7.7 03/10/2015 1640   ALBUMIN 4.0 05/11/2019 1405   ALBUMIN 4.1 03/10/2015 1640   AST 16 05/11/2019 1405   AST 34 03/10/2015 1640   ALT 14 05/11/2019 1405   ALT 39 03/10/2015 1640   ALKPHOS 80 05/11/2019 1405   ALKPHOS 60 03/10/2015 1640   BILITOT 0.4 05/11/2019 1405   BILITOT 0.4 03/10/2015 1640   GFRNONAA 46 (L) 11/19/2019 1355   GFRNONAA 50 (L) 03/10/2015 1640   GFRAA 53 (L) 11/19/2019 1355   GFRAA 58 (L) 03/10/2015 1640    No results found for: SPEP, UPEP  Lab Results  Component Value Date   WBC 8.5 11/19/2019   NEUTROABS 5.1 11/19/2019   HGB 10.7 (L) 11/19/2019   HCT 35.4 (L) 11/19/2019   MCV 89.6 11/19/2019   PLT 283 11/19/2019      Chemistry      Component Value Date/Time   NA 140 11/19/2019 1355   NA 136 03/10/2015 1640   K 4.3 11/19/2019 1355   K 2.7 (L) 03/10/2015 1640   CL 107 11/19/2019 1355   CL 96 (L) 03/10/2015 1640   CO2 27 11/19/2019 1355   CO2 31 03/10/2015 1640   BUN 24 (H) 11/19/2019 1355   BUN 16 03/10/2015 1640    CREATININE 1.23 (H) 11/19/2019 1355   CREATININE 1.18 (H) 03/10/2015 1640      Component Value Date/Time   CALCIUM 8.9 11/19/2019 1355   CALCIUM 9.0 03/10/2015 1640   ALKPHOS 80 05/11/2019 1405   ALKPHOS 60 03/10/2015 1640   AST 16 05/11/2019 1405   AST 34 03/10/2015 1640   ALT 14 05/11/2019 1405   ALT 39 03/10/2015 1640   BILITOT 0.4 05/11/2019 1405   BILITOT 0.4 03/10/2015 1640      ASSESSMENT & PLAN:   Anemia of chronic kidney failure, stage 3 (moderate) (HCC) #Mild worsening anemia-hemoglobin-hemoglobin 10.7./secondary to CKD-iron deficiency.  Proceed with IV iron today.  Hold off Aranesp at this time.  If worsens would recommend Aranesp.  #CKD stage III- no NSAIDs.[ Lasix q OD]; stable.  #Diabetes-hemoglobin A1c most recently 7.6; stable.  Continue follow-up with endocrinology.  DISPOSITION:  # IV venofer today. # in 4 months- MD; cbc/bmp/iron studies/ferritin- possible venofer- Dr.B      Cammie Sickle, MD 11/20/2019 7:39 AM

## 2019-11-29 DIAGNOSIS — G8929 Other chronic pain: Secondary | ICD-10-CM | POA: Insufficient documentation

## 2019-12-12 ENCOUNTER — Other Ambulatory Visit: Payer: Self-pay | Admitting: Family Medicine

## 2019-12-12 ENCOUNTER — Other Ambulatory Visit: Payer: Medicare HMO

## 2019-12-12 DIAGNOSIS — Z1231 Encounter for screening mammogram for malignant neoplasm of breast: Secondary | ICD-10-CM

## 2019-12-13 ENCOUNTER — Other Ambulatory Visit: Payer: Medicare HMO

## 2019-12-24 ENCOUNTER — Ambulatory Visit
Admission: RE | Admit: 2019-12-24 | Discharge: 2019-12-24 | Disposition: A | Discharge status: Home / Self Care | Payer: Medicare HMO | Source: Ambulatory Visit | Attending: Family Medicine | Admitting: Family Medicine

## 2019-12-24 DIAGNOSIS — Z1231 Encounter for screening mammogram for malignant neoplasm of breast: Secondary | ICD-10-CM | POA: Diagnosis present

## 2019-12-28 ENCOUNTER — Telehealth: Payer: Self-pay | Admitting: General Surgery

## 2019-12-28 NOTE — Telephone Encounter (Signed)
Pt has been advised of pre admission date/time, Covid Testing date and Surgery date.  Surgery Date: 01/09/20 Preadmission Testing Date: 02/812/21 (8a-1p) Covid Testing Date: 01/07/20 - patient advised to go to the [facility] ([place])  Patient has been made aware to call [phone], between 1-3:00pm the day before surgery, to find out what time to arrive.

## 2020-01-02 ENCOUNTER — Telehealth: Payer: Self-pay | Admitting: General Surgery

## 2020-01-02 NOTE — Telephone Encounter (Signed)
Outbound call made to pt requesting a call back to advise of pre admission date/time, Covid Testing date and Surgery date.  Surgery Date: 01/30/20 Preadmission Testing Date: 01/25/20 (8a-1p) Covid Testing Date: 01/28/20 - patient advised to go to the [place] ([place])  Please make patient aware to call [phone], between 1-3:00pm the day before surgery, to find out what time to arrive.

## 2020-01-04 ENCOUNTER — Other Ambulatory Visit: Payer: Medicare HMO

## 2020-01-07 ENCOUNTER — Encounter: Payer: Self-pay | Admitting: Internal Medicine

## 2020-01-07 ENCOUNTER — Other Ambulatory Visit: Payer: Medicare HMO

## 2020-01-07 ENCOUNTER — Telehealth: Payer: Self-pay | Admitting: Nurse Practitioner

## 2020-01-07 NOTE — Telephone Encounter (Signed)
Called patient to follow-up regarding her MyChart message complaining of fatigue.  She said that her husband tested positive for COVID-19 infection on February 5.  Today is his last day of quarantine.  She personally was tested on February 11 and had a negative test result at that time.  She says that she has been feeling fatigued since December but it is worse.  She is scheduled for thyroidectomy next month.  No recent blood work.  History of iron deficiency anemia secondary to chronic kidney disease. I recommended repeat Covid test today and if negative result we could see her in clinic and plan to check labs at that time.  I provided patient with information regarding how to schedule her Covid test.  She will call back later this week.

## 2020-01-23 ENCOUNTER — Other Ambulatory Visit: Payer: Self-pay | Admitting: General Surgery

## 2020-01-23 DIAGNOSIS — E042 Nontoxic multinodular goiter: Secondary | ICD-10-CM

## 2020-01-25 ENCOUNTER — Other Ambulatory Visit: Payer: Self-pay

## 2020-01-25 ENCOUNTER — Telehealth: Payer: Self-pay | Admitting: General Surgery

## 2020-01-25 ENCOUNTER — Encounter
Admission: RE | Admit: 2020-01-25 | Discharge: 2020-01-25 | Disposition: A | Discharge status: Home / Self Care | Payer: Medicare HMO | Source: Ambulatory Visit | Attending: General Surgery | Admitting: General Surgery

## 2020-01-25 DIAGNOSIS — Z01812 Encounter for preprocedural laboratory examination: Secondary | ICD-10-CM | POA: Insufficient documentation

## 2020-01-25 HISTORY — DX: Irritable bowel syndrome, unspecified: K58.9

## 2020-01-25 HISTORY — DX: Personal history of other infectious and parasitic diseases: Z86.19

## 2020-01-25 HISTORY — DX: Dyspnea, unspecified: R06.00

## 2020-01-25 NOTE — Pre-Procedure Instructions (Addendum)
Sent a request to Dr. Nehemiah Massed to tell us when to stop Xarelto prior to surgery.  There is a clearance on her chart from December 2020. The surgery had been rescheduled several times.  Kelly from Dr. Nehemiah Massed called back to say that he did not want to use that clearance because the patient needs several cardiac tests before he can give clearance.  She is to return to see him February 29, 2020 after the ordered testing is completed. Patient needs to have an echo, nuc med visit and carotid studies.  The office contacted the patient to give her this info.  I will contact Dr. Stacy [name]' office.   I spoke with Almyra Free at Dr. Stacy [name]' office and explained that the surgery was to be cancelled until clearance received from  Dr. Nehemiah Massed.  She will pass the info onto the surgery scheduler.

## 2020-01-25 NOTE — Patient Instructions (Addendum)
INSTRUCTIONS FOR SURGERY     Your surgery is scheduled for:   Wednesday, MARCH 10TH     To find out your arrival time for the day of surgery,          please call [phone] between 1 pm and 3 pm on :   Tuesday, MARCH 9TH     When you arrive for surgery, report to the [facility].       Do NOT stop on the first floor to register.    REMEMBER: Instructions that are not followed completely may result in serious medical risk,  up to and including death, or upon the discretion of your surgeon and anesthesiologist,            your surgery may need to be rescheduled.  __X__ 1. Do not eat food after midnight the night before your procedure.                    No gum, candy, lozenger, tic tacs, tums or hard candies.                  ABSOLUTELY NOTHING SOLID IN YOUR MOUTH AFTER MIDNIGHT                    You may drink unlimited clear liquids up to 2 hours before you are scheduled to arrive for surgery.                   Do not drink anything within those 2 hours unless you need to take medicine, then take the                   smallest amount you need.  Clear liquids include:  water, apple juice without pulp,                   any flavor Gatorade, Black coffee, black tea.  Sugar may be added but no dairy/ honey /lemon.                        Broth and jello is not considered a clear liquid.  __x__  2. On the morning of surgery, please brush your teeth with toothpaste and water. You may rinse with                  mouthwash if you wish but DO NOT SWALLOW TOOTHPASTE OR MOUTHWASH  __X___3. NO alcohol for 24 hours before or after surgery.  __x___ 4.  Do NOT smoke or use e-cigarettes for 24 HOURS PRIOR TO SURGERY.                      DO NOT Use any chewable tobacco products for at least 6 hours prior to surgery.  __x___ 5. If you start any new medication after this appointment and prior to surgery, please       Bring it with you on the day of surgery.  ___x__ 6. Notify your doctor if there is any change in your medical condition, such as fever,  infection, vomitting, diarrhea or any open sores.  __x___ 7.  USE the CHG SOAP as instructed, the night before surgery and the day of surgery.                   Once you have washed with this soap, do NOT use any of the following: Powders, perfumes                    or lotions. Please do not wear make up, hairpins, clips or nail polish. You may wear deodorant.                   Men may shave their face and neck.  Women need to shave 48 hours prior to surgery.                   DO NOT wear ANY jewelry on the day of surgery. If there are rings that are too tight to                    remove easily, please address this prior to the surgery day. Piercings need to be removed.                                                                     NO METAL ON YOUR BODY.                    Do NOT bring any valuables.  If you came to Pre-Admit testing then you will not need license,                     insurance card or credit card.  If you will be staying overnight, please either leave your things in                     the car or have your family be responsible for these items.                     [facility] IS NOT RESPONSIBLE FOR BELONGINGS OR VALUABLES.  ___X__ 8. DO NOT wear contact lenses on surgery day.  You may not have dentures,                     Hearing aides, contacts or glasses in the operating room. These items can be                    Placed in the Recovery Room to receive immediately after surgery.  __x___ 9. IF YOU ARE SCHEDULED TO GO HOME ON THE SAME DAY, YOU MUST                   Have someone to drive you home and to stay with you  for the first 24 hours.                    Have an arrangement prior to arriving on surgery day.  ___x__ 10. Take the following medications on the morning of surgery with a sip of water:  1. AMLODIPINE                     2. FLONASE NASAL SPRAY                     3. GABAPENTIN                     4. OMEPRAZOLE/PRILOSEC                     5. BETAPACE                     6.  _____ 11.  Follow any instructions provided to you by your surgeon.                        Such as enema, clear liquid bowel prep  __X__  12. STOP XARELTO ACCORDING TO DR. Nehemiah Massed                       THIS INCLUDES BC POWDERS / GOODIES POWDER  __x___ 13. STOP Anti-inflammatories as of: Friday, MARCH 5TH                      This includes IBUPROFEN / MOTRIN / ADVIL / ALEVE/ NAPROXYN                    YOU MAY TAKE TYLENOL ANY TIME PRIOR TO SURGERY.  __X___ 60.  Stop supplements until after surgery.                     This includes:  MAGNESIUM // MULTIVITAMINS // VITRON-C   __X___16.  Stop Metformin 2 full days prior to surgery.  Stop AFTER EVENING DOSE                                  ON Sunday, MARCH 7TH                     MAINTAIN YOUR INSULIN PUMP ACCORDING TO YOUR BLOOD GLUCOSE LEVELS.                     Do NOT take any diabetes medications on surgery day.  ___X__17.  Continue to take the following medications but do not take on the morning of surgery:                       LASIX // Rolan Lipa // ALDACTONE // MICARDIS  __X____18. If staying overnight, please have appropriate shoes to wear to be able to walk around the unit.                   Wear clean and comfortable clothing to the hospital.  IF possible, please bring a copy of your Medical Advance Directives, ie Living Will & POA papers.  We will make a copy for your chart.  Bring any phone numbers for contact persons.

## 2020-01-25 NOTE — Telephone Encounter (Signed)
Hailey Howard w/the pre-admission dept called to provide the following information:   ~ Pre-admit phone interview made today followed by her contacting Dr. Alveria Apley office regarding her cardiac clearance   ~ Dr. Nehemiah Massed is ordering more tests to be run, as the pt has complained of more issues w/SOB & will f/u w/her on 02/29/20   ~ Cardiac clearance is being pulled @ this time   A message has been sent to Dr. Celine Ahr advising of the status change in the case & to ask if she would like to follow up w/the pt prior to rescheduling surgery but after 02/29/20.

## 2020-01-25 NOTE — Telephone Encounter (Signed)
Outbound call made to pt; left message requesting a call back to explain changed made to scheduled surgery & request to schedule a follow up appt w/Dr. Celine Ahr after 02/29/20.

## 2020-01-25 NOTE — Pre-Procedure Instructions (Signed)
Spoke with patient via phone regarding her upcoming surgery. She is to contact Dr. Nehemiah Massed today to find out when to stop her Xarelto. Clearance request form also faxed to his office to ask the same info.  Cardiac clearance already obtained.

## 2020-01-28 ENCOUNTER — Other Ambulatory Visit: Admission: RE | Admit: 2020-01-28 | Payer: Medicare HMO | Source: Ambulatory Visit

## 2020-01-29 ENCOUNTER — Other Ambulatory Visit: Payer: Self-pay

## 2020-01-29 ENCOUNTER — Encounter: Payer: Self-pay | Admitting: *Deleted

## 2020-01-29 ENCOUNTER — Emergency Department: Payer: Medicare HMO

## 2020-01-29 ENCOUNTER — Emergency Department
Admission: EM | Admit: 2020-01-29 | Discharge: 2020-01-29 | Disposition: A | Discharge status: Home / Self Care | Payer: Medicare HMO | Attending: Emergency Medicine | Admitting: Emergency Medicine

## 2020-01-29 DIAGNOSIS — Z794 Long term (current) use of insulin: Secondary | ICD-10-CM | POA: Diagnosis not present

## 2020-01-29 DIAGNOSIS — I129 Hypertensive chronic kidney disease with stage 1 through stage 4 chronic kidney disease, or unspecified chronic kidney disease: Secondary | ICD-10-CM | POA: Diagnosis not present

## 2020-01-29 DIAGNOSIS — E1122 Type 2 diabetes mellitus with diabetic chronic kidney disease: Secondary | ICD-10-CM | POA: Insufficient documentation

## 2020-01-29 DIAGNOSIS — N183 Chronic kidney disease, stage 3 unspecified: Secondary | ICD-10-CM | POA: Diagnosis not present

## 2020-01-29 DIAGNOSIS — R0602 Shortness of breath: Secondary | ICD-10-CM | POA: Diagnosis present

## 2020-01-29 DIAGNOSIS — R0789 Other chest pain: Secondary | ICD-10-CM | POA: Diagnosis not present

## 2020-01-29 DIAGNOSIS — Z79899 Other long term (current) drug therapy: Secondary | ICD-10-CM | POA: Insufficient documentation

## 2020-01-29 DIAGNOSIS — I2510 Atherosclerotic heart disease of native coronary artery without angina pectoris: Secondary | ICD-10-CM | POA: Insufficient documentation

## 2020-01-29 DIAGNOSIS — R0601 Orthopnea: Secondary | ICD-10-CM

## 2020-01-29 LAB — BASIC METABOLIC PANEL
Anion gap: 5 (ref 5–15)
BUN: 35 mg/dL — ABNORMAL HIGH (ref 8–23)
CO2: 26 mmol/L (ref 22–32)
Calcium: 9.1 mg/dL (ref 8.9–10.3)
Chloride: 108 mmol/L (ref 98–111)
Creatinine, Ser: 1.58 mg/dL — ABNORMAL HIGH (ref 0.44–1.00)
GFR calc Af Amer: 39 mL/min — ABNORMAL LOW (ref >60)
GFR calc non Af Amer: 34 mL/min — ABNORMAL LOW (ref >60)
Glucose, Bld: 108 mg/dL — ABNORMAL HIGH (ref 70–99)
Potassium: 4.5 mmol/L (ref 3.5–5.1)
Sodium: 139 mmol/L (ref 135–145)

## 2020-01-29 LAB — HEPATIC FUNCTION PANEL
ALT: 15 U/L (ref 0–44)
AST: 17 U/L (ref 15–41)
Albumin: 3.6 g/dL (ref 3.5–5.0)
Alkaline Phosphatase: 75 U/L (ref 38–126)
Bilirubin, Direct: <0.1 mg/dL (ref 0.0–0.2)
Total Bilirubin: 0.4 mg/dL (ref 0.3–1.2)
Total Protein: 7.2 g/dL (ref 6.5–8.1)

## 2020-01-29 LAB — CBC
HCT: 31.4 % — ABNORMAL LOW (ref 36.0–46.0)
Hemoglobin: 9.8 g/dL — ABNORMAL LOW (ref 12.0–15.0)
MCH: 27.8 pg (ref 26.0–34.0)
MCHC: 31.2 g/dL (ref 30.0–36.0)
MCV: 89 fL (ref 80.0–100.0)
Platelets: 236 10*3/uL (ref 150–400)
RBC: 3.53 MIL/uL — ABNORMAL LOW (ref 3.87–5.11)
RDW: 14.4 % (ref 11.5–15.5)
WBC: 6.8 10*3/uL (ref 4.0–10.5)
nRBC: 0 % (ref 0.0–0.2)

## 2020-01-29 LAB — BRAIN NATRIURETIC PEPTIDE: B Natriuretic Peptide: 53 pg/mL (ref 0.0–100.0)

## 2020-01-29 LAB — TROPONIN I (HIGH SENSITIVITY)
Troponin I (High Sensitivity): 5 ng/L (ref <18)
Troponin I (High Sensitivity): 5 ng/L (ref <18)

## 2020-01-29 LAB — GLUCOSE, CAPILLARY: Glucose-Capillary: 118 mg/dL — ABNORMAL HIGH (ref 70–99)

## 2020-01-29 MED ORDER — SODIUM CHLORIDE 0.9% FLUSH: 3.0000 mL | Freq: Once | INTRAVENOUS | Status: DC

## 2020-01-29 NOTE — ED Notes (Signed)
2nd troponin obtained, x2 attempts by student RN, +1 attempt by this RN via US.pt is difficult stick. Sample obtained with [place] but unable to place IV.

## 2020-01-29 NOTE — ED Triage Notes (Signed)
Pt reports chest and sob since last week. Husband dx with covid feb 2021.  Pt had 2nd vaccine last week.  No cough.  Nonsmoker.  No n/v/d.  Pt alert  Speech clear.

## 2020-01-29 NOTE — ED Notes (Signed)
Pt ambulated in room with cont pulse ox. Pt states no increase in SOB with ambulation, SOB is worse with lying flat. Sat remained 100% for duration of exercise.

## 2020-01-29 NOTE — Discharge Instructions (Addendum)
Try taking your furosemide (lasix) every day for 3 days in a row, to see if it helps your shortness of breath and chest pressure  Call Dr. Alveria Apley office to notify him of your Er visit and see if he wants to move up his stress testing

## 2020-01-29 NOTE — ED Provider Notes (Signed)
[facility] Emergency Department Provider Note  ____________________________________________   First MD Initiated Contact with Patient 01/29/20 1947     (approximate)  I have reviewed the triage vital signs and the nursing notes.   HISTORY  Chief Complaint Chest Pain and Shortness of Breath    HPI Hailey Howard is a 64 y.o. female  With h/o CAD, DM, HTN, here with weakness. Pt reports that she is here for 3-4 weeks of generalized fatigue, weakness, mild SOB with exertion. She reports that over the past few weeks, she has been more fatigued and tired than usual. She attributed this to her anemia, and she is currently undergoing IV iron infusions and is due for one. She states that she has also had some difficulty sleeping and swallowing 2/2 a thyroid nodule, which she was scheduled to have surgically removed tomorrow but needs cards clearance. Reports some associated increased swelling in her legs, mild DOE and orthopnea. She occasionally feels a mild chest pressure but this is not constant and is not worsening. No recent med changes. No fever, chills.       Past Medical History:  Diagnosis Date  . Anemia    iron anemia, now getting infusions  . Anemia of chronic kidney failure, stage 3 (moderate) 08/17/2019  . Arthritis   . BBB (bundle branch block)   . Bradycardia   . Coronary artery disease   . Diabetes mellitus without complication ([place])   . 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 Dysphagia   . Dyspnea    recent complaints of sob  . Dysrhythmia   . GERD (gastroesophageal reflux disease)   . Headache   . History of hiatal hernia   . History of shingles   . Hypertension   . IBS (irritable bowel syndrome)   . Mesenteric mass   . Rotator cuff tendinitis, left   . Unspecified atrial fibrillation [facility])     Patient Active Problem List   Diagnosis Date Noted  . Anemia of chronic kidney failure, stage 3 (moderate)  08/17/2019  . Coronary artery disease 08/02/2016  . Hyperlipidemia 08/02/2016  . Hypertension 08/02/2016  . Tachycardia 08/02/2016  . Mesenteric mass 08/02/2016  . Bursitis of left shoulder 05/07/2016  . DM type 2 with diabetic peripheral neuropathy ([place]) 09/19/2015  . Edema 06/16/2015  . Atrial fibrillation ([place]) 05/01/2014    Past Surgical History:  Procedure Laterality Date  . ABDOMINAL HYSTERECTOMY    . CHOLECYSTECTOMY    . COLONOSCOPY WITH PROPOFOL N/A 12/20/2016   Procedure: COLONOSCOPY WITH PROPOFOL;  Surgeon: Manya Silvas, MD;  Location: [facility] ENDOSCOPY;  Service: Endoscopy;  Laterality: N/A;  . CORONARY ANGIOPLASTY    . CORONARY ARTERY BYPASS GRAFT  2002  . ESOPHAGOGASTRODUODENOSCOPY (EGD) WITH PROPOFOL N/A 04/27/2017   Procedure: ESOPHAGOGASTRODUODENOSCOPY (EGD) WITH PROPOFOL;  Surgeon: Manya Silvas, MD;  Location: [facility] ENDOSCOPY;  Service: Endoscopy;  Laterality: N/A;  . EUS N/A 04/24/2015   Procedure: ESOPHAGEAL ENDOSCOPIC ULTRASOUND (EUS) RADIAL;  Surgeon: Cora Daniels, MD;  Location: [facility] ENDOSCOPY;  Service: Endoscopy;  Laterality: N/A;  . THYROIDECTOMY, PARTIAL      Prior to Admission medications   Medication Sig Start Date End Date Taking? Authorizing Provider  amLODipine (NORVASC) 5 MG tablet Take 5 mg by mouth daily.  10/22/19   [provider]  BYDUREON BCISE 2 MG/0.85ML AUIJ Inject 2 mg into the skin every 7 (seven) days.  09/03/19   [provider]  clotrimazole (LOTRIMIN) 1 % cream Apply 1 application topically 2 (two) times daily as needed (irritation).     [provider]  CONTOUR NEXT TEST test strip  09/26/19   [provider]  FIASP 100 UNIT/ML SOLN Inject 70 Units into the skin continuous. Via Insulin pump 10/17/19   [provider]  fluticasone (FLONASE) 50 MCG/ACT nasal spray Place 2 sprays into both nostrils daily as needed for allergies.     [provider]  furosemide (LASIX) 40 MG  tablet Take 40 mg by mouth every other day.  01/03/14   [provider]  gabapentin (NEURONTIN) 300 MG capsule Take 300 mg by mouth daily as needed (nerve pain).     [provider]  Insulin Disposable Pump (OMNIPOD DASH 5 PACK PODS) MISC CHANGE POD Q 48 H UTD 12/19/18   [provider]  Iron-Vitamin C (VITRON-C PO) Take 1 tablet by mouth daily.    [provider]  linaclotide (LINZESS) 72 MCG capsule Take 72 mcg by mouth every other day. Has not taken in a few months 03/30/18   [provider]  Magnesium 250 MG TABS Take 250 mg by mouth daily.    [provider]  metFORMIN (GLUCOPHAGE-XR) 500 MG 24 hr tablet Take 500-1,000 mg by mouth See admin instructions. Take 500 mg by mouth in the morning and 1000 mg at night    [provider]  Multiple Vitamin (MULTI-VITAMIN) tablet Take 1 tablet by mouth daily.    [provider]  omeprazole (PRILOSEC) 20 MG capsule Take 20 mg by mouth daily.    [provider]  pravastatin (PRAVACHOL) 10 MG tablet Take 10 mg by mouth daily.  04/13/19   [provider]  Rivaroxaban (XARELTO) 15 MG TABS tablet Take 15 mg by mouth daily with supper.     [provider]  sotalol (BETAPACE) 80 MG tablet Take 80 mg by mouth 2 (two) times daily.    [provider]  spironolactone (ALDACTONE) 50 MG tablet Take 50 mg by mouth daily.     [provider]  telmisartan (MICARDIS) 80 MG tablet Take 80 mg by mouth daily.    [provider]    Allergies Other, Iodinated diagnostic agents, Oxycodone-acetaminophen, and Etodolac  Family History  Problem Relation Age of Onset  . Breast cancer Neg Hx     Social History Social History   Tobacco Use  . Smoking status: Never Smoker  . Smokeless tobacco: Never Used  Substance Use Topics  . Alcohol use: No    Alcohol/week: 0.0 standard drinks  . Drug use: No    Review of Systems  Review of Systems    Constitutional: Positive for fatigue. Negative for fever.  HENT: Negative for congestion and sore throat.   Eyes: Negative for visual disturbance.  Respiratory: Positive for shortness of breath. Negative for cough.   Cardiovascular: Negative for chest pain.  Gastrointestinal: Negative for abdominal pain, diarrhea, nausea and vomiting.  Genitourinary: Negative for flank pain.  Musculoskeletal: Negative for back pain and neck pain.  Skin: Negative for rash and wound.  Neurological: Positive for weakness.  All other systems reviewed and are negative.    ____________________________________________  PHYSICAL EXAM:      VITAL SIGNS: ED Triage Vitals [01/29/20 1753]  Enc Vitals Group     BP (!) 141/87     Pulse Rate (!) 50     Resp 20     Temp 98.6 F (37 C)  Temp Source Oral     SpO2 97 %     Weight 242 lb (109.8 kg)     Height 5\' 7"  (1.702 m)     Head Circumference      Peak Flow      Pain Score 8     Pain Loc      Pain Edu?      Excl. in [place]?      Physical Exam Vitals and nursing note reviewed.  Constitutional:      General: She is not in acute distress.    Appearance: She is well-developed.  HENT:     Head: Normocephalic and atraumatic.  Eyes:     Conjunctiva/sclera: Conjunctivae normal.  Neck:     Thyroid: Thyromegaly present.  Cardiovascular:     Rate and Rhythm: Regular rhythm. Bradycardia present.     Heart sounds: Normal heart sounds. No murmur. No friction rub.  Pulmonary:     Effort: Pulmonary effort is normal. No respiratory distress.     Breath sounds: Normal breath sounds. No wheezing or rales.  Abdominal:     General: There is no distension.     Palpations: Abdomen is soft.     Tenderness: There is no abdominal tenderness.  Musculoskeletal:     Cervical back: Neck supple.     Right lower leg: Edema present.     Left lower leg: Edema present.  Skin:    General: Skin is warm.     Capillary Refill: Capillary refill takes less than 2 seconds.   Neurological:     Mental Status: She is alert and oriented to person, place, and time.     Motor: No abnormal muscle tone.       ____________________________________________   LABS (all labs ordered are listed, but only abnormal results are displayed)  Labs Reviewed  BASIC METABOLIC PANEL - Abnormal; Notable for the following components:      Result Value   Glucose, Bld 108 (*)    BUN 35 (*)    Creatinine, Ser 1.58 (*)    GFR calc non Af Amer 34 (*)    GFR calc Af Amer 39 (*)    All other components within normal limits  CBC - Abnormal; Notable for the following components:   RBC 3.53 (*)    Hemoglobin 9.8 (*)    HCT 31.4 (*)    All other components within normal limits  GLUCOSE, CAPILLARY - Abnormal; Notable for the following components:   Glucose-Capillary 118 (*)    All other components within normal limits  BRAIN NATRIURETIC PEPTIDE  HEPATIC FUNCTION PANEL  TROPONIN I (HIGH SENSITIVITY)  TROPONIN I (HIGH SENSITIVITY)    ____________________________________________  EKG: Normal sinus rhythm, VR 80. PR 192, QRS 128, QTc 495. PACs. No acute ST elevations. ________________________________________  RADIOLOGY All imaging, including plain films, CT scans, and ultrasounds, independently reviewed by me, and interpretations confirmed via formal radiology reads.  ED MD interpretation:   CXR: Clear, no acute abnormality  Official radiology report(s): DG Chest 2 View  Result Date: 01/29/2020 CLINICAL DATA:  Chest pain and shortness of breath for 1 week. EXAM: CHEST - 2 VIEW COMPARISON:  03/06/2019 FINDINGS: The heart size and mediastinal contours are within normal limits. Prior CABG again noted. Both lungs are clear. The visualized skeletal structures are unremarkable. IMPRESSION: Stable exam.  No active cardiopulmonary disease. Electronically Signed   By: Marlaine Hind M.D.   On: 01/29/2020 18:23     ____________________________________________  PROCEDURES  Procedure(s) performed (including Critical Care):  Procedures  ____________________________________________  INITIAL IMPRESSION / MDM / ASSESSMENT AND PLAN / ED COURSE  As part of my medical decision making, I reviewed the following data within the [facility] notes reviewed and incorporated, Old chart reviewed, Notes from prior ED visits, and  Controlled Substance Database       *KEATYN LUCK was evaluated in Emergency Department on 01/30/2020 for the symptoms described in the history of present illness. She was evaluated in the context of the global COVID-19 pandemic, which necessitated consideration that the patient might be at risk for infection with the SARS-CoV-2 virus that causes COVID-19. Institutional protocols and algorithms that pertain to the evaluation of patients at risk for COVID-19 are in a state of rapid change based on information released by regulatory bodies including the CDC and federal and state organizations. These policies and algorithms were followed during the patient's care in the ED.  Some ED evaluations and interventions may be delayed as a result of limited staffing during the pandemic.*     Medical Decision Making:  64 yo F here with mild SOB with exertion, occasional chest pressure now resolved. Based on review of records, this seems to be an ongoing, chronic issue for which she has a stress test and work-up scheduled in the next few weeks. Tonight, her EKG is non-ischemic and she has negative troponin x 2 despite constant sx, suggesting no acute ischemia. She does have trace edema b/l with reports of orthopnea, so there could be a component of symptomatic CHF. Otherwise, Cr slightly above baseline but labs are o/w unremarkable. She has a h/o thyroid disease but just had normal thyroid studies and is scheduled for surgery in the near future.  Discussed reassuring  labs, imaging with patient. Given that she already has close cardiology follow-up arrange, feel its reasonable to d/c. Will trial taking her lasix daily x 3 days rather than every other day to see if this helps some of her sx, and encouraged low sodium diet and monitoring her BPs. Return precautions given.  ____________________________________________  FINAL CLINICAL IMPRESSION(S) / ED DIAGNOSES  Final diagnoses:  Orthopnea  Atypical chest pain     MEDICATIONS GIVEN DURING THIS VISIT:  Medications - No data to display   ED Discharge Orders    None       Note:  This document was prepared using Dragon voice recognition software and may include unintentional dictation errors.   Duffy Bruce, MD 01/30/20 [phone]

## 2020-01-30 ENCOUNTER — Encounter: Admission: RE | Payer: Self-pay | Source: Home / Self Care

## 2020-01-30 ENCOUNTER — Ambulatory Visit: Admission: RE | Admit: 2020-01-30 | Payer: Medicare HMO | Source: Home / Self Care | Admitting: General Surgery

## 2020-01-30 SURGERY — THYROIDECTOMY, COMPLETION
Anesthesia: General

## 2020-03-04 ENCOUNTER — Encounter: Payer: Self-pay | Admitting: General Surgery

## 2020-03-04 ENCOUNTER — Telehealth: Payer: Self-pay

## 2020-03-04 ENCOUNTER — Other Ambulatory Visit: Payer: Self-pay

## 2020-03-04 ENCOUNTER — Ambulatory Visit: Payer: Medicare HMO | Admitting: General Surgery

## 2020-03-04 VITALS — BP 132/75 | HR 72 | Temp 92.3°F | Ht 67.0 in | Wt 238.4 lb

## 2020-03-04 DIAGNOSIS — E042 Nontoxic multinodular goiter: Secondary | ICD-10-CM | POA: Insufficient documentation

## 2020-03-04 NOTE — Progress Notes (Signed)
Patient ID: Hailey Howard, female   DOB: 1956-08-05, 64 y.o.   MRN: [id number]  Chief Complaint  Patient presents with  . Follow-up    Re-evaluate prior to thyroidectomy w/Dr. Celine Ahr    HPI Hailey Howard is a 64 y.o. female.  I first saw her in December 2020.  I have copied my initial HPI here:  "She has a history of prior partial thyroid resection in 2010.  She thinks this occurred on the right but we do not have access to those records.  Recent thyroid ultrasound suggests that the prior resection was quite limited, as she has essentially a full thyroid, by volume, on both sides.  There is a dominant nodule on the right that measures 5.82 cm in greatest dimension.  This was biopsied in September 2020 and was benign.  Due to compressive symptoms, however, Hailey Howard is interested in completion thyroidectomy.  She endorses dysphagia to both solids and liquids.  She has pressure in her neck while in the supine position.  She describes frequent throat clearing.  There has been no change in her voice.  No heat or cold intolerance.  She has occasional heart palpitations and sometimes feels jittery or anxious.  She describes alopecia, but no change in the texture of her skin or nails.  She does have intermittent constipation and diarrhea but ascribes this to irritable bowel syndrome.  She endorses fatigue.  She denies any significant weight loss or weight gain.  No ocular symptoms.  She currently does not take any thyroid hormone replacement.  She has no family history of thyroid problems or thyroid cancer.  She denies any occupational or therapeutic exposure to head and neck irradiation."  She was scheduled for completion thyroidectomy, however she developed worsening dyspnea and her cardiologist withdrew clearance.  She has undergone additional evaluation with him including a myocardial perfusion scan and echocardiogram.  Although there are no notes available for my review, these do appear to  indicate that she will likely receive clearance to proceed with her operation..  She is here today for reevaluation, since it has been several months since I last saw her.  Today, she continues to endorse dysphagia to both solids and liquids.  She often feels like she is going to choke.  She endorses frequent throat clearing.  She has pressure in her neck while supine.  She denies heat or cold intolerance.  No heart palpitations or hand tremors.  No changes to the texture of her hair, skin, or fingernails.  Her weight has been stable.  She is eager to proceed with thyroidectomy.  She states that she is no longer taking Xarelto; her cardiologist has her on a daily baby aspirin instead.    Past Medical History:  Diagnosis Date  . Anemia    iron anemia, now getting infusions  . Anemia of chronic kidney failure, stage 3 (moderate) 08/17/2019  . Arthritis   . BBB (bundle branch block)   . Bradycardia   . Coronary artery disease   . Diabetes mellitus without complication ([place])   . DM type 2 with diabetic peripheral neuropathy ([place]) 09/19/2015  . Dysphagia   . Dyspnea    recent complaints of sob  . Dysrhythmia   . GERD (gastroesophageal reflux disease)   . Headache   . History of hiatal hernia   . History of shingles   . Hypertension   . IBS (irritable bowel syndrome)   . Mesenteric mass   . Rotator cuff tendinitis,  left   . Unspecified atrial fibrillation [facility])     Past Surgical History:  Procedure Laterality Date  . ABDOMINAL HYSTERECTOMY    . CHOLECYSTECTOMY    . COLONOSCOPY WITH PROPOFOL N/A 12/20/2016   Procedure: COLONOSCOPY WITH PROPOFOL;  Surgeon: Manya Silvas, MD;  Location: [facility] ENDOSCOPY;  Service: Endoscopy;  Laterality: N/A;  . CORONARY ANGIOPLASTY    . CORONARY ARTERY BYPASS GRAFT  2002  . ESOPHAGOGASTRODUODENOSCOPY (EGD) WITH PROPOFOL N/A 04/27/2017   Procedure: ESOPHAGOGASTRODUODENOSCOPY (EGD) WITH PROPOFOL;  Surgeon: Manya Silvas, MD;  Location: [facility]  ENDOSCOPY;  Service: Endoscopy;  Laterality: N/A;  . EUS N/A 04/24/2015   Procedure: ESOPHAGEAL ENDOSCOPIC ULTRASOUND (EUS) RADIAL;  Surgeon: Cora Daniels, MD;  Location[facility] ENDOSCOPY;  Service: Endoscopy;  Laterality: N/A;  . THYROIDECTOMY, PARTIAL      Family History  Problem Relation Age of Onset  . Breast cancer Neg Hx     Social History Social History   Tobacco Use  . Smoking status: Never Smoker  . Smokeless tobacco: Never Used  Substance Use Topics  . Alcohol use: No    Alcohol/week: 0.0 standard drinks  . Drug use: No    Allergies  Allergen Reactions  . Other Anaphylaxis, Hives and Itching    Iv dye  Confirmed with pt over the phone on 08/01/15   . Iodinated Diagnostic Agents Hives  . Oxycodone-Acetaminophen Hives  . Etodolac Itching    Current Outpatient Medications  Medication Sig Dispense Refill  . Accu-Chek Softclix Lancets lancets     . amLODipine (NORVASC) 5 MG tablet Take 5 mg by mouth daily.     . clotrimazole (LOTRIMIN) 1 % cream Apply 1 application topically 2 (two) times daily as needed (irritation).     . CONTOUR NEXT TEST test strip     . dicyclomine (BENTYL) 20 MG tablet     . FIASP 100 UNIT/ML SOLN Inject 70 Units into the skin continuous. Via Insulin pump    . fluticasone (FLONASE) 50 MCG/ACT nasal spray Place 2 sprays into both nostrils daily as needed for allergies.     . furosemide (LASIX) 40 MG tablet Take 40 mg by mouth every other day.     . gabapentin (NEURONTIN) 300 MG capsule Take 300 mg by mouth daily as needed (nerve pain).     . Insulin Disposable Pump (OMNIPOD DASH 5 PACK PODS) MISC CHANGE POD Q 48 H UTD    . Iron-Vitamin C (VITRON-C PO) Take 1 tablet by mouth daily.    . Magnesium 250 MG TABS Take 250 mg by mouth daily.    . metFORMIN (GLUCOPHAGE-XR) 500 MG 24 hr tablet Take 500-1,000 mg by mouth See admin instructions. Take 500 mg by mouth in the morning and 1000 mg at night    . Multiple Vitamin (MULTI-VITAMIN) tablet Take  1 tablet by mouth daily.    Marland Kitchen omeprazole (PRILOSEC) 20 MG capsule Take 20 mg by mouth daily.    . pravastatin (PRAVACHOL) 10 MG tablet Take 10 mg by mouth daily.     . promethazine (PHENERGAN) 12.5 MG tablet Take by mouth.    . sotalol (BETAPACE) 80 MG tablet Take 80 mg by mouth 2 (two) times daily.    Marland Kitchen spironolactone (ALDACTONE) 50 MG tablet Take 50 mg by mouth daily.     Marland Kitchen telmisartan (MICARDIS) 80 MG tablet Take 80 mg by mouth daily.    . Blood Glucose Monitoring Suppl (ACCU-CHEK GUIDE) w/Device KIT     .  BYDUREON BCISE 2 MG/0.85ML AUIJ Inject 2 mg into the skin every 7 (seven) days.     Marland Kitchen linaclotide (LINZESS) 72 MCG capsule Take 72 mcg by mouth every other day. Has not taken in a few months    . Rivaroxaban (XARELTO) 15 MG TABS tablet Take 15 mg by mouth daily with supper.      No current facility-administered medications for this visit.    Review of Systems Review of Systems  All other systems reviewed and are negative.   Blood pressure 132/75, pulse 72, temperature (!) 92.3 F (33.5 C), temperature source Temporal, height 5' 7" (1.702 m), weight 238 lb 6.4 oz (108.1 kg), SpO2 98 %. Body mass index is 37.34 kg/m.  Physical Exam Physical Exam Constitutional:      General: She is not in acute distress.    Appearance: Normal appearance. She is obese.  HENT:     Head: Normocephalic and atraumatic.     Nose:     Comments: Covered with a mask secondary to COVID-19 precautions    Mouth/Throat:     Comments: Covered with a mask secondary to COVID-19 precautions Eyes:     General: No scleral icterus.       Right eye: No discharge.        Left eye: No discharge.     Comments: No proptosis or exophthalmos.  No lid lag or stare.  Neck:     Comments: The thyroid is diffusely enlarged with a nodular contour.  The gland moves freely with deglutition and elevates above the level of the clavicles. Cardiovascular:     Rate and Rhythm: Normal rate and regular rhythm.     Heart  sounds: No murmur.  Pulmonary:     Effort: Pulmonary effort is normal. No respiratory distress.     Breath sounds: Normal breath sounds.  Abdominal:     General: Bowel sounds are normal.     Palpations: Abdomen is soft.     Comments: Protuberant, consistent with her level of obesity.  Genitourinary:    Comments: Deferred Musculoskeletal:        General: No deformity or signs of injury.     Cervical back: No rigidity.     Right lower leg: No edema.     Left lower leg: No edema.  Lymphadenopathy:     Cervical: No cervical adenopathy.  Skin:    General: Skin is warm and dry.  Neurological:     General: No focal deficit present.     Mental Status: She is alert and oriented to person, place, and time.  Psychiatric:        Mood and Affect: Mood normal.        Behavior: Behavior normal.     Data Reviewed I reviewed the results of the echocardiogram and myocardial perfusion scan that were performed by Dr. Nehemiah Massed.  These results are found in the electronic medical record.  I have copied the reports here:  LV Ejection Fraction (%) 50   Aortic Valve Regurgitation Grade trivial   Aortic Valve Stenosis Grade none   Aortic Valve Max Velocity (m/s) 1.5 m/sec  Mitral Valve Regurgitation Grade mild   Mitral Valve Stenosis Grade none   Tricuspid Valve Regurgitation Grade moderate   Tricuspid Valve Regurgitation Max Velocity (m/s) 3 m/sec  Right Ventricle Systolic Pressure (mmHg) 33.2 mmHg  LV End Diastolic Diameter (cm) 4.9 cm  LV End Systolic Diameter (cm) 3.9 cm  LV Septum Wall Thickness (cm) 1.1 cm  LV Posterior Wall Thickness (cm) 0.99 cm  Left Atrium Diameter (cm) 3.9 cm  Result Narrative              CARDIOLOGY DEPARTMENT        Woodlief, Horatio                  [id number]      [place] #: [id number]      [place], [place], [place] [address]     Date: 02/19/2020 09: 57 AM                                Adult  Female  Age: 85 yrs      ECHOCARDIOGRAM REPORT               Outpatient                                [facility]    STUDY:CHEST WALL        TAPE:0000: 00: 0: 00: 00 MD1: Nehemiah Massed, MD Bruce    ECHO:Yes  DOPPLER:Yes    FILE:0000-000-000    BP: 110/70 mmHg    COLOR:Yes  CONTRAST:No   MACHINE:Philips  RV BIOPSY:No     3D:No SOUND QLTY:Moderate      Height: 67 in   MEDIUM:None                       Weight: 262 lb                                BSA: 2.3 m2 _________________________________________________________________________________________        HISTORY: CABG, Recent A.Fib         REASON: Assess, LV function       INDICATION: I25.708 Coronary artery disease involving coronary bypass graft,             I48.0 Paroxysmal atrial fibrillation _________________________________________________________________________________________ ECHOCARDIOGRAPHIC MEASUREMENTS 2D DIMENSIONS AORTA         Values  Normal Range  MAIN PA     Values  Normal Range        Annulus: nm*     [2.1-2.5]     PA Main: nm*    [1.5-2.1]       Aorta Sin: 3.4 cm    [2.7-3.3]  RIGHT VENTRICLE      ST Junction: nm*     [2.3-2.9]     RV Base: 5.3 cm  [< 4.2]       Asc.Aorta: nm*     [2.3-3.1]     RV Mid: nm*    [<3.5] LEFT VENTRICLE                   RV Length: nm*    [<8.6]         LVIDd: 4.9 cm    [3.9-5.3]  INFERIOR VENA CAVA         LVIDs: 3.9 cm            Max. IVC: nm*    [<=2.1]           FS: 21.1 %    [>25]  Min. IVC: nm*          SWT: 1.1 cm    [0.5-0.9]  ------------------           PWT: 0.99 cm   [0.5-0.9]  nm* - not measured LEFT ATRIUM        LA Diam: 3.9 cm    [2.7-3.8]      LA A4C Area: nm*     [<20]       LA Volume: nm*     [22-52] _________________________________________________________________________________________ ECHOCARDIOGRAPHIC DESCRIPTIONS AORTIC ROOT          Size: Normal       Dissection: INDETERM FOR DISSECTION AORTIC VALVE        Leaflets: Tricuspid          Morphology: Normal        Mobility: Fully mobile LEFT VENTRICLE          Size: Normal            Anterior: Normal      Contraction: Normal             Lateral: Normal       Closest EF: 50% (Estimated)         Septal: Normal       LV Masses: No Masses            Apical: Normal          LVH: MILD LVH CONCENTRIC      Inferior: Normal                           Posterior: Normal      Dias.FxClass: (Grade 1) relaxation abnormal, E/A reversal MITRAL VALVE        Leaflets: Normal            Mobility: Fully mobile       Morphology: Normal LEFT ATRIUM          Size: MILDLY ENLARGED       LA Masses: No masses       IA Septum: Normal IAS MAIN PA          Size: Normal PULMONIC VALVE       Morphology: Normal            Mobility: Fully mobile RIGHT VENTRICLE       RV Masses: No Masses             Size: MILDLY ENLARGED       Free Wall: Normal           Contraction: Normal TRICUSPID VALVE        Leaflets: Normal            Mobility: Fully mobile       Morphology: Normal RIGHT ATRIUM          Size: MILDLY ENLARGED        RA Other: None        RA Mass: No masses PERICARDIUM         Fluid: No effusion INFERIOR VENACAVA          Size: Normal Normal  respiratory collapse _________________________________________________________________________________________  DOPPLER ECHO and OTHER SPECIAL PROCEDURES         Aortic: TRIVIAL AR         No AS             150.0 cm/sec peak vel   9.0 mmHg peak grad  Mitral: MILD MR          No MS             MV Inflow E Vel = nm*   MV Annulus E'Vel = nm*             E/E'Ratio = nm*       Tricuspid: MODERATE TR        No TS             296.7 cm/sec peak TR vel  40.2 mmHg peak RV pressure       Pulmonary: MILD PR          No PS _________________________________________________________________________________________ INTERPRETATION NORMAL LV FUNCTION WITH AN ESTIMATED EF = 45-50 % NORMAL RIGHT VENTRICULAR SYSTOLIC FUNCTION MODERATE TRICUSPID VALVE INSUFFICIENCY MILD-TO-MODERATE MITRAL VALVE INSUFFICIENCY TRACE AORTIC VALVE INSUFFICIENCY NO VALVULAR STENOSIS MILD RV ENLARGEMENT MILD BIATRIAL ENLARGEMENT MILD LVH _________________________________________________________________________________________ Electronically signed by   MD Serafina Royals on 02/25/2020 01: 41 PM      Performed By: Johnathan Hausen, RDCS, RVT   Ordering Physician: Nehemiah Massed, MD [place] infusion EKG Normal myocardial perfusion without evidence of myocardial ischemia  Serafina Royals  Result Narrative  [address] [place], [place], [place] [address] [id number]  Procedure: Pharmacologic Myocardial Perfusion Imaging  ONE day procedure  Indication: Coronary artery disease involving coronary bypass graft of  native heart with other forms of angina pectoris (CMS-HCC) Plan: NM myocardial perfusion SPECT multiple (stress     and rest), ECG stress test only  Ordering Physician:   Dr. Serafina Royals   Clinical  History: 63 y.o. year old female Vitals: Height: 35 in Weight: 242 lb Cardiac risk factors include:   Hyperlipidemia, Diabetes, HTN, Obesity, CABG and CAD    Procedure:  Pharmacologic stress testing was performed with Regadenoson using a single  use 0.32m/5ml (0.08 mg/ml) prefilled syringe intravenously infused as a  bolus dose over 10-15 seconds. The stress test was stopped due to Infusion  completion. Blood pressure response was normal. The patient did not  develop any symptoms other than fatigue during the procedure.   Rest HR: 71bpm Rest BP: 134/713mg Max HR: 86bpm Min BP: 132/7035m  Stress Test Administered by: DINOswald HillockMA  ECG Interpretation: Rest ECG: normal sinus rhythm, right bundle branch block (RBBB) Stress ECG: normal sinus rhythm,  Recovery ECG: normal sinus rhythm ECG Interpretation: non-diagnostic due to pharmacologic testing.   Administrations This Visit   regadenoson (LEXISCAN) 0.4 mg/5 mL inj syringe 0.4 mg   Admin Date 02/19/2020 Action Given Dose 0.4 mg Route Intravenous Administered By GoaHerbert SetaNMT     technetium Tc99m33mtamibi (CARDIOLITE) injection 11.291.47licurie   Admin Date 02/19/2020 Action Given Dose 11.282.95licurie Route Intravenous Administered By GoarHerbert SetaMT     technetium Tc99m 71mamibi (CARDIOLITE) injection 28.9362.13icurie   Admin Date 02/19/2020 Action Given Dose 28.9308.65icurie Route Intravenous Administered By GoardHerbert SetaT       Gated post-stress perfusion imaging was performed 30 minutes after stress.  Rest images were performed 30 minutes after injection.  Gated LV Analysis:   Summary of LV Perfusion: Normal,  Summary of LV Function: Normal   TID Ratio: 0.7  LVEF= 58%  FINDINGS: Regional wall motion: reveals normal myocardial thickening and wall  motion. The overall quality of the study is good.  Artifacts noted: no Left ventricular cavity:  normal.  Perfusion Analysis: SPECT images  demonstrate homogeneous tracer  distribution throughout the myocardium. Defect type : Normal       Assessment This is a 64 year old woman who has a history of a partial thyroid resection in 2010, although we do not have records describing the procedure or what was actually removed.  She does have a multinodular goiter and based on sonographic imaging, there is no evidence that a resection occurred.  She has significant compressive symptoms and desires surgical resection.  I have offered her completion thyroidectomy.  I anticipate that this will essentially be a total thyroidectomy, based upon her imaging.  Plan The risks of thyroid surgery were discussed, including (but not limited to): bleeding, infection, damage to surrounding structures/tissues, injury (temporary or permanent) to the recurrent laryngeal nerve, hypoparathyroidism (temporary or permanent), need for thyroid hormone replacement therapy, need for additional surgery and/or treatment, recurrence of disease, tracheostomy (temporary or permanent).  The patient had the opportunity to ask any questions and these were answered to their satisfaction.  We will contact Dr. Nehemiah Massed for cardiac clearance.  The patient is eager to proceed.     Fredirick Maudlin 03/04/2020, 9:09 AM

## 2020-03-04 NOTE — Patient Instructions (Signed)
Our surgery scheduler Marzetta Board will contact you within the 24-48 hours. During that call, Marzetta Board will discussed with you the preparation prior to surgery and she will discuss with you the different dates and times. Please have the BLUE sheet available when she contacts you. If you have any questions or concerns, please feel free to contact our office. Cardiac Clearance was sent at today's visit.   Thyroidectomy A thyroidectomy is a surgery that is done to remove the thyroid gland. The thyroid is a butterfly-shaped gland that is located at the lower front of your neck. It produces thyroid hormone, which is a substance that helps to control certain body processes. You may have a:  Total thyroidectomy. All of your thyroid is removed.  Thyroid lobectomy. Part of your thyroid is removed. The amount of thyroid gland tissue that is removed during your surgery depends on the reason for the procedure. Reasons to have this procedure include treatment for:  Thyroid nodules.  Thyroid cancer.  Benign thyroid tumors.  Goiter.  Overactive thyroid gland (hyperthyroidism). There are two ways to do this procedure. Conventional, or open, thyroidectomy uses one large incision to remove the thyroid gland. This is the most common method. Endoscopic thyroidectomy, a less invasive method, uses a narrow tube with a light and camera (endoscope) to remove the gland. Tell a health care provider about:  Any allergies you have.  All medicines you are taking, including vitamins, herbs, eye drops, creams, and over-the-counter medicines.  Any problems you or family members have had with anesthetic medicines.  Any blood disorders you have.  Any surgeries you have had.  Any medical conditions you have.  Whether you are pregnant or may be pregnant. What are the risks? Generally, this is a safe procedure. However, problems may occur, including:  Damage to the parathyroid glands. These are located behind your thyroid  gland. They maintain the calcium levels in the body. Damage may lead to: ? A decrease in parathyroid hormone levels (hypoparathyroidism). ? A decrease in calcium levels. This will make your nerves irritable and may cause muscle spasms.  An increase in thyroid hormone.  Damage to the nerves of your voice box (larynx). This can be temporary or long-term (rare).  Hoarseness. This usually resolves in 24-48 hours.  Bleeding.  Infection. What happens before the procedure? Staying hydrated Follow instructions from your health care provider about hydration, which may include:  Up to 2 hours before the procedure - you may continue to drink clear liquids, such as water, clear fruit juice, black coffee, and plain tea. Eating and drinking restrictions Follow instructions from your health care provider about eating and drinking, which may include:  8 hours before the procedure - stop eating heavy meals or foods such as meat, fried foods, or fatty foods.  6 hours before the procedure - stop eating light meals or foods, such as toast or cereal.  6 hours before the procedure - stop drinking milk or drinks that contain milk.  2 hours before the procedure - stop drinking clear liquids. Medicines Ask your health care provider about:  Changing or stopping your regular medicines. This is especially important if you are taking diabetes medicines or blood thinners.  Taking medicines such as aspirin and ibuprofen. These medicines can thin your blood. Do not take these medicines unless your health care provider tells you to take them.  Taking over-the-counter medicines, vitamins, herbs, and supplements. General instructions  You may be asked to shower with a germ-killing soap.  Plan  to have someone take you home from the hospital or clinic.  Plan to have a responsible adult care for you for at least 24 hours after you leave the hospital or clinic. This is important. What happens during the  procedure?  To reduce your risk of infection: ? Your health care team will wash or sanitize their hands. ? Hair may be removed from the surgical area. ? Your skin will be washed with soap.  An IV will be inserted into one of your veins.  You will be given one or more of the following: ? A medicine to help you relax (sedative). ? A medicine to make you fall asleep (general anesthetic).  Your health care provider will perform your surgery using one of two methods: ? For open thyroidectomy, an incision will be made in your lower neck. Muscles in the area will be separated to reveal your thyroid gland. ? For endoscopic thyroidectomy, several small incisions will be made in your neck, chest, or armpit. An endoscopewill be inserted into an incision.  Your health care provider may monitor laryngeal nerve function during the procedure for safety reasons.  Part or all of your thyroid gland will be removed.  A tube (drain) may be placed at the incision site to drain blood and fluids that accumulate under the skin after the procedure. The drain may have to stay in place for a day or two after the procedure.  The incision will be closed with stitches (sutures).  A dressing will be placed over your incision. The procedure may vary among health care providers and hospitals. What happens after the procedure?  Your blood pressure, heart rate, breathing rate, and blood oxygen level will be monitored often until the medicines you were given have worn off.  You will be given pain medicine as needed.  Your provider will check your ability to talk and swallow after the procedure.  You will gradually start to drink liquids and have soft foods as tolerated.  You may have a blood test to check the level of calcium in your body.  If you had a drain put in during the procedure, it will usually be removed the next day. Summary  A thyroidectomy is a surgery that is done to remove the thyroid  gland.  The procedure will be done in one of two ways: conventional, or open, thyroidectomy or endoscopic thyroidectomy.  Serious complications are rare.  Plan to have a responsible adult care for you for at least 24 hours after you leave the hospital or clinic. This is important. This information is not intended to replace advice given to you by your health care provider. Make sure you discuss any questions you have with your health care provider. Document Revised: 10/21/2017 Document Reviewed: 09/13/2017 Elsevier Patient Education  2020 [employer].

## 2020-03-04 NOTE — Telephone Encounter (Signed)
Cardiac Clearance was faxed over to [facility] office at [phone].

## 2020-03-04 NOTE — H&P (View-Only) (Signed)
Patient ID: Hailey Howard, female   DOB: 08/28/1956, 64 y.o.   MRN: [id number]  Chief Complaint  Patient presents with  . Follow-up    Re-evaluate prior to thyroidectomy w/Dr. Jaydrien Wassenaar    HPI Hailey Howard is a 64 y.o. female.  I first saw her in December 2020.  I have copied my initial HPI here:  "She has a history of prior partial thyroid resection in 2010.  She thinks this occurred on the right but we do not have access to those records.  Recent thyroid ultrasound suggests that the prior resection was quite limited, as she has essentially a full thyroid, by volume, on both sides.  There is a dominant nodule on the right that measures 5.82 cm in greatest dimension.  This was biopsied in September 2020 and was benign.  Due to compressive symptoms, however, [name] is interested in completion thyroidectomy.  She endorses dysphagia to both solids and liquids.  She has pressure in her neck while in the supine position.  She describes frequent throat clearing.  There has been no change in her voice.  No heat or cold intolerance.  She has occasional heart palpitations and sometimes feels jittery or anxious.  She describes alopecia, but no change in the texture of her skin or nails.  She does have intermittent constipation and diarrhea but ascribes this to irritable bowel syndrome.  She endorses fatigue.  She denies any significant weight loss or weight gain.  No ocular symptoms.  She currently does not take any thyroid hormone replacement.  She has no family history of thyroid problems or thyroid cancer.  She denies any occupational or therapeutic exposure to head and neck irradiation."  She was scheduled for completion thyroidectomy, however she developed worsening dyspnea and her cardiologist withdrew clearance.  She has undergone additional evaluation with him including a myocardial perfusion scan and echocardiogram.  Although there are no notes available for my review, these do appear to  indicate that she will likely receive clearance to proceed with her operation..  She is here today for reevaluation, since it has been several months since I last saw her.  Today, she continues to endorse dysphagia to both solids and liquids.  She often feels like she is going to choke.  She endorses frequent throat clearing.  She has pressure in her neck while supine.  She denies heat or cold intolerance.  No heart palpitations or hand tremors.  No changes to the texture of her hair, skin, or fingernails.  Her weight has been stable.  She is eager to proceed with thyroidectomy.  She states that she is no longer taking Xarelto; her cardiologist has her on a daily baby aspirin instead.    Past Medical History:  Diagnosis Date  . Anemia    iron anemia, now getting infusions  . Anemia of chronic kidney failure, stage 3 (moderate) 08/17/2019  . Arthritis   . BBB (bundle branch block)   . Bradycardia   . Coronary artery disease   . Diabetes mellitus without complication (HCC)   . 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 Dysphagia   . Dyspnea    recent complaints of sob  . Dysrhythmia   . GERD (gastroesophageal reflux disease)   . Headache   . History of hiatal hernia   . History of shingles   . Hypertension   . IBS (irritable bowel syndrome)   . Mesenteric mass   . Rotator cuff tendinitis,   left   . Unspecified atrial fibrillation [facility])     Past Surgical History:  Procedure Laterality Date  . ABDOMINAL HYSTERECTOMY    . CHOLECYSTECTOMY    . COLONOSCOPY WITH PROPOFOL N/A 12/20/2016   Procedure: COLONOSCOPY WITH PROPOFOL;  Surgeon: Manya Silvas, MD;  Location: [facility] ENDOSCOPY;  Service: Endoscopy;  Laterality: N/A;  . CORONARY ANGIOPLASTY    . CORONARY ARTERY BYPASS GRAFT  2002  . ESOPHAGOGASTRODUODENOSCOPY (EGD) WITH PROPOFOL N/A 04/27/2017   Procedure: ESOPHAGOGASTRODUODENOSCOPY (EGD) WITH PROPOFOL;  Surgeon: Manya Silvas, MD;  Location: [facility]  ENDOSCOPY;  Service: Endoscopy;  Laterality: N/A;  . EUS N/A 04/24/2015   Procedure: ESOPHAGEAL ENDOSCOPIC ULTRASOUND (EUS) RADIAL;  Surgeon: Cora Daniels, MD;  Location: [facility] ENDOSCOPY;  Service: Endoscopy;  Laterality: N/A;  . THYROIDECTOMY, PARTIAL      Family History  Problem Relation Age of Onset  . Breast cancer Neg Hx     Social History Social History   Tobacco Use  . Smoking status: Never Smoker  . Smokeless tobacco: Never Used  Substance Use Topics  . Alcohol use: No    Alcohol/week: 0.0 standard drinks  . Drug use: No    Allergies  Allergen Reactions  . Other Anaphylaxis, Hives and Itching    Iv dye  Confirmed with pt over the phone on 08/01/15   . Iodinated Diagnostic Agents Hives  . Oxycodone-Acetaminophen Hives  . Etodolac Itching    Current Outpatient Medications  Medication Sig Dispense Refill  . Accu-Chek Softclix Lancets lancets     . amLODipine (NORVASC) 5 MG tablet Take 5 mg by mouth daily.     . clotrimazole (LOTRIMIN) 1 % cream Apply 1 application topically 2 (two) times daily as needed (irritation).     . CONTOUR NEXT TEST test strip     . dicyclomine (BENTYL) 20 MG tablet     . FIASP 100 UNIT/ML SOLN Inject 70 Units into the skin continuous. Via Insulin pump    . fluticasone (FLONASE) 50 MCG/ACT nasal spray Place 2 sprays into both nostrils daily as needed for allergies.     . furosemide (LASIX) 40 MG tablet Take 40 mg by mouth every other day.     . gabapentin (NEURONTIN) 300 MG capsule Take 300 mg by mouth daily as needed (nerve pain).     . Insulin Disposable Pump (OMNIPOD DASH 5 PACK PODS) MISC CHANGE POD Q 48 H UTD    . Iron-Vitamin C (VITRON-C PO) Take 1 tablet by mouth daily.    . Magnesium 250 MG TABS Take 250 mg by mouth daily.    . metFORMIN (GLUCOPHAGE-XR) 500 MG 24 hr tablet Take 500-1,000 mg by mouth See admin instructions. Take 500 mg by mouth in the morning and 1000 mg at night    . Multiple Vitamin (MULTI-VITAMIN) tablet Take  1 tablet by mouth daily.    Marland Kitchen omeprazole (PRILOSEC) 20 MG capsule Take 20 mg by mouth daily.    . pravastatin (PRAVACHOL) 10 MG tablet Take 10 mg by mouth daily.     . promethazine (PHENERGAN) 12.5 MG tablet Take by mouth.    . sotalol (BETAPACE) 80 MG tablet Take 80 mg by mouth 2 (two) times daily.    Marland Kitchen spironolactone (ALDACTONE) 50 MG tablet Take 50 mg by mouth daily.     Marland Kitchen telmisartan (MICARDIS) 80 MG tablet Take 80 mg by mouth daily.    . Blood Glucose Monitoring Suppl (ACCU-CHEK GUIDE) w/Device KIT     .  BYDUREON BCISE 2 MG/0.85ML AUIJ Inject 2 mg into the skin every 7 (seven) days.     . linaclotide (LINZESS) 72 MCG capsule Take 72 mcg by mouth every other day. Has not taken in a few months    . Rivaroxaban (XARELTO) 15 MG TABS tablet Take 15 mg by mouth daily with supper.      No current facility-administered medications for this visit.    Review of Systems Review of Systems  All other systems reviewed and are negative.   Blood pressure 132/75, pulse 72, temperature (!) 92.3 F (33.5 C), temperature source Temporal, height 5' 7" (1.702 m), weight 238 lb 6.4 oz (108.1 kg), SpO2 98 %. Body mass index is 37.34 kg/m.  Physical Exam Physical Exam Constitutional:      General: She is not in acute distress.    Appearance: Normal appearance. She is obese.  HENT:     Head: Normocephalic and atraumatic.     Nose:     Comments: Covered with a mask secondary to COVID-19 precautions    Mouth/Throat:     Comments: Covered with a mask secondary to COVID-19 precautions Eyes:     General: No scleral icterus.       Right eye: No discharge.        Left eye: No discharge.     Comments: No proptosis or exophthalmos.  No lid lag or stare.  Neck:     Comments: The thyroid is diffusely enlarged with a nodular contour.  The gland moves freely with deglutition and elevates above the level of the clavicles. Cardiovascular:     Rate and Rhythm: Normal rate and regular rhythm.     Heart  sounds: No murmur.  Pulmonary:     Effort: Pulmonary effort is normal. No respiratory distress.     Breath sounds: Normal breath sounds.  Abdominal:     General: Bowel sounds are normal.     Palpations: Abdomen is soft.     Comments: Protuberant, consistent with her level of obesity.  Genitourinary:    Comments: Deferred Musculoskeletal:        General: No deformity or signs of injury.     Cervical back: No rigidity.     Right lower leg: No edema.     Left lower leg: No edema.  Lymphadenopathy:     Cervical: No cervical adenopathy.  Skin:    General: Skin is warm and dry.  Neurological:     General: No focal deficit present.     Mental Status: She is alert and oriented to person, place, and time.  Psychiatric:        Mood and Affect: Mood normal.        Behavior: Behavior normal.     Data Reviewed I reviewed the results of the echocardiogram and myocardial perfusion scan that were performed by Dr. Kowalski.  These results are found in the electronic medical record.  I have copied the reports here:  LV Ejection Fraction (%) 50   Aortic Valve Regurgitation Grade trivial   Aortic Valve Stenosis Grade none   Aortic Valve Max Velocity (m/s) 1.5 m/sec  Mitral Valve Regurgitation Grade mild   Mitral Valve Stenosis Grade none   Tricuspid Valve Regurgitation Grade moderate   Tricuspid Valve Regurgitation Max Velocity (m/s) 3 m/sec  Right Ventricle Systolic Pressure (mmHg) 40.2 mmHg  LV End Diastolic Diameter (cm) 4.9 cm  LV End Systolic Diameter (cm) 3.9 cm  LV Septum Wall Thickness (cm) 1.1 cm    LV Posterior Wall Thickness (cm) 0.99 cm  Left Atrium Diameter (cm) 3.9 cm  Result Narrative              CARDIOLOGY DEPARTMENT        Weitz, Megin FORD      [facility]                  [id number]      [facility]              Acct #: [id number]      [address], [place], [place] [address]     Date: 02/19/2020 09: 21 AM                                Adult  Female  Age: 64 yrs      ECHOCARDIOGRAM REPORT               Outpatient                                [id number]^^KCWC    STUDY:CHEST WALL        TAPE:0000: 00: 0: 00: 00 MD1: Kowalski, MD Bruce    ECHO:Yes  DOPPLER:Yes    FILE:0000-000-000    BP: 110/70 mmHg    COLOR:Yes  CONTRAST:No   MACHINE:Philips  RV BIOPSY:No     3D:No SOUND QLTY:Moderate      Height: 67 in   MEDIUM:None                       Weight: 262 lb                                BSA: 2.3 m2 _________________________________________________________________________________________        HISTORY: CABG, Recent A.Fib         REASON: Assess, LV function       INDICATION: I25.708 Coronary artery disease involving coronary bypass graft,             I48.0 Paroxysmal atrial fibrillation _________________________________________________________________________________________ ECHOCARDIOGRAPHIC MEASUREMENTS 2D DIMENSIONS AORTA         Values  Normal Range  MAIN PA     Values  Normal Range        Annulus: nm*     [2.1-2.5]     PA Main: nm*    [1.5-2.1]       Aorta Sin: 3.4 cm    [2.7-3.3]  RIGHT VENTRICLE      ST Junction: nm*     [2.3-2.9]     RV Base: 5.3 cm  [< 4.2]       Asc.Aorta: nm*     [2.3-3.1]     RV Mid: nm*    [<3.5] LEFT VENTRICLE                   RV Length: nm*    [<8.6]         LVIDd: 4.9 cm    [3.9-5.3]  INFERIOR VENA CAVA         LVIDs: 3.9 cm            Max. IVC: nm*    [<=2.1]           FS: 21.1 %    [>25]        Min. IVC: nm*          SWT: 1.1 cm    [0.5-0.9]  ------------------           PWT: 0.99 cm   [0.5-0.9]  nm* - not measured LEFT ATRIUM        LA Diam: 3.9 cm    [2.7-3.8]      LA A4C Area: nm*     [<20]       LA Volume: nm*     [22-52] _________________________________________________________________________________________ ECHOCARDIOGRAPHIC DESCRIPTIONS AORTIC ROOT          Size: Normal       Dissection: INDETERM FOR DISSECTION AORTIC VALVE        Leaflets: Tricuspid          Morphology: Normal        Mobility: Fully mobile LEFT VENTRICLE          Size: Normal            Anterior: Normal      Contraction: Normal             Lateral: Normal       Closest EF: 50% (Estimated)         Septal: Normal       LV Masses: No Masses            Apical: Normal          LVH: MILD LVH CONCENTRIC      Inferior: Normal                           Posterior: Normal      Dias.FxClass: (Grade 1) relaxation abnormal, E/A reversal MITRAL VALVE        Leaflets: Normal            Mobility: Fully mobile       Morphology: Normal LEFT ATRIUM          Size: MILDLY ENLARGED       LA Masses: No masses       IA Septum: Normal IAS MAIN PA          Size: Normal PULMONIC VALVE       Morphology: Normal            Mobility: Fully mobile RIGHT VENTRICLE       RV Masses: No Masses             Size: MILDLY ENLARGED       Free Wall: Normal           Contraction: Normal TRICUSPID VALVE        Leaflets: Normal            Mobility: Fully mobile       Morphology: Normal RIGHT ATRIUM          Size: MILDLY ENLARGED        RA Other: None        RA Mass: No masses PERICARDIUM         Fluid: No effusion INFERIOR VENACAVA          Size: Normal Normal  respiratory collapse _________________________________________________________________________________________  DOPPLER ECHO and OTHER SPECIAL PROCEDURES         Aortic: TRIVIAL AR         No AS             150.0 cm/sec peak vel   9.0 mmHg peak grad           Mitral: MILD MR          No MS             MV Inflow E Vel = nm*   MV Annulus E'Vel = nm*             E/E'Ratio = nm*       Tricuspid: MODERATE TR        No TS             296.7 cm/sec peak TR vel  40.2 mmHg peak RV pressure       Pulmonary: MILD PR          No PS _________________________________________________________________________________________ INTERPRETATION NORMAL LV FUNCTION WITH AN ESTIMATED EF = 45-50 % NORMAL RIGHT VENTRICULAR SYSTOLIC FUNCTION MODERATE TRICUSPID VALVE INSUFFICIENCY MILD-TO-MODERATE MITRAL VALVE INSUFFICIENCY TRACE AORTIC VALVE INSUFFICIENCY NO VALVULAR STENOSIS MILD RV ENLARGEMENT MILD BIATRIAL ENLARGEMENT MILD LVH _________________________________________________________________________________________ Electronically signed by   MD Bruce Kowalski on 02/25/2020 01: 55 PM      Performed By: Martin, Matthew, RDCS, RVT   Ordering Physician: Kowalski, MD Bruce   Indeterminant Lexiscan infusion EKG Normal myocardial perfusion without evidence of myocardial ischemia  Bruce Kowalski  Result Narrative  CARDIOLOGY DEPARTMENT [facility] [address], [place], [place] [address] [phone]  Procedure: Pharmacologic Myocardial Perfusion Imaging  ONE day procedure  Indication: Coronary artery disease involving coronary bypass graft of  native heart with other forms of angina pectoris (CMS-HCC) Plan: NM myocardial perfusion SPECT multiple (stress     and rest), ECG stress test only  Ordering Physician:   Dr. Bruce Kowalski   Clinical  History: 64 y.o. year old female Vitals: Height: 67 in Weight: 242 lb Cardiac risk factors include:   Hyperlipidemia, Diabetes, HTN, Obesity, CABG and CAD    Procedure:  Pharmacologic stress testing was performed with Regadenoson using a single  use 0.4mg/5ml (0.08 mg/ml) prefilled syringe intravenously infused as a  bolus dose over 10-15 seconds. The stress test was stopped due to Infusion  completion. Blood pressure response was normal. The patient did not  develop any symptoms other than fatigue during the procedure.   Rest HR: 71bpm Rest BP: 134/78mmHg Max HR: 86bpm Min BP: 132/70mmHg  Stress Test Administered by: DINA WALLACE, CMA  ECG Interpretation: Rest ECG: normal sinus rhythm, right bundle branch block (RBBB) Stress ECG: normal sinus rhythm,  Recovery ECG: normal sinus rhythm ECG Interpretation: non-diagnostic due to pharmacologic testing.   Administrations This Visit   regadenoson (LEXISCAN) 0.4 mg/5 mL inj syringe 0.4 mg   Admin Date 02/19/2020 Action Given Dose 0.4 mg Route Intravenous Administered By Goard, Scott N, CNMT     technetium Tc99m sestamibi (CARDIOLITE) injection 11.25 millicurie   Admin Date 02/19/2020 Action Given Dose 11.25 millicurie Route Intravenous Administered By Goard, Scott N, CNMT     technetium Tc99m sestamibi (CARDIOLITE) injection 28.93 millicurie   Admin Date 02/19/2020 Action Given Dose 28.93 millicurie Route Intravenous Administered By Goard, Scott N, CNMT       Gated post-stress perfusion imaging was performed 30 minutes after stress.  Rest images were performed 30 minutes after injection.  Gated LV Analysis:   Summary of LV Perfusion: Normal,  Summary of LV Function: Normal   TID Ratio: 0.7  LVEF= 58%  FINDINGS: Regional wall motion: reveals normal myocardial thickening and wall  motion. The overall quality of the study is good.  Artifacts noted: no Left ventricular cavity:  normal.  Perfusion Analysis: SPECT images   demonstrate homogeneous tracer  distribution throughout the myocardium. Defect type : Normal       Assessment This is a 64 year old woman who has a history of a partial thyroid resection in 2010, although we do not have records describing the procedure or what was actually removed.  She does have a multinodular goiter and based on sonographic imaging, there is no evidence that a resection occurred.  She has significant compressive symptoms and desires surgical resection.  I have offered her completion thyroidectomy.  I anticipate that this will essentially be a total thyroidectomy, based upon her imaging.  Plan The risks of thyroid surgery were discussed, including (but not limited to): bleeding, infection, damage to surrounding structures/tissues, injury (temporary or permanent) to the recurrent laryngeal nerve, hypoparathyroidism (temporary or permanent), need for thyroid hormone replacement therapy, need for additional surgery and/or treatment, recurrence of disease, tracheostomy (temporary or permanent).  The patient had the opportunity to ask any questions and these were answered to their satisfaction.  We will contact Dr. Nehemiah Massed for cardiac clearance.  The patient is eager to proceed.     Fredirick Maudlin 03/04/2020, 9:09 AM

## 2020-03-05 ENCOUNTER — Telehealth: Payer: Self-pay | Admitting: General Surgery

## 2020-03-05 NOTE — Telephone Encounter (Signed)
Outgoing call made, left message for patient to call so that we can advise of Pre-Admission date/time, COVID Testing date and Surgery date.  Surgery Date: 03/14/20 Preadmission Testing Date: 03/07/20 (phone 8a-1p) Covid Testing Date: 03/12/20 - patient advised to go to the [facility] ([place]) between 8a-1p  Also patient is to call [phone], between 1-3:00pm the day before surgery, to find out what time to arrive for surgery.

## 2020-03-06 NOTE — Telephone Encounter (Signed)
Cardiac Clearance received from [place] office. Patient is optimized at [facility] for surgery.

## 2020-03-07 ENCOUNTER — Other Ambulatory Visit: Payer: Self-pay | Admitting: General Surgery

## 2020-03-07 ENCOUNTER — Encounter
Admission: RE | Admit: 2020-03-07 | Discharge: 2020-03-07 | Disposition: A | Discharge status: Home / Self Care | Payer: Medicare HMO | Source: Ambulatory Visit | Attending: General Surgery | Admitting: General Surgery

## 2020-03-07 ENCOUNTER — Other Ambulatory Visit: Payer: Self-pay

## 2020-03-07 DIAGNOSIS — Z01812 Encounter for preprocedural laboratory examination: Secondary | ICD-10-CM | POA: Insufficient documentation

## 2020-03-07 DIAGNOSIS — E042 Nontoxic multinodular goiter: Secondary | ICD-10-CM

## 2020-03-07 HISTORY — DX: Peripheral vascular disease, unspecified: I73.9

## 2020-03-07 NOTE — Pre-Procedure Instructions (Signed)
ECG 12-lead3/02/2020 [place] Component Name Value Ref Range  Vent Rate (bpm) 75   PR Interval (msec) 190   QRS Interval (msec) 144   QT Interval (msec) 440   QTc (msec) 491   Other Result Information  This result has an attachment that is not available.  Result Narrative  Normal sinus rhythm with sinus arrhythmia Right bundle branch block Left anterior fascicular block * Bifascicular block * Moderate voltage criteria for LVH, may be normal variant Possible Lateral infarct , age undetermined Abnormal ECG When compared with ECG of 13-Apr-2018 09:31, No significant change was found I reviewed and concur with this report. Electronically signed [id number] MD, Darnell Level [phone]) on 01/29/2020 12:58:36 PM

## 2020-03-07 NOTE — Patient Instructions (Signed)
INSTRUCTIONS FOR SURGERY     Your surgery is scheduled for:   Friday, April 23RD     To find out your arrival time for the day of surgery,          please call [phone] between 1 pm and 3 pm on :  Thursday, April 22ND     When you arrive for surgery, report to the [facility].       Do NOT stop on the first floor to register.    REMEMBER: Instructions that are not followed completely may result in serious medical risk,  up to and including death, or upon the discretion of your surgeon and anesthesiologist,            your surgery may need to be rescheduled.  __X__ 1. Do not eat food after midnight the night before your procedure.                    No gum, candy, lozenger, tic tacs, tums or hard candies.                  ABSOLUTELY NOTHING SOLID IN YOUR MOUTH AFTER MIDNIGHT                    You may drink unlimited clear liquids up to 2 hours before you are scheduled to arrive for surgery.                   Do not drink anything within those 2 hours unless you need to take medicine, then take the                   smallest amount you need.  Clear liquids include:  water, apple juice without pulp,                   any flavor Gatorade, Black coffee, black tea.  Sugar may be added but no dairy/ honey /lemon.                        Broth and jello is not considered a clear liquid.  __x__  2. On the morning of surgery, please brush your teeth with toothpaste and water. You may rinse with                  mouthwash if you wish but DO NOT SWALLOW TOOTHPASTE OR MOUTHWASH  __X___3. NO alcohol for 24 hours before or after surgery.  __x___ 4.  Do NOT smoke or use e-cigarettes for 24 HOURS PRIOR TO SURGERY.                      DO NOT Use any chewable tobacco products for at least 6 hours prior to surgery.  __x___ 5. If you start any new medication after this appointment and prior to surgery, please     Bring it with you on the day of surgery.  ___x__ 6. Notify your doctor if there is any change in your medical condition, such as fever,  infection, vomitting, diarrhea or any open sores.  __x___ 7.  USE the CHG SOAP as instructed, the night before surgery and the day of surgery.                   Once you have washed with this soap, do NOT use any of the following: Powders, perfumes                   or lotions. Please do not wear make up, hairpins, clips or nail polish. You may wear deodorant.                   Men may shave their face and neck.  Women need to shave 48 hours prior to surgery.                   DO NOT wear ANY jewelry on the day of surgery. If there are rings that are too tight to                    remove easily, please address this prior to the surgery day. Piercings need to be removed.                                                                     NO METAL ON YOUR BODY.                    Do NOT bring any valuables.  If you came to Pre-Admit testing then you will not need license,                     insurance card or credit card.  If you will be staying overnight, please either leave your things in                     the car or have your family be responsible for these items.                     [facility] IS NOT RESPONSIBLE FOR BELONGINGS OR VALUABLES.  ___X__ 8. DO NOT wear contact lenses on surgery day.  You may not have dentures,                     Hearing aides, contacts or glasses in the operating room. These items can be                    Placed in the Recovery Room to receive immediately after surgery.  __x___ 9. IF YOU ARE SCHEDULED TO GO HOME ON THE SAME DAY, YOU MUST                   Have someone to drive you home and to stay with you  for the first 24 hours.                    Have an arrangement prior to arriving on surgery day.  ___x__ 10. Take the following medications on the morning of surgery with a sip of water:  1. AMLODIPINE/NORVASC                     2. NEURONTIN/GABAPENTIN                     3. PRILOSEC                     4. PREVACHOL                     5. BETAPACE                     6. FLONASE NASAL SPRAY  _____ 11.  Follow any instructions provided to you by your surgeon.                        Such as enema, clear liquid bowel prep  __X__  12. STOP ASPIRIN PRODUCTS AS OF April 16TH.                      CONTINUE ASPIRIN BUT DO NOT TAKE ON SURGERY DAY.                       THIS INCLUDES BC POWDERS / GOODIES POWDER                    YOU MAY TAKE TYLENOL ANY TIME PRIOR TO SURGERY.  __X___ 14.  Stop supplements until after surgery.                     This includes: VITRON-C //MULTIVITAMINS           __X____16.  Stop Metformin 2 full days prior to surgery.  LAST DOSE, EVENING OF 03/11/20                        Do NOT take any diabetes medications on surgery day.  __X____17.  Continue to take the following medications but do not take on the morning of surgery:                           LASIX // ALDACTONE // MICARDIS(TELMISARTAN) // ASPIRIN  __X____18. If staying overnight, please have appropriate shoes to wear to be able to walk around the unit.                   Wear clean and comfortable clothing to the hospital.  BRING A PHONE [facility]. HAVE PHONE NUMBERS FOR YOUR CONTACTS. IF YOU HAVE YOUR MEDICAL POA DOCUMENTS, PLEASE BRING WITH YOU SO WE CAN MAKE     A COPY FOR YOUR CHART.

## 2020-03-12 ENCOUNTER — Other Ambulatory Visit
Admission: RE | Admit: 2020-03-12 | Discharge: 2020-03-12 | Disposition: A | Discharge status: Home / Self Care | Payer: Medicare HMO | Source: Ambulatory Visit | Attending: General Surgery | Admitting: General Surgery

## 2020-03-12 ENCOUNTER — Other Ambulatory Visit: Payer: Self-pay

## 2020-03-12 DIAGNOSIS — Z01812 Encounter for preprocedural laboratory examination: Secondary | ICD-10-CM | POA: Diagnosis present

## 2020-03-12 DIAGNOSIS — Z20822 Contact with and (suspected) exposure to covid-19: Secondary | ICD-10-CM | POA: Diagnosis not present

## 2020-03-12 LAB — SARS CORONAVIRUS 2 (TAT 6-24 HRS): SARS Coronavirus 2: NEGATIVE

## 2020-03-14 ENCOUNTER — Other Ambulatory Visit: Payer: Self-pay

## 2020-03-14 ENCOUNTER — Encounter: Payer: Self-pay | Admitting: General Surgery

## 2020-03-14 ENCOUNTER — Ambulatory Visit: Payer: Medicare HMO | Admitting: Anesthesiology

## 2020-03-14 ENCOUNTER — Encounter
Admission: RE | Disposition: A | Discharge status: Home / Self Care | Payer: Self-pay | Source: Home / Self Care | Attending: General Surgery

## 2020-03-14 ENCOUNTER — Observation Stay
Admission: RE | Admit: 2020-03-14 | Discharge: 2020-03-15 | Disposition: A | Discharge status: Home / Self Care | Payer: Medicare HMO | Attending: General Surgery | Admitting: General Surgery

## 2020-03-14 DIAGNOSIS — E1122 Type 2 diabetes mellitus with diabetic chronic kidney disease: Secondary | ICD-10-CM | POA: Diagnosis not present

## 2020-03-14 DIAGNOSIS — Z794 Long term (current) use of insulin: Secondary | ICD-10-CM | POA: Diagnosis not present

## 2020-03-14 DIAGNOSIS — E890 Postprocedural hypothyroidism: Secondary | ICD-10-CM

## 2020-03-14 DIAGNOSIS — I129 Hypertensive chronic kidney disease with stage 1 through stage 4 chronic kidney disease, or unspecified chronic kidney disease: Secondary | ICD-10-CM | POA: Insufficient documentation

## 2020-03-14 DIAGNOSIS — Z7901 Long term (current) use of anticoagulants: Secondary | ICD-10-CM | POA: Insufficient documentation

## 2020-03-14 DIAGNOSIS — K219 Gastro-esophageal reflux disease without esophagitis: Secondary | ICD-10-CM | POA: Diagnosis not present

## 2020-03-14 DIAGNOSIS — C73 Malignant neoplasm of thyroid gland: Principal | ICD-10-CM | POA: Insufficient documentation

## 2020-03-14 DIAGNOSIS — K589 Irritable bowel syndrome without diarrhea: Secondary | ICD-10-CM | POA: Diagnosis not present

## 2020-03-14 DIAGNOSIS — E042 Nontoxic multinodular goiter: Secondary | ICD-10-CM | POA: Diagnosis present

## 2020-03-14 DIAGNOSIS — Z9889 Other specified postprocedural states: Secondary | ICD-10-CM

## 2020-03-14 DIAGNOSIS — E1142 Type 2 diabetes mellitus with diabetic polyneuropathy: Secondary | ICD-10-CM | POA: Insufficient documentation

## 2020-03-14 DIAGNOSIS — Z951 Presence of aortocoronary bypass graft: Secondary | ICD-10-CM | POA: Insufficient documentation

## 2020-03-14 DIAGNOSIS — Z79899 Other long term (current) drug therapy: Secondary | ICD-10-CM | POA: Insufficient documentation

## 2020-03-14 DIAGNOSIS — Z9861 Coronary angioplasty status: Secondary | ICD-10-CM | POA: Insufficient documentation

## 2020-03-14 DIAGNOSIS — D509 Iron deficiency anemia, unspecified: Secondary | ICD-10-CM | POA: Insufficient documentation

## 2020-03-14 DIAGNOSIS — I25118 Atherosclerotic heart disease of native coronary artery with other forms of angina pectoris: Secondary | ICD-10-CM | POA: Diagnosis not present

## 2020-03-14 DIAGNOSIS — N183 Chronic kidney disease, stage 3 unspecified: Secondary | ICD-10-CM | POA: Insufficient documentation

## 2020-03-14 HISTORY — PX: TOTAL THYROIDECTOMY: SHX2547

## 2020-03-14 HISTORY — DX: Postprocedural hypothyroidism: E89.0

## 2020-03-14 LAB — GLUCOSE, CAPILLARY
Glucose-Capillary: 113 mg/dL — ABNORMAL HIGH (ref 70–99)
Glucose-Capillary: 111 mg/dL — ABNORMAL HIGH (ref 70–99)
Glucose-Capillary: 148 mg/dL — ABNORMAL HIGH (ref 70–99)
Glucose-Capillary: 156 mg/dL — ABNORMAL HIGH (ref 70–99)

## 2020-03-14 LAB — ALBUMIN: Albumin: 4.2 g/dL (ref 3.5–5.0)

## 2020-03-14 LAB — CALCIUM: Calcium: 8.7 mg/dL — ABNORMAL LOW (ref 8.9–10.3)

## 2020-03-14 SURGERY — THYROIDECTOMY, COMPLETION
Anesthesia: General | Site: Neck

## 2020-03-14 MED ORDER — ONDANSETRON HCL 4 MG/2ML IJ SOLN: 4.0000 mg | Freq: Four times a day (QID) | INTRAMUSCULAR | Status: DC | PRN

## 2020-03-14 MED ORDER — LACTATED RINGERS IV SOLN: INTRAVENOUS | Status: DC | PRN

## 2020-03-14 MED ORDER — ADULT MULTIVITAMIN W/MINERALS CH
1.0000 | ORAL_TABLET | Freq: Every day | ORAL | Status: DC
  Administered 2020-03-14 – 2020-03-15 (×2): 1 via ORAL
  Filled 2020-03-14 (×3): qty 1

## 2020-03-14 MED ORDER — LEVOTHYROXINE SODIUM 150 MCG PO TABS
150.0000 ug | ORAL_TABLET | Freq: Every day | ORAL | Status: DC
  Administered 2020-03-15: 150 ug via ORAL
  Filled 2020-03-14: qty 1

## 2020-03-14 MED ORDER — FLUTICASONE PROPIONATE 50 MCG/ACT NA SUSP
2.0000 | Freq: Every day | NASAL | Status: DC | PRN
  Filled 2020-03-14: qty 16

## 2020-03-14 MED ORDER — DICYCLOMINE HCL 20 MG PO TABS
20.0000 mg | ORAL_TABLET | Freq: Two times a day (BID) | ORAL | Status: DC | PRN
  Filled 2020-03-14 (×3): qty 1

## 2020-03-14 MED ORDER — MIDAZOLAM HCL 2 MG/2ML IJ SOLN
INTRAMUSCULAR | Status: DC | PRN
  Administered 2020-03-14: 1 mg via INTRAVENOUS

## 2020-03-14 MED ORDER — CHLORHEXIDINE GLUCONATE CLOTH 2 % EX PADS
6.0000 | MEDICATED_PAD | Freq: Once | CUTANEOUS | Status: AC
  Administered 2020-03-14: 6 via TOPICAL

## 2020-03-14 MED ORDER — ONDANSETRON HCL 4 MG/2ML IJ SOLN: 4.0000 mg | Freq: Once | INTRAMUSCULAR | Status: DC | PRN

## 2020-03-14 MED ORDER — FENTANYL CITRATE (PF) 100 MCG/2ML IJ SOLN
25.0000 ug | INTRAMUSCULAR | Status: DC | PRN
  Administered 2020-03-14 (×3): 25 ug via INTRAVENOUS

## 2020-03-14 MED ORDER — MAGNESIUM GLUCONATE 500 MG PO TABS
250.0000 mg | ORAL_TABLET | Freq: Every day | ORAL | Status: DC
  Filled 2020-03-14: qty 1

## 2020-03-14 MED ORDER — SIMETHICONE 80 MG PO CHEW: 40.0000 mg | CHEWABLE_TABLET | Freq: Four times a day (QID) | ORAL | Status: DC | PRN

## 2020-03-14 MED ORDER — MIDAZOLAM HCL 2 MG/2ML IJ SOLN
INTRAMUSCULAR | Status: AC
  Filled 2020-03-14: qty 2

## 2020-03-14 MED ORDER — IRBESARTAN 150 MG PO TABS
300.0000 mg | ORAL_TABLET | Freq: Every day | ORAL | Status: DC
  Administered 2020-03-14 – 2020-03-15 (×2): 300 mg via ORAL
  Filled 2020-03-14 (×2): qty 2

## 2020-03-14 MED ORDER — REMIFENTANIL HCL 1 MG IV SOLR
INTRAVENOUS | Status: DC | PRN
  Administered 2020-03-14: .1 ug/kg/min via INTRAVENOUS

## 2020-03-14 MED ORDER — ACETAMINOPHEN 500 MG PO TABS
1000.0000 mg | ORAL_TABLET | Freq: Four times a day (QID) | ORAL | Status: DC
  Administered 2020-03-14 – 2020-03-15 (×3): 1000 mg via ORAL
  Filled 2020-03-14 (×3): qty 2

## 2020-03-14 MED ORDER — PRAVASTATIN SODIUM 20 MG PO TABS
20.0000 mg | ORAL_TABLET | Freq: Every day | ORAL | Status: DC
  Administered 2020-03-15: 20 mg via ORAL
  Filled 2020-03-14: qty 1

## 2020-03-14 MED ORDER — PROMETHAZINE HCL 25 MG PO TABS: 12.5000 mg | ORAL_TABLET | Freq: Four times a day (QID) | ORAL | Status: DC | PRN

## 2020-03-14 MED ORDER — PANTOPRAZOLE SODIUM 40 MG PO TBEC
40.0000 mg | DELAYED_RELEASE_TABLET | Freq: Every day | ORAL | Status: DC
  Administered 2020-03-15: 40 mg via ORAL
  Filled 2020-03-14: qty 1

## 2020-03-14 MED ORDER — SPIRONOLACTONE 25 MG PO TABS
50.0000 mg | ORAL_TABLET | Freq: Every day | ORAL | Status: DC
  Administered 2020-03-14 – 2020-03-15 (×2): 50 mg via ORAL
  Filled 2020-03-14 (×2): qty 2

## 2020-03-14 MED ORDER — CHLORHEXIDINE GLUCONATE CLOTH 2 % EX PADS
6.0000 | MEDICATED_PAD | Freq: Once | CUTANEOUS | Status: AC
  Administered 2020-03-14: 08:00:00 6 via TOPICAL

## 2020-03-14 MED ORDER — SODIUM CHLORIDE 0.9 % IV SOLN
INTRAVENOUS | Status: DC
  Administered 2020-03-14: 100 mL/h via INTRAVENOUS

## 2020-03-14 MED ORDER — INSULIN PUMP
Freq: Three times a day (TID) | SUBCUTANEOUS | Status: DC
  Administered 2020-03-14 – 2020-03-15 (×2): 7 via SUBCUTANEOUS
  Administered 2020-03-15: 09:00:00 4 via SUBCUTANEOUS
  Filled 2020-03-14: qty 1

## 2020-03-14 MED ORDER — ONDANSETRON HCL 4 MG/2ML IJ SOLN
INTRAMUSCULAR | Status: DC | PRN
  Administered 2020-03-14: 4 mg via INTRAVENOUS

## 2020-03-14 MED ORDER — REMIFENTANIL HCL 1 MG IV SOLR
INTRAVENOUS | Status: AC
  Filled 2020-03-14: qty 1000

## 2020-03-14 MED ORDER — METFORMIN HCL ER 500 MG PO TB24: 500.0000 mg | ORAL_TABLET | ORAL | Status: DC

## 2020-03-14 MED ORDER — "VISTASEAL 4 ML SINGLE DOSE KIT "
PACK | CUTANEOUS | Status: DC | PRN
  Administered 2020-03-14: 4 mL via TOPICAL

## 2020-03-14 MED ORDER — LIDOCAINE HCL (PF) 2 % IJ SOLN
INTRAMUSCULAR | Status: AC
  Filled 2020-03-14: qty 5

## 2020-03-14 MED ORDER — PROPOFOL 10 MG/ML IV BOLUS
INTRAVENOUS | Status: AC
  Filled 2020-03-14: qty 20

## 2020-03-14 MED ORDER — CELECOXIB 200 MG PO CAPS: 200.0000 mg | ORAL_CAPSULE | ORAL | Status: AC

## 2020-03-14 MED ORDER — CLOTRIMAZOLE 1 % EX CREA
1.0000 "application " | TOPICAL_CREAM | Freq: Two times a day (BID) | CUTANEOUS | Status: DC | PRN
  Filled 2020-03-14: qty 15

## 2020-03-14 MED ORDER — CALCIUM CARBONATE ANTACID 500 MG PO CHEW
1000.0000 mg | CHEWABLE_TABLET | Freq: Three times a day (TID) | ORAL | Status: DC
  Administered 2020-03-14 – 2020-03-15 (×2): 1000 mg via ORAL
  Filled 2020-03-14 (×2): qty 5

## 2020-03-14 MED ORDER — ONDANSETRON 4 MG PO TBDP: 4.0000 mg | ORAL_TABLET | Freq: Four times a day (QID) | ORAL | Status: DC | PRN

## 2020-03-14 MED ORDER — ACETAMINOPHEN 500 MG PO TABS
ORAL_TABLET | ORAL | Status: AC
  Administered 2020-03-14: 1000 mg via ORAL
  Filled 2020-03-14: qty 2

## 2020-03-14 MED ORDER — EPHEDRINE SULFATE 50 MG/ML IJ SOLN
INTRAMUSCULAR | Status: DC | PRN
  Administered 2020-03-14 (×2): 10 mg via INTRAVENOUS

## 2020-03-14 MED ORDER — METFORMIN HCL ER 500 MG PO TB24
1000.0000 mg | ORAL_TABLET | Freq: Every day | ORAL | Status: DC
  Administered 2020-03-14: 16:00:00 1000 mg via ORAL
  Filled 2020-03-14 (×2): qty 2

## 2020-03-14 MED ORDER — ACETAMINOPHEN 500 MG PO TABS: 1000.0000 mg | ORAL_TABLET | ORAL | Status: AC

## 2020-03-14 MED ORDER — FENTANYL CITRATE (PF) 100 MCG/2ML IJ SOLN
INTRAMUSCULAR | Status: AC
  Filled 2020-03-14: qty 2

## 2020-03-14 MED ORDER — INSULIN ASPART (W/NIACINAMIDE) 100 UNIT/ML ~~LOC~~ SOLN
70.0000 [IU] | SUBCUTANEOUS | Status: DC
  Filled 2020-03-14: qty 10

## 2020-03-14 MED ORDER — TRAMADOL HCL 50 MG PO TABS
50.0000 mg | ORAL_TABLET | Freq: Four times a day (QID) | ORAL | Status: DC | PRN
  Administered 2020-03-14 – 2020-03-15 (×2): 50 mg via ORAL
  Filled 2020-03-14 (×2): qty 1

## 2020-03-14 MED ORDER — GABAPENTIN 300 MG PO CAPS: 300.0000 mg | ORAL_CAPSULE | ORAL | Status: AC

## 2020-03-14 MED ORDER — CELECOXIB 200 MG PO CAPS
ORAL_CAPSULE | ORAL | Status: AC
  Administered 2020-03-14: 200 mg via ORAL
  Filled 2020-03-14: qty 1

## 2020-03-14 MED ORDER — MAGNESIUM OXIDE 400 (241.3 MG) MG PO TABS
200.0000 mg | ORAL_TABLET | Freq: Every day | ORAL | Status: DC
  Administered 2020-03-14 – 2020-03-15 (×2): 200 mg via ORAL
  Filled 2020-03-14 (×2): qty 1

## 2020-03-14 MED ORDER — ROCURONIUM BROMIDE 100 MG/10ML IV SOLN
INTRAVENOUS | Status: DC | PRN
  Administered 2020-03-14: 5 mg via INTRAVENOUS

## 2020-03-14 MED ORDER — GABAPENTIN 300 MG PO CAPS
300.0000 mg | ORAL_CAPSULE | Freq: Every day | ORAL | Status: DC
  Administered 2020-03-15: 300 mg via ORAL
  Filled 2020-03-14: qty 1

## 2020-03-14 MED ORDER — SODIUM CHLORIDE 0.9 % IV SOLN: INTRAVENOUS | Status: DC

## 2020-03-14 MED ORDER — DEXAMETHASONE SODIUM PHOSPHATE 10 MG/ML IJ SOLN
INTRAMUSCULAR | Status: DC | PRN
  Administered 2020-03-14: 4 mg via INTRAVENOUS

## 2020-03-14 MED ORDER — METFORMIN HCL ER 500 MG PO TB24
500.0000 mg | ORAL_TABLET | Freq: Every day | ORAL | Status: DC
  Administered 2020-03-15: 500 mg via ORAL
  Filled 2020-03-14: qty 1

## 2020-03-14 MED ORDER — FUROSEMIDE 40 MG PO TABS
40.0000 mg | ORAL_TABLET | Freq: Every day | ORAL | Status: DC
  Administered 2020-03-14 – 2020-03-15 (×2): 40 mg via ORAL
  Filled 2020-03-14 (×2): qty 1

## 2020-03-14 MED ORDER — MENTHOL 3 MG MT LOZG
1.0000 | LOZENGE | OROMUCOSAL | Status: DC | PRN
  Administered 2020-03-15: 08:00:00 3 mg via ORAL
  Filled 2020-03-14 (×2): qty 9

## 2020-03-14 MED ORDER — SUCCINYLCHOLINE CHLORIDE 20 MG/ML IJ SOLN
INTRAMUSCULAR | Status: DC | PRN
  Administered 2020-03-14: 120 mg via INTRAVENOUS

## 2020-03-14 MED ORDER — FENTANYL CITRATE (PF) 100 MCG/2ML IJ SOLN
INTRAMUSCULAR | Status: DC | PRN
  Administered 2020-03-14 (×2): 25 ug via INTRAVENOUS
  Administered 2020-03-14: 50 ug via INTRAVENOUS

## 2020-03-14 MED ORDER — PROPOFOL 10 MG/ML IV BOLUS
INTRAVENOUS | Status: DC | PRN
Start: 2020-03-14 — End: 2020-03-14
  Administered 2020-03-14: 170 mg via INTRAVENOUS

## 2020-03-14 MED ORDER — FENTANYL CITRATE (PF) 100 MCG/2ML IJ SOLN
INTRAMUSCULAR | Status: AC
  Administered 2020-03-14: 13:00:00 25 ug via INTRAVENOUS
  Filled 2020-03-14: qty 2

## 2020-03-14 MED ORDER — SUCCINYLCHOLINE CHLORIDE 200 MG/10ML IV SOSY
PREFILLED_SYRINGE | INTRAVENOUS | Status: AC
  Filled 2020-03-14: qty 10

## 2020-03-14 MED ORDER — IRON-VITAMIN C 65-125 MG PO TABS: ORAL_TABLET | Freq: Every day | ORAL | Status: DC

## 2020-03-14 MED ORDER — ASPIRIN EC 81 MG PO TBEC
81.0000 mg | DELAYED_RELEASE_TABLET | Freq: Every day | ORAL | Status: DC
  Administered 2020-03-14 – 2020-03-15 (×2): 81 mg via ORAL
  Filled 2020-03-14 (×2): qty 1

## 2020-03-14 MED ORDER — SEVOFLURANE IN SOLN
RESPIRATORY_TRACT | Status: AC
  Filled 2020-03-14: qty 250

## 2020-03-14 MED ORDER — LEVOTHYROXINE SODIUM 150 MCG PO TABS: 150.0000 ug | ORAL_TABLET | Freq: Every day | ORAL | 3 refills | Status: DC

## 2020-03-14 MED ORDER — HEMOSTATIC AGENTS (NO CHARGE) OPTIME
TOPICAL | Status: DC | PRN
  Administered 2020-03-14: 2 via TOPICAL

## 2020-03-14 MED ORDER — OSCAL 500/200 D-3 500-200 MG-UNIT PO TABS: 2.0000 | ORAL_TABLET | Freq: Three times a day (TID) | ORAL | 1 refills | Status: DC

## 2020-03-14 MED ORDER — GABAPENTIN 300 MG PO CAPS
ORAL_CAPSULE | ORAL | Status: AC
  Administered 2020-03-14: 300 mg via ORAL
  Filled 2020-03-14: qty 1

## 2020-03-14 MED ORDER — AMLODIPINE BESYLATE 5 MG PO TABS
5.0000 mg | ORAL_TABLET | Freq: Every day | ORAL | Status: DC
  Administered 2020-03-15: 5 mg via ORAL
  Filled 2020-03-14: qty 1

## 2020-03-14 MED ORDER — SOTALOL HCL 80 MG PO TABS
80.0000 mg | ORAL_TABLET | Freq: Two times a day (BID) | ORAL | Status: DC
  Administered 2020-03-14 – 2020-03-15 (×2): 80 mg via ORAL
  Filled 2020-03-14 (×3): qty 1

## 2020-03-14 MED ORDER — LIDOCAINE HCL (CARDIAC) PF 100 MG/5ML IV SOSY
PREFILLED_SYRINGE | INTRAVENOUS | Status: DC | PRN
  Administered 2020-03-14: 100 mg via INTRATRACHEAL

## 2020-03-14 SURGICAL SUPPLY — 46 items
BASIN GRAD PLASTIC 32OZ STRL (MISCELLANEOUS) ×2 IMPLANT
BLADE SURG 15 STRL LF DISP TIS (BLADE) ×1 IMPLANT
BLADE SURG 15 STRL SS (BLADE) ×1
CANISTER SUCT 1200ML W/VALVE (MISCELLANEOUS) ×2 IMPLANT
CLIP VESOCCLUDE SM WIDE 6/CT (CLIP) ×2 IMPLANT
COVER WAND RF STERILE (DRAPES) ×2 IMPLANT
DERMABOND ADVANCED (GAUZE/BANDAGES/DRESSINGS) ×1
DERMABOND ADVANCED .7 DNX12 (GAUZE/BANDAGES/DRESSINGS) ×1 IMPLANT
DRAPE 3/4 80X56 (DRAPES) ×1 IMPLANT
DRAPE MAG INST 16X20 L/F (DRAPES) ×2 IMPLANT
DRAPE THYROID T SHEET (DRAPES) ×2 IMPLANT
ELECT CAUTERY BLADE TIP 2.5 (TIP) ×2
ELECT LARYNGEAL DUAL CHAN (ELECTRODE) ×1 IMPLANT
ELECT NEEDLE 20X.3 GREEN (MISCELLANEOUS) ×2
ELECT REM PT RETURN 9FT ADLT (ELECTROSURGICAL) ×2
ELECTRODE CAUTERY BLDE TIP 2.5 (TIP) ×1 IMPLANT
ELECTRODE NDL 20X.3 GREEN (MISCELLANEOUS) IMPLANT
ELECTRODE NEEDLE 20X.3 GREEN (MISCELLANEOUS) ×1 IMPLANT
ELECTRODE REM PT RTRN 9FT ADLT (ELECTROSURGICAL) ×1 IMPLANT
GAUZE 4X4 16PLY RFD (DISPOSABLE) ×2 IMPLANT
GLOVE BIO SURGEON STRL SZ 6.5 (GLOVE) ×4 IMPLANT
GLOVE INDICATOR 7.0 STRL GRN (GLOVE) ×2 IMPLANT
GOWN STRL REUS W/ TWL LRG LVL3 (GOWN DISPOSABLE) ×2 IMPLANT
GOWN STRL REUS W/TWL LRG LVL3 (GOWN DISPOSABLE) ×2
HEMOSTAT SNOW SURGICEL 2X4 (HEMOSTASIS) ×3 IMPLANT
KIT TURNOVER KIT A (KITS) ×2 IMPLANT
LABEL OR SOLS (LABEL) ×2 IMPLANT
LIGHT WAVEGUIDE WIDE FLAT (MISCELLANEOUS) ×1 IMPLANT
NERVE STIMULATOR WAVEFORM 16S (NEUROSURGERY SUPPLIES)
NS IRRIG 500ML POUR BTL (IV SOLUTION) ×2 IMPLANT
PACK BASIN MINOR (MISCELLANEOUS) ×2 IMPLANT
PROBE NEUROSIGN BIPOL (MISCELLANEOUS) IMPLANT
PROBE NEUROSIGN BIPOLAR (MISCELLANEOUS) ×1
SCRUB CHG 4% DYNA-HEX 4OZ (MISCELLANEOUS) ×2 IMPLANT
SET WALTER ACTIVATION W/DRAPE (SET/KITS/TRAYS/PACK) ×3 IMPLANT
SHEARS HARMONIC 9CM CVD (BLADE) ×1 IMPLANT
SPONGE KITTNER 5P (MISCELLANEOUS) ×2 IMPLANT
STIMULATOR NERVE WAVEFORM 16S (NEUROSURGERY SUPPLIES) IMPLANT
STRIP CLOSURE SKIN 1/2X4 (GAUZE/BANDAGES/DRESSINGS) ×2 IMPLANT
SUT MNCRL AB 4-0 PS2 18 (SUTURE) ×1 IMPLANT
SUT PROLENE 4 0 PS 2 18 (SUTURE) ×2 IMPLANT
SUT SILK 2 0 (SUTURE) ×2
SUT SILK 2-0 18XBRD TIE 12 (SUTURE) ×1 IMPLANT
SUT VIC AB 4-0 RB1 27 (SUTURE) ×1
SUT VIC AB 4-0 RB1 27X BRD (SUTURE) ×1 IMPLANT
SYR BULB IRRIG 60ML STRL (SYRINGE) ×2 IMPLANT

## 2020-03-14 NOTE — Transfer of Care (Signed)
Immediate Anesthesia Transfer of Care Note  Patient: Hailey Howard  Procedure(s) Performed: TOTAL THYROIDECTOMY (N/A Neck)  Patient Location: PACU  Anesthesia Type:General  Level of Consciousness: awake  Airway & Oxygen Therapy: Patient Spontanous Breathing and Patient connected to face mask oxygen  Post-op Assessment: Report given to RN and Post -op Vital signs reviewed and stable  Post vital signs: Reviewed and stable  Last Vitals:  Vitals Value Taken Time  BP 164/69 03/14/20 1305  Temp 36.6 C 03/14/20 1305  Pulse 69 03/14/20 1307  Resp 13 03/14/20 1307  SpO2 100 % 03/14/20 1307  Vitals shown include unvalidated device data.  Last Pain:  Vitals:   03/14/20 0802  TempSrc: Tympanic  PainSc: 0-No pain         Complications: No apparent anesthesia complications

## 2020-03-14 NOTE — Interval H&P Note (Signed)
History and Physical Interval Note:  03/14/2020 9:34 AM  Hailey Howard  has presented today for surgery, with the diagnosis of multinodular goiter.  The various methods of treatment have been discussed with the patient and family. After consideration of risks, benefits and other options for treatment, the patient has consented to  Procedure(s): TOTAL THYROIDECTOMY (N/A) as a surgical intervention.  The patient's history has been reviewed, patient examined, no change in status, stable for surgery.  I have reviewed the patient's chart and labs.  Questions were answered to the patient's satisfaction.     Fredirick Maudlin

## 2020-03-14 NOTE — Anesthesia Preprocedure Evaluation (Signed)
Anesthesia Evaluation  Patient identified by MRN, date of birth, ID band Patient awake    Reviewed: Allergy & Precautions, NPO status , Patient's Chart, lab work & pertinent test results  History of Anesthesia Complications Negative for: history of anesthetic complications  Airway Mallampati: III       Dental   Pulmonary neg sleep apnea, neg COPD, Not current smoker,           Cardiovascular hypertension, Pt. on medications + CAD and + CABG  + dysrhythmias Atrial Fibrillation (-) Valvular Problems/Murmurs     Neuro/Psych neg Seizures    GI/Hepatic Neg liver ROS, hiatal hernia, GERD  Medicated and Controlled,  Endo/Other  diabetes, Type 2, Oral Hypoglycemic Agents  Renal/GU Renal InsufficiencyRenal disease     Musculoskeletal   Abdominal   Peds  Hematology  (+) anemia ,   Anesthesia Other Findings   Reproductive/Obstetrics                             Anesthesia Physical Anesthesia Plan  ASA: III  Anesthesia Plan: General   Post-op Pain Management:    Induction: Intravenous  PONV Risk Score and Plan: 3 and Ondansetron, Dexamethasone and Midazolam  Airway Management Planned: Oral ETT  Additional Equipment:   Intra-op Plan:   Post-operative Plan:   Informed Consent: I have reviewed the patients History and Physical, chart, labs and discussed the procedure including the risks, benefits and alternatives for the proposed anesthesia with the patient or authorized representative who has indicated his/her understanding and acceptance.       Plan Discussed with:   Anesthesia Plan Comments:         Anesthesia Quick Evaluation

## 2020-03-14 NOTE — Plan of Care (Signed)
Continuing with plan of care. 

## 2020-03-14 NOTE — Anesthesia Procedure Notes (Signed)
Procedure Name: Intubation Date/Time: 03/14/2020 9:56 AM Performed by: Babs Sciara, CRNA Pre-anesthesia Checklist: Patient identified, Emergency Drugs available, Suction available, Patient being monitored and Timeout performed Patient Re-evaluated:Patient Re-evaluated prior to induction Oxygen Delivery Method: Circle system utilized Preoxygenation: Pre-oxygenation with 100% oxygen Induction Type: IV induction Ventilation: Mask ventilation without difficulty Laryngoscope Size: McGraph and 3 Grade View: Grade I Tube type: Oral (nerve monitoring ETT) Tube size: 7.0 mm Number of attempts: 1 Airway Equipment and Method: Stylet and Video-laryngoscopy (surgeon directed placement of neuromonitoring ETT) Placement Confirmation: positive ETCO2 and breath sounds checked- equal and bilateral Secured at: 18 (lip) cm Tube secured with: Tape Dental Injury: Teeth and Oropharynx as per pre-operative assessment

## 2020-03-14 NOTE — Discharge Instructions (Signed)

## 2020-03-14 NOTE — Op Note (Signed)
Operative Note  Preoperative Diagnosis:  a multinodular goiter  Postoperative Diagnosis:  a multinodular goiter  Operation:  Completion Thyroidectomy  Surgeon: Fredirick Maudlin, MD  Assistant: Ronny Bacon, MD (a second surgeon was necessary for both exposure and for technical assistance with a complex procedure)  Anesthesia: General endotracheal with nerve monitoring system  Findings: There was extensive scar in the midline and it appeared that the previous surgeon had not reapproximated the vertical strap muscles.  Once we were able to get past the scar centrally, the planes were relatively free.  The goiter was extensive and wraparound posterior to the trachea bilaterally.  Both recurrent laryngeal nerves were well visualized and gave good functional signals throughout the case.  No parathyroid tissue was appreciated in situ, however upon detailed examination of the surgical specimen.  No parathyroid tissue was seen.  Indications: Hailey Howard is a 64 year old woman who underwent some type of partial thyroidectomy about 10 years ago.  She was not sure what had been removed, but had continued dysphagia and compressive symptoms.  Ultrasound imaging suggested a large multinodular goiter with both lobes intact.  Biopsies were performed of the more concerning nodules and all were benign.  Due to her compressive symptoms, however, she was referred for surgical evaluation.  Total completion thyroidectomy was recommended.  The risks of the procedure were discussed with her and she agreed to proceed  Procedure In Detail: The patient was identified in the preoperative holding area and brought to the operating room where she was placed supine on the OR table.  All bony prominences were padded and bilateral sequential compression devices were placed on the lower extremities.  General endotracheal anesthesia was induced using the nerve monitoring system.  Tube placement was verified with the McGrath  laryngoscope.  The grounding lead was placed and the patient was positioned appropriately for the operation.  She was sterilely prepped and draped in standard fashion.  A timeout was performed confirming the patient's identity, the procedure being performed, her allergies, all necessary equipment was available, and that maintenance anesthesia was adequate.  The patient's prior incision was too low on her neck for her purposes and thus a new transverse scar was made in a natural skin fold that was appropriately positioned for the operation.  This was carried down through the subcutaneous tissue and platysma using electrocautery.  Subplatysmal flaps were elevated.  There was a modest amount of scar tissue appreciated in this plane.  As we visualized where the median raphae should have been, it was apparent that the prior surgeon had not closed the strap muscles.  The vertical strap muscles were densely adherent to the thyroid parenchyma.  Careful blunt dissection, along with judicious use of electrocautery and the harmonic scalpel was used to free the scarred down strap muscles from the thyroid tissue.  Once we had done this, the more lateral planes were free.  As the bulk of her thyroid was on the right, we began our resection there.  The strap muscles, having been elevated off of the thyroid tissue were retracted laterally.  We then isolated and divided the superior pole vessels with silk ties and the harmonic scalpel.  In order to get good exposure, the omohyoid muscle was partially divided.  As we divided the superior pole vessels, no superior parathyroid gland was identified.  The middle thyroid vein was divided with silk ties and the harmonic scalpel.  The thyroid was then rotated medially.  I dissected into the lateral fibrofatty tissues of  the central neck and identified the recurrent laryngeal nerve.  It gave an excellent functional signal when stimulated.  The trachea was exposed medial to the nerve and  the inferior pole vessels were divided.  Once again, no discrete parathyroid tissue was identified.  The nerve was dissected up to its insertion point at the cricothyroid muscle.  The ligament of Berry and anterior suspensory ligament were divided.  The gland was dissected off of the trachea and across the midline.  There was a substantial foraminal lobe that was dissected away from the underlying musculature.  It was divided with the harmonic scalpel at its most cranial aspect.  We then turned our attention to the left.  The midline scarring was a bit more extensive on this side, but as had been found on the right, once we passed the midline, the tissue planes were free.  We again retracted the strap muscles laterally.  We isolated and divided the superior pole vessels with silk ties and the harmonic scalpel.  Once again, no discrete parathyroid tissue was appreciated.  The thyroid was then rotated medially.  The tracheoesophageal groove was opened and the recurrent laryngeal nerve identified therein.  It gave a good signal when stimulated.  The trachea was exposed medial to the nerve and the inferior pole vessels were divided.  No obvious parathyroid tissue was appreciated in this location either.  The nerve was dissected up to its insertion point at the cricothyroid muscle.  The remaining attachments of the thyroid to the trachea were divided, and the gland was completely excised.  It was carefully examined on all surfaces for any parathyroid tissue.  None was seen.  The gland was handed off as a specimen.  We then irrigated our wound beds and obtained good hemostasis.  A Valsalva maneuver from the anesthesia team confirmed no ongoing surgical bleeding.  Due to moderate raw surface oozing, I applied SNoW and Vistaseal to the wound beds.  The strap muscles were then closed in the midline with running 4-0 Vicryl.  The platysma was closed with interrupted Vicryl.  The skin was closed with running subcuticular  Monocryl.  The skin was cleaned and Dermabond and Steri-Strips were applied.  The Monocryl was trimmed at the skin surface.  The patient was awakened, extubated, and taken to the postanesthesia care unit in good condition.   EBL: 50 cc  IVF: See anesthesia record  Specimen(s): Total thyroid to pathology for permanent evaluation  Complications: none immediately apparent.   Counts: all needles, instruments, and sponges were counted and reported to be correct in number at the end of the case.   I was present for and participated in the entire operation.  Fredirick Maudlin 1:27 PM

## 2020-03-14 NOTE — Anesthesia Postprocedure Evaluation (Signed)
Anesthesia Post Note  Patient: Hailey Howard  Procedure(s) Performed: TOTAL THYROIDECTOMY (N/A Neck)  Patient location during evaluation: PACU Anesthesia Type: General Level of consciousness: awake and alert and oriented Pain management: pain level controlled Vital Signs Assessment: post-procedure vital signs reviewed and stable Respiratory status: spontaneous breathing, nonlabored ventilation and respiratory function stable Cardiovascular status: blood pressure returned to baseline and stable Postop Assessment: no signs of nausea or vomiting Anesthetic complications: no     Last Vitals:  Vitals:   03/14/20 1335 03/14/20 1345  BP: (!) 166/59 (!) 170/69  Pulse: 69 69  Resp: (!) 7 12  Temp:    SpO2: 100% 100%    Last Pain:  Vitals:   03/14/20 1333  TempSrc:   PainSc: 5                  Krisann Mckenna

## 2020-03-15 DIAGNOSIS — C73 Malignant neoplasm of thyroid gland: Secondary | ICD-10-CM | POA: Diagnosis not present

## 2020-03-15 LAB — GLUCOSE, CAPILLARY
Glucose-Capillary: 98 mg/dL (ref 70–99)
Glucose-Capillary: 96 mg/dL (ref 70–99)

## 2020-03-15 LAB — ALBUMIN: Albumin: 3.8 g/dL (ref 3.5–5.0)

## 2020-03-15 LAB — CALCIUM: Calcium: 8.8 mg/dL — ABNORMAL LOW (ref 8.9–10.3)

## 2020-03-15 MED ORDER — ACETAMINOPHEN 325 MG PO TABS: 650.0000 mg | ORAL_TABLET | Freq: Three times a day (TID) | ORAL | 0 refills | Status: AC | PRN

## 2020-03-15 MED ORDER — FE FUMARATE-B12-VIT C-FA-IFC PO CAPS
1.0000 | ORAL_CAPSULE | Freq: Every day | ORAL | Status: DC
  Administered 2020-03-15: 1 via ORAL
  Filled 2020-03-15: qty 1

## 2020-03-15 MED ORDER — DOCUSATE SODIUM 100 MG PO CAPS
100.0000 mg | ORAL_CAPSULE | Freq: Two times a day (BID) | ORAL | 0 refills | Status: AC | PRN
Start: 2020-03-15 — End: 2020-03-25

## 2020-03-15 NOTE — Plan of Care (Signed)
Continuing with plan of care. 

## 2020-03-15 NOTE — Plan of Care (Signed)
Discharge teaching completed with patient who verbalized understanding of teaching, follow-up appointments, and medications.  Patient is in stable condition and has all belongings.

## 2020-03-15 NOTE — Care Management Obs Status (Signed)
[facility] NOTIFICATION   Patient Details  Name: LARIYA KINZIE MRN: [id number] Date of Birth: 01-12-1956   Medicare Observation Status Notification Given:  No(<24 hours)    Marshell Garfinkel, RN 03/15/2020, 10:32 AM

## 2020-03-15 NOTE — Discharge Summary (Signed)
Physician Discharge Summary  Patient ID: Hailey Howard MRN: [id number] DOB/AGE: 11-28-1955 64 y.o.  Admit date: 03/14/2020 Discharge date: 03/15/2020  Admission Diagnoses: multinodular goiter   Discharge Diagnoses:  Same as above  Discharged Condition: good  Hospital Course: admitted for above and elective total thyroidectomy.  See op note for details.  Uneventful recovery with advancement of diet and pain controlled, Ca levels stable at time of discharge.  Pt denies any issues swallowing, or significant changes in her voice  Consults: None  Discharge Exam: Blood pressure (!) 161/61, pulse 70, temperature 98.3 F (36.8 C), resp. rate 20, height _0  (1.702 m), weight 108 kg, SpO2 97 %. General appearance: alert, cooperative and no distress Skin: throat incision c/d/i.  no swelling, erythema, TTP  Throat:  Mild hoarseness noted with speech, but patient had no specific complaints.  Disposition:  Discharge disposition: 01-Home or Self Care       Discharge Instructions    Discharge patient   Complete by: As directed    Discharge disposition: 01-Home or Self Care   Discharge patient date: 03/15/2020     Allergies as of 03/15/2020      Reactions   Other Anaphylaxis, Hives, Itching   Iv dye  Confirmed with pt over the phone on 08/01/15   Iodinated Diagnostic Agents Hives   Oxycodone-acetaminophen Hives   Etodolac Itching      Medication List    TAKE these medications   Accu-Chek Guide w/Device Kit   Accu-Chek Softclix Lancets lancets   acetaminophen 325 MG tablet Commonly known as: Tylenol Take 2 tablets (650 mg total) by mouth every 8 (eight) hours as needed for mild pain.   amLODipine 5 MG tablet Commonly known as: NORVASC Take 5 mg by mouth daily.   aspirin EC 81 MG tablet Take 81 mg by mouth daily. Notes to patient: RESUME 72HRS AFTER SURGERY   clotrimazole 1 % cream Commonly known as: LOTRIMIN Apply 1 application topically 2 (two) times daily as  needed (irritation).   Contour Next Test test strip Generic drug: glucose blood   dicyclomine 20 MG tablet Commonly known as: BENTYL Take 20 mg by mouth 2 (two) times daily as needed for spasms.   docusate sodium 100 MG capsule Commonly known as: Colace Take 1 capsule (100 mg total) by mouth 2 (two) times daily as needed for up to 10 days for mild constipation.   Fiasp 100 UNIT/ML Soln Generic drug: Insulin Aspart (w/Niacinamide) Inject 70 Units into the skin continuous. Via Insulin pump   fluticasone 50 MCG/ACT nasal spray Commonly known as: FLONASE Place 2 sprays into both nostrils daily as needed for allergies.   furosemide 40 MG tablet Commonly known as: LASIX Take 40 mg by mouth daily.   gabapentin 300 MG capsule Commonly known as: NEURONTIN Take 300 mg by mouth daily.   levothyroxine 150 MCG tablet Commonly known as: SYNTHROID Take 1 tablet (150 mcg total) by mouth daily at 6 (six) AM.   Magnesium 250 MG Tabs Take 250 mg by mouth daily.   metFORMIN 500 MG 24 hr tablet Commonly known as: GLUCOPHAGE-XR Take 500-1,000 mg by mouth See admin instructions. Take 500 mg by mouth in the morning and 1000 mg at night   Multi-Vitamin tablet Take 1 tablet by mouth daily.   omeprazole 20 MG capsule Commonly known as: PRILOSEC Take 20 mg by mouth daily.   OmniPod Dash 5 Pack Pods Misc CHANGE POD Q 48 H UTD   Oscal 500/200 D-3  500-200 MG-UNIT tablet Generic drug: calcium-vitamin D Take 2 tablets by mouth 3 (three) times daily. Take 2 tablets threes times a day for 1 week ,and then 1 tablet three times a day the next to weeks   pravastatin 20 MG tablet Commonly known as: PRAVACHOL Take 20 mg by mouth daily.   promethazine 12.5 MG tablet Commonly known as: PHENERGAN Take 12.5 mg by mouth every 6 (six) hours as needed for nausea or vomiting.   sotalol 80 MG tablet Commonly known as: BETAPACE Take 80 mg by mouth 2 (two) times daily.   spironolactone 50 MG  tablet Commonly known as: ALDACTONE Take 50 mg by mouth daily.   telmisartan 80 MG tablet Commonly known as: MICARDIS Take 80 mg by mouth daily.   VITRON-C PO Take 1 tablet by mouth daily.      Follow-up Information    Fredirick Maudlin, MD. Schedule an appointment as soon as possible for a visit in 2 week(s).   Specialty: General Surgery Why: s/p total thyroidectomy Contact information: [address] [place] [address] [phone]            Total time spent arranging discharge was >53mn. Signed: IBenjamine Sprague4/24/2021, 1:14 PM

## 2020-03-17 ENCOUNTER — Inpatient Hospital Stay: Payer: Medicare HMO | Admitting: Internal Medicine

## 2020-03-17 ENCOUNTER — Inpatient Hospital Stay: Payer: Medicare HMO

## 2020-03-18 ENCOUNTER — Telehealth: Payer: Self-pay | Admitting: General Surgery

## 2020-03-18 NOTE — Telephone Encounter (Signed)
Patient called to make her post op visit.  She further states since surgery has been dizzy with headache and would like for someone to call her please.  Thank you.

## 2020-03-18 NOTE — Telephone Encounter (Signed)
Spoke with patient to notify her of Dr.Cannon's recommendations of "She needs to take her pain medicine as prescribed. She can alternate ibuprofen and Tylenol. She can use an ice pack on her neck. She's only a few days postop so this is not abnormal. It is also very normal to cough up a lot of phlegm, due to the endotracheal tube and the fact that I was operating around her trachea. She needs to make sure that she is moving her neck through a full range of motion, and she may use a heating pad on the back of her neck. The headaches often come when people try to hold their neck to still, and the muscles get stiff." Patient was also instructed to give our office a call if she doesn't noticed any changes. Patient verbalized understanding and has no further questions.

## 2020-03-19 LAB — SURGICAL PATHOLOGY

## 2020-03-20 ENCOUNTER — Telehealth: Payer: Self-pay | Admitting: General Surgery

## 2020-03-20 NOTE — Telephone Encounter (Signed)
Discussed pathology w/patient. Micro PTC found.  No additional treatment or specific follow up required.  Will see her in clinic next week.

## 2020-03-21 ENCOUNTER — Telehealth: Payer: Self-pay | Admitting: General Surgery

## 2020-03-21 NOTE — Telephone Encounter (Signed)
Patient is calling and is asking if something may be called into the pharmacy for her throat  said she has a bit of a sore throat. Please call patient and advise.

## 2020-03-21 NOTE — Telephone Encounter (Signed)
Message sent to Dr Celine Ahr about this.

## 2020-03-21 NOTE — Telephone Encounter (Signed)
Per Dr Celine Ahr patient can take over the counter remedies, like lozenges or spray, tea with honey and lemon. Pt made aware of this and knows she has to be patient with the healing process. Pt has no further concerns, and instructed to call the office if she has any questions or concerns.

## 2020-03-27 ENCOUNTER — Encounter: Payer: Self-pay | Admitting: Internal Medicine

## 2020-03-27 ENCOUNTER — Other Ambulatory Visit: Payer: Self-pay

## 2020-03-27 ENCOUNTER — Other Ambulatory Visit: Payer: Self-pay | Admitting: *Deleted

## 2020-03-27 DIAGNOSIS — D631 Anemia in chronic kidney disease: Secondary | ICD-10-CM

## 2020-03-28 ENCOUNTER — Inpatient Hospital Stay: Payer: Medicare HMO

## 2020-03-28 ENCOUNTER — Inpatient Hospital Stay (HOSPITAL_BASED_OUTPATIENT_CLINIC_OR_DEPARTMENT_OTHER): Payer: Medicare HMO | Admitting: Internal Medicine

## 2020-03-28 ENCOUNTER — Inpatient Hospital Stay: Payer: Medicare HMO | Attending: Internal Medicine

## 2020-03-28 VITALS — BP 143/79 | HR 67

## 2020-03-28 DIAGNOSIS — D472 Monoclonal gammopathy: Secondary | ICD-10-CM | POA: Insufficient documentation

## 2020-03-28 DIAGNOSIS — D631 Anemia in chronic kidney disease: Secondary | ICD-10-CM

## 2020-03-28 DIAGNOSIS — N183 Chronic kidney disease, stage 3 unspecified: Secondary | ICD-10-CM | POA: Diagnosis not present

## 2020-03-28 DIAGNOSIS — N184 Chronic kidney disease, stage 4 (severe): Secondary | ICD-10-CM | POA: Insufficient documentation

## 2020-03-28 DIAGNOSIS — I2510 Atherosclerotic heart disease of native coronary artery without angina pectoris: Secondary | ICD-10-CM | POA: Insufficient documentation

## 2020-03-28 DIAGNOSIS — K219 Gastro-esophageal reflux disease without esophagitis: Secondary | ICD-10-CM | POA: Diagnosis not present

## 2020-03-28 DIAGNOSIS — E119 Type 2 diabetes mellitus without complications: Secondary | ICD-10-CM | POA: Diagnosis not present

## 2020-03-28 DIAGNOSIS — K589 Irritable bowel syndrome without diarrhea: Secondary | ICD-10-CM | POA: Insufficient documentation

## 2020-03-28 DIAGNOSIS — Z90710 Acquired absence of both cervix and uterus: Secondary | ICD-10-CM | POA: Insufficient documentation

## 2020-03-28 DIAGNOSIS — Z794 Long term (current) use of insulin: Secondary | ICD-10-CM | POA: Diagnosis not present

## 2020-03-28 DIAGNOSIS — C73 Malignant neoplasm of thyroid gland: Secondary | ICD-10-CM | POA: Diagnosis not present

## 2020-03-28 DIAGNOSIS — I10 Essential (primary) hypertension: Secondary | ICD-10-CM | POA: Insufficient documentation

## 2020-03-28 DIAGNOSIS — Z7982 Long term (current) use of aspirin: Secondary | ICD-10-CM | POA: Diagnosis not present

## 2020-03-28 DIAGNOSIS — Z79899 Other long term (current) drug therapy: Secondary | ICD-10-CM | POA: Insufficient documentation

## 2020-03-28 DIAGNOSIS — E890 Postprocedural hypothyroidism: Secondary | ICD-10-CM | POA: Insufficient documentation

## 2020-03-28 LAB — CBC WITH DIFFERENTIAL/PLATELET
Abs Immature Granulocytes: 0.03 10*3/uL (ref 0.00–0.07)
Basophils Absolute: 0.1 10*3/uL (ref 0.0–0.1)
Basophils Relative: 1 %
Eosinophils Absolute: 0.5 10*3/uL (ref 0.0–0.5)
Eosinophils Relative: 6 %
HCT: 31.6 % — ABNORMAL LOW (ref 36.0–46.0)
Hemoglobin: 10 g/dL — ABNORMAL LOW (ref 12.0–15.0)
Immature Granulocytes: 0 %
Lymphocytes Relative: 29 %
Lymphs Abs: 2.4 10*3/uL (ref 0.7–4.0)
MCH: 27.7 pg (ref 26.0–34.0)
MCHC: 31.6 g/dL (ref 30.0–36.0)
MCV: 87.5 fL (ref 80.0–100.0)
Monocytes Absolute: 0.5 10*3/uL (ref 0.1–1.0)
Monocytes Relative: 6 %
Neutro Abs: 4.8 10*3/uL (ref 1.7–7.7)
Neutrophils Relative %: 58 %
Platelets: 282 10*3/uL (ref 150–400)
RBC: 3.61 MIL/uL — ABNORMAL LOW (ref 3.87–5.11)
RDW: 14.5 % (ref 11.5–15.5)
WBC: 8.3 10*3/uL (ref 4.0–10.5)
nRBC: 0 % (ref 0.0–0.2)

## 2020-03-28 LAB — IRON AND TIBC
Iron: 54 ug/dL (ref 28–170)
Saturation Ratios: 21 % (ref 10.4–31.8)
TIBC: 258 ug/dL (ref 250–450)
UIBC: 204 ug/dL

## 2020-03-28 LAB — BASIC METABOLIC PANEL
Anion gap: 12 (ref 5–15)
BUN: 26 mg/dL — ABNORMAL HIGH (ref 8–23)
CO2: 24 mmol/L (ref 22–32)
Calcium: 8.6 mg/dL — ABNORMAL LOW (ref 8.9–10.3)
Chloride: 105 mmol/L (ref 98–111)
Creatinine, Ser: 1.48 mg/dL — ABNORMAL HIGH (ref 0.44–1.00)
GFR calc Af Amer: 42 mL/min — ABNORMAL LOW (ref >60)
GFR calc non Af Amer: 37 mL/min — ABNORMAL LOW (ref >60)
Glucose, Bld: 175 mg/dL — ABNORMAL HIGH (ref 70–99)
Potassium: 4.4 mmol/L (ref 3.5–5.1)
Sodium: 141 mmol/L (ref 135–145)

## 2020-03-28 LAB — FERRITIN: Ferritin: 149 ng/mL (ref 11–307)

## 2020-03-28 MED ORDER — IRON SUCROSE 20 MG/ML IV SOLN
200.0000 mg | Freq: Once | INTRAVENOUS | Status: AC
  Administered 2020-03-28: 200 mg via INTRAVENOUS
  Filled 2020-03-28: qty 10

## 2020-03-28 MED ORDER — SODIUM CHLORIDE 0.9 % IV SOLN
Freq: Once | INTRAVENOUS | Status: AC
  Filled 2020-03-28: qty 250

## 2020-03-28 NOTE — Assessment & Plan Note (Addendum)
#  Mild worsening anemia-hemoglobin-hemoglobin 10.0./secondary to CKD-iron deficiency.  Proceed with IV iron today.  Hold off any erythropoietin stimulating agent at this time.  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  No role for any adjuvant therapy.  Patient referred by surgery/endocrinology for thyroid supplementation.   #Diabetes-hemoglobin A1c most recently 7.6; stable continue follow-up with endocrinology.  DISPOSITION:  # IV venofer today. # in 4 months- MD; cbc/bmp/iron studies/ferritin- possible venofer- Dr.B

## 2020-03-28 NOTE — Progress Notes (Signed)
[place] OFFICE PROGRESS NOTE  Patient Care Team: Revelo, Elyse Jarvis, MD as PCP - General (Family Medicine)  Oncology History Overview Note   SUMMARY OF ONCOLOGIC HISTORY: #October 2019-MGUS IgA kappa 0.  2 g/dL [Dr.Balch; elevated ESR]; June 2020 repeat M protein-negative  #Anemia hemoglobin 9.9/CKD stage III  #  Anterior mesenteric nodule- 16x70m [sep 2016; smaller; Feb 2016- S/p Bx- leiomyoma.]; [place] LN [[0XF sep 28182 improving].2016- Octreotide scan- NEG;  CT SEP 2017- NED [elevated chromogranin  Levels- slightly]; July 2019- CT STABLE; Oct 2019- MDTC- imaging reviewed; No further surveillaince  # CKD stage III/diabetes; hemoglobin 10 [EGD-2016; colo-2018-Dr.Elliot]  #April 2021-total thyroidectomy; [Dr.Cannon] Tumor Size: 0.8 cm in greatest dimension Histologic Type: Papillary carcinoma, classic (usual, conventional); stage I; no adjuvant therapy  # Retro-tracheal duplication cyst   [place]yroid [place] ([facility]  03/28/2020 Initial Diagnosis   Thyroid cancer ([facility]       INTERVAL HISTORY:   64year old female patient history of iron deficiency chronic kidney disease is here for follow-up.  The interim underwent total thyroidectomy for multinodular goiter.  Noted to have small focus of papillary carcinoma.  Patient is recovering fairly well.  Denies any nausea vomiting abdominal pain blood in stool black or stools.  Complains of fatigue.   Review of Systems  Constitutional: Positive for malaise/fatigue. Negative for chills, diaphoresis, fever and weight loss.  HENT: Negative for nosebleeds and sore throat.   Eyes: Negative for double vision.  Respiratory: Negative for cough, hemoptysis, sputum production, shortness of breath and wheezing.   Cardiovascular: Negative for chest pain, palpitations, orthopnea and leg swelling.  Gastrointestinal: Negative for abdominal pain, blood in stool, constipation, diarrhea, heartburn, melena, nausea and vomiting.   Genitourinary: Negative for dysuria, frequency and urgency.  Musculoskeletal: Positive for back pain and joint pain.  Skin: Negative.  Negative for itching and rash.  Neurological: Negative for dizziness, tingling, focal weakness, weakness and headaches.  Endo/Heme/Allergies: Does not bruise/bleed easily.  Psychiatric/Behavioral: Negative for depression. The patient is not nervous/anxious and does not have insomnia.      PAST MEDICAL HISTORY :  Past Medical History:  Diagnosis Date  . Anemia    iron anemia, now getting infusions  . Anemia of chronic kidney failure, stage 3 (moderate) 08/17/2019  . Arthritis   . BBB (bundle branch block)   . Bradycardia   . Coronary artery disease   . Diabetes mellitus without complication ([facility]   . DM type 2 with diabetic peripheral neuropathy ([facility] [place] 09/19/2015  . Dysphagia   . Dyspnea    recent complaints of sob  . Dysrhythmia   . GERD (gastroesophageal reflux disease)   . Headache   . History of hiatal hernia   . History of shingles   . Hypertension   . IBS (irritable bowel syndrome)   . Mesenteric mass   . Peripheral vascular disease ([facility]   . Rotator cuff tendinitis, left   . Unspecified atrial fibrillation ([facility]     PAST SURGICAL HISTORY :   Past Surgical History:  Procedure Laterality Date  . ABDOMINAL HYSTERECTOMY    . CHOLECYSTECTOMY    . COLONOSCOPY WITH PROPOFOL N/A 12/20/2016   Procedure: COLONOSCOPY WITH PROPOFOL;  Surgeon: RManya Silvas MD;  [facility];  Service: Endoscopy;  Laterality: N/A;  . CORONARY ANGIOPLASTY    . CORONARY ARTERY BYPASS GRAFT  2002  . ESOPHAGOGASTRODUODENOSCOPY (EGD) WITH PROPOFOL N/A 04/27/2017   Procedure: ESOPHAGOGASTRODUODENOSCOPY (EGD) WITH PROPOFOL;  Surgeon: EManya Silvas MD;  Location:  [place] ENDOSCOPY;  Service: Endoscopy;  Laterality: N/A;  . EUS N/A 04/24/2015   Procedure: ESOPHAGEAL ENDOSCOPIC ULTRASOUND (EUS) RADIAL;  Surgeon: Cora Daniels, MD;  Location[facility]  ENDOSCOPY;  Service: Endoscopy;  Laterality: N/A;  . THYROIDECTOMY, PARTIAL      FAMILY HISTORY :   Family History  Problem Relation Age of Onset  . Breast cancer Neg Hx     SOCIAL HISTORY:   Social History   Tobacco Use  . Smoking status: Never Smoker  . Smokeless tobacco: Never Used  Substance Use Topics  . Alcohol use: No    Alcohol/week: 0.0 standard drinks  . Drug use: No    ALLERGIES:  is allergic to other; iodinated diagnostic agents; oxycodone-acetaminophen; and etodolac.  MEDICATIONS:  Current Outpatient Medications  Medication Sig Dispense Refill  . Accu-Chek Softclix Lancets lancets     . acetaminophen (TYLENOL) 325 MG tablet Take 2 tablets (650 mg total) by mouth every 8 (eight) hours as needed for mild pain. 40 tablet 0  . amLODipine (NORVASC) 5 MG tablet Take 5 mg by mouth daily.     Marland Kitchen aspirin EC 81 MG tablet Take 81 mg by mouth daily.    . Blood Glucose Monitoring Suppl (ACCU-CHEK GUIDE) w/Device KIT     . calcium-vitamin D (OSCAL 500/200 D-3) 500-200 MG-UNIT tablet Take 2 tablets by mouth 3 (three) times daily. Take 2 tablets threes times a day for 1 week ,and then 1 tablet three times a day the next to weeks 90 tablet 1  . clotrimazole (LOTRIMIN) 1 % cream Apply 1 application topically 2 (two) times daily as needed (irritation).     . CONTOUR NEXT TEST test strip     . dicyclomine (BENTYL) 20 MG tablet Take 20 mg by mouth 2 (two) times daily as needed for spasms.     Marland Kitchen FIASP 100 UNIT/ML SOLN Inject 70 Units into the skin continuous. Via Insulin pump    . fluticasone (FLONASE) 50 MCG/ACT nasal spray Place 2 sprays into both nostrils daily as needed for allergies.     . furosemide (LASIX) 40 MG tablet Take 40 mg by mouth daily.     Marland Kitchen gabapentin (NEURONTIN) 300 MG capsule Take 300 mg by mouth daily.     . Insulin Disposable Pump (OMNIPOD DASH 5 PACK PODS) MISC CHANGE POD Q 48 H UTD    . Iron-Vitamin C (VITRON-C PO) Take 1 tablet by mouth daily.    Marland Kitchen  levothyroxine (SYNTHROID) 150 MCG tablet Take 1 tablet (150 mcg total) by mouth daily at 6 (six) AM. 30 tablet 3  . Magnesium 250 MG TABS Take 250 mg by mouth daily.    . metFORMIN (GLUCOPHAGE-XR) 500 MG 24 hr tablet Take 500-1,000 mg by mouth See admin instructions. Take 500 mg by mouth in the morning and 1000 mg at night    . Multiple Vitamin (MULTI-VITAMIN) tablet Take 1 tablet by mouth daily.    Marland Kitchen omeprazole (PRILOSEC) 20 MG capsule Take 20 mg by mouth daily.    . pravastatin (PRAVACHOL) 20 MG tablet Take 20 mg by mouth daily.     . promethazine (PHENERGAN) 12.5 MG tablet Take 12.5 mg by mouth every 6 (six) hours as needed for nausea or vomiting.     . sotalol (BETAPACE) 80 MG tablet Take 80 mg by mouth 2 (two) times daily.    Marland Kitchen spironolactone (ALDACTONE) 50 MG tablet Take 50 mg by mouth daily.     Marland Kitchen telmisartan (MICARDIS)  80 MG tablet Take 80 mg by mouth daily.     No current facility-administered medications for this visit.    PHYSICAL EXAMINATION: ECOG PERFORMANCE STATUS: 0 - Asymptomatic  BP (!) 149/69 (Patient Position: Sitting)   Pulse 74   Temp 97.6 F (36.4 C) (Tympanic)   Resp 20   Ht _0  (1.702 m)   Wt 238 lb (108 kg)   BMI 37.28 kg/m   Filed Weights   03/28/20 1339  Weight: 238 lb (108 kg)    Physical Exam  Constitutional: She is oriented to person, place, and time and well-developed, well-nourished, and in no distress.  Patient is alone.  HENT:  Head: Normocephalic and atraumatic.  Mouth/Throat: Oropharynx is clear and moist. No oropharyngeal exudate.  Eyes: Pupils are equal, round, and reactive to light.  Cardiovascular: Normal rate and regular rhythm.  Pulmonary/Chest: No respiratory distress. She has no wheezes.  Abdominal: Soft. Bowel sounds are normal. She exhibits no distension and no mass. There is no abdominal tenderness. There is no rebound and no guarding.  Musculoskeletal:        General: No tenderness or edema. Normal range of motion.      Cervical back: Normal range of motion and neck supple.  Neurological: She is alert and oriented to person, place, and time.  Skin: Skin is warm.  Psychiatric: Affect normal.     LABORATORY DATA:  I have reviewed the data as listed    Component Value Date/Time   NA 141 03/28/2020 1318   NA 136 03/10/2015 1640   K 4.4 03/28/2020 1318   K 2.7 (L) 03/10/2015 1640   CL 105 03/28/2020 1318   CL 96 (L) 03/10/2015 1640   CO2 24 03/28/2020 1318   CO2 31 03/10/2015 1640   GLUCOSE 175 (H) 03/28/2020 1318   GLUCOSE 99 03/10/2015 1640   BUN 26 (H) 03/28/2020 1318   BUN 16 03/10/2015 1640   CREATININE 1.48 (H) 03/28/2020 1318   CREATININE 1.18 (H) 03/10/2015 1640   CALCIUM 8.6 (L) 03/28/2020 1318   CALCIUM 9.0 03/10/2015 1640   PROT 7.2 01/29/2020 1755   PROT 7.7 03/10/2015 1640   ALBUMIN 3.8 03/15/2020 0552   ALBUMIN 4.1 03/10/2015 1640   AST 17 01/29/2020 1755   AST 34 03/10/2015 1640   ALT 15 01/29/2020 1755   ALT 39 03/10/2015 1640   ALKPHOS 75 01/29/2020 1755   ALKPHOS 60 03/10/2015 1640   BILITOT 0.4 01/29/2020 1755   BILITOT 0.4 03/10/2015 1640   GFRNONAA 37 (L) 03/28/2020 1318   GFRNONAA 50 (L) 03/10/2015 1640   GFRAA 42 (L) 03/28/2020 1318   GFRAA 58 (L) 03/10/2015 1640    No results found for: SPEP, UPEP  Lab Results  Component Value Date   WBC 8.3 03/28/2020   NEUTROABS 4.8 03/28/2020   HGB 10.0 (L) 03/28/2020   HCT 31.6 (L) 03/28/2020   MCV 87.5 03/28/2020   PLT 282 03/28/2020      Chemistry      Component Value Date/Time   NA 141 03/28/2020 1318   NA 136 03/10/2015 1640   K 4.4 03/28/2020 1318   K 2.7 (L) 03/10/2015 1640   CL 105 03/28/2020 1318   CL 96 (L) 03/10/2015 1640   CO2 24 03/28/2020 1318   CO2 31 03/10/2015 1640   BUN 26 (H) 03/28/2020 1318   BUN 16 03/10/2015 1640   CREATININE 1.48 (H) 03/28/2020 1318   CREATININE 1.18 (H) 03/10/2015 1640  Component Value Date/Time   CALCIUM 8.6 (L) 03/28/2020 1318   CALCIUM 9.0 03/10/2015 1640    ALKPHOS 75 01/29/2020 1755   ALKPHOS 60 03/10/2015 1640   AST 17 01/29/2020 1755   AST 34 03/10/2015 1640   ALT 15 01/29/2020 1755   ALT 39 03/10/2015 1640   BILITOT 0.4 01/29/2020 1755   BILITOT 0.4 03/10/2015 1640      ASSESSMENT & PLAN:   Anemia of chronic kidney failure, stage 3 (moderate) (HCC) #Mild worsening anemia-hemoglobin-hemoglobin 10.0./secondary to CKD-iron deficiency.  Proceed with IV iron today.  Hold off any erythropoietin stimulating agent at this time.  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  No role for any adjuvant therapy.  Patient referred by surgery/endocrinology for thyroid supplementation.   #Diabetes-hemoglobin A1c most recently 7.6; stable continue follow-up with endocrinology.  DISPOSITION:  # IV venofer today. # in 4 months- MD; cbc/bmp/iron studies/ferritin- possible venofer- Dr.B      Cammie Sickle, MD 03/28/2020 2:16 PM

## 2020-04-01 ENCOUNTER — Encounter: Payer: Self-pay | Admitting: General Surgery

## 2020-04-01 ENCOUNTER — Other Ambulatory Visit: Payer: Self-pay

## 2020-04-01 ENCOUNTER — Ambulatory Visit (INDEPENDENT_AMBULATORY_CARE_PROVIDER_SITE_OTHER): Payer: Self-pay | Admitting: General Surgery

## 2020-04-01 VITALS — BP 135/74 | HR 72 | Temp 97.5°F | Ht 67.0 in | Wt 237.0 lb

## 2020-04-01 DIAGNOSIS — E890 Postprocedural hypothyroidism: Secondary | ICD-10-CM

## 2020-04-01 NOTE — Progress Notes (Signed)
Hailey Howard is here today for a postoperative visit.  She is a 64 year old woman who underwent completion thyroidectomy for a multinodular goiter with compressive symptoms.  Her final pathology did reveal a 0.8 cm micro papillary thyroid carcinoma, completely resected.  She reports that she has been doing fairly well since her operation.  She denies any fevers or chills.  No nausea or vomiting.  She denies any heart palpitations or hand tremors.  She has chronic neuropathy but states that she has not experienced any new paresthesias, numbness, or cramping.  She has been taking 1 tablet of Os-Cal daily, rather than the 3 times a day that was recommended postoperatively.  She is currently taking 150 mcg of levothyroxine daily.  She does report taking it with all of her other medications in the morning, but she does wait 30 minutes before eating.  She still has some soreness on the left side of her neck as well as a foreign body sensation when she swallows.  She has some hoarseness first thing in the morning but states that this resolves during the day.  She does have a history of gastroesophageal reflux disease.  She also had an unfortunate incident with a phlebotomist drawing blood wherein it sounds like perhaps a nerve was stuck with a needle.  She is currently wearing a brace, as recommended by her primary care provider.  Today's Vitals   04/01/20 0953  BP: 135/74  Pulse: 72  Temp: (!) 97.5 F (36.4 C)  TempSrc: Temporal  SpO2: 99%  Weight: 237 lb (107.5 kg)  Height: 5\' 7"  (1.702 m)  PainSc: 0-No pain  PainLoc: Throat   Body mass index is 37.12 kg/m. Focused examination of the neck: The Steri-Strips were still in place.  These were removed to reveal a well approximated transverse thyroidectomy incision.  There is no erythema, induration, or drainage present.  There is a slight healing ridge beneath the surface.  Impression and plan: This is a 64 year old woman who underwent a completion  thyroidectomy.  A papillary microcarcinoma was appreciated within the surgical specimen.  I reassured her that no additional treatment or intervention was necessary.  She seems to be doing well on her current dose of thyroid hormone replacement.  She does have a visit scheduled with Dr. Gabriel Carina coming up in the next month.  I anticipate that her levels will be checked at that time.  I did recommend that she tried to take her thyroid hormone separately from her other medications, but she is concerned that she will forget to take it if she does so.  She may continue to take the calcium for bone health if desired, however she does not demonstrate any symptoms of hypocalcemia and may discontinue it if she prefers.  I also discussed scar care with her today.  I advised massage with a topical emollient agent such as vitamin E oil, cocoa butter, or Shea butter to minimize tethering and improve the cosmesis of her incision.  She should also use sunscreen for the next year to minimize altered pigmentation from ultraviolet exposure.  As she is in very good hands with Dr. Gabriel Carina, I have not scheduled her for any further follow-up in my clinic.  She is, of course, welcome to contact me with any questions or concerns going forward.Hailey Howard

## 2020-04-01 NOTE — Patient Instructions (Addendum)
Dr.Cannon recommends patient to try Cocoa or [place] or Vitamin E Oil and massage the area of the scar to help with healing of the scar.  Dr.Cannon suggest patient take Thyroid medication first thing in the morning and then wait at least 30 minutes and take the other medications along with a meal.  Follow-up with our office as needed. Please call and ask to speak with a nurse if you develop questions or concerns.

## 2020-04-08 ENCOUNTER — Telehealth: Payer: Self-pay | Admitting: General Surgery

## 2020-04-08 NOTE — Telephone Encounter (Signed)
Attempted to contact patient via mobile and home phone to let her know that Dr.Cannon does not think the dosage of her medication has any part of her feeling sick to her stomach or having the vomiting, as patient is to take the medication on an empty stomach. Dr.Cannon recommends patient to keep scheduled appointment with Dr.Solum on 04/30/20 to discuss the following issues.

## 2020-04-08 NOTE — Telephone Encounter (Signed)
Patient states that since taking the Synthroid she has been getting sick to her stomach and has thrown up some.  She feels like the medicine maybe too high of a dosage.  Please call her.  Thank you.

## 2020-04-08 NOTE — Telephone Encounter (Signed)
Patient called and was given this information.

## 2020-05-05 DIAGNOSIS — E890 Postprocedural hypothyroidism: Secondary | ICD-10-CM | POA: Insufficient documentation

## 2020-05-28 ENCOUNTER — Telehealth: Payer: Self-pay | Admitting: General Surgery

## 2020-05-28 NOTE — Telephone Encounter (Signed)
Pt called advising she had thyroid sx w/Dr. Celine Ahr in April & is still complaining of having a hoarse/scratchy throat.  She is unsure if this is to be expected or if there is anything that can be done to retain her voice/throat.  Please advise by calling back @ [phone].  Thank you

## 2020-05-29 NOTE — Telephone Encounter (Signed)
Patient to come in to see Dr Dahlia Byes for this next week.

## 2020-05-30 NOTE — Telephone Encounter (Signed)
Have her wait and see me, please.

## 2020-06-04 ENCOUNTER — Encounter: Payer: Medicare HMO | Admitting: Surgery

## 2020-06-12 ENCOUNTER — Ambulatory Visit (INDEPENDENT_AMBULATORY_CARE_PROVIDER_SITE_OTHER): Payer: Self-pay | Admitting: General Surgery

## 2020-06-12 ENCOUNTER — Other Ambulatory Visit: Payer: Self-pay

## 2020-06-12 ENCOUNTER — Encounter: Payer: Self-pay | Admitting: General Surgery

## 2020-06-12 VITALS — BP 123/64 | HR 55 | Temp 98.4°F | Resp 14 | Ht 67.0 in | Wt 241.6 lb

## 2020-06-12 DIAGNOSIS — E890 Postprocedural hypothyroidism: Secondary | ICD-10-CM

## 2020-06-12 NOTE — Progress Notes (Signed)
Hailey Howard is here today for an additional postoperative visit.  She is a 64 year old woman who had a completion thyroidectomy on March 14, 2020.  There was a papillary microcarcinoma discovered within the specimen, but due to its small size and lack of aggressive features, no additional therapy was warranted.  She has recently seen her endocrinologist and had her levothyroxine dose adjusted.  She has contacted our office with concerns regarding ongoing hoarseness, worse in the mornings, as well as a foreign body sensation and feeling as though she is producing a lot of phlegm.  She reports that she is unable to sing or project her voice as in yelling.  Today's Vitals   06/12/20 0909  BP: 123/64  Pulse: (!) 55  Resp: 14  Temp: 98.4 F (36.9 C)  SpO2: 98%  Weight: 241 lb 9.6 oz (109.6 kg)  Height: 5\' 7"  (1.702 m)   Body mass index is 37.84 kg/m. Focused examination of the neck demonstrates a transverse thyroidectomy scar at is healing nicely.  There is no concern for infection.  She has no palpable cervical or supraclavicular lymphadenopathy.  Her posterior oropharynx is somewhat erythematous and irritated in appearance.  She is able to vocalize sounds "eee" and "aaa" without difficulty, but is not able to project the sound.  Impression and plan: This is a 64 year old woman who had a large multinodular goiter and underwent completion thyroidectomy nearly 3 months ago.  As her hoarseness seems to be worse in the mornings and she does have a history of gastroesophageal reflux, I have recommended that she increase her current proton pump inhibitor from once a day to twice a day.  I discussed with her that the external branch of the superior laryngeal nerve is certainly at risk during her operation, but it is most commonly bruised or stretched, rather than severed.  This type of injury can take several months to resolve and I have encouraged her the patient and give it some additional time.  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  If she continues to experience symptoms after a couple more months, we will make a referral to otolaryngology and speech-language pathology for further evaluation and management.

## 2020-06-12 NOTE — Patient Instructions (Signed)
Try taking your Omeprazole twice a day for the next 2 months.   If after 2-3 months your voice does not improve please let us know and we can get you a referral to ENT and speech language pathologist.   You voice may take up to 6 months to get back to normal.

## 2020-07-25 ENCOUNTER — Inpatient Hospital Stay: Payer: Medicare HMO | Attending: Internal Medicine

## 2020-07-25 ENCOUNTER — Other Ambulatory Visit: Payer: Self-pay

## 2020-07-25 ENCOUNTER — Encounter: Payer: Self-pay | Admitting: Internal Medicine

## 2020-07-25 ENCOUNTER — Inpatient Hospital Stay: Payer: Medicare HMO | Admitting: Internal Medicine

## 2020-07-25 ENCOUNTER — Inpatient Hospital Stay: Payer: Medicare HMO

## 2020-07-25 DIAGNOSIS — C73 Malignant neoplasm of thyroid gland: Secondary | ICD-10-CM | POA: Diagnosis not present

## 2020-07-25 DIAGNOSIS — N183 Chronic kidney disease, stage 3 unspecified: Secondary | ICD-10-CM | POA: Insufficient documentation

## 2020-07-25 DIAGNOSIS — E119 Type 2 diabetes mellitus without complications: Secondary | ICD-10-CM | POA: Insufficient documentation

## 2020-07-25 DIAGNOSIS — E890 Postprocedural hypothyroidism: Secondary | ICD-10-CM | POA: Diagnosis not present

## 2020-07-25 DIAGNOSIS — I10 Essential (primary) hypertension: Secondary | ICD-10-CM | POA: Insufficient documentation

## 2020-07-25 DIAGNOSIS — Z794 Long term (current) use of insulin: Secondary | ICD-10-CM | POA: Insufficient documentation

## 2020-07-25 DIAGNOSIS — D631 Anemia in chronic kidney disease: Secondary | ICD-10-CM

## 2020-07-25 DIAGNOSIS — Z79899 Other long term (current) drug therapy: Secondary | ICD-10-CM | POA: Diagnosis not present

## 2020-07-25 DIAGNOSIS — Z90710 Acquired absence of both cervix and uterus: Secondary | ICD-10-CM | POA: Diagnosis not present

## 2020-07-25 DIAGNOSIS — I2510 Atherosclerotic heart disease of native coronary artery without angina pectoris: Secondary | ICD-10-CM | POA: Diagnosis not present

## 2020-07-25 DIAGNOSIS — K589 Irritable bowel syndrome without diarrhea: Secondary | ICD-10-CM | POA: Diagnosis not present

## 2020-07-25 DIAGNOSIS — Z7982 Long term (current) use of aspirin: Secondary | ICD-10-CM | POA: Insufficient documentation

## 2020-07-25 DIAGNOSIS — K219 Gastro-esophageal reflux disease without esophagitis: Secondary | ICD-10-CM | POA: Insufficient documentation

## 2020-07-25 LAB — CBC WITH DIFFERENTIAL/PLATELET
Abs Immature Granulocytes: 0.06 10*3/uL (ref 0.00–0.07)
Basophils Absolute: 0.1 10*3/uL (ref 0.0–0.1)
Basophils Relative: 1 %
Eosinophils Absolute: 0.2 10*3/uL (ref 0.0–0.5)
Eosinophils Relative: 2 %
HCT: 34.1 % — ABNORMAL LOW (ref 36.0–46.0)
Hemoglobin: 11.1 g/dL — ABNORMAL LOW (ref 12.0–15.0)
Immature Granulocytes: 1 %
Lymphocytes Relative: 20 %
Lymphs Abs: 2.1 10*3/uL (ref 0.7–4.0)
MCH: 28.1 pg (ref 26.0–34.0)
MCHC: 32.6 g/dL (ref 30.0–36.0)
MCV: 86.3 fL (ref 80.0–100.0)
Monocytes Absolute: 0.6 10*3/uL (ref 0.1–1.0)
Monocytes Relative: 5 %
Neutro Abs: 7.6 10*3/uL (ref 1.7–7.7)
Neutrophils Relative %: 71 %
Platelets: 270 10*3/uL (ref 150–400)
RBC: 3.95 MIL/uL (ref 3.87–5.11)
RDW: 14 % (ref 11.5–15.5)
WBC: 10.6 10*3/uL — ABNORMAL HIGH (ref 4.0–10.5)
nRBC: 0 % (ref 0.0–0.2)

## 2020-07-25 LAB — IRON AND TIBC
Iron: 36 ug/dL (ref 28–170)
Saturation Ratios: 13 % (ref 10.4–31.8)
TIBC: 269 ug/dL (ref 250–450)
UIBC: 233 ug/dL

## 2020-07-25 LAB — FERRITIN: Ferritin: 117 ng/mL (ref 11–307)

## 2020-07-25 LAB — BASIC METABOLIC PANEL
Anion gap: 12 (ref 5–15)
BUN: 34 mg/dL — ABNORMAL HIGH (ref 8–23)
CO2: 23 mmol/L (ref 22–32)
Calcium: 8.5 mg/dL — ABNORMAL LOW (ref 8.9–10.3)
Chloride: 103 mmol/L (ref 98–111)
Creatinine, Ser: 1.6 mg/dL — ABNORMAL HIGH (ref 0.44–1.00)
GFR calc Af Amer: 39 mL/min — ABNORMAL LOW (ref >60)
GFR calc non Af Amer: 33 mL/min — ABNORMAL LOW (ref >60)
Glucose, Bld: 201 mg/dL — ABNORMAL HIGH (ref 70–99)
Potassium: 4.1 mmol/L (ref 3.5–5.1)
Sodium: 138 mmol/L (ref 135–145)

## 2020-07-25 NOTE — Progress Notes (Signed)
[facility] OFFICE PROGRESS NOTE  Patient Care Team: Revelo, Elyse Jarvis, MD as PCP - General (Family Medicine) Cammie Sickle, MD as Consulting Physician (Hematology and Oncology)  Oncology History Overview Note   SUMMARY OF ONCOLOGIC HISTORY: #October 2019-MGUS IgA kappa 0.  2 g/dL [Dr.Balch; elevated ESR]; June 2020 repeat M protein-negative  #Anemia hemoglobin 9.9/CKD stage III  #  Anterior mesenteric nodule- 16x59m [sep 2016; smaller; Feb 2016- S/p Bx- leiomyoma.]; [place] LN [[1WC sep 25852 improving].2016- Octreotide scan- NEG;  CT SEP 2017- NED [elevated chromogranin  Levels- slightly]; July 2019- CT STABLE; Oct 2019- MDTC- imaging reviewed; No further surveillaince  # CKD stage III/diabetes; hemoglobin 10 [EGD-2016; colo-2018-Dr.Elliot]  #April 2021-total thyroidectomy; [Dr.Cannon] Tumor Size: 0.8 cm in greatest dimension Histologic Type: Papillary carcinoma, classic (usual, conventional); stage I; no adjuvant therapy  # Retro-tracheal duplication cyst   [place]  03/28/2020 Initial Diagnosis   [place] cancer ([facility]       INTERVAL HISTORY:   64year old female patient history of iron deficiency chronic kidney disease is here for follow-up.  Patient denies any blood in stool or black or stool.  Denies any nausea vomiting.  Continues to have fatigue.  Not any worse.   Review of Systems  Constitutional: Positive for malaise/fatigue. Negative for chills, diaphoresis, fever and weight loss.  HENT: Negative for nosebleeds and sore throat.   Eyes: Negative for double vision.  Respiratory: Negative for cough, hemoptysis, sputum production, shortness of breath and wheezing.   Cardiovascular: Negative for chest pain, palpitations, orthopnea and leg swelling.  Gastrointestinal: Negative for abdominal pain, blood in stool, constipation, diarrhea, heartburn, melena, nausea and vomiting.  Genitourinary: Negative for dysuria, frequency and  urgency.  Musculoskeletal: Positive for back pain and joint pain.  Skin: Negative.  Negative for itching and rash.  Neurological: Negative for dizziness, tingling, focal weakness, weakness and headaches.  Endo/Heme/Allergies: Does not bruise/bleed easily.  Psychiatric/Behavioral: Negative for depression. The patient is not nervous/anxious and does not have insomnia.      PAST MEDICAL HISTORY :  Past Medical History:  Diagnosis Date  . Anemia    iron anemia, now getting infusions  . Anemia of chronic kidney failure, stage 3 (moderate) 08/17/2019  . Arthritis   . BBB (bundle branch block)   . Bradycardia   . Coronary artery disease   . Diabetes mellitus without complication ([facility]   . DM type 2 with diabetic peripheral neuropathy ([facility] [place] 09/19/2015  . Dysphagia   . Dyspnea    recent complaints of sob  . Dysrhythmia   . GERD (gastroesophageal reflux disease)   . Headache   . History of hiatal hernia   . History of shingles   . Hypertension   . IBS (irritable bowel syndrome)   . Mesenteric mass   . Peripheral vascular disease ([facility]   . Rotator cuff tendinitis, left   . Unspecified atrial fibrillation ([facility]     PAST SURGICAL HISTORY :   Past Surgical History:  Procedure Laterality Date  . ABDOMINAL HYSTERECTOMY    . CHOLECYSTECTOMY    . COLONOSCOPY WITH PROPOFOL N/A 12/20/2016   Procedure: COLONOSCOPY WITH PROPOFOL;  Surgeon: RManya Silvas MD;  [facility];  Service: Endoscopy;  Laterality: N/A;  . CORONARY ANGIOPLASTY    . CORONARY ARTERY BYPASS GRAFT  2002  . ESOPHAGOGASTRODUODENOSCOPY (EGD) WITH PROPOFOL N/A 04/27/2017   Procedure: ESOPHAGOGASTRODUODENOSCOPY (EGD) WITH PROPOFOL;  Surgeon: EManya Silvas MD;  [facility];  Service: Endoscopy;  Laterality: N/A;  . EUS N/A 04/24/2015   Procedure: ESOPHAGEAL ENDOSCOPIC ULTRASOUND (EUS) RADIAL;  Surgeon: Cora Daniels, MD;  Location[facility] END[facility]COPY;  Service: Endoscopy;  Laterality: N/A;  .  THYROIDECTOMY, PARTIAL      FAMILY HISTORY :   Family History  Problem Relation Age of Onset  . Breast cancer Neg Hx     SOCIAL HISTORY:   Social History   Tobacco Use  . Smoking status: Never Smoker  . Smokeless tobacco: Never Used  Vaping Use  . Vaping Use: Never used  Substance Use Topics  . Alcohol use: No    Alcohol/week: 0.0 standard drinks  . Drug use: No    ALLERGIES:  is allergic to other, iodinated diagnostic agents, oxycodone-acetaminophen, and etodolac.  MEDICATIONS:  Current Outpatient Medications  Medication Sig Dispense Refill  . Accu-Chek Softclix Lancets lancets     . amLODipine (NORVASC) 5 MG tablet Take 5 mg by mouth daily.     Marland Kitchen aspirin EC 81 MG tablet Take 81 mg by mouth daily.    . Blood Glucose Monitoring Suppl (ACCU-CHEK GUIDE) w/Device KIT     . Cholecalciferol 25 MCG (1000 UT) tablet Take by mouth.    . clotrimazole (LOTRIMIN) 1 % cream Apply 1 application topically 2 (two) times daily as needed (irritation).     . CONTOUR NEXT TEST test strip     . dicyclomine (BENTYL) 20 MG tablet Take 20 mg by mouth 2 (two) times daily as needed for spasms.     Marland Kitchen FIASP 100 UNIT/ML SOLN Inject 70 Units into the skin continuous. Via Insulin pump    . fluticasone (FLONASE) 50 MCG/ACT nasal spray Place 2 sprays into both nostrils daily as needed for allergies.     . furosemide (LASIX) 40 MG tablet Take 40 mg by mouth daily.     Marland Kitchen gabapentin (NEURONTIN) 300 MG capsule Take 300 mg by mouth daily.     . Insulin Disposable Pump (OMNIPOD DASH 5 PACK PODS) MISC CHANGE POD Q 48 H UTD    . levothyroxine (SYNTHROID) 137 MCG tablet     . Magnesium 250 MG TABS Take 250 mg by mouth daily.    . metFORMIN (GLUCOPHAGE-XR) 500 MG 24 hr tablet Take 500-1,000 mg by mouth See admin instructions. Take 500 mg by mouth in the morning and 1000 mg at night    . Multiple Vitamin (MULTI-VITAMIN) tablet Take 1 tablet by mouth daily.    Marland Kitchen omeprazole (PRILOSEC) 40 MG capsule     .  pravastatin (PRAVACHOL) 20 MG tablet Take 20 mg by mouth daily.     . promethazine (PHENERGAN) 12.5 MG tablet Take 12.5 mg by mouth every 6 (six) hours as needed for nausea or vomiting.     . sotalol (BETAPACE) 80 MG tablet Take 80 mg by mouth 2 (two) times daily.    Marland Kitchen spironolactone (ALDACTONE) 50 MG tablet Take 50 mg by mouth daily.     Marland Kitchen telmisartan (MICARDIS) 80 MG tablet Take 80 mg by mouth daily.     No current facility-administered medications for this visit.    PHYSICAL EXAMINATION: ECOG PERFORMANCE STATUS: 0 - Asymptomatic  BP 135/77 (BP Location: Left Arm, Patient Position: Sitting, Cuff Size: Large)   Pulse 62   Temp 97.6 F (36.4 C) (Tympanic)   Resp 16   Ht 5' 7"  (1.702 m)   Wt 244 lb (110.7 kg)   SpO2 100%   BMI 38.22 kg/m   [employer]  07/25/20 1310  Weight: 244 lb (110.7 kg)    Physical Exam Constitutional:      Comments: Patient is alone.  HENT:     Head: Normocephalic and atraumatic.     Mouth/Throat:     Pharynx: No oropharyngeal exudate.  Eyes:     Pupils: Pupils are equal, round, and reactive to light.  Cardiovascular:     Rate and Rhythm: Normal rate and regular rhythm.  Pulmonary:     Effort: No respiratory distress.     Breath sounds: No wheezing.  Abdominal:     General: Bowel sounds are normal. There is no distension.     Palpations: Abdomen is soft. There is no mass.     Tenderness: There is no abdominal tenderness. There is no guarding or rebound.  Musculoskeletal:        General: No tenderness. Normal range of motion.     Cervical back: Normal range of motion and neck supple.  Skin:    General: Skin is warm.  Neurological:     Mental Status: She is alert and oriented to person, place, and time.  Psychiatric:        Mood and Affect: Affect normal.      LABORATORY DATA:  I have reviewed the data as listed    Component Value Date/Time   NA 138 07/25/2020 1252   NA 136 03/10/2015 1640   K 4.1 07/25/2020 1252   K 2.7 (L)  03/10/2015 1640   CL 103 07/25/2020 1252   CL 96 (L) 03/10/2015 1640   CO2 23 07/25/2020 1252   CO2 31 03/10/2015 1640   GLUCOSE 201 (H) 07/25/2020 1252   GLUCOSE 99 03/10/2015 1640   BUN 34 (H) 07/25/2020 1252   BUN 16 03/10/2015 1640   CREATININE 1.60 (H) 07/25/2020 1252   CREATININE 1.18 (H) 03/10/2015 1640   CALCIUM 8.5 (L) 07/25/2020 1252   CALCIUM 9.0 03/10/2015 1640   PROT 7.2 01/29/2020 1755   PROT 7.7 03/10/2015 1640   ALBUMIN 3.8 03/15/2020 0552   ALBUMIN 4.1 03/10/2015 1640   AST 17 01/29/2020 1755   AST 34 03/10/2015 1640   ALT 15 01/29/2020 1755   ALT 39 03/10/2015 1640   ALKPHOS 75 01/29/2020 1755   ALKPHOS 60 03/10/2015 1640   BILITOT 0.4 01/29/2020 1755   BILITOT 0.4 03/10/2015 1640   GFRNONAA 33 (L) 07/25/2020 1252   GFRNONAA 50 (L) 03/10/2015 1640   GFRAA 39 (L) 07/25/2020 1252   GFRAA 58 (L) 03/10/2015 1640    No results found for: SPEP, UPEP  Lab Results  Component Value Date   WBC 10.6 (H) 07/25/2020   NEUTROABS 7.6 07/25/2020   HGB 11.1 (L) 07/25/2020   HCT 34.1 (L) 07/25/2020   MCV 86.3 07/25/2020   PLT 270 07/25/2020      Chemistry      Component Value Date/Time   NA 138 07/25/2020 1252   NA 136 03/10/2015 1640   K 4.1 07/25/2020 1252   K 2.7 (L) 03/10/2015 1640   CL 103 07/25/2020 1252   CL 96 (L) 03/10/2015 1640   CO2 23 07/25/2020 1252   CO2 31 03/10/2015 1640   BUN 34 (H) 07/25/2020 1252   BUN 16 03/10/2015 1640   CREATININE 1.60 (H) 07/25/2020 1252   CREATININE 1.18 (H) 03/10/2015 1640      Component Value Date/Time   CALCIUM 8.5 (L) 07/25/2020 1252   CALCIUM 9.0 03/10/2015 1640   ALKPHOS 75 01/29/2020 1755  ALKPHOS 60 03/10/2015 1640   AST 17 01/29/2020 1755   AST 34 03/10/2015 1640   ALT 15 01/29/2020 1755   ALT 39 03/10/2015 1640   BILITOT 0.4 01/29/2020 1755   BILITOT 0.4 03/10/2015 1640      ASSESSMENT & PLAN:   Anemia of chronic kidney failure, stage 3 (moderate) (HCC) # Anemia-hemoglobin-hemoglobin  11.1./secondary to CKD-iron deficiency.  HOLD  IV iron today.  Hold off any erythropoietin stimulating agent at this time.  #CKD stage III [GFR 43] no NSAIDs.[ Lasix q OD; [facility]];STABLE  #S/p thyroidectomy thyroid Tumor Size: 0.8 cm in greatest dimension  Histologic Type: Papillary carcinoma, classic (usual, conventional).  No role for any adjuvant therapy; followed by endocrinology.  # IV access: Poor/hydration  DISPOSITION:  # HOLD IV venofer today. # in 4 months- MD; cbc/bmp- possible venofer- Dr.B      Cammie Sickle, MD 07/25/2020 1:40 PM

## 2020-07-25 NOTE — Assessment & Plan Note (Addendum)
#   Anemia-hemoglobin-hemoglobin 11.1./secondary to CKD-iron deficiency.  HOLD  IV iron today.  Hold off any erythropoietin stimulating agent at this time.  #CKD stage III [GFR 43] no NSAIDs.[ Lasix q OD; [facility]];STABLE  #S/p thyroidectomy thyroid Tumor Size: 0.8 cm in greatest dimension  Histologic Type: Papillary carcinoma, classic (usual, conventional).  No role for any adjuvant therapy; followed by endocrinology.  # IV access: Poor/hydration  DISPOSITION:  # HOLD IV venofer today. # in 4 months- MD; cbc/bmp- possible venofer- Dr.B

## 2020-09-18 ENCOUNTER — Encounter: Payer: Self-pay | Admitting: General Surgery

## 2020-09-18 ENCOUNTER — Ambulatory Visit (INDEPENDENT_AMBULATORY_CARE_PROVIDER_SITE_OTHER): Payer: Medicare HMO | Admitting: General Surgery

## 2020-09-18 ENCOUNTER — Other Ambulatory Visit: Payer: Self-pay

## 2020-09-18 VITALS — BP 136/69 | HR 58 | Temp 98.4°F | Ht 67.0 in | Wt 247.8 lb

## 2020-09-18 DIAGNOSIS — R1310 Dysphagia, unspecified: Secondary | ICD-10-CM | POA: Diagnosis not present

## 2020-09-18 DIAGNOSIS — R499 Unspecified voice and resonance disorder: Secondary | ICD-10-CM | POA: Diagnosis not present

## 2020-09-18 NOTE — Progress Notes (Signed)
Patient ID: Hailey Howard, female   DOB: May 07, 1956, 64 y.o.   MRN: [id number]  Chief Complaint  Patient presents with  . Follow-up    Follow up - difficulty swallowing and feels like something is stuck in her throat - Dr Celine Ahr    HPI Hailey Howard is a 64 y.o. female.  She underwent completion thyroidectomy with me in April of this year.  Final pathology demonstrated a papillary microcarcinoma that did not require any further intervention.  Intraoperative nerve monitoring was used and both recurrent laryngeal nerves were both structurally intact as well as gave good functional signals during the procedure.  Several weeks after surgery, she came to the clinic complaining of a globus sensation as well as an inability to project her voice.  I doubled her PPI dose and discussed with her the possibility of a bruise or stretch-type injury to the external branch of the superior laryngeal nerve.  She was advised to contact her office if she continued to experience symptoms after a couple of months.  She contacted our office indicating that she was still having issues.  She reports that the increase in her PPI had no effect.  She denies any dysphagia, specifically she does not choke on thin liquids.  She is able to vocalize the sounds "eee" and "aayy" without difficulty, but is unable to yell or shout.   Past Medical History:  Diagnosis Date  . Anemia    iron anemia, now getting infusions  . Anemia of chronic kidney failure, stage 3 (moderate) ([place]) 08/17/2019  . Arthritis   . BBB (bundle branch block)   . Bradycardia   . Coronary artery disease   . Diabetes mellitus without complication ([place])   . DM type 2 with diabetic peripheral neuropathy ([place]) 09/19/2015  . Dysphagia   . Dyspnea    recent complaints of sob  . Dysrhythmia   . GERD (gastroesophageal reflux disease)   . Headache   . History of hiatal hernia   . History of shingles   . Hypertension   . IBS (irritable bowel syndrome)    . Mesenteric mass   . Peripheral vascular disease ([place])   . Rotator cuff tendinitis, left   . Unspecified atrial fibrillation [facility])     Past Surgical History:  Procedure Laterality Date  . ABDOMINAL HYSTERECTOMY    . CHOLECYSTECTOMY    . COLONOSCOPY WITH PROPOFOL N/A 12/20/2016   Procedure: COLONOSCOPY WITH PROPOFOL;  Surgeon: Manya Silvas, MD;  [facility];  Service: Endoscopy;  Laterality: N/A;  . CORONARY ANGIOPLASTY    . CORONARY ARTERY BYPASS GRAFT  2002  . ESOPHAGOGASTRODUODENOSCOPY (EGD) WITH PROPOFOL N/A 04/27/2017   Procedure: ESOPHAGOGASTRODUODENOSCOPY (EGD) WITH PROPOFOL;  Surgeon: Manya Silvas, MD;  Location[facility] ENDOSCOPY;  Service: Endoscopy;  Laterality: N/A;  . EUS N/A 04/24/2015   Procedure: ESOPHAGEAL ENDOSCOPIC ULTRASOUND (EUS) RADIAL;  Surgeon: Cora Daniels, MD;  [facility];  Service: Endoscopy;  Laterality: N/A;  . THYROIDECTOMY, PARTIAL      Family History  Problem Relation Age of Onset  . Breast cancer Neg Hx     Social History Social History   Tobacco Use  . Smoking status: Never Smoker  . Smokeless tobacco: Never Used  Vaping Use  . Vaping Use: Never used  Substance Use Topics  . Alcohol use: No    Alcohol/week: 0.0 standard drinks  . Drug use: No    Allergies  Allergen Reactions  . Other Anaphylaxis, Hives  and Itching    Iv dye  Confirmed with pt over the phone on 08/01/15   . Iodinated Diagnostic Agents Hives  . Oxycodone-Acetaminophen Hives  . Etodolac Itching    Current Outpatient Medications  Medication Sig Dispense Refill  . amLODipine (NORVASC) 5 MG tablet Take 5 mg by mouth daily.     Marland Kitchen aspirin EC 81 MG tablet Take 81 mg by mouth daily.    . Cholecalciferol 25 MCG (1000 UT) tablet Take by mouth.    . clotrimazole (LOTRIMIN) 1 % cream Apply 1 application topically 2 (two) times daily as needed (irritation).     . CONTOUR NEXT TEST test strip     . dicyclomine (BENTYL) 20 MG tablet Take 20  mg by mouth 2 (two) times daily as needed for spasms.     . fluticasone (FLONASE) 50 MCG/ACT nasal spray Place 2 sprays into both nostrils daily as needed for allergies.     . furosemide (LASIX) 40 MG tablet Take 40 mg by mouth daily.     Marland Kitchen gabapentin (NEURONTIN) 300 MG capsule Take 300 mg by mouth daily.     . Insulin Disposable Pump (OMNIPOD DASH 5 PACK PODS) MISC CHANGE POD Q 48 H UTD    . Iron-Vitamin C (VITRON-C) 65-125 MG TABS Take by mouth.    . Lancets MISC     . levothyroxine (SYNTHROID) 137 MCG tablet     . Magnesium 250 MG TABS Take 250 mg by mouth daily.    . metFORMIN (GLUCOPHAGE-XR) 500 MG 24 hr tablet Take 500-1,000 mg by mouth See admin instructions. Take 500 mg by mouth in the morning and 1000 mg at night    . Multiple Vitamin (MULTI-VITAMIN) tablet Take 1 tablet by mouth daily.    Marland Kitchen omeprazole (PRILOSEC) 40 MG capsule     . pravastatin (PRAVACHOL) 20 MG tablet Take 20 mg by mouth daily.     . promethazine (PHENERGAN) 12.5 MG tablet Take 12.5 mg by mouth every 6 (six) hours as needed for nausea or vomiting.     . sotalol (BETAPACE) 80 MG tablet Take 80 mg by mouth 2 (two) times daily.    Marland Kitchen spironolactone (ALDACTONE) 50 MG tablet Take 50 mg by mouth daily.     Marland Kitchen telmisartan (MICARDIS) 80 MG tablet Take 80 mg by mouth daily.    . Accu-Chek Softclix Lancets lancets     . Blood Glucose Monitoring Suppl (ACCU-CHEK GUIDE) w/Device KIT     . Calcium Citrate-Vitamin D 200-250 MG-UNIT TABS Take by mouth. (Patient not taking: Reported on 09/18/2020)    . FIASP 100 UNIT/ML SOLN Inject 70 Units into the skin continuous. Via Insulin pump (Patient not taking: Reported on 09/18/2020)     No current facility-administered medications for this visit.    Review of Systems Review of Systems  All other systems reviewed and are negative.   Blood pressure 136/69, pulse (!) 58, temperature 98.4 F (36.9 C), temperature source Oral, height _0  (1.702 m), weight 247 lb 12.8 oz (112.4 kg),  SpO2 98 %.  Physical Exam Physical Exam Vitals reviewed.  Constitutional:      General: She is not in acute distress.    Appearance: Normal appearance. She is obese.  HENT:     Head: Normocephalic and atraumatic.     Nose:     Comments: Covered with a mask    Mouth/Throat:     Comments: Covered with a mask Eyes:     General:  No scleral icterus.       Right eye: No discharge.        Left eye: No discharge.  Neck:     Comments: Her thyroidectomy scars from both my operation and her prior partial thyroidectomy are well-healed and beginning to fade.  She has no palpable cervical or supraclavicular lymphadenopathy.  Vocal quality with speaking at a conversational volume is normal. Cardiovascular:     Rate and Rhythm: Regular rhythm. Bradycardia present.  Pulmonary:     Effort: Pulmonary effort is normal.     Breath sounds: Normal breath sounds.  Abdominal:     General: Bowel sounds are normal.     Palpations: Abdomen is soft.  Genitourinary:    Comments: Deferred Musculoskeletal:        General: No deformity or signs of injury.  Skin:    General: Skin is warm and dry.  Neurological:     General: No focal deficit present.     Mental Status: She is alert and oriented to person, place, and time.  Psychiatric:        Mood and Affect: Mood normal.        Behavior: Behavior normal.     Data Reviewed I reviewed my operative note from April of this year, confirming that both recurrent laryngeal nerves were well visualized and gave good functional signals throughout the case.  Assessment This is a 64 year old woman who underwent reoperative thyroid surgery in April.  She has persistent complaints of a foreign body sensation, as well as vocal changes.  She did not have any response to an increased dose in her proton pump inhibitor.  Plan Due to her persistent voice and swallowing concerns, I have placed a referral to Dr. Johnna Acosta at [facility], an  expert in voice and swallowing disorders.  I believe this referral is necessary due to the specific expertise that Dr. Rowe Clack has and that is not available with in the [place] system.  I communicated personally with Dr. Rowe Clack and she will work on making arrangements to have the patient seen.  Ms. Geno will continue to follow-up with Dr. Gabriel Carina for management of her thyroid hormone replacement.  I will see her on an as-needed basis.    Fredirick Maudlin 09/18/2020, 3:42 PM

## 2020-09-18 NOTE — Patient Instructions (Signed)
Dr.Cannon discussed with patient that referral was sent to [facility]  ENT facility will contact patient to get scheduled, once they have received patient's referral.

## 2020-10-21 DIAGNOSIS — R490 Dysphonia: Secondary | ICD-10-CM | POA: Insufficient documentation

## 2020-10-21 DIAGNOSIS — R1310 Dysphagia, unspecified: Secondary | ICD-10-CM | POA: Insufficient documentation

## 2020-10-21 DIAGNOSIS — J385 Laryngeal spasm: Secondary | ICD-10-CM | POA: Insufficient documentation

## 2020-11-10 ENCOUNTER — Other Ambulatory Visit: Payer: Self-pay | Admitting: Gastroenterology

## 2020-11-10 DIAGNOSIS — R1313 Dysphagia, pharyngeal phase: Secondary | ICD-10-CM

## 2020-11-13 ENCOUNTER — Ambulatory Visit: Payer: Medicare HMO

## 2020-11-17 ENCOUNTER — Other Ambulatory Visit: Payer: Self-pay

## 2020-11-17 ENCOUNTER — Ambulatory Visit
Admission: RE | Admit: 2020-11-17 | Discharge: 2020-11-17 | Disposition: A | Discharge status: Home / Self Care | Payer: Medicare HMO | Source: Ambulatory Visit | Attending: Gastroenterology | Admitting: Gastroenterology

## 2020-11-17 DIAGNOSIS — R1313 Dysphagia, pharyngeal phase: Secondary | ICD-10-CM | POA: Diagnosis not present

## 2020-11-27 ENCOUNTER — Other Ambulatory Visit: Payer: Self-pay | Admitting: Family Medicine

## 2020-11-27 DIAGNOSIS — Z1231 Encounter for screening mammogram for malignant neoplasm of breast: Secondary | ICD-10-CM

## 2020-11-28 ENCOUNTER — Inpatient Hospital Stay (HOSPITAL_BASED_OUTPATIENT_CLINIC_OR_DEPARTMENT_OTHER): Payer: Medicare HMO | Admitting: Nurse Practitioner

## 2020-11-28 ENCOUNTER — Inpatient Hospital Stay: Payer: Medicare HMO

## 2020-11-28 ENCOUNTER — Encounter: Payer: Self-pay | Admitting: Nurse Practitioner

## 2020-11-28 ENCOUNTER — Inpatient Hospital Stay: Payer: Medicare HMO | Attending: Internal Medicine

## 2020-11-28 VITALS — BP 148/80 | HR 55 | Temp 96.6°F | Wt 244.6 lb

## 2020-11-28 DIAGNOSIS — D631 Anemia in chronic kidney disease: Secondary | ICD-10-CM | POA: Insufficient documentation

## 2020-11-28 DIAGNOSIS — N183 Chronic kidney disease, stage 3 unspecified: Secondary | ICD-10-CM

## 2020-11-28 DIAGNOSIS — D472 Monoclonal gammopathy: Secondary | ICD-10-CM | POA: Insufficient documentation

## 2020-11-28 LAB — CBC WITH DIFFERENTIAL/PLATELET
Abs Immature Granulocytes: 0.01 10*3/uL (ref 0.00–0.07)
Basophils Absolute: 0 10*3/uL (ref 0.0–0.1)
Basophils Relative: 1 %
Eosinophils Absolute: 0.3 10*3/uL (ref 0.0–0.5)
Eosinophils Relative: 5 %
HCT: 37.7 % (ref 36.0–46.0)
Hemoglobin: 12 g/dL (ref 12.0–15.0)
Immature Granulocytes: 0 %
Lymphocytes Relative: 32 %
Lymphs Abs: 2.2 10*3/uL (ref 0.7–4.0)
MCH: 27.6 pg (ref 26.0–34.0)
MCHC: 31.8 g/dL (ref 30.0–36.0)
MCV: 86.9 fL (ref 80.0–100.0)
Monocytes Absolute: 0.5 10*3/uL (ref 0.1–1.0)
Monocytes Relative: 7 %
Neutro Abs: 3.7 10*3/uL (ref 1.7–7.7)
Neutrophils Relative %: 55 %
Platelets: 256 10*3/uL (ref 150–400)
RBC: 4.34 MIL/uL (ref 3.87–5.11)
RDW: 14.1 % (ref 11.5–15.5)
WBC: 6.8 10*3/uL (ref 4.0–10.5)
nRBC: 0 % (ref 0.0–0.2)

## 2020-11-28 LAB — BASIC METABOLIC PANEL
Anion gap: 11 (ref 5–15)
BUN: 38 mg/dL — ABNORMAL HIGH (ref 8–23)
CO2: 26 mmol/L (ref 22–32)
Calcium: 8.9 mg/dL (ref 8.9–10.3)
Chloride: 104 mmol/L (ref 98–111)
Creatinine, Ser: 1.71 mg/dL — ABNORMAL HIGH (ref 0.44–1.00)
GFR, Estimated: 33 mL/min — ABNORMAL LOW (ref >60)
Glucose, Bld: 123 mg/dL — ABNORMAL HIGH (ref 70–99)
Potassium: 4.5 mmol/L (ref 3.5–5.1)
Sodium: 141 mmol/L (ref 135–145)

## 2020-12-18 ENCOUNTER — Other Ambulatory Visit: Payer: Self-pay | Admitting: Orthopedic Surgery

## 2020-12-25 ENCOUNTER — Inpatient Hospital Stay: Admission: RE | Admit: 2020-12-25 | Payer: Medicare HMO | Source: Ambulatory Visit

## 2020-12-25 ENCOUNTER — Other Ambulatory Visit: Payer: Self-pay

## 2020-12-25 ENCOUNTER — Ambulatory Visit
Admission: RE | Admit: 2020-12-25 | Discharge: 2020-12-25 | Disposition: A | Discharge status: Home / Self Care | Payer: Medicare HMO | Source: Ambulatory Visit | Attending: Family Medicine | Admitting: Family Medicine

## 2020-12-25 DIAGNOSIS — Z1231 Encounter for screening mammogram for malignant neoplasm of breast: Secondary | ICD-10-CM | POA: Diagnosis not present

## 2020-12-25 NOTE — Progress Notes (Signed)
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  2 g/dL [Dr.Balch; elevated ESR]; June 2020 repeat M protein-negative  #Anemia hemoglobin 9.9/CKD stage III  #  Anterior mesenteric nodule- 16x35mm [sep 2016; smaller; Feb 2016- S/p Bx- leiomyoma.]; Ilecolic LN [87mm; sep 2016; improving].2016- Octreotide scan- NEG;  CT SEP 2017- NED [elevated chromogranin  Levels- slightly]; July 2019- CT STABLE; Oct 2019- [facility]- imaging reviewed; No further surveillaince  # CKD stage III/diabetes; hemoglobin 10 [EGD-2016; colo-2018-Dr.Elliot]  #April 2021-total thyroidectomy; [Dr.Cannon] Tumor Size: 0.8 cm in greatest dimension Histologic Type: Papillary carcinoma, classic (usual, conventional); stage I; no adjuvant therapy  # Retro-tracheal duplication cyst   Thyroid cancer (HCC)  03/28/2020 Initial Diagnosis   Thyroid cancer (HCC)       INTERVAL HISTORY: Patient is 65 year old female with history of iron deficiency anemia and anemia of chronic kidney disease who returns to clinic for follow up. She reports feeling at baseline. Denies bloody or black stools. No nausea, vomiting. Has ongoing leg cramps and fatigue. Not worse. Chronic problem.    Review of Systems  Constitutional: Positive for malaise/fatigue. Negative for chills, diaphoresis, fever and weight loss.  HENT: Negative for nosebleeds and sore throat.   Eyes: Negative for double vision.  Respiratory: Negative for cough, hemoptysis, sputum production, shortness of breath and wheezing.   Cardiovascular: Negative for chest pain, palpitations, orthopnea and leg swelling.  Gastrointestinal: Negative for abdominal pain, blood in stool, constipation, diarrhea, heartburn, melena, nausea and  vomiting.  Genitourinary: Negative for dysuria, frequency and urgency.  Musculoskeletal: Positive for back pain and joint pain.  Skin: Negative.  Negative for itching and rash.  Neurological: Negative for dizziness, tingling, focal weakness, weakness and headaches.  Endo/Heme/Allergies: Does not bruise/bleed easily.  Psychiatric/Behavioral: Negative for depression. The patient is not nervous/anxious and does not have insomnia.     PAST MEDICAL HISTORY :  Past Medical History:  Diagnosis Date  . Anemia    iron anemia, now getting infusions  . Anemia of chronic kidney failure, stage 3 (moderate) (HCC) 08/17/2019  . Arthritis   . BBB (bundle branch block)   . Bradycardia   . Coronary artery disease   . Diabetes mellitus without complication (HCC)   . 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 Dysphagia   . Dyspnea    recent complaints of sob  . Dysrhythmia   . GERD (gastroesophageal reflux disease)   . Headache   . History of hiatal hernia   . History of shingles   . Hypertension   . IBS (irritable bowel syndrome)   . Mesenteric mass   . Peripheral vascular disease (HCC)   . Rotator cuff tendinitis, left   . Unspecified atrial fibrillation (HCC)     PAST SURGICAL HISTORY :   Past Surgical History:  Procedure Laterality Date  . ABDOMINAL HYSTERECTOMY    . CHOLECYSTECTOMY    . COLONOSCOPY WITH PROPOFOL N/A 12/20/2016   Procedure: COLONOSCOPY WITH PROPOFOL;  Surgeon: Scot Jun, MD;  Location: [facility] ENDOSCOPY;  Service: Endoscopy;  Laterality: N/A;  . CORONARY ANGIOPLASTY    . CORONARY ARTERY BYPASS GRAFT  2002  . ESOPHAGOGASTRODUODENOSCOPY (EGD) WITH PROPOFOL N/A 04/27/2017   Procedure: ESOPHAGOGASTRODUODENOSCOPY (EGD) WITH PROPOFOL;  Surgeon: Scot Jun, MD;  Location: [facility] ENDOSCOPY;  Service: Endoscopy;  Laterality: N/A;  . EUS N/A 04/24/2015   Procedure: ESOPHAGEAL ENDOSCOPIC ULTRASOUND (EUS) RADIAL;  Surgeon: Wayland Salinas, MD;   Location[facility] ENDOSCOPY;  Service: Endoscopy;  Laterality: N/A;  . THYROIDECTOMY, PARTIAL      FAMILY HISTORY :   Family History  Problem Relation Age of Onset  . Breast cancer Neg Hx     SOCIAL HISTORY:   Social History   Tobacco Use  . Smoking status: Never Smoker  . Smokeless tobacco: Never Used  Vaping Use  . Vaping Use: Never used  Substance Use Topics  . Alcohol use: No    Alcohol/week: 0.0 standard drinks  . Drug use: No    ALLERGIES:  is allergic to other, iodinated diagnostic agents, oxycodone-acetaminophen, and etodolac.  MEDICATIONS:  Current Outpatient Medications  Medication Sig Dispense Refill  . amLODipine (NORVASC) 5 MG tablet Take 5 mg by mouth daily.     Marland Kitchen aspirin EC 81 MG tablet Take 81 mg by mouth daily.    . clotrimazole (LOTRIMIN) 1 % cream Apply 1 application topically 2 (two) times daily as needed (irritation).     . CONTOUR NEXT TEST test strip     . dicyclomine (BENTYL) 20 MG tablet Take 20 mg by mouth 2 (two) times daily as needed for spasms.     . fluticasone (FLONASE) 50 MCG/ACT nasal spray Place 2 sprays into both nostrils daily as needed for allergies.     . furosemide (LASIX) 40 MG tablet Take 40 mg by mouth daily.     Marland Kitchen gabapentin (NEURONTIN) 300 MG capsule Take 300 mg by mouth daily.    . insulin glargine (LANTUS SOLOSTAR) 100 UNIT/ML Solostar Pen Inject 35 Units into the skin at bedtime.    . Iron-Vitamin C (VITRON-C) 65-125 MG TABS Take 1 tablet by mouth daily.    Marland Kitchen levothyroxine (SYNTHROID) 137 MCG tablet Take 137 mcg by mouth daily before breakfast.    . Magnesium 250 MG TABS Take 250 mg by mouth daily.    Marland Kitchen omeprazole (PRILOSEC) 40 MG capsule Take 40 mg by mouth daily.    . sotalol (BETAPACE) 80 MG tablet Take 80 mg by mouth 2 (two) times daily.    Marland Kitchen spironolactone (ALDACTONE) 50 MG tablet Take 50 mg by mouth daily.     Marland Kitchen telmisartan (MICARDIS) 80 MG tablet Take 80 mg by mouth daily.    Marland Kitchen FIASP 100 UNIT/ML SOLN Inject 5 Units into  the skin in the morning, at noon, and at bedtime.    . Menthol-Methyl Salicylate (MUSCLE RUB) 10-15 % CREA Apply 1 application topically 4 (four) times daily as needed for muscle pain.    . metFORMIN (GLUCOPHAGE) 500 MG tablet Take 500-1,000 mg by mouth See admin instructions. Take 1 tablet (500 mg) by mouth in the morning & take 2 tablets (1000 mg) by mouth at night.    . Multiple Vitamin (MULTIVITAMIN WITH MINERALS) TABS tablet Take 1 tablet by mouth daily.    . pravastatin (PRAVACHOL) 40 MG tablet Take 40 mg by mouth daily.     No current facility-administered medications for this visit.    PHYSICAL EXAMINATION: ECOG PERFORMANCE STATUS: 0 - Asymptomatic  BP (!) 148/80 (BP Location: Left Arm, Cuff Size: Normal)   Pulse (!) 55   Temp (!) 96.6 F (35.9 C)   Wt 244 lb 9.6 oz (110.9 kg)   SpO2 100%   BMI 38.31 kg/m   [employer]  11/28/20 1102  Weight: 244 lb 9.6 oz (110.9 kg)    Physical Exam Constitutional:      Comments: Patient is alone.  HENT:     Head: Normocephalic and atraumatic.     Mouth/Throat:     Pharynx: No oropharyngeal exudate.  Eyes:     Pupils: Pupils are equal, round, and reactive to light.  Cardiovascular:     Rate and Rhythm: Normal rate and regular rhythm.  Pulmonary:     Effort: No respiratory distress.     Breath sounds: No wheezing.  Abdominal:     General: Bowel sounds are normal. There is no distension.     Palpations: Abdomen is soft. There is no mass.     Tenderness: There is no abdominal tenderness. There is no guarding or rebound.  Musculoskeletal:        General: No tenderness. Normal range of motion.     Cervical back: Normal range of motion and neck supple.  Skin:    General: Skin is warm.  Neurological:     Mental Status: She is alert and oriented to person, place, and time.  Psychiatric:        Mood and Affect: Affect normal.    LABORATORY DATA:  I have reviewed the data as listed    Component Value Date/Time   NA 141  11/28/2020 1025   NA 136 03/10/2015 1640   K 4.5 11/28/2020 1025   K 2.7 (L) 03/10/2015 1640   CL 104 11/28/2020 1025   CL 96 (L) 03/10/2015 1640   CO2 26 11/28/2020 1025   CO2 31 03/10/2015 1640   GLUCOSE 123 (H) 11/28/2020 1025   GLUCOSE 99 03/10/2015 1640   BUN 38 (H) 11/28/2020 1025   BUN 16 03/10/2015 1640   CREATININE 1.71 (H) 11/28/2020 1025   CREATININE 1.18 (H) 03/10/2015 1640   CALCIUM 8.9 11/28/2020 1025   CALCIUM 9.0 03/10/2015 1640   PROT 7.2 01/29/2020 1755   PROT 7.7 03/10/2015 1640   ALBUMIN 3.8 03/15/2020 0552   ALBUMIN 4.1 03/10/2015 1640   AST 17 01/29/2020 1755   AST 34 03/10/2015 1640   ALT 15 01/29/2020 1755   ALT 39 03/10/2015 1640   ALKPHOS 75 01/29/2020 1755   ALKPHOS 60 03/10/2015 1640   BILITOT 0.4 01/29/2020 1755   BILITOT 0.4 03/10/2015 1640   GFRNONAA 33 (L) 11/28/2020 1025   GFRNONAA 50 (L) 03/10/2015 1640   GFRAA 39 (L) 07/25/2020 1252   GFRAA 58 (L) 03/10/2015 1640    No results found for: SPEP, UPEP  Lab Results  Component Value Date   WBC 6.8 11/28/2020   NEUTROABS 3.7 11/28/2020   HGB 12.0 11/28/2020   HCT 37.7 11/28/2020   MCV 86.9 11/28/2020   PLT 256 11/28/2020      Chemistry      Component Value Date/Time   NA 141 11/28/2020 1025   NA 136 03/10/2015 1640   K 4.5 11/28/2020 1025   K 2.7 (L) 03/10/2015 1640   CL 104 11/28/2020 1025   CL 96 (L) 03/10/2015 1640   CO2 26 11/28/2020 1025   CO2 31 03/10/2015 1640   BUN 38 (H) 11/28/2020 1025   BUN 16 03/10/2015 1640   CREATININE 1.71 (H) 11/28/2020 1025   CREATININE 1.18 (H) 03/10/2015 1640      Component Value Date/Time   CALCIUM 8.9 11/28/2020 1025   CALCIUM 9.0 03/10/2015 1640   ALKPHOS 75 01/29/2020 1755   ALKPHOS 60 03/10/2015 1640  AST 17 01/29/2020 1755   AST 34 03/10/2015 1640   ALT 15 01/29/2020 1755   ALT 39 03/10/2015 1640   BILITOT 0.4 01/29/2020 1755   BILITOT 0.4 03/10/2015 1640      ASSESSMENT & PLAN:  1. Anemia-  Hemoglobin 12/ hct 37.7.  Hold EPO or iron at this time.   2. CKD- stage III. GFR 33, Cr 1.71. Baseline but slightly elevated compared to last year. Continue follow up with [facility].   3. S/p thyroidectomy thyroid Tumor Size: 0.8 cm in greatest dimension  Histologic Type: Papillary carcinoma, classic (usual, conventional).  No role for any adjuvant therapy; followed by endocrinology.  4. IV access: Poor/hydration  DISPOSITION:  RTC in 6 months for labs (cbc, bmp, iron studies, ferritin), MD/NP and possible venofer.   No problem-specific Assessment & Plan notes found for this encounter.  Consuello Masse, DNP, AGNP-[facility] [phone] (clinic)

## 2020-12-30 ENCOUNTER — Other Ambulatory Visit: Payer: Medicare HMO

## 2021-01-01 ENCOUNTER — Inpatient Hospital Stay: Admission: RE | Admit: 2021-01-01 | Payer: Medicare HMO | Source: Home / Self Care | Admitting: Orthopedic Surgery

## 2021-01-01 ENCOUNTER — Encounter: Admission: RE | Payer: Self-pay | Source: Home / Self Care

## 2021-01-01 SURGERY — ARTHROPLASTY, HIP, TOTAL, ANTERIOR APPROACH
Anesthesia: Choice | Site: Hip | Laterality: Left

## 2021-02-09 ENCOUNTER — Other Ambulatory Visit: Payer: Self-pay | Admitting: Orthopedic Surgery

## 2021-02-17 ENCOUNTER — Other Ambulatory Visit: Payer: Self-pay

## 2021-02-17 ENCOUNTER — Other Ambulatory Visit
Admission: RE | Admit: 2021-02-17 | Discharge: 2021-02-17 | Disposition: A | Discharge status: Home / Self Care | Payer: Medicare HMO | Source: Ambulatory Visit | Attending: Orthopedic Surgery | Admitting: Orthopedic Surgery

## 2021-02-17 DIAGNOSIS — Z01812 Encounter for preprocedural laboratory examination: Secondary | ICD-10-CM | POA: Diagnosis not present

## 2021-02-17 LAB — CBC WITH DIFFERENTIAL/PLATELET
Abs Immature Granulocytes: 0.02 10*3/uL (ref 0.00–0.07)
Basophils Absolute: 0.1 10*3/uL (ref 0.0–0.1)
Basophils Relative: 1 %
Eosinophils Absolute: 0.3 10*3/uL (ref 0.0–0.5)
Eosinophils Relative: 4 %
HCT: 36.4 % (ref 36.0–46.0)
Hemoglobin: 11.6 g/dL — ABNORMAL LOW (ref 12.0–15.0)
Immature Granulocytes: 0 %
Lymphocytes Relative: 27 %
Lymphs Abs: 2 10*3/uL (ref 0.7–4.0)
MCH: 27.4 pg (ref 26.0–34.0)
MCHC: 31.9 g/dL (ref 30.0–36.0)
MCV: 85.8 fL (ref 80.0–100.0)
Monocytes Absolute: 0.5 10*3/uL (ref 0.1–1.0)
Monocytes Relative: 7 %
Neutro Abs: 4.5 10*3/uL (ref 1.7–7.7)
Neutrophils Relative %: 61 %
Platelets: 278 10*3/uL (ref 150–400)
RBC: 4.24 MIL/uL (ref 3.87–5.11)
RDW: 13.8 % (ref 11.5–15.5)
WBC: 7.4 10*3/uL (ref 4.0–10.5)
nRBC: 0 % (ref 0.0–0.2)

## 2021-02-17 LAB — URINALYSIS, ROUTINE W REFLEX MICROSCOPIC
Bilirubin Urine: NEGATIVE
Glucose, UA: NEGATIVE mg/dL
Hgb urine dipstick: NEGATIVE
Ketones, ur: NEGATIVE mg/dL
Leukocytes,Ua: NEGATIVE
Nitrite: NEGATIVE
Protein, ur: NEGATIVE mg/dL
Specific Gravity, Urine: 1.009 (ref 1.005–1.030)
pH: 5 (ref 5.0–8.0)

## 2021-02-17 LAB — COMPREHENSIVE METABOLIC PANEL
ALT: 17 U/L (ref 0–44)
AST: 21 U/L (ref 15–41)
Albumin: 4.1 g/dL (ref 3.5–5.0)
Alkaline Phosphatase: 77 U/L (ref 38–126)
Anion gap: 10 (ref 5–15)
BUN: 39 mg/dL — ABNORMAL HIGH (ref 8–23)
CO2: 24 mmol/L (ref 22–32)
Calcium: 9.5 mg/dL (ref 8.9–10.3)
Chloride: 106 mmol/L (ref 98–111)
Creatinine, Ser: 1.38 mg/dL — ABNORMAL HIGH (ref 0.44–1.00)
GFR, Estimated: 42 mL/min — ABNORMAL LOW (ref >60)
Glucose, Bld: 95 mg/dL (ref 70–99)
Potassium: 4 mmol/L (ref 3.5–5.1)
Sodium: 140 mmol/L (ref 135–145)
Total Bilirubin: 0.7 mg/dL (ref 0.3–1.2)
Total Protein: 8 g/dL (ref 6.5–8.1)

## 2021-02-17 LAB — SURGICAL PCR SCREEN
MRSA, PCR: NEGATIVE
Staphylococcus aureus: NEGATIVE

## 2021-02-17 LAB — TYPE AND SCREEN
ABO/RH(D): B POS
Antibody Screen: NEGATIVE

## 2021-02-17 NOTE — Pre-Procedure Instructions (Signed)
Will stop Aspirin 81 mg 5 days prior to the surgery per Atlee Abide NP

## 2021-02-17 NOTE — Patient Instructions (Addendum)
Your procedure is scheduled on: Tues 4/5 Report to Registration Desk... Day Surgery. To find out your arrival time please call [phone] between 1PM - 3PM on Mon 4/4  Remember: Instructions that are not followed completely may result in serious medical risk,  up to and including death, or upon the discretion of your surgeon and anesthesiologist your  surgery may need to be rescheduled.     _X__ 1. Do not eat food after midnight the night before your procedure.                 No chewing gum or hard candies. You may drink clear liquids up to 2 hours                 before you are scheduled to arrive for your surgery- DO not drink clear                 liquids within 2 hours of the start of your surgery.                 Clear Liquids include:  water,Black Coffee or Tea (Do not add                 anything to coffee or tea). _____2.   Complete the "Ensure Clear Pre-surgery Clear Carbohydrate Drink" provided to you, 2 hours before arrival. **If you       are diabetic you will be provided with an alternative drink, Gatorade Zero or G2.  __X__2.  On the morning of surgery brush your teeth with toothpaste and water, you                may rinse your mouth with mouthwash if you wish.  Do not swallow any toothpaste of mouthwash.     ___ 3.  No Alcohol for 24 hours before or after surgery.   ___ 4.  Do Not Smoke or use e-cigarettes For 24 Hours Prior to Your Surgery.                 Do not use any chewable tobacco products for at least 6 hours prior to                 Surgery.  ___  5.  Do not use any recreational drugs (marijuana, cocaine, heroin, ecstasy, MDMA or other)                For at least one week prior to your surgery.  Combination of these drugs with anesthesia                May have life threatening results.  ____  6.  Bring all medications with you on the day of surgery if instructed.   __x__  7.  Notify your doctor if there is any change in your  medical condition      (cold, fever, infections).     Do not wear jewelry, make-up, hairpins, clips or nail polish. Do not wear lotions, powders, or perfumes. You may wear deodorant. Do not shave 48 hours prior to surgery.  Do not bring valuables to the hospital.    [facility] is not responsible for any belongings or valuables.  Contacts, dentures or bridgework may not be worn into surgery. Leave your suitcase in the car. After surgery it may be brought to your room. For patients admitted to the hospital, discharge time is determined by your treatment team.   Patients discharged the day  of surgery will not be allowed to drive home.   Make arrangements for someone to be with you for the first 24 hours of your Same Day Discharge.    Please read over the following fact sheets that you were given:       _x___ Take these medicines the morning of surgery with A SIP OF WATER:    1.amLODipine (NORVASC) 5 MG tablet  2. dicyclomine (BENTYL) 20 MG tablet if needed  3. gabapentin (NEURONTIN) 300 MG capsule  4.levothyroxine (SYNTHROID) 137 MCG tablet  5.pravastatin (PRAVACHOL) 40 MG tablet  6.sotalol (BETAPACE) 80 MG tablet             7.omeprazole (PRILOSEC) 40 MG capsule  Dose the night before and morning of surgery ____ Fleet Enema (as directed)   __x__ Use CHG Soap (or wipes) as directed  ____ Use Benzoyl Peroxide Gel as instructed  ____ Use inhalers on the day of surgery  __x__ Stop metformin 2 days prior to surgery last dose on 4/2    __x__ Take 1/2 of usual insulin dose the night before surgery. No insulin the morning          of surgery.   __x__ Stop aspirin on 3/31  ____ Stop Anti-inflammatories on    ____ Stop supplements until after surgery.    ____ Bring C-Pap to the hospital.    If you have any questions regarding your pre-procedure instructions,  Please call Pre-admit Testing at [facility]

## 2021-02-18 ENCOUNTER — Encounter: Payer: Self-pay | Admitting: Orthopedic Surgery

## 2021-02-18 NOTE — Progress Notes (Signed)
Perioperative Services  Pre-Admission/Anesthesia Testing Clinical Review  Date: 02/18/21  Patient Demographics:  Name: Hailey Howard DOB:   1956-03-05 MRN:   [id number]  Planned Surgical Procedure(s):    Case: [id number] Date/Time: 02/24/21 1218   Procedure: TOTAL HIP ARTHROPLASTY ANTERIOR APPROACH (Left Hip)   Anesthesia type: Choice   Pre-op diagnosis:      Primary localized osteoarthritis of left hip M16.12     Left hip pain M25.552   Location: [facility] OR ROOM 01 / [place] [facility]   Surgeons: Hessie Knows, MD    NOTE: Available PAT nursing documentation and vital signs have been reviewed. Clinical nursing staff has updated patient's PMH/PSHx, current medication list, and drug allergies/intolerances to ensure comprehensive history available to assist in medical decision making as it pertains to the aforementioned surgical procedure and anticipated anesthetic course.   Clinical Discussion:  Hailey Howard is a 65 y.o. female who is submitted for pre-surgical anesthesia review and clearance prior to her undergoing the above procedure. Patient has never been a smoker. Pertinent PMH includes: CAD (s/p CABG x 3), coronary artery dissection, atrial fibrillation, RBBB, LAFB, PVD, HTN, HLD, T2DM, CKD-III, DOE, dysphagia, GERD (on daily PPI), hiatal hernia, anemia (CKD and IDA), thyroid cancer (s/p thyroidectomy).  Patient is followed by cardiology Nehemiah Massed, MD). She was last seen in the cardiology clinic on 12/23/2020; notes reviewed.  At the time of her clinic visit, patient appears to be doing well from a cardiovascular standpoint.  Patient denied chest pain,, PND, orthopnea, palpitations, peripheral edema, vertiginous symptoms, or presyncope/syncope.  Patient denied shortness of breath at rest, however noted mild exertional dyspnea attributed to deconditioning and being overweight.  Functional capacity limited by orthopedic pain,, however cardiology provider feels that patient  able to achieve >4 METS.  Patient with a significant cardiovascular history as follows:   Patient underwent [facility] in [facility] that revealed significant CAD mainly involving the LAD and left circumflex.  During the course of the procedure, interventional cardiologist was unable to cross lesion with wire which resulted in an LAD coronary dissection.  Patient remained hemodynamically stable with slight discomfort.  IABP was inserted and patient was sent for emergency CABG.  CABG procedure resulted in a three-vessel bypass; SVG-OM 2, SVG-D2, and LIMA-LAD.   Repeat diagnostic heart catheterization performed in 01/2004 revealed three-vessel CAD.  Left ventricular ejection fraction 71%.  Balloon PTCA and DES was performed to a 95% stenosis within the mid LCx resulting in normal post interventional flow.  Postoperative TTE revealed normal left ventricular systolic function with mild tricuspid valve regurgitation and no evidence of valvular stenosis.   Most recent noninvasive cardiovascular testing includes a TTE performed on 02/25/2020 that revealed normal left ventricular systolic function with an estimated EF of 45-50%.  There was mild to moderate valvular insufficiency with no evidence of stenosis.  Subsequent myocardial perfusion imaging study revealed no evidence of stress-induced myocardial ischemia or arrhythmia; LVEF 58% (see full interpretation of cardiovascular testing and intervention below).  Patient also with a diagnosis of atrial fibrillation.  CHA2DS2-VASc Score = 5 (age, sex, HTN, PVD, T2DM).  Patient currently not anticoagulated.  She had been on apixaban in the past, however experienced palpitations and was transitioned to rivaroxaban.  Due to financial constraints, rivaroxaban subsequently stopped.  Education was provided regarding her 7.2% annual stroke risk, and patient advised that she would "consider" starting chronic anticoagulation, however ultimately deferred during her visit. Rate  and rhythm managed with sotalol.  She is  on GDMT for her HTN and HLD diagnoses.  Blood pressure reasonably controlled at 130/74 on currently prescribed CCB, diuretic, beta-blocker, and ARB therapies.  Patient is on a statin for her HLD. T2DM moderately controlled on currently prescribed regimen; Hgb A1c 8.3% when last checked on 01/27/2021. No changes were made to patient's medication regimen.  Patient to follow-up with outpatient cardiology in 3 months or sooner if needed.  Patient scheduled to undergo an elective orthopedic procedure on 02/24/2021 with Dr. Hessie Knows.  Given patient's past medical history significant for cardiovascular disease and multiple comorbidities, presurgical cardiac clearance was sought by the PAT team. Per cardiology, "this appears to be a non emergent moderate risk surgery without current cardiac contraindications.  Patient does not exhibit any signs of severe unstable angina, no recent MI, no decompensated/worsening heart failure, and no symptomatic arrhythmias.  MPI study from 02/19/2020 demonstrated normal perfusion without evidence of ischemia.  Patient able to achieve >4 METS at this time.  She is optimized from a cardiovascular standpoint and can proceed with surgery at a LOW risk for cardiovascular complications".  This patient is on daily antiplatelet therapy.  She has been instructed on recommendations from her cardiologist for holding her daily low-dose ASA for 5 days prior to her procedure with plans to restart as soon as postoperative bleeding risk minimized by her primary attending surgeon.  The patient has been instructed that her last dose of ASA will be on 02/18/2021.  Patient reports previous perioperative complications with anesthesia in the past. In review of the available records, it is noted that patient underwent a general anesthetic course here (ASA III) in 02/2020 without documented complications.   Vitals with BMI 02/17/2021 11/28/2020 09/18/2020  Height 5'  7" - 5\' 7"   Weight 245 lbs 244 lbs 10 oz 247 lbs 13 oz  BMI 78.58 - 85.0  Systolic 277 412 878  Diastolic 64 80 69  Pulse 55 55 58    Providers/Specialists:   NOTE: Primary physician provider listed below. Patient may have been seen by APP or partner within same practice.   PROVIDER ROLE / SPECIALTY LAST Fabio Bering, MD Orthopedics (Surgeon)  10/29/2020  Revelo, Elyse Jarvis, MD Primary Care Provider  ???  Serafina Royals, MD Cardiology  12/23/2020  Trinda Pascal, MD Nephrology  02/12/2021  Lavone Orn, MD Endocrinology  02/03/2021   Allergies:  Other, Iodinated diagnostic agents, Oxycodone-acetaminophen, and Etodolac  Current Home Medications:   No current facility-administered medications for this encounter.   Marland Kitchen amLODipine (NORVASC) 5 MG tablet  . aspirin EC 81 MG tablet  . clotrimazole (LOTRIMIN) 1 % cream  . dicyclomine (BENTYL) 20 MG tablet  . FIASP 100 UNIT/ML SOLN  . fluticasone (FLONASE) 50 MCG/ACT nasal spray  . furosemide (LASIX) 40 MG tablet  . gabapentin (NEURONTIN) 300 MG capsule  . insulin glargine (LANTUS SOLOSTAR) 100 UNIT/ML Solostar Pen  . Iron-Vitamin C (VITRON-C) 65-125 MG TABS  . levothyroxine (SYNTHROID) 137 MCG tablet  . Magnesium 250 MG TABS  . Menthol-Methyl Salicylate (MUSCLE RUB) 10-15 % CREA  . metFORMIN (GLUCOPHAGE) 500 MG tablet  . Multiple Vitamin (MULTIVITAMIN WITH MINERALS) TABS tablet  . omeprazole (PRILOSEC) 40 MG capsule  . pravastatin (PRAVACHOL) 40 MG tablet  . sotalol (BETAPACE) 80 MG tablet  . spironolactone (ALDACTONE) 50 MG tablet  . telmisartan (MICARDIS) 80 MG tablet  . CONTOUR NEXT TEST test strip   History:   Past Medical History:  Diagnosis Date  . Anemia  iron anemia, now getting infusions  . Anemia of chronic kidney failure, stage 3 (moderate) ([place]) 08/17/2019  . Arthritis   . Bradycardia   . CKD (chronic kidney disease), stage III ([place])   . Coronary artery disease   . Coronary artery dissection  05/03/2001   LAD during diagnostic PCI at [facility]  . Diabetes mellitus without complication ([place])   . DM type 2 with diabetic peripheral neuropathy ([place]) 09/19/2015  . Dysphagia   . Dysrhythmia   . Exertional dyspnea   . GERD (gastroesophageal reflux disease)   . Headache   . History of hiatal hernia   . History of shingles   . HLD (hyperlipidemia)   . 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 Hx of thyroid cancer    s/p thyroidectomy  . Hypertension   . IBS (irritable bowel syndrome)   . Mesenteric mass   . Peripheral vascular disease ([place])   . Right BBB/left ant fasc block   . Rotator cuff tendinitis, left   . Unspecified atrial fibrillation [facility])    Past Surgical History:  Procedure Laterality Date  . ABDOMINAL HYSTERECTOMY    . CHOLECYSTECTOMY    . COLONOSCOPY WITH PROPOFOL N/A 12/20/2016   Procedure: COLONOSCOPY WITH PROPOFOL;  Surgeon: Manya Silvas, MD;  Location: [facility] ENDOSCOPY;  Service: Endoscopy;  Laterality: N/A;  . CORONARY ANGIOPLASTY     stents  . CORONARY ARTERY BYPASS GRAFT  2002   3 vessels  . ESOPHAGOGASTRODUODENOSCOPY (EGD) WITH PROPOFOL N/A 04/27/2017   Procedure: ESOPHAGOGASTRODUODENOSCOPY (EGD) WITH PROPOFOL;  Surgeon: Manya Silvas, MD;  Location: [facility] ENDOSCOPY;  Service: Endoscopy;  Laterality: N/A;  . EUS N/A 04/24/2015   Procedure: ESOPHAGEAL ENDOSCOPIC ULTRASOUND (EUS) RADIAL;  Surgeon: Cora Daniels, MD;  Location[facility] END[facility]COPY;  Service: Endoscopy;  Laterality: N/A;  . THYROIDECTOMY, PARTIAL     Family History  Problem Relation Age of Onset  . Breast cancer Neg Hx    Social History   Tobacco Use  . Smoking status: Never Smoker  . Smokeless tobacco: Never Used  Vaping Use  . Vaping Use: Never used  Substance Use Topics  . Alcohol use: No    Alcohol/week: 0.0 standard drinks  . Drug use: No    Pertinent Clinical Results:  LABS: Labs reviewed: Acceptable for surgery.  Hospital Outpatient Visit on 02/17/2021   Component Date Value Ref Range Status  . WBC 02/17/2021 7.4  4.0 - 10.5 K/uL Final  . RBC 02/17/2021 4.24  3.87 - 5.11 MIL/uL Final  . Hemoglobin 02/17/2021 11.6* 12.0 - 15.0 g/dL Final  . HCT 02/17/2021 36.4  36.0 - 46.0 % Final  . MCV 02/17/2021 85.8  80.0 - 100.0 fL Final  . MCH 02/17/2021 27.4  26.0 - 34.0 pg Final  . MCHC 02/17/2021 31.9  30.0 - 36.0 g/dL Final  . RDW 02/17/2021 13.8  11.5 - 15.5 % Final  . Platelets 02/17/2021 278  150 - 400 K/uL Final  . nRBC 02/17/2021 0.0  0.0 - 0.2 % Final  . Neutrophils Relative % 02/17/2021 61  % Final  . Neutro Abs 02/17/2021 4.5  1.7 - 7.7 K/uL Final  . Lymphocytes Relative 02/17/2021 27  % Final  . Lymphs Abs 02/17/2021 2.0  0.7 - 4.0 K/uL Final  . Monocytes Relative 02/17/2021 7  % Final  . Monocytes Absolute 02/17/2021 0.5  0.1 - 1.0 K/uL Final  . Eosinophils Relative 02/17/2021 4  % Final  . Eosinophils Absolute  02/17/2021 0.3  0.0 - 0.5 K/uL Final  . Basophils Relative 02/17/2021 1  % Final  . Basophils Absolute 02/17/2021 0.1  0.0 - 0.1 K/uL Final  . Immature Granulocytes 02/17/2021 0  % Final  . Abs Immature Granulocytes 02/17/2021 0.02  0.00 - 0.07 K/uL Final   Performed at [facility], [address] [place], [place] [address]  . Sodium 02/17/2021 140  135 - 145 mmol/L Final  . Potassium 02/17/2021 4.0  3.5 - 5.1 mmol/L Final  . Chloride 02/17/2021 106  98 - 111 mmol/L Final  . CO2 02/17/2021 24  22 - 32 mmol/L Final  . Glucose, Bld 02/17/2021 95  70 - 99 mg/dL Final   Glucose reference range applies only to samples taken after fasting for at least 8 hours.  . BUN 02/17/2021 39* 8 - 23 mg/dL Final  . Creatinine, Ser 02/17/2021 1.38* 0.44 - 1.00 mg/dL Final  . Calcium 02/17/2021 9.5  8.9 - 10.3 mg/dL Final  . Total Protein 02/17/2021 8.0  6.5 - 8.1 g/dL Final  . Albumin 02/17/2021 4.1  3.5 - 5.0 g/dL Final  . AST 02/17/2021 21  15 - 41 U/L Final  . ALT 02/17/2021 17  0 - 44 U/L Final  . Alkaline Phosphatase  02/17/2021 77  38 - 126 U/L Final  . Total Bilirubin 02/17/2021 0.7  0.3 - 1.2 mg/dL Final  . GFR, Estimated 02/17/2021 42* >60 mL/min Final   Comment: (NOTE) Calculated using the CKD-EPI Creatinine Equation (2021)   . Anion gap 02/17/2021 10  5 - 15 Final   Performed at [facility], [address] [place], [place] [address]  . Color, Urine 02/17/2021 STRAW* YELLOW Final  . APPearance 02/17/2021 CLEAR* CLEAR Final  . Specific Gravity, Urine 02/17/2021 1.009  1.005 - 1.030 Final  . pH 02/17/2021 5.0  5.0 - 8.0 Final  . Glucose, UA 02/17/2021 NEGATIVE  NEGATIVE mg/dL Final  . Hgb urine dipstick 02/17/2021 NEGATIVE  NEGATIVE Final  . Bilirubin Urine 02/17/2021 NEGATIVE  NEGATIVE Final  . Ketones, ur 02/17/2021 NEGATIVE  NEGATIVE mg/dL Final  . Protein, ur 02/17/2021 NEGATIVE  NEGATIVE mg/dL Final  . Nitrite 02/17/2021 NEGATIVE  NEGATIVE Final  . Chalmers Guest 02/17/2021 NEGATIVE  NEGATIVE Final   Performed at [facility], [address] [place], [place] [address]  . ABO/RH(D) 02/17/2021 B POS   Final  . Antibody Screen 02/17/2021 NEG   Final  . Sample Expiration 02/17/2021 03/03/2021,2359   Final  . Extend sample reason 02/17/2021    Final                   Value:NO TRANSFUSIONS OR PREGNANCY IN THE PAST 3 MONTHS Performed at [facility], [address] [place], [place] [address]   . MRSA, PCR 02/17/2021 NEGATIVE  NEGATIVE Final  . Staphylococcus aureus 02/17/2021 NEGATIVE  NEGATIVE Final   Comment: (NOTE) The Xpert SA Assay (FDA approved for NASAL specimens in patients 68 years of age and older), is one component of a comprehensive surveillance program. It is not intended to diagnose infection nor to guide or monitor treatment. Performed at [facility], [address] [place], [place] [address]     ECG: Date: 032/11/2020 Rate: 60 bpm Rhythm: Sinus rhythm with first-degree AV block, RBBB, LAFB Intervals: PR 214 ms. QRS 148 ms. QTc  502 ms. ST segment and T wave changes: No evidence of acute ST segment elevation or depression Comparison: Similar to previous  tracing obtained on 09/10/2020 NOTE: Tracing obtained at [facility]; unable for review. Above based on cardiologist's interpretation.    IMAGING / PROCEDURES: LEXISCAN performed on 02/25/2020 1. LVEF 58% 2. Regional wall motion reveals myocardial thickening and wall motion 3. No artifacts noted 4. Left ventricular cavity size normal 5. No evidence of stress-induced myocardial ischemia or arrhythmia The overall quality of the study is good  ECHOCARDIOGRAM performed on 02/25/2020 1. Normal left ventricular systolic function with an estimated EF of 45-50% 2. Normal right ventricular systolic function 3. Moderate tricuspid valve insufficiency 4. Mild to moderate mitral valve insufficiency 5. Trace aortic valve insufficiency 6. No valvular stenosis 7. Mild right ventricular enlargement 8. Mild biatrial enlargement 9. Mild LVH 10. No evidence of pericardial effusion  LEFT HEART CATHETERIZATION AND CORONARY ANGIOGRAPHY performed on 02/12/2004 1. LVEF 71% 2. 25% stenosis proximal RCA 3. 25% stenosis mid RCA 4. 95% stenosis RPDA 5. 25% stenosis LM 6. 25% stenosis proximal LCx 7. 95% stenosis mid LCx 8. 50% stenosis OM 3 9. 25% stenosis proximal LAD 10. 100% stenosis mid LAD 11. 25% stenosis D2 12. PCTA and PCI performed 95% stenotic lesion in the LCx using a 3 x 13 mm Cypher drug-eluting stent.  CORONARY ARTERY BYPASS GRAFTING performed on 05/03/2001 1. LVEF >50% 2. Grafts x 3 placed   SV to OM 2  SVG to D2  LIMA to LAD  LEFT HEART CATHETERIZATION AND CORONARY ANGIOGRAPHY performed on 05/03/2001 1. 95% stenosis proximal LCx 2. 95% and 75% lesions in the mid LAD 3. 95% stenosis of the D2 4. RCA not evaluated 5. Left main normal 6. Unable to cross lesion with wire resulting in a LAD coronary dissection.    Patient remained hemodynamically  stable with slight discomfort and no EKG changes.    Patient sent for emergency CABG after IABP insertion.  Impression and Plan:  Hailey Howard has been referred for pre-anesthesia review and clearance prior to her undergoing the planned anesthetic and procedural courses. Available labs, pertinent testing, and imaging results were personally reviewed by me. This patient has been appropriately cleared by cardiology with an overall LOW risk of significant perioperative cardiovascular complications.  Based on clinical review performed today (02/18/21), barring any significant acute changes in the patient's overall condition, it is anticipated that she will be able to proceed with the planned surgical intervention. Any acute changes in clinical condition may necessitate her procedure being postponed and/or cancelled. Pre-surgical instructions were reviewed with the patient during her PAT appointment and questions were fielded by PAT clinical staff.  Honor Loh, MSN, APRN, FNP-C, CEN [facility]  Peri-operative Services Nurse Practitioner Phone: [phone] 02/18/21 9:15 AM  NOTE: This note has been prepared using Dragon dictation software. Despite my best ability to proofread, there is always the potential that unintentional transcriptional errors may still occur from this process.

## 2021-02-20 ENCOUNTER — Other Ambulatory Visit: Payer: Self-pay

## 2021-02-20 ENCOUNTER — Other Ambulatory Visit
Admission: RE | Admit: 2021-02-20 | Discharge: 2021-02-20 | Disposition: A | Discharge status: Home / Self Care | Payer: Medicare HMO | Source: Ambulatory Visit | Attending: Orthopedic Surgery | Admitting: Orthopedic Surgery

## 2021-02-20 DIAGNOSIS — Z01812 Encounter for preprocedural laboratory examination: Secondary | ICD-10-CM | POA: Diagnosis present

## 2021-02-20 DIAGNOSIS — Z20822 Contact with and (suspected) exposure to covid-19: Secondary | ICD-10-CM | POA: Diagnosis not present

## 2021-02-21 LAB — SARS CORONAVIRUS 2 (TAT 6-24 HRS): SARS Coronavirus 2: NEGATIVE

## 2021-02-24 ENCOUNTER — Observation Stay
Admission: RE | Admit: 2021-02-24 | Discharge: 2021-02-26 | Disposition: A | Discharge status: Home / Self Care | Payer: Medicare HMO | Attending: Orthopedic Surgery | Admitting: Orthopedic Surgery

## 2021-02-24 ENCOUNTER — Ambulatory Visit: Payer: Medicare HMO | Admitting: Urgent Care

## 2021-02-24 ENCOUNTER — Observation Stay: Payer: Medicare HMO

## 2021-02-24 ENCOUNTER — Encounter: Payer: Self-pay | Admitting: Orthopedic Surgery

## 2021-02-24 ENCOUNTER — Ambulatory Visit: Payer: Medicare HMO

## 2021-02-24 ENCOUNTER — Encounter
Admission: RE | Disposition: A | Discharge status: Home / Self Care | Payer: Self-pay | Source: Home / Self Care | Attending: Orthopedic Surgery

## 2021-02-24 ENCOUNTER — Other Ambulatory Visit: Payer: Self-pay

## 2021-02-24 DIAGNOSIS — N183 Chronic kidney disease, stage 3 unspecified: Secondary | ICD-10-CM | POA: Diagnosis not present

## 2021-02-24 DIAGNOSIS — M1612 Unilateral primary osteoarthritis, left hip: Secondary | ICD-10-CM | POA: Diagnosis not present

## 2021-02-24 DIAGNOSIS — Z79899 Other long term (current) drug therapy: Secondary | ICD-10-CM | POA: Insufficient documentation

## 2021-02-24 DIAGNOSIS — D631 Anemia in chronic kidney disease: Secondary | ICD-10-CM | POA: Diagnosis not present

## 2021-02-24 DIAGNOSIS — I129 Hypertensive chronic kidney disease with stage 1 through stage 4 chronic kidney disease, or unspecified chronic kidney disease: Secondary | ICD-10-CM | POA: Diagnosis not present

## 2021-02-24 DIAGNOSIS — E890 Postprocedural hypothyroidism: Secondary | ICD-10-CM | POA: Insufficient documentation

## 2021-02-24 DIAGNOSIS — Z419 Encounter for procedure for purposes other than remedying health state, unspecified: Secondary | ICD-10-CM

## 2021-02-24 DIAGNOSIS — Z96649 Presence of unspecified artificial hip joint: Secondary | ICD-10-CM

## 2021-02-24 DIAGNOSIS — G8918 Other acute postprocedural pain: Secondary | ICD-10-CM

## 2021-02-24 DIAGNOSIS — Z7982 Long term (current) use of aspirin: Secondary | ICD-10-CM | POA: Diagnosis not present

## 2021-02-24 DIAGNOSIS — E1122 Type 2 diabetes mellitus with diabetic chronic kidney disease: Secondary | ICD-10-CM | POA: Insufficient documentation

## 2021-02-24 DIAGNOSIS — Z85850 Personal history of malignant neoplasm of thyroid: Secondary | ICD-10-CM | POA: Insufficient documentation

## 2021-02-24 DIAGNOSIS — Z794 Long term (current) use of insulin: Secondary | ICD-10-CM | POA: Insufficient documentation

## 2021-02-24 DIAGNOSIS — Z951 Presence of aortocoronary bypass graft: Secondary | ICD-10-CM | POA: Insufficient documentation

## 2021-02-24 DIAGNOSIS — I2510 Atherosclerotic heart disease of native coronary artery without angina pectoris: Secondary | ICD-10-CM | POA: Insufficient documentation

## 2021-02-24 DIAGNOSIS — Z7984 Long term (current) use of oral hypoglycemic drugs: Secondary | ICD-10-CM | POA: Diagnosis not present

## 2021-02-24 HISTORY — DX: Other forms of dyspnea: R06.09

## 2021-02-24 HISTORY — DX: Bifascicular block: I45.2

## 2021-02-24 HISTORY — DX: Personal history of malignant neoplasm of thyroid: Z85.850

## 2021-02-24 HISTORY — PX: TOTAL HIP ARTHROPLASTY: SHX124

## 2021-02-24 HISTORY — DX: Dyspnea, unspecified: R06.00

## 2021-02-24 HISTORY — DX: Chronic kidney disease, stage 3 unspecified: N18.30

## 2021-02-24 HISTORY — DX: Hyperlipidemia, unspecified: E78.5

## 2021-02-24 LAB — CREATININE, SERUM
Creatinine, Ser: 1.34 mg/dL — ABNORMAL HIGH (ref 0.44–1.00)
GFR, Estimated: 43 mL/min — ABNORMAL LOW (ref >60)

## 2021-02-24 LAB — GLUCOSE, CAPILLARY
Glucose-Capillary: 140 mg/dL — ABNORMAL HIGH (ref 70–99)
Glucose-Capillary: 144 mg/dL — ABNORMAL HIGH (ref 70–99)
Glucose-Capillary: 282 mg/dL — ABNORMAL HIGH (ref 70–99)

## 2021-02-24 LAB — CBC
HCT: 33.3 % — ABNORMAL LOW (ref 36.0–46.0)
Hemoglobin: 10.5 g/dL — ABNORMAL LOW (ref 12.0–15.0)
MCH: 27.9 pg (ref 26.0–34.0)
MCHC: 31.5 g/dL (ref 30.0–36.0)
MCV: 88.3 fL (ref 80.0–100.0)
Platelets: 267 10*3/uL (ref 150–400)
RBC: 3.77 MIL/uL — ABNORMAL LOW (ref 3.87–5.11)
RDW: 13.6 % (ref 11.5–15.5)
WBC: 12.3 10*3/uL — ABNORMAL HIGH (ref 4.0–10.5)
nRBC: 0 % (ref 0.0–0.2)

## 2021-02-24 LAB — HEMOGLOBIN A1C
Hgb A1c MFr Bld: 7.8 % — ABNORMAL HIGH (ref 4.8–5.6)
Mean Plasma Glucose: 177.16 mg/dL

## 2021-02-24 SURGERY — ARTHROPLASTY, HIP, TOTAL, ANTERIOR APPROACH
Anesthesia: Spinal | Site: Hip | Laterality: Left

## 2021-02-24 MED ORDER — INSULIN ASPART (W/NIACINAMIDE) 100 UNIT/ML ~~LOC~~ SOLN: 5.0000 [IU] | Freq: Three times a day (TID) | SUBCUTANEOUS | Status: DC

## 2021-02-24 MED ORDER — CEFAZOLIN SODIUM-DEXTROSE 2-4 GM/100ML-% IV SOLN
2.0000 g | Freq: Four times a day (QID) | INTRAVENOUS | Status: AC
  Administered 2021-02-24 – 2021-02-25 (×2): 2 g via INTRAVENOUS
  Filled 2021-02-24 (×3): qty 100

## 2021-02-24 MED ORDER — CHLORHEXIDINE GLUCONATE 0.12 % MT SOLN: 15.0000 mL | Freq: Once | OROMUCOSAL | Status: AC

## 2021-02-24 MED ORDER — METFORMIN HCL 500 MG PO TABS
500.0000 mg | ORAL_TABLET | Freq: Every day | ORAL | Status: DC
  Administered 2021-02-25 – 2021-02-26 (×2): 500 mg via ORAL
  Filled 2021-02-24 (×2): qty 1

## 2021-02-24 MED ORDER — PHENYLEPHRINE HCL (PRESSORS) 10 MG/ML IV SOLN
INTRAVENOUS | Status: DC | PRN
  Administered 2021-02-24: 100 ug via INTRAVENOUS

## 2021-02-24 MED ORDER — ACETAMINOPHEN 325 MG PO TABS
325.0000 mg | ORAL_TABLET | Freq: Four times a day (QID) | ORAL | Status: DC | PRN
Start: 2021-02-25 — End: 2021-02-26

## 2021-02-24 MED ORDER — ONDANSETRON HCL 4 MG PO TABS: 4.0000 mg | ORAL_TABLET | Freq: Four times a day (QID) | ORAL | Status: DC | PRN

## 2021-02-24 MED ORDER — FENTANYL CITRATE (PF) 100 MCG/2ML IJ SOLN
INTRAMUSCULAR | Status: DC | PRN
  Administered 2021-02-24: 50 ug via INTRAVENOUS

## 2021-02-24 MED ORDER — FENTANYL CITRATE (PF) 100 MCG/2ML IJ SOLN: 25.0000 ug | INTRAMUSCULAR | Status: DC | PRN

## 2021-02-24 MED ORDER — BUPIVACAINE-EPINEPHRINE (PF) 0.25% -1:200000 IJ SOLN
INTRAMUSCULAR | Status: AC
  Filled 2021-02-24: qty 30

## 2021-02-24 MED ORDER — SOTALOL HCL 80 MG PO TABS
80.0000 mg | ORAL_TABLET | Freq: Two times a day (BID) | ORAL | Status: DC
  Administered 2021-02-24 – 2021-02-26 (×4): 80 mg via ORAL
  Filled 2021-02-24 (×5): qty 1

## 2021-02-24 MED ORDER — CEFAZOLIN SODIUM-DEXTROSE 2-4 GM/100ML-% IV SOLN
INTRAVENOUS | Status: AC
  Filled 2021-02-24: qty 100

## 2021-02-24 MED ORDER — MAGNESIUM CITRATE PO SOLN
1.0000 | Freq: Once | ORAL | Status: DC | PRN
  Filled 2021-02-24: qty 296

## 2021-02-24 MED ORDER — MORPHINE SULFATE (PF) 2 MG/ML IV SOLN
0.5000 mg | INTRAVENOUS | Status: DC | PRN
  Administered 2021-02-24: 1 mg via INTRAVENOUS
  Filled 2021-02-24: qty 1

## 2021-02-24 MED ORDER — HYDROCODONE-ACETAMINOPHEN 7.5-325 MG PO TABS
1.0000 | ORAL_TABLET | ORAL | Status: DC | PRN
  Administered 2021-02-25 (×4): 1 via ORAL
  Administered 2021-02-26: 2 via ORAL
  Filled 2021-02-24 (×3): qty 1
  Filled 2021-02-24: qty 2
  Filled 2021-02-24: qty 1

## 2021-02-24 MED ORDER — BUPIVACAINE LIPOSOME 1.3 % IJ SUSP
INTRAMUSCULAR | Status: AC
  Filled 2021-02-24: qty 20

## 2021-02-24 MED ORDER — CHLORHEXIDINE GLUCONATE 0.12 % MT SOLN
OROMUCOSAL | Status: AC
  Administered 2021-02-24: 15 mL via OROMUCOSAL
  Filled 2021-02-24: qty 15

## 2021-02-24 MED ORDER — ALUM & MAG HYDROXIDE-SIMETH 200-200-20 MG/5ML PO SUSP: 30.0000 mL | ORAL | Status: DC | PRN

## 2021-02-24 MED ORDER — SPIRONOLACTONE 25 MG PO TABS
50.0000 mg | ORAL_TABLET | Freq: Every day | ORAL | Status: DC
  Administered 2021-02-24 – 2021-02-26 (×3): 50 mg via ORAL
  Filled 2021-02-24 (×3): qty 2

## 2021-02-24 MED ORDER — PHENOL 1.4 % MT LIQD
1.0000 | OROMUCOSAL | Status: DC | PRN
  Filled 2021-02-24: qty 177

## 2021-02-24 MED ORDER — FUROSEMIDE 40 MG PO TABS
40.0000 mg | ORAL_TABLET | Freq: Every day | ORAL | Status: DC
  Administered 2021-02-24 – 2021-02-26 (×3): 40 mg via ORAL
  Filled 2021-02-24 (×3): qty 1

## 2021-02-24 MED ORDER — ENOXAPARIN SODIUM 40 MG/0.4ML ~~LOC~~ SOLN
40.0000 mg | SUBCUTANEOUS | Status: DC
  Administered 2021-02-25 – 2021-02-26 (×2): 40 mg via SUBCUTANEOUS
  Filled 2021-02-24 (×2): qty 0.4

## 2021-02-24 MED ORDER — METOCLOPRAMIDE HCL 10 MG PO TABS: 5.0000 mg | ORAL_TABLET | Freq: Three times a day (TID) | ORAL | Status: DC | PRN

## 2021-02-24 MED ORDER — CEFAZOLIN SODIUM-DEXTROSE 2-4 GM/100ML-% IV SOLN
2.0000 g | INTRAVENOUS | Status: AC
  Administered 2021-02-24: 2 g via INTRAVENOUS

## 2021-02-24 MED ORDER — SODIUM CHLORIDE 0.9 % IV SOLN: INTRAVENOUS | Status: DC

## 2021-02-24 MED ORDER — DOCUSATE SODIUM 100 MG PO CAPS
100.0000 mg | ORAL_CAPSULE | Freq: Two times a day (BID) | ORAL | Status: DC
  Administered 2021-02-24 – 2021-02-26 (×4): 100 mg via ORAL
  Filled 2021-02-24 (×4): qty 1

## 2021-02-24 MED ORDER — MIDAZOLAM HCL 2 MG/2ML IJ SOLN
INTRAMUSCULAR | Status: AC
  Filled 2021-02-24: qty 2

## 2021-02-24 MED ORDER — METHOCARBAMOL 1000 MG/10ML IJ SOLN
500.0000 mg | Freq: Four times a day (QID) | INTRAVENOUS | Status: DC | PRN
  Filled 2021-02-24: qty 5

## 2021-02-24 MED ORDER — METOCLOPRAMIDE HCL 5 MG/ML IJ SOLN: 5.0000 mg | Freq: Three times a day (TID) | INTRAMUSCULAR | Status: DC | PRN

## 2021-02-24 MED ORDER — MENTHOL 3 MG MT LOZG
1.0000 | LOZENGE | OROMUCOSAL | Status: DC | PRN
  Filled 2021-02-24: qty 9

## 2021-02-24 MED ORDER — FENTANYL CITRATE (PF) 100 MCG/2ML IJ SOLN
INTRAMUSCULAR | Status: AC
  Filled 2021-02-24: qty 2

## 2021-02-24 MED ORDER — IRBESARTAN 150 MG PO TABS
150.0000 mg | ORAL_TABLET | Freq: Every day | ORAL | Status: DC
  Administered 2021-02-24 – 2021-02-26 (×3): 150 mg via ORAL
  Filled 2021-02-24 (×3): qty 1

## 2021-02-24 MED ORDER — SODIUM CHLORIDE FLUSH 0.9 % IV SOLN
INTRAVENOUS | Status: AC
  Filled 2021-02-24: qty 40

## 2021-02-24 MED ORDER — MAGNESIUM OXIDE 400 (241.3 MG) MG PO TABS
400.0000 mg | ORAL_TABLET | Freq: Every day | ORAL | Status: DC
  Administered 2021-02-24 – 2021-02-26 (×3): 400 mg via ORAL
  Filled 2021-02-24 (×3): qty 1

## 2021-02-24 MED ORDER — BUPIVACAINE HCL (PF) 0.5 % IJ SOLN
INTRAMUSCULAR | Status: DC | PRN
  Administered 2021-02-24: 3 mL via INTRATHECAL

## 2021-02-24 MED ORDER — ADULT MULTIVITAMIN W/MINERALS CH
1.0000 | ORAL_TABLET | Freq: Every day | ORAL | Status: DC
  Administered 2021-02-24 – 2021-02-26 (×3): 1 via ORAL
  Filled 2021-02-24 (×3): qty 1

## 2021-02-24 MED ORDER — DICYCLOMINE HCL 20 MG PO TABS
20.0000 mg | ORAL_TABLET | Freq: Two times a day (BID) | ORAL | Status: DC | PRN
  Filled 2021-02-24: qty 1

## 2021-02-24 MED ORDER — PRAVASTATIN SODIUM 20 MG PO TABS
40.0000 mg | ORAL_TABLET | Freq: Every day | ORAL | Status: DC
  Administered 2021-02-24 – 2021-02-26 (×3): 40 mg via ORAL
  Filled 2021-02-24 (×3): qty 2

## 2021-02-24 MED ORDER — SODIUM CHLORIDE 0.9 % IV SOLN
INTRAVENOUS | Status: DC | PRN
  Administered 2021-02-24: 20 ug/min via INTRAVENOUS

## 2021-02-24 MED ORDER — PANTOPRAZOLE SODIUM 40 MG PO TBEC
80.0000 mg | DELAYED_RELEASE_TABLET | Freq: Every day | ORAL | Status: DC
  Administered 2021-02-25 – 2021-02-26 (×2): 80 mg via ORAL
  Filled 2021-02-24 (×2): qty 2

## 2021-02-24 MED ORDER — PROPOFOL 500 MG/50ML IV EMUL
INTRAVENOUS | Status: DC | PRN
  Administered 2021-02-24: 25 ug/kg/min via INTRAVENOUS

## 2021-02-24 MED ORDER — LEVOTHYROXINE SODIUM 137 MCG PO TABS
137.0000 ug | ORAL_TABLET | Freq: Every day | ORAL | Status: DC
  Administered 2021-02-25 – 2021-02-26 (×2): 137 ug via ORAL
  Filled 2021-02-24 (×2): qty 1

## 2021-02-24 MED ORDER — BUPIVACAINE-EPINEPHRINE 0.25% -1:200000 IJ SOLN
INTRAMUSCULAR | Status: DC | PRN
  Administered 2021-02-24: 30 mL

## 2021-02-24 MED ORDER — AMLODIPINE BESYLATE 5 MG PO TABS
5.0000 mg | ORAL_TABLET | Freq: Every day | ORAL | Status: DC
  Administered 2021-02-25 – 2021-02-26 (×2): 5 mg via ORAL
  Filled 2021-02-24 (×2): qty 1

## 2021-02-24 MED ORDER — INSULIN ASPART 100 UNIT/ML ~~LOC~~ SOLN
0.0000 [IU] | Freq: Three times a day (TID) | SUBCUTANEOUS | Status: DC
  Administered 2021-02-25: 3 [IU] via SUBCUTANEOUS
  Administered 2021-02-25 (×2): 8 [IU] via SUBCUTANEOUS
  Administered 2021-02-26: 3 [IU] via SUBCUTANEOUS
  Filled 2021-02-24 (×4): qty 1

## 2021-02-24 MED ORDER — DEXAMETHASONE SODIUM PHOSPHATE 10 MG/ML IJ SOLN
INTRAMUSCULAR | Status: DC | PRN
  Administered 2021-02-24: 10 mg via INTRAVENOUS

## 2021-02-24 MED ORDER — METFORMIN HCL 500 MG PO TABS: 500.0000 mg | ORAL_TABLET | ORAL | Status: DC

## 2021-02-24 MED ORDER — TRAMADOL HCL 50 MG PO TABS
50.0000 mg | ORAL_TABLET | Freq: Four times a day (QID) | ORAL | Status: DC
  Administered 2021-02-24 – 2021-02-26 (×8): 50 mg via ORAL
  Filled 2021-02-24 (×8): qty 1

## 2021-02-24 MED ORDER — ORAL CARE MOUTH RINSE: 15.0000 mL | Freq: Once | OROMUCOSAL | Status: AC

## 2021-02-24 MED ORDER — HYDROCODONE-ACETAMINOPHEN 5-325 MG PO TABS
1.0000 | ORAL_TABLET | ORAL | Status: DC | PRN
  Administered 2021-02-24 – 2021-02-26 (×4): 2 via ORAL
  Filled 2021-02-24 (×4): qty 2

## 2021-02-24 MED ORDER — EPHEDRINE 5 MG/ML INJ
INTRAVENOUS | Status: AC
  Filled 2021-02-24: qty 10

## 2021-02-24 MED ORDER — INSULIN GLARGINE 100 UNIT/ML ~~LOC~~ SOLN
35.0000 [IU] | Freq: Every day | SUBCUTANEOUS | Status: DC
  Administered 2021-02-24 – 2021-02-25 (×2): 35 [IU] via SUBCUTANEOUS
  Filled 2021-02-24 (×6): qty 0.35

## 2021-02-24 MED ORDER — METFORMIN HCL 500 MG PO TABS
1000.0000 mg | ORAL_TABLET | Freq: Every day | ORAL | Status: DC
  Administered 2021-02-25: 1000 mg via ORAL
  Filled 2021-02-24: qty 2

## 2021-02-24 MED ORDER — CLOTRIMAZOLE 1 % EX CREA
1.0000 "application " | TOPICAL_CREAM | Freq: Two times a day (BID) | CUTANEOUS | Status: DC
  Administered 2021-02-26: 1 via TOPICAL
  Filled 2021-02-24: qty 15

## 2021-02-24 MED ORDER — BISACODYL 10 MG RE SUPP: 10.0000 mg | Freq: Every day | RECTAL | Status: DC | PRN

## 2021-02-24 MED ORDER — METHOCARBAMOL 500 MG PO TABS
500.0000 mg | ORAL_TABLET | Freq: Four times a day (QID) | ORAL | Status: DC | PRN
  Administered 2021-02-24 – 2021-02-25 (×2): 500 mg via ORAL
  Filled 2021-02-24 (×2): qty 1

## 2021-02-24 MED ORDER — ONDANSETRON HCL 4 MG/2ML IJ SOLN: 4.0000 mg | Freq: Once | INTRAMUSCULAR | Status: DC | PRN

## 2021-02-24 MED ORDER — INSULIN ASPART 100 UNIT/ML ~~LOC~~ SOLN
5.0000 [IU] | Freq: Three times a day (TID) | SUBCUTANEOUS | Status: DC
  Administered 2021-02-24 – 2021-02-26 (×5): 5 [IU] via SUBCUTANEOUS
  Filled 2021-02-24 (×5): qty 1

## 2021-02-24 MED ORDER — ONDANSETRON HCL 4 MG/2ML IJ SOLN
INTRAMUSCULAR | Status: DC | PRN
  Administered 2021-02-24: 4 mg via INTRAVENOUS

## 2021-02-24 MED ORDER — GLYCOPYRROLATE 0.2 MG/ML IJ SOLN
INTRAMUSCULAR | Status: DC | PRN
  Administered 2021-02-24: .2 mg via INTRAVENOUS

## 2021-02-24 MED ORDER — ZOLPIDEM TARTRATE 5 MG PO TABS: 5.0000 mg | ORAL_TABLET | Freq: Every evening | ORAL | Status: DC | PRN

## 2021-02-24 MED ORDER — GABAPENTIN 300 MG PO CAPS
300.0000 mg | ORAL_CAPSULE | Freq: Every day | ORAL | Status: DC
  Administered 2021-02-25 – 2021-02-26 (×2): 300 mg via ORAL
  Filled 2021-02-24 (×2): qty 1

## 2021-02-24 MED ORDER — FLUTICASONE PROPIONATE 50 MCG/ACT NA SUSP
2.0000 | Freq: Every day | NASAL | Status: DC | PRN
  Filled 2021-02-24: qty 16

## 2021-02-24 MED ORDER — POLYSACCHARIDE IRON COMPLEX 150 MG PO CAPS
150.0000 mg | ORAL_CAPSULE | Freq: Every day | ORAL | Status: DC
  Administered 2021-02-24 – 2021-02-26 (×3): 150 mg via ORAL
  Filled 2021-02-24 (×4): qty 1

## 2021-02-24 MED ORDER — ONDANSETRON HCL 4 MG/2ML IJ SOLN
4.0000 mg | Freq: Four times a day (QID) | INTRAMUSCULAR | Status: DC | PRN
  Administered 2021-02-24: 4 mg via INTRAVENOUS

## 2021-02-24 MED ORDER — MIDAZOLAM HCL 5 MG/5ML IJ SOLN
INTRAMUSCULAR | Status: DC | PRN
  Administered 2021-02-24: 2 mg via INTRAVENOUS

## 2021-02-24 MED ORDER — SODIUM CHLORIDE 0.9 % IV SOLN
INTRAVENOUS | Status: DC | PRN
  Administered 2021-02-24: 60 mL

## 2021-02-24 MED ORDER — BUPIVACAINE HCL (PF) 0.5 % IJ SOLN
INTRAMUSCULAR | Status: AC
  Filled 2021-02-24: qty 10

## 2021-02-24 MED ORDER — MAGNESIUM HYDROXIDE 400 MG/5ML PO SUSP
30.0000 mL | Freq: Every day | ORAL | Status: DC | PRN
  Administered 2021-02-24: 30 mL via ORAL
  Filled 2021-02-24: qty 30

## 2021-02-24 MED ORDER — ASPIRIN EC 81 MG PO TBEC
81.0000 mg | DELAYED_RELEASE_TABLET | Freq: Every day | ORAL | Status: DC
  Administered 2021-02-24 – 2021-02-26 (×3): 81 mg via ORAL
  Filled 2021-02-24 (×3): qty 1

## 2021-02-24 SURGICAL SUPPLY — 61 items
BLADE SAGITTAL AGGR TOOTH XLG (BLADE) ×2 IMPLANT
BNDG COHESIVE 6X5 TAN STRL LF (GAUZE/BANDAGES/DRESSINGS) ×6 IMPLANT
CANISTER SUCT 1200ML W/VALVE (MISCELLANEOUS) ×2 IMPLANT
CANISTER WOUND CARE 500ML ATS (WOUND CARE) ×2 IMPLANT
CHLORAPREP W/TINT 26 (MISCELLANEOUS) ×2 IMPLANT
COVER BACK TABLE REUSABLE LG (DRAPES) ×2 IMPLANT
COVER WAND RF STERILE (DRAPES) ×2 IMPLANT
DRAPE 3/4 80X56 (DRAPES) ×6 IMPLANT
DRAPE C-ARM XRAY 36X54 (DRAPES) ×2 IMPLANT
DRAPE INCISE IOBAN 66X60 STRL (DRAPES) IMPLANT
DRAPE POUCH INSTRU U-SHP 10X18 (DRAPES) ×2 IMPLANT
DRESSING SURGICEL FIBRLLR 1X2 (HEMOSTASIS) ×2 IMPLANT
DRSG MEPILEX SACRM 8.7X9.8 (GAUZE/BANDAGES/DRESSINGS) ×2 IMPLANT
DRSG OPSITE POSTOP 4X8 (GAUZE/BANDAGES/DRESSINGS) ×4 IMPLANT
DRSG SURGICEL FIBRILLAR 1X2 (HEMOSTASIS) ×4
ELECT BLADE 6.5 EXT (BLADE) ×2 IMPLANT
ELECT REM PT RETURN 9FT ADLT (ELECTROSURGICAL) ×2
ELECTRODE REM PT RTRN 9FT ADLT (ELECTROSURGICAL) ×1 IMPLANT
GLOVE SURG SYN 9.0  PF PI (GLOVE) ×2
GLOVE SURG SYN 9.0 PF PI (GLOVE) ×2 IMPLANT
GLOVE SURG UNDER POLY LF SZ9 (GLOVE) ×2 IMPLANT
GOWN SRG 2XL LVL 4 RGLN SLV (GOWNS) ×1 IMPLANT
GOWN STRL NON-REIN 2XL LVL4 (GOWNS) ×1
GOWN STRL REUS W/ TWL LRG LVL3 (GOWN DISPOSABLE) ×1 IMPLANT
GOWN STRL REUS W/TWL LRG LVL3 (GOWN DISPOSABLE) ×1
HEAD FEMORAL 28MM SZ S (Head) ×2 IMPLANT
HEMOVAC 400CC 10FR (MISCELLANEOUS) IMPLANT
HIP DBL LINER 54X28 (Liner) ×2 IMPLANT
HOLDER FOLEY CATH W/STRAP (MISCELLANEOUS) ×2 IMPLANT
HOOD PEEL AWAY FLYTE STAYCOOL (MISCELLANEOUS) ×2 IMPLANT
IRRIGATION SURGIPHOR STRL (IV SOLUTION) IMPLANT
KIT PREVENA INCISION MGT 13 (CANNISTER) ×2 IMPLANT
MANIFOLD NEPTUNE II (INSTRUMENTS) ×2 IMPLANT
MASTERLOC HIP LATERAL S4 (Hips) ×2 IMPLANT
MAT ABSORB  FLUID 56X50 GRAY (MISCELLANEOUS) ×1
MAT ABSORB FLUID 56X50 GRAY (MISCELLANEOUS) ×1 IMPLANT
NDL SAFETY ECLIPSE 18X1.5 (NEEDLE) ×1 IMPLANT
NEEDLE HYPO 18GX1.5 SHARP (NEEDLE) ×1
NEEDLE SPNL 20GX3.5 QUINCKE YW (NEEDLE) ×4 IMPLANT
NS IRRIG 1000ML POUR BTL (IV SOLUTION) ×2 IMPLANT
PACK HIP COMPR (MISCELLANEOUS) ×2 IMPLANT
SCALPEL PROTECTED #10 DISP (BLADE) ×4 IMPLANT
SHELL ACETABULAR SZ 54 DM (Shell) ×2 IMPLANT
SOL PREP PVP 2OZ (MISCELLANEOUS) ×2
SOLUTION PREP PVP 2OZ (MISCELLANEOUS) ×1 IMPLANT
SPONGE DRAIN TRACH 4X4 STRL 2S (GAUZE/BANDAGES/DRESSINGS) ×2 IMPLANT
STAPLER SKIN PROX 35W (STAPLE) ×2 IMPLANT
STRAP SAFETY 5IN WIDE (MISCELLANEOUS) ×2 IMPLANT
SUT DVC 2 QUILL PDO  T11 36X36 (SUTURE) ×1
SUT DVC 2 QUILL PDO T11 36X36 (SUTURE) ×1 IMPLANT
SUT SILK 0 (SUTURE) ×1
SUT SILK 0 30XBRD TIE 6 (SUTURE) ×1 IMPLANT
SUT V-LOC 90 ABS DVC 3-0 CL (SUTURE) ×2 IMPLANT
SUT VIC AB 1 CT1 36 (SUTURE) ×2 IMPLANT
SYR 20ML LL LF (SYRINGE) ×2 IMPLANT
SYR 30ML LL (SYRINGE) ×2 IMPLANT
SYR 50ML LL SCALE MARK (SYRINGE) ×4 IMPLANT
SYR BULB IRRIG 60ML STRL (SYRINGE) ×2 IMPLANT
TAPE MICROFOAM 4IN (TAPE) ×2 IMPLANT
TOWEL OR 17X26 4PK STRL BLUE (TOWEL DISPOSABLE) ×2 IMPLANT
TRAY FOLEY MTR SLVR 16FR STAT (SET/KITS/TRAYS/PACK) ×2 IMPLANT

## 2021-02-24 NOTE — Anesthesia Procedure Notes (Signed)
Spinal  Patient location during procedure: OR Reason for block: surgical anesthesia Staffing Performed: resident/CRNA  Anesthesiologist: Alvin Critchley, MD Resident/CRNA: Rolla Plate, CRNA Preanesthetic Checklist Completed: patient identified, IV checked, site marked, risks and benefits discussed, surgical consent, monitors and equipment checked, pre-op evaluation and timeout performed Spinal Block Patient position: sitting Prep: ChloraPrep and site prepped and draped Patient monitoring: heart rate, continuous pulse ox, blood pressure and cardiac monitor Approach: midline Location: L4-5 Injection technique: single-shot Needle Needle type: [place]  Needle gauge: 22 G Needle length: 9 cm Assessment Events: CSF return Additional Notes Negative paresthesia. Negative blood return. Positive free-flowing CSF. Expiration date of kit checked and confirmed. Patient tolerated procedure well, without complications.

## 2021-02-24 NOTE — Transfer of Care (Signed)
Immediate Anesthesia Transfer of Care Note  Patient: Hailey Howard  Procedure(s) Performed: TOTAL HIP ARTHROPLASTY ANTERIOR APPROACH (Left Hip)  Patient Location: PACU  Anesthesia Type:Spinal  Level of Consciousness: drowsy  Airway & Oxygen Therapy: Patient Spontanous Breathing and Patient connected to face mask oxygen  Post-op Assessment: Report given to RN and Post -op Vital signs reviewed and stable  Post vital signs: stable  Last Vitals:  Vitals Value Taken Time  BP 130/64 02/24/21 1524  Temp    Pulse 58 02/24/21 1527  Resp 18 02/24/21 1527  SpO2 100 % 02/24/21 1527  Vitals shown include unvalidated device data.  Last Pain:  Vitals:   02/24/21 1106  TempSrc: Temporal  PainSc: 6          Complications: No complications documented.

## 2021-02-24 NOTE — H&P (Signed)
Chief Complaint  Patient presents with  . Follow-up  H&P Left total hip arthroplaty, scheduled for 02/24/2021    History of the Present Illness: Hailey Howard is a 65 y.o. female here today for history and physical for left total hip arthroplasty with Dr. Hessie Knows on 02/24/2021. Patient has x-ray showing advanced left hip osteoarthritis. She has had pain for greater than 3 years in the left hip that has interfered with quality of life and activities daily living. She has a hard time going up and down steps, bending forward to pick things up off the floor as well as putting on her socks and shoes. She is unable to walk greater than 1 block without having severe pain. Pain is severe and located in the left groin, buttocks and in the thigh. Patient has tried over-the-counter medications with no improvement. 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  The patient is a diabetic. She sees Germain Osgood, MD. Her last hemoglobin A1c was under 8. She has no history of blood clots. The patient states she weighs 244 pounds.   I have reviewed past medical, surgical, social and family history, and allergies as documented in the EMR.  Past Medical History: Past Medical History:  Diagnosis Date  . Abdominal hernia 1997  . Anemia  . Atrial fibrillation (CMS-HCC)  . BBB (bundle branch block)  . Blockage of coronary artery of heart (CMS-HCC)  . Chronic kidney disease, stage 3 (CMS-HCC)  . Colon polyp 2015  . Coronary artery disease  . Diabetic neuropathy (CMS-HCC)  . Gastritis 12/27/2014  nodular  . GERD (gastroesophageal reflux disease)  . Hyperlipidemia  . Hypertension  . Hyperthyroidism, unspecified  . Hyponatremia  . 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 IDA (iron deficiency anemia) 06/29/2019  . Irritable bowel syndrome with constipation 07/01/2019  . Multinodular goiter  . Obesity  . Shingles  . Supraventricular tachycardia (CMS-HCC)   . Thyroid cancer (CMS-HCC)  PTC 0.8 cm (thyroidectomy 03/14/2020)  . Thyroid cancer (CMS-HCC) 05/05/2020  . Type 2 diabetes mellitus (CMS-HCC)  complicated by nephropathy, neuropathy, retinopathy, and vascular disease.  . Ventricular tachycardia (CMS-HCC)  . Vitamin D deficiency   Past Surgical History: Past Surgical History:  Procedure Laterality Date  . CARDIAC CATHETERIZATION  . CARDIAC SURGERY  . CHOLECYSTECTOMY 2009  . Cholecystectomy 2009  . COLONOSCOPY 01/19/2007  [facility] (Mother) [facility]  . COLONOSCOPY 11/02/2013  Adenomatous Polyps, [facility] (Mother): [facility] 10/2016  . COLONOSCOPY 12/20/2016  [facility] Adenomatous Polyps, [facility] (Mother): [facility] 11/2021  . COLONOSCOPY 2015  . CORONARY ANGIOPLASTY  . CORONARY ARTERY BYPASS GRAFT 2002  . EGD 02/22/2011, 10/06/2009, 01/19/2007  [facility]  . EGD 12/27/2014  Gastritis: No repeat per [facility]  . EGD 04/27/2017  Gastritis: No repeat per [facility]  . HYSTERECTOMY  . Hysterectomy 1983  . HYSTERECTOMY VAGINAL  . Partial thyroidectomy 2010  . POLYPECTOMY 2015  . SIGMOIDOSCOPY 2014  . THYROID SURGERY  . THYROIDECTOMY TOTAL 03/14/2020  at [facility]. Surgeon: Dr Fredirick Maudlin.   Past Family History: Family History  Problem Relation Age of Onset  . Coronary Artery Disease (Blocked arteries around heart) Father  . Diabetes Father  . Gout Father  . High blood pressure (Hypertension) Mother  . Colon cancer Mother  . Diabetes Mother  . Kidney disease Mother  . Arthritis Mother  . Coronary Artery Disease (Blocked arteries around heart) Mother  . Kidney disease Brother  .  Arthritis Brother  . Stomach cancer Brother  . Diabetes Sister  . High blood pressure (Hypertension) Sister  . Arthritis Sister  . Myocardial Infarction (Heart attack) Sister  . Heart disease Sister  . Colon polyps Sister  . Diabetes Sister  . High blood pressure (Hypertension) Sister  . Diabetes Sister  . High blood pressure  (Hypertension) Sister  . Lupus Other   Medications: Current Outpatient Medications Ordered in Epic  Medication Sig Dispense Refill  . amLODIPine (NORVASC) 5 MG tablet Take 5 mg by mouth once daily  . aspirin 81 MG EC tablet Take 81 mg by mouth once daily  . cholecalciferol (CHOLECALCIFEROL) 1000 unit tablet Take by mouth Takes 2 tablets daily- dose unknown  . dicyclomine (BENTYL) 20 mg tablet Take 1 tablet (20 mg total) by mouth every 6 (six) hours as needed (abdominal pain/cramping) 360 tablet 1  . ferrous fumarate-vitamin C (VITRON-C) 65 mg iron- 125 mg tablet Take 1 tablet by mouth once daily  . FIASP U-100 INSULIN 100 unit/mL Soln Inject 8 units before breakfast and lunch and 12 units before supper. 15 mL 11  . fluticasone (FLONASE) 50 mcg/actuation nasal spray by Nasal route as needed  . FUROsemide (LASIX) 40 MG tablet Take 40 mg by mouth every other day  . gabapentin (NEURONTIN) 300 MG capsule 1 po qHS x 4 days, then bid (Patient taking differently: Take 300 mg by mouth once daily as needed 1 po qHS x 4 days, then bid ) 60 capsule 1  . LANTUS SOLOSTAR U-100 INSULIN pen injector (concentration 100 units/mL) Inject 35 Units subcutaneously nightly As directed. Max daily dose is 50 units. 90 mL 3  . levothyroxine (SYNTHROID) 137 MCG tablet TAKE 1 TABLET ONE TIME DAILY ON AN EMPTY STOMACH WITH A GLASS OF WATER AT LEAST 30 T0 60 MINUTES BEFORE BREAKFAST 90 tablet 3  . metFORMIN (GLUCOPHAGE) 500 MG tablet TAKE 1 TABLET EVERY MORNING WITH BREAKFAST AND 2 TABLETS EVERY AFTERNOON WITH SUPPER 270 tablet 3  . multivitamin tablet Take 1 tablet by mouth once daily  . omeprazole (PRILOSEC) 40 MG DR capsule TAKE 1 CAPSULE ONCE DAILY 30 MINUTES BEFORE BREAKFAST. 90 capsule 2  . pravastatin (PRAVACHOL) 40 MG tablet TAKE 1 TABLET EVERY DAY 90 tablet 3  . promethazine (PHENERGAN) 12.5 MG tablet Take 1 tablet (12.5 mg total) by mouth every 8 (eight) hours as needed for Nausea or Vomiting 30 tablet 1  .  sotalol (BETAPACE) 80 MG tablet Take 80 mg by mouth 2 (two) times daily.  Marland Kitchen spironolactone (ALDACTONE) 50 MG tablet Take 1 tablet by mouth once daily  . telmisartan (MICARDIS) 80 MG tablet Take 80 mg by mouth once daily  . CONTOUR NEXT TEST STRIPS test strip TEST BLOOD SUGAR FOUR TIMES DAILY (Patient not taking: Reported on 02/03/2021) 400 strip 1  . insulin syringe-needle U-100 0.3 mL 30 gauge x 5/16" syringe Use 3 times daily (Patient not taking: Reported on 02/03/2021 ) 300 each 1  . insulin syringe-needle U-100 1/2 mL 31 gauge x 15/64" Syrg Use 1 each 3 (three) times a day (Patient not taking: Reported on 02/03/2021 ) 300 each 1  . lancets Use 4 times daily (Patient not taking: Reported on 02/03/2021 ) 400 each 4  . pen needle, diabetic 31 gauge x 3/16" needle Use as directed 4 times daily. (Patient not taking: Reported on 02/03/2021 ) 400 each 3   No current Epic-ordered facility-administered medications on file.   Allergies: Allergies  Allergen Reactions  .  Other Anaphylaxis, Hives and Itching  Iv dye  Iv dye Confirmed with pt over the phone on 08/01/15 Iv dye  . Iodine And Iodide Containing Products Unknown  . Lodine [Etodolac] Itching  . Percocet [Oxycodone-Acetaminophen] Hives    Body mass index is 39.03 kg/m.  Review of Systems: A comprehensive 14 point ROS was performed, reviewed, and the pertinent orthopaedic findings are documented in the HPI.  Vitals:  02/20/21 0902  BP: 138/76    General Physical Examination:   General:  Well developed, well nourished, no apparent distress, normal affect, normal gait with mild antalgic gait  HEENT: Head normocephalic, atraumatic, PERRL.   Abdomen: Soft, non tender, non distended, Bowel sounds present.  Heart: Examination of the heart reveals regular, rate, and rhythm. There is no murmur noted on ascultation. There is a normal apical pulse.  Lungs: Lungs are clear to auscultation. There is no wheeze, rhonchi, or crackles.  There is normal expansion of bilateral chest walls.   Musculoskeletal Examination:  On exam, no limp. Right hip has 30 degrees internal rotation and 30 degrees external without hip. Left hip internal rotation to 30 degrees, external to 20 degrees with groin and buttock pain.  Radiographs:  AP pelvis and lateral x-rays of the left hip were reviewed by me today from 10/29/2020. These show advanced central hip osteoarthritis with global narrowing of the joint. There is also sacroiliac joint arthritis bilaterally.  Assessment: ICD-10-CM  1. Primary localized osteoarthritis of left hip M16.12   Plan:  1. Risks, benefits, complications of a left total hip arthroplasty have been discussed with the patient. Patient has agreed and consented procedure with Dr. Hessie Knows on 02/24/2021   Electronically signed by Feliberto Gottron, PA at 02/20/2021 9:31 AM EDT  Back to top of Progress Notes  Reviewed  H+P. No changes noted.

## 2021-02-24 NOTE — Op Note (Signed)
02/24/2021  3:26 PM  PATIENT:  Hailey Howard  65 y.o. female  PRE-OPERATIVE DIAGNOSIS:  Primary localized osteoarthritis of left hip M16.12 Left hip pain M25.552  POST-OPERATIVE DIAGNOSIS:  Primary localized osteoarthritis of left hip M16.12 Left hip pain M25.552  PROCEDURE:  Procedure(s): TOTAL HIP ARTHROPLASTY ANTERIOR APPROACH (Left)  SURGEON: Laurene Footman, MD  ASSISTANTS: none  ANESTHESIA:   spinal  EBL:  Total I/O In: 1100 [I.V.:1000; IV Piggyback:100] Out: 300 [Urine:250; Blood:50]  BLOOD ADMINISTERED:none  DRAINS: Incisional wound VAC   LOCAL MEDICATIONS USED:  MARCAINE    and OTHER Exparel  SPECIMEN:  Source of Specimen:  Left femoral head  DISPOSITION OF SPECIMEN:  PATHOLOGY  COUNTS:  YES  TOURNIQUET:  * No tourniquets in log *  IMPLANTS: Medacta Master lock 4 lateralized stem ceramic S 28 mm head, 54 mm Mpact DM cup and liner  DICTATION: .Dragon Dictation the patient was brought to the operating room and after spinal anesthesia was obtained patient was placed on the operative table with the ipsilateral foot into the Medacta attachment, contralateral leg on a well-padded table. C-arm was brought in and preop template x-ray taken. After prepping and draping in usual sterile fashion appropriate patient identification and timeout procedures were completed. Anterior approach to the hip was obtained and centered over the greater trochanter and TFL muscle. The subcutaneous tissue was incised hemostasis being achieved by electrocautery. TFL fascia was incised and the muscle retracted laterally deep retractor placed. The lateral femoral circumflex vessels were identified and ligated. The anterior capsule was exposed and a capsulotomy performed. The neck was identified and a femoral neck cut carried out with a saw. The head was removed without difficulty and showed centrally arthritis and acetabulum showed much more degenerative changes. Reaming was carried out to 54 mm  and a 54 mm cup trial gave appropriate tightness to the acetabular component a 54 DM cup was impacted into position. The leg was then externally rotated and ischiofemoral and pubofemoral releases carried out. The femur was sequentially broached to a size 4, size 4 lateralized with S head trials were placed and the final components chosen. The 4 lateralized Master lock stem was inserted along with a ceramic S 28 mm head and 54 mm liner. The hip was reduced and was stable the wound was thoroughly irrigated with fibrillar placed along the posterior capsule and medial neck. The deep fascia ws closed using a heavy Quill after infiltration of 30 cc of quarter percent Sensorcaine with epinephrine diluted with Exparel throughout the case .3-0 V-loc to close the skin with skin staples.  Incisional wound VAC applied and e patient was sent to recovery in stable condition.   PLAN OF CARE: Admit to inpatient

## 2021-02-24 NOTE — Plan of Care (Signed)

## 2021-02-24 NOTE — Anesthesia Preprocedure Evaluation (Signed)
Anesthesia Evaluation  Patient identified by MRN, date of birth, ID band Patient awake    Reviewed: Allergy & Precautions, NPO status , Patient's Chart, lab work & pertinent test results  History of Anesthesia Complications Negative for: history of anesthetic complications  Airway Mallampati: III       Dental   Pulmonary neg sleep apnea, neg COPD, Not current smoker,    Pulmonary exam normal        Cardiovascular hypertension, Pt. on medications + CAD, + CABG and + Peripheral Vascular Disease  Normal cardiovascular exam+ dysrhythmias Atrial Fibrillation (-) Valvular Problems/Murmurs     Neuro/Psych  Headaches, neg Seizures  Neuromuscular disease negative psych ROS   GI/Hepatic Neg liver ROS, hiatal hernia, GERD  Medicated and Controlled,  Endo/Other  diabetes, Type 2, Oral Hypoglycemic AgentsHypothyroidism   Renal/GU Renal InsufficiencyRenal disease     Musculoskeletal  (+) Arthritis , Osteoarthritis,    Abdominal Normal abdominal exam  (+)   Peds negative pediatric ROS (+)  Hematology  (+) anemia ,   Anesthesia Other Findings Past Medical History: No date: Anemia     Comment:  iron anemia, now getting infusions 08/17/2019: Anemia of chronic kidney failure, stage 3 (moderate) (HCC) No date: Arthritis No date: Bradycardia No date: CKD (chronic kidney disease), stage III (HCC) No date: Coronary artery disease 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     Comment:  LAD during diagnostic PCI at [facility] No date: Diabetes mellitus without complication ([place]) 26/33/3545: DM type 2 with diabetic peripheral neuropathy (HCC) No date: Dysphagia No date: Dysrhythmia No date: Exertional dyspnea No date: GERD (gastroesophageal reflux disease) No date: Headache No date: History of hiatal hernia No date: History of shingles No date: HLD (hyperlipidemia) 05/03/2001: Hx of CABG     Comment:  SVG-OM2, SVG-D2, LIMA-LAD No date: Hx  of thyroid cancer     Comment:  s/p thyroidectomy No date: Hypertension No date: IBS (irritable bowel syndrome) No date: Mesenteric mass No date: Peripheral vascular disease (HCC) No date: Right BBB/left ant fasc block No date: Rotator cuff tendinitis, left No date: Unspecified atrial fibrillation ([place])  Reproductive/Obstetrics                             Anesthesia Physical  Anesthesia Plan  ASA: III  Anesthesia Plan: Spinal   Post-op Pain Management:    Induction: Intravenous  PONV Risk Score and Plan:   Airway Management Planned: Nasal Cannula  Additional Equipment:   Intra-op Plan:   Post-operative Plan:   Informed Consent: I have reviewed the patients History and Physical, chart, labs and discussed the procedure including the risks, benefits and alternatives for the proposed anesthesia with the patient or authorized representative who has indicated his/her understanding and acceptance.       Plan Discussed with:   Anesthesia Plan Comments:         Anesthesia Quick Evaluation

## 2021-02-25 ENCOUNTER — Encounter: Payer: Self-pay | Admitting: Orthopedic Surgery

## 2021-02-25 DIAGNOSIS — M1612 Unilateral primary osteoarthritis, left hip: Secondary | ICD-10-CM | POA: Diagnosis not present

## 2021-02-25 LAB — GLUCOSE, CAPILLARY
Glucose-Capillary: 191 mg/dL — ABNORMAL HIGH (ref 70–99)
Glucose-Capillary: 265 mg/dL — ABNORMAL HIGH (ref 70–99)
Glucose-Capillary: 286 mg/dL — ABNORMAL HIGH (ref 70–99)
Glucose-Capillary: 189 mg/dL — ABNORMAL HIGH (ref 70–99)
Glucose-Capillary: 231 mg/dL — ABNORMAL HIGH (ref 70–99)

## 2021-02-25 LAB — CBC
HCT: 31.1 % — ABNORMAL LOW (ref 36.0–46.0)
Hemoglobin: 10 g/dL — ABNORMAL LOW (ref 12.0–15.0)
MCH: 27.8 pg (ref 26.0–34.0)
MCHC: 32.2 g/dL (ref 30.0–36.0)
MCV: 86.4 fL (ref 80.0–100.0)
Platelets: 250 10*3/uL (ref 150–400)
RBC: 3.6 MIL/uL — ABNORMAL LOW (ref 3.87–5.11)
RDW: 13.6 % (ref 11.5–15.5)
WBC: 13.7 10*3/uL — ABNORMAL HIGH (ref 4.0–10.5)
nRBC: 0 % (ref 0.0–0.2)

## 2021-02-25 LAB — BASIC METABOLIC PANEL
Anion gap: 8 (ref 5–15)
BUN: 28 mg/dL — ABNORMAL HIGH (ref 8–23)
CO2: 21 mmol/L — ABNORMAL LOW (ref 22–32)
Calcium: 8 mg/dL — ABNORMAL LOW (ref 8.9–10.3)
Chloride: 105 mmol/L (ref 98–111)
Creatinine, Ser: 1.29 mg/dL — ABNORMAL HIGH (ref 0.44–1.00)
GFR, Estimated: 45 mL/min — ABNORMAL LOW (ref >60)
Glucose, Bld: 290 mg/dL — ABNORMAL HIGH (ref 70–99)
Potassium: 4.3 mmol/L (ref 3.5–5.1)
Sodium: 134 mmol/L — ABNORMAL LOW (ref 135–145)

## 2021-02-25 NOTE — Plan of Care (Signed)

## 2021-02-25 NOTE — Anesthesia Postprocedure Evaluation (Signed)
Anesthesia Post Note  Patient: KARLEEN SEEBECK  Procedure(s) Performed: TOTAL HIP ARTHROPLASTY ANTERIOR APPROACH (Left Hip)  Patient location during evaluation: Nursing Unit Anesthesia Type: Spinal Level of consciousness: oriented and awake and alert Pain management: pain level controlled Vital Signs Assessment: post-procedure vital signs reviewed and stable Respiratory status: spontaneous breathing and respiratory function stable Cardiovascular status: blood pressure returned to baseline and stable Postop Assessment: no headache, no backache, no apparent nausea or vomiting and patient able to bend at knees Anesthetic complications: no   No complications documented.   Last Vitals:  Vitals:   02/25/21 0409 02/25/21 0730  BP: (!) 155/69 (!) 153/62  Pulse: 61 (!) 57  Resp: 16 15  Temp: 37.2 C 36.9 C  SpO2: 99% 99%    Last Pain:  Vitals:   02/25/21 0937  TempSrc:   PainSc: 5                  Kalub Morillo Lorenza Chick

## 2021-02-25 NOTE — Progress Notes (Signed)
Subjective: 1 Day Post-Op Procedure(s) (LRB): TOTAL HIP ARTHROPLASTY ANTERIOR APPROACH (Left) Patient reports pain as mild.   Patient is well, and has had no acute complaints or problems Denies any CP, SOB, ABD pain. We will continue therapy today.  Plan is to go Home after hospital stay.  Objective: Vital signs in last 24 hours: Temp:  [97 F (36.1 C)-98.9 F (37.2 C)] 98.5 F (36.9 C) (04/06 0730) Pulse Rate:  [53-64] 57 (04/06 0730) Resp:  [13-19] 15 (04/06 0730) BP: (81-169)/(42-70) 153/62 (04/06 0730) SpO2:  [96 %-100 %] 99 % (04/06 0730) Weight:  [111.1 kg] 111.1 kg (04/05 1106)  Intake/Output from previous day: 04/05 0701 - 04/06 0700 In: 2737 [P.O.:837; I.V.:1800; IV Piggyback:100] Out: 1400 [Urine:1350; Blood:50] Intake/Output this shift: No intake/output data recorded.  Recent Labs    02/24/21 1850 02/25/21 0633  HGB 10.5* 10.0*   Recent Labs    02/24/21 1850 02/25/21 0633  WBC 12.3* 13.7*  RBC 3.77* 3.60*  HCT 33.3* 31.1*  PLT 267 250   Recent Labs    02/24/21 1850 02/25/21 0633  NA  --  134*  K  --  4.3  CL  --  105  CO2  --  21*  BUN  --  28*  CREATININE 1.34* 1.29*  GLUCOSE  --  290*  CALCIUM  --  8.0*   No results for input(s): LABPT, INR in the last 72 hours.  EXAM General - Patient is Alert, Appropriate and Oriented Extremity - Neurovascular intact Sensation intact distally Intact pulses distally Dorsiflexion/Plantar flexion intact No cellulitis present Compartment soft Dressing - dressing C/D/I and no drainage, Praveena intact without drainage Motor Function - intact, moving foot and toes well on exam.   Past Medical History:  Diagnosis Date  . Anemia    iron anemia, now getting infusions  . Anemia of chronic kidney failure, stage 3 (moderate) ([place]) 08/17/2019  . Arthritis   . Bradycardia   . CKD (chronic kidney disease), stage III ([place])   . Coronary artery disease   . 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 Diabetes mellitus without complication ([place])   . DM type 2 with diabetic peripheral neuropathy ([place]) 09/19/2015  . Dysphagia   . Dysrhythmia   . Exertional dyspnea   . GERD (gastroesophageal reflux disease)   . Headache   . History of hiatal hernia   . History of shingles   . HLD (hyperlipidemia)   . 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 Hx of thyroid cancer    s/p thyroidectomy  . Hypertension   . IBS (irritable bowel syndrome)   . Mesenteric mass   . Peripheral vascular disease ([place])   . Right BBB/left ant fasc block   . Rotator cuff tendinitis, left   . Unspecified atrial fibrillation (HCC)     Assessment/Plan:   1 Day Post-Op Procedure(s) (LRB): TOTAL HIP ARTHROPLASTY ANTERIOR APPROACH (Left) Active Problems:   S/P hip replacement  Estimated body mass index is 38.37 kg/m as calculated from the following:   Height as of this encounter: 5\' 7"  (1.702 m).   Weight as of this encounter: 111.1 kg. Advance diet Up with therapy  Work on bowel movement Pain well controlled Labs and vital signs are stable Care management to assist with discharge  DVT Prophylaxis - Lovenox, TED hose and SCDs Weight-Bearing as tolerated to left leg   T. Rachelle Hora, PA-C [facility]  Orthopaedics 02/25/2021, 8:02 AM

## 2021-02-25 NOTE — Progress Notes (Signed)
Met with the patient and her husband at the bedside to discuss DC plan and needs She lives with her spouse She needs a 3 in 1 and a RW, arranged for [employer] to bring to the room prior to DC, Set up with [facility] PT No additional needs

## 2021-02-25 NOTE — Progress Notes (Signed)
Physical Therapy Treatment Patient Details Name: Hailey Howard MRN: [id number] DOB: October 09, 1956 Today's Date: 02/25/2021    History of Present Illness Patient is 65 year old female s/p L anterior total hip arthroplasty.    PT Comments    Patient received in bed, reports she just got back into bed, but is more than willing to get up. Reports she is sleepy. Reports 7/10 pain in left LE. Patient is mod independent with bed mobility, transfers with supervision and cues. Ambulated 120 feet with RW, min guard. Distance limited by soreness in hip. Patient performed bed exercises at end of session. She will continue to benefit from skilled PT while here to improve strength, safety and functional independence for safe return home.     Follow Up Recommendations  Home health PT     Equipment Recommendations  Rolling walker with 5" wheels;3in1 (PT)    Recommendations for Other Services       Precautions / Restrictions Precautions Precautions: Fall;Anterior Hip Precaution Booklet Issued: Yes (comment) Restrictions Weight Bearing Restrictions: No Other Position/Activity Restrictions: WBAT    Mobility  Bed Mobility Overal bed mobility: Modified Independent                  Transfers Overall transfer level: Needs assistance Equipment used: Rolling walker (2 wheeled) Transfers: Sit to/from Stand Sit to Stand: Supervision         General transfer comment: Cues for hand/foot placement during transfers, cues for increased safety  Ambulation/Gait Ambulation/Gait assistance: Min guard Gait Distance (Feet): 120 Feet Assistive device: Rolling walker (2 wheeled) Gait Pattern/deviations: Step-to pattern;Decreased step length - right;Decreased step length - left;Decreased weight shift to left Gait velocity: decr   General Gait Details: Patient is steady with ambulation. Reliant on RW for decreased weight transfer to L LE due to pain   Stairs             Wheelchair  Mobility    Modified Rankin (Stroke Patients Only)       Balance Overall balance assessment: Modified Independent Sitting-balance support: Feet supported Sitting balance-Leahy Scale: Good     Standing balance support: Bilateral upper extremity supported;During functional activity Standing balance-Leahy Scale: Good Standing balance comment: reliant on B UE support due to pain                            Cognition Arousal/Alertness: Awake/alert Behavior During Therapy: WFL for tasks assessed/performed Overall Cognitive Status: Within Functional Limits for tasks assessed                                        Exercises Total Joint Exercises Ankle Circles/Pumps: AROM;10 reps;Both Quad Sets: AROM;Both;10 reps Gluteal Sets: AROM;10 reps;Both Heel Slides: AROM;Left;5 reps Hip ABduction/ADduction: AROM;Left;5 reps Straight Leg Raises: AROM;Left;5 reps Long Arc Quad: AROM;Left;5 reps    General Comments        Pertinent Vitals/Pain Pain Assessment: 0-10 Pain Score: 7  Pain Descriptors / Indicators: Aching;Discomfort;Sore Pain Intervention(s): Monitored during session;Patient requesting pain meds-RN notified;Ice applied;Repositioned    Home Living Family/patient expects to be discharged to:: Private residence Living Arrangements: Spouse/significant other;Other relatives Available Help at Discharge: Family;Available PRN/intermittently Type of Home: House Home Access: Ramped entrance   Home Layout: One level Home Equipment: None      Prior Function Level of Independence: Independent  PT Goals (current goals can now be found in the care plan section) Acute Rehab PT Goals Patient Stated Goal: to return home PT Goal Formulation: With patient Time For Goal Achievement: 02/28/21 Potential to Achieve Goals: Good Progress towards PT goals: Progressing toward goals    Frequency    BID      PT Plan Current plan remains  appropriate    Co-evaluation              AM-PAC PT "6 Clicks" Mobility   Outcome Measure  Help needed turning from your back to your side while in a flat bed without using bedrails?: A Little Help needed moving from lying on your back to sitting on the side of a flat bed without using bedrails?: A Little Help needed moving to and from a bed to a chair (including a wheelchair)?: A Little Help needed standing up from a chair using your arms (e.g., wheelchair or bedside chair)?: A Little Help needed to walk in hospital room?: A Little Help needed climbing 3-5 steps with a railing? : A Little 6 Click Score: 18    End of Session Equipment Utilized During Treatment: Gait belt Activity Tolerance: Patient limited by pain Patient left: in bed;with call bell/phone within reach;with bed alarm set;with SCD's reapplied;Other (comment) (Ice applied) Nurse Communication: Mobility status;Patient requests pain meds PT Visit Diagnosis: Other abnormalities of gait and mobility (R26.89);Muscle weakness (generalized) (M62.81);Pain Pain - Right/Left: Left Pain - part of body: Hip     Time: 1315-1341 PT Time Calculation (min) (ACUTE ONLY): 26 min  Charges:  $Gait Training: 8-22 mins $Therapeutic Exercise: 8-22 mins                     Doris Mcgilvery, PT, GCS 02/25/21,1:51 PM

## 2021-02-25 NOTE — Evaluation (Signed)
Occupational Therapy Evaluation Patient Details Name: Hailey Howard MRN: [id number] DOB: [birth year] Today's Date: 02/25/2021    History of Present Illness Patient is 65 year old female s/p L anterior total hip arthroplasty.   Clinical Impression   Hailey Howard was seen for OT evaluation this date, POD#1 from above surgery. Pt was independent in all ADLs prior to surgery. Pt is eager to return to PLOF with less pain and improved safety and independence. Upon arrival pt completing toileting with RN, required SBA + RW for toilet t/f. MOD A for LBD seated EOB - assist for threading over toes/heel. Pt instructed in self care skills, falls prevention strategies, home/routines modifications, DME/AE for LB bathing and dressing tasks, and pain mgmt. Pt would benefit from additional instruction in self care skills and techniques to help maintain precautions with or without assistive devices to support recall and carryover prior to discharge. Recommend HHOT upon discharge.      Follow Up Recommendations  Home health OT    Equipment Recommendations  3 in 1 bedside commode    Recommendations for Other Services       Precautions / Restrictions Precautions Precautions: Fall;Anterior Hip Precaution Booklet Issued: Yes (comment) Restrictions Weight Bearing Restrictions: Yes Other Position/Activity Restrictions: WBAT      Mobility Bed Mobility Overal bed mobility: Modified Independent             General bed mobility comments: use of bedsheet to lift LLE for return to bed    Transfers Overall transfer level: Needs assistance Equipment used: Rolling walker (2 wheeled) Transfers: Sit to/from Stand Sit to Stand: Supervision         General transfer comment: SBA    Balance Overall balance assessment: Needs assistance Sitting-balance support: Feet supported Sitting balance-Leahy Scale: Good     Standing balance support: Bilateral upper extremity supported;During functional  activity Standing balance-Leahy Scale: Good Standing balance comment: reliant on B UE support due to pain                           ADL either performed or assessed with clinical judgement   ADL Overall ADL's : Needs assistance/impaired                                       General ADL Comments: SBA + RW for toilet t/f. MOD A for LBD seated EOB - assist for threading over toes/heel                  Pertinent Vitals/Pain Pain Assessment: 0-10 Pain Score: 8  Pain Location: L hip Pain Descriptors / Indicators: Aching;Discomfort;Sore Pain Intervention(s): Limited activity within patient's tolerance;Repositioned;Patient requesting pain meds-RN notified;Ice applied     Hand Dominance     Extremity/Trunk Assessment Upper Extremity Assessment Upper Extremity Assessment: Overall WFL for tasks assessed   Lower Extremity Assessment Lower Extremity Assessment: Generalized weakness       Communication Communication Communication: No difficulties   Cognition Arousal/Alertness: Awake/alert Behavior During Therapy: WFL for tasks assessed/performed Overall Cognitive Status: Within Functional Limits for tasks assessed                                     General Comments       Exercises Exercises: Other exercises Total Joint Exercises Ankle  Circles/Pumps: AROM;10 reps;Both Quad Sets: AROM;Both;10 reps Gluteal Sets: AROM;10 reps;Both Heel Slides: AROM;Left;5 reps Hip ABduction/ADduction: AROM;Left;5 reps Straight Leg Raises: AROM;Left;5 reps Other Exercises Other Exercises: Pt educated re: OT role, DME recs, d/c recs, falls prevention, ECS, adapted dressing techniques Other Exercises: LBD, toilet t/f, sit>sup, sitting/standing balance/tolerance   Shoulder Instructions      Home Living Family/patient expects to be discharged to:: Private residence Living Arrangements: Spouse/significant other;Other relatives Available Help at  Discharge: Family;Available PRN/intermittently Type of Home: House Home Access: Ramped entrance     Home Layout: One level     Bathroom Shower/Tub: Teacher, early years/pre: Standard Bathroom Accessibility: Yes How Accessible: Accessible via walker Home Equipment: None          Prior Functioning/Environment Level of Independence: Independent                 OT Problem List: Decreased strength;Decreased range of motion;Decreased activity tolerance;Impaired balance (sitting and/or standing);Decreased safety awareness;Decreased knowledge of use of DME or AE      OT Treatment/Interventions: Self-care/ADL training;Therapeutic exercise;Energy conservation;DME and/or AE instruction;Therapeutic activities;Patient/family education;Balance training    OT Goals(Current goals can be found in the care plan section) Acute Rehab OT Goals Patient Stated Goal: to return home OT Goal Formulation: With patient Time For Goal Achievement: 03/11/21 Potential to Achieve Goals: Good ADL Goals Pt Will Perform Grooming: with modified independence;standing (c LRAD PRN) Pt Will Perform Lower Body Dressing: with modified independence;sit to/from stand (c LRAD PRN) Pt Will Transfer to Toilet: with modified independence;ambulating;regular height toilet (c LRAD PRN)  OT Frequency: Min 1X/week    AM-PAC OT "6 Clicks" Daily Activity     Outcome Measure Help from another person eating meals?: None Help from another person taking care of personal grooming?: A Little Help from another person toileting, which includes using toliet, bedpan, or urinal?: A Little Help from another person bathing (including washing, rinsing, drying)?: A Little Help from another person to put on and taking off regular upper body clothing?: None Help from another person to put on and taking off regular lower body clothing?: A Lot 6 Click Score: 19   End of Session Equipment Utilized During Treatment: Rolling  walker Nurse Communication: Patient requests pain meds  Activity Tolerance: Patient tolerated treatment well Patient left: in bed;with call bell/phone within reach;with bed alarm set  OT Visit Diagnosis: Other abnormalities of gait and mobility (R26.89);Muscle weakness (generalized) (M62.81)                Time: 5465-0354 OT Time Calculation (min): 8 min Charges:  OT General Charges $OT Visit: 1 Visit OT Evaluation $OT Eval Low Complexity: 1 Low  Dessie Coma, M.S. OTR/L  02/25/21, 3:36 PM  ascom [phone]

## 2021-02-25 NOTE — Evaluation (Signed)
Physical Therapy Evaluation Patient Details Name: Hailey Howard MRN: [id number] DOB: Aug 14, 1956 Today's Date: 02/25/2021   History of Present Illness  Patient is 65 year old female s/p L anterior total hip arthroplasty.  Clinical Impression  Patient received in bed, awake, alert, ready for PT. She reports mod pain. Patient is mod independent with supine to sit and sit to stand with min guard. She ambulated 30 feet with RW and min guard/ supervision with cues for sequencing and safe use of AD. Patient will continue to benefit from skilled PT while here to improve functional independence, strength and safety.      Follow Up Recommendations Home health PT    Equipment Recommendations  Rolling walker with 5" wheels;3in1 (PT)    Recommendations for Other Services       Precautions / Restrictions Precautions Precautions: Fall;Anterior Hip Precaution Booklet Issued: No Restrictions Weight Bearing Restrictions: No Other Position/Activity Restrictions: WBAT      Mobility  Bed Mobility Overal bed mobility: Modified Independent                  Transfers Overall transfer level: Needs assistance Equipment used: Rolling walker (2 wheeled) Transfers: Sit to/from Stand Sit to Stand: Min guard         General transfer comment: Cues for hand/foot placement during transfers, cues for increased safety  Ambulation/Gait Ambulation/Gait assistance: Min guard;Supervision Gait Distance (Feet): 30 Feet Assistive device: Rolling walker (2 wheeled) Gait Pattern/deviations: Step-to pattern;Decreased weight shift to left;Trunk flexed Gait velocity: decr   General Gait Details: Patient is steady with ambulation. Reliant on RW for decreased weight transfer to L LE due to pain  Stairs            Wheelchair Mobility    Modified Rankin (Stroke Patients Only)       Balance Overall balance assessment: Needs assistance Sitting-balance support: Feet supported Sitting  balance-Leahy Scale: Good     Standing balance support: Bilateral upper extremity supported;During functional activity Standing balance-Leahy Scale: Good Standing balance comment: reliant on B UE support due to pain                             Pertinent Vitals/Pain Pain Assessment: 0-10 Pain Score: 7  Pain Descriptors / Indicators: Aching;Discomfort;Sore Pain Intervention(s): Limited activity within patient's tolerance;Monitored during session;Premedicated before session;Repositioned;Ice applied    Home Living Family/patient expects to be discharged to:: Private residence Living Arrangements: Spouse/significant other;Other relatives Available Help at Discharge: Family;Available PRN/intermittently Type of Home: House Home Access: Ramped entrance     Home Layout: One level Home Equipment: None      Prior Function Level of Independence: Independent               Hand Dominance        Extremity/Trunk Assessment   Upper Extremity Assessment Upper Extremity Assessment: Defer to OT evaluation    Lower Extremity Assessment Lower Extremity Assessment: Generalized weakness;LLE deficits/detail LLE Sensation: WNL    Cervical / Trunk Assessment Cervical / Trunk Assessment: Normal  Communication   Communication: No difficulties  Cognition Arousal/Alertness: Awake/alert Behavior During Therapy: WFL for tasks assessed/performed Overall Cognitive Status: Within Functional Limits for tasks assessed                                        General Comments  Exercises Total Joint Exercises Ankle Circles/Pumps: AROM;10 reps;Both Quad Sets: AROM;Both;10 reps Gluteal Sets: AROM;Both;10 reps Long Arc Quad: AROM;Left;5 reps   Assessment/Plan    PT Assessment Patient needs continued PT services  PT Problem List Decreased strength;Decreased mobility;Decreased activity tolerance;Pain;Decreased knowledge of use of DME;Decreased knowledge of  precautions;Decreased safety awareness       PT Treatment Interventions DME instruction;Therapeutic exercise;Gait training;Balance training;Stair training;Neuromuscular re-education;Functional mobility training;Therapeutic activities;Patient/family education    PT Goals (Current goals can be found in the Care Plan section)  Acute Rehab PT Goals Patient Stated Goal: to return home PT Goal Formulation: With patient Time For Goal Achievement: 02/28/21 Potential to Achieve Goals: Good    Frequency BID   Barriers to discharge Decreased caregiver support      Co-evaluation               AM-PAC PT "6 Clicks" Mobility  Outcome Measure Help needed turning from your back to your side while in a flat bed without using bedrails?: A Little Help needed moving from lying on your back to sitting on the side of a flat bed without using bedrails?: A Little Help needed moving to and from a bed to a chair (including a wheelchair)?: A Little Help needed standing up from a chair using your arms (e.g., wheelchair or bedside chair)?: A Little Help needed to walk in hospital room?: A Little Help needed climbing 3-5 steps with a railing? : A Little 6 Click Score: 18    End of Session Equipment Utilized During Treatment: Gait belt Activity Tolerance: Patient limited by pain Patient left: in chair;with call bell/phone within reach;with chair alarm set;with SCD's reapplied Nurse Communication: Mobility status PT Visit Diagnosis: Other abnormalities of gait and mobility (R26.89);Muscle weakness (generalized) (M62.81);Pain Pain - Right/Left: Left Pain - part of body: Hip    Time: 0930-0950 PT Time Calculation (min) (ACUTE ONLY): 20 min   Charges:   PT Evaluation $PT Eval Moderate Complexity: 1 Mod PT Treatments $Gait Training: 8-22 mins        Imara Standiford, PT, GCS 02/25/21,10:49 AM

## 2021-02-26 DIAGNOSIS — M1612 Unilateral primary osteoarthritis, left hip: Secondary | ICD-10-CM | POA: Diagnosis not present

## 2021-02-26 LAB — CBC
HCT: 31.4 % — ABNORMAL LOW (ref 36.0–46.0)
Hemoglobin: 9.9 g/dL — ABNORMAL LOW (ref 12.0–15.0)
MCH: 27.8 pg (ref 26.0–34.0)
MCHC: 31.5 g/dL (ref 30.0–36.0)
MCV: 88.2 fL (ref 80.0–100.0)
Platelets: 238 10*3/uL (ref 150–400)
RBC: 3.56 MIL/uL — ABNORMAL LOW (ref 3.87–5.11)
RDW: 13.8 % (ref 11.5–15.5)
WBC: 13.3 10*3/uL — ABNORMAL HIGH (ref 4.0–10.5)
nRBC: 0 % (ref 0.0–0.2)

## 2021-02-26 LAB — GLUCOSE, CAPILLARY
Glucose-Capillary: 174 mg/dL — ABNORMAL HIGH (ref 70–99)
Glucose-Capillary: 180 mg/dL — ABNORMAL HIGH (ref 70–99)

## 2021-02-26 LAB — SURGICAL PATHOLOGY

## 2021-02-26 MED ORDER — HYDROCODONE-ACETAMINOPHEN 7.5-325 MG PO TABS: 1.0000 | ORAL_TABLET | ORAL | 0 refills | Status: DC | PRN

## 2021-02-26 MED ORDER — METHOCARBAMOL 500 MG PO TABS: 500.0000 mg | ORAL_TABLET | Freq: Four times a day (QID) | ORAL | 0 refills | Status: DC | PRN

## 2021-02-26 MED ORDER — BISACODYL 10 MG RE SUPP
10.0000 mg | Freq: Once | RECTAL | Status: AC
  Administered 2021-02-26: 10 mg via RECTAL
  Filled 2021-02-26: qty 1

## 2021-02-26 MED ORDER — ENOXAPARIN SODIUM 40 MG/0.4ML ~~LOC~~ SOLN: 40.0000 mg | SUBCUTANEOUS | 0 refills | Status: DC

## 2021-02-26 MED ORDER — MAGNESIUM HYDROXIDE 400 MG/5ML PO SUSP
30.0000 mL | Freq: Once | ORAL | Status: AC
  Administered 2021-02-26: 30 mL via ORAL
  Filled 2021-02-26: qty 30

## 2021-02-26 MED ORDER — TRAMADOL HCL 50 MG PO TABS: 50.0000 mg | ORAL_TABLET | Freq: Four times a day (QID) | ORAL | 0 refills | Status: DC | PRN

## 2021-02-26 MED ORDER — DOCUSATE SODIUM 100 MG PO CAPS: 100.0000 mg | ORAL_CAPSULE | Freq: Two times a day (BID) | ORAL | 0 refills | Status: DC

## 2021-02-26 NOTE — Progress Notes (Signed)
Subjective: 2 Days Post-Op Procedure(s) (LRB): TOTAL HIP ARTHROPLASTY ANTERIOR APPROACH (Left) Patient reports pain as 4 on 0-10 scale.   Patient is well, and has had no acute complaints or problems Denies any CP, SOB, ABD pain. We will continue therapy today.  Plan is to go Home after hospital stay.  Objective: Vital signs in last 24 hours: Temp:  [98 F (36.7 C)-98.5 F (36.9 C)] 98.1 F (36.7 C) (04/07 0437) Pulse Rate:  [56-62] 62 (04/07 0437) Resp:  [15-18] 18 (04/07 0437) BP: (134-155)/(55-67) 134/61 (04/07 0437) SpO2:  [98 %-99 %] 98 % (04/07 0437)  Intake/Output from previous day: 04/06 0701 - 04/07 0700 In: 240 [P.O.:240] Out: 0  Intake/Output this shift: No intake/output data recorded.  Recent Labs    02/24/21 1850 02/25/21 0633 02/26/21 0447  HGB 10.5* 10.0* 9.9*   Recent Labs    02/25/21 0633 02/26/21 0447  WBC 13.7* 13.3*  RBC 3.60* 3.56*  HCT 31.1* 31.4*  PLT 250 238   Recent Labs    02/24/21 1850 02/25/21 0633  NA  --  134*  K  --  4.3  CL  --  105  CO2  --  21*  BUN  --  28*  CREATININE 1.34* 1.29*  GLUCOSE  --  290*  CALCIUM  --  8.0*   No results for input(s): LABPT, INR in the last 72 hours.  EXAM General - Patient is Alert, Appropriate and Oriented Extremity - Neurovascular intact Sensation intact distally Intact pulses distally Dorsiflexion/Plantar flexion intact No cellulitis present Compartment soft Dressing - dressing C/D/I and no drainage, Praveena intact without drainage Motor Function - intact, moving foot and toes well on exam.   Past Medical History:  Diagnosis Date  . Anemia    iron anemia, now getting infusions  . Anemia of chronic kidney failure, stage 3 (moderate) ([place]) 08/17/2019  . Arthritis   . Bradycardia   . CKD (chronic kidney disease), stage III ([place])   . Coronary artery disease   . 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 Diabetes mellitus without  complication ([place])   . DM type 2 with diabetic peripheral neuropathy ([place]) 09/19/2015  . Dysphagia   . Dysrhythmia   . Exertional dyspnea   . GERD (gastroesophageal reflux disease)   . Headache   . History of hiatal hernia   . History of shingles   . HLD (hyperlipidemia)   . 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 Hx of thyroid cancer    s/p thyroidectomy  . Hypertension   . IBS (irritable bowel syndrome)   . Mesenteric mass   . Peripheral vascular disease ([place])   . Right BBB/left ant fasc block   . Rotator cuff tendinitis, left   . Unspecified atrial fibrillation ([facility])     Assessment/Plan:   2 Days Post-Op Procedure(s) (LRB): TOTAL HIP ARTHROPLASTY ANTERIOR APPROACH (Left) Active Problems:   S/P hip replacement  Estimated body mass index is 38.37 kg/m as calculated from the following:   Height as of this encounter: 5\' 7"  (1.702 m).   Weight as of this encounter: 111.1 kg. Advance diet Up with therapy  Work on bowel movement Pain well controlled Labs and vital signs are stable Care management to assist with discharge to home with HHPT,possibly today  DVT Prophylaxis - Lovenox, TED hose and SCDs Weight-Bearing as tolerated to left leg   T. Rachelle Hora, PA-C [place]  02/26/2021, 7:35 AM

## 2021-02-26 NOTE — Discharge Instructions (Signed)

## 2021-02-26 NOTE — Progress Notes (Signed)
Physical Therapy Treatment Patient Details Name: Hailey Howard MRN: [id number] DOB: 1956/08/20 Today's Date: 02/26/2021    History of Present Illness Patient is 65 year old female s/p L anterior total hip arthroplasty.    PT Comments    Pt was long sitting in bed upon arriving. She is A and O x 4 and eager for DC to home. Agreeable to PT session. Was able to exit bed, stand, and ambulate without RW without LOB or safety concern. Once returned to room, pt requested to have BM. RN staff aware pt in BR at conclusion of session. Pt is cleared from PT standpoint for safe DC home with [facility] PT to follow. Pt has all equipment needs met.    Follow Up Recommendations  Home health PT     Equipment Recommendations  None recommended by PT (pt has recieved personal equipment)       Precautions / Restrictions Precautions Precautions: Fall;Anterior Hip Precaution Booklet Issued: Yes (comment) Restrictions Weight Bearing Restrictions: Yes LLE Weight Bearing: Weight bearing as tolerated    Mobility  Bed Mobility Overal bed mobility: Modified Independent             General bed mobility comments: no physical assistance required to exit bed.    Transfers Overall transfer level: Needs assistance Equipment used: Rolling walker (2 wheeled) Transfers: Sit to/from Stand Sit to Stand: Supervision         General transfer comment: no physical assistance ot vcs to stand from lowest bed height  Ambulation/Gait Ambulation/Gait assistance: Min guard;Supervision Gait Distance (Feet): 160 Feet Assistive device: Rolling walker (2 wheeled) Gait Pattern/deviations: Step-to pattern;Decreased step length - right;Decreased step length - left;Decreased weight shift to left Gait velocity: decr   General Gait Details: pt was easily able to ambulate lap around RN station. NO LOB or safety concerns.   Stairs Stairs:  (pt has ramp entry to home)          Balance Overall balance assessment:  Needs assistance Sitting-balance support: Feet supported Sitting balance-Leahy Scale: Good     Standing balance support: Bilateral upper extremity supported;During functional activity Standing balance-Leahy Scale: Good         Cognition Arousal/Alertness: Awake/alert Behavior During Therapy: WFL for tasks assessed/performed Overall Cognitive Status: Within Functional Limits for tasks assessed      General Comments: Pt is A and O x 4. Cooperative and pleasant throughout.         General Comments General comments (skin integrity, edema, etc.): issued HEP handout. Pt feels safe and confident with DC home today after having BM      Pertinent Vitals/Pain Pain Assessment: 0-10 Pain Score: 5  Pain Location: L hip Pain Descriptors / Indicators: Aching;Discomfort;Sore Pain Intervention(s): Limited activity within patient's tolerance;Monitored during session;Premedicated before session;Repositioned;Ice applied           PT Goals (current goals can now be found in the care plan section) Acute Rehab PT Goals Patient Stated Goal: to return home Progress towards PT goals: Progressing toward goals    Frequency    BID      PT Plan Current plan remains appropriate       AM-PAC PT "6 Clicks" Mobility   Outcome Measure  Help needed turning from your back to your side while in a flat bed without using bedrails?: A Little Help needed moving from lying on your back to sitting on the side of a flat bed without using bedrails?: A Little Help needed moving to and from  a bed to a chair (including a wheelchair)?: A Little Help needed standing up from a chair using your arms (e.g., wheelchair or bedside chair)?: A Little Help needed to walk in hospital room?: A Little Help needed climbing 3-5 steps with a railing? : A Little 6 Click Score: 18    End of Session Equipment Utilized During Treatment: Gait belt Activity Tolerance: Patient tolerated treatment well Patient left: Other  (comment) (in BM having BM. RN staff aware) Nurse Communication: Mobility status PT Visit Diagnosis: Other abnormalities of gait and mobility (R26.89);Muscle weakness (generalized) (M62.81);Pain Pain - Right/Left: Left Pain - part of body: Hip     Time: 1610-9604 PT Time Calculation (min) (ACUTE ONLY): 11 min  Charges:  $Gait Training: 8-22 mins                     Julaine Fusi PTA 02/26/21, 10:08 AM

## 2021-02-26 NOTE — Progress Notes (Signed)
   02/26/21 1020  Clinical Encounter Type  Visited With Patient and family together  Visit Type Initial  Referral From Chaplain  Consult/Referral To [facility] visited room 1A-153A Pt, Mrs. Hailey Howard. Pt stated she had a hip replacement and she is recovery fine. Pt husband was by her bedside. I provided reflective listening and a ministry of presence and wished her a speedy recovery.

## 2021-02-26 NOTE — Discharge Summary (Signed)
Physician Discharge Summary  Patient ID: Hailey Howard MRN: [id number] DOB/AGE: June 21, 1956 65 y.o.  Admit date: 02/24/2021 Discharge date: 02/26/2021  Admission Diagnoses:  S/P hip replacement [Z96.649]   Discharge Diagnoses: Patient Active Problem List   Diagnosis Date Noted  . S/P hip replacement 02/24/2021  . Thyroid cancer ([place]) 03/28/2020  . S/P total thyroidectomy 03/14/2020  . Multinodular goiter 03/04/2020  . Anemia of chronic kidney failure, stage 3 (moderate) ([place]) 08/17/2019  . Coronary artery disease 08/02/2016  . Hyperlipidemia 08/02/2016  . Hypertension 08/02/2016  . Tachycardia 08/02/2016  . Mesenteric mass 08/02/2016  . Bursitis of left shoulder 05/07/2016  . DM type 2 with diabetic peripheral neuropathy ([place]) 09/19/2015  . Edema 06/16/2015  . Atrial fibrillation ([place]) 05/01/2014    Past Medical History:  Diagnosis Date  . Anemia    iron anemia, now getting infusions  . Anemia of chronic kidney failure, stage 3 (moderate) ([place]) 08/17/2019  . Arthritis   . Bradycardia   . CKD (chronic kidney disease), stage III ([place])   . Coronary artery disease   . 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 Diabetes mellitus without complication ([place])   . DM type 2 with diabetic peripheral neuropathy ([place]) 09/19/2015  . Dysphagia   . Dysrhythmia   . Exertional dyspnea   . GERD (gastroesophageal reflux disease)   . Headache   . History of hiatal hernia   . History of shingles   . HLD (hyperlipidemia)   . 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 Hx of thyroid cancer    s/p thyroidectomy  . Hypertension   . IBS (irritable bowel syndrome)   . Mesenteric mass   . Peripheral vascular disease ([place])   . Right BBB/left ant fasc block   . Rotator cuff tendinitis, left   . Unspecified atrial fibrillation ([place])      Transfusion: none   Consultants (if any):   Discharged Condition: Improved  Hospital Course: CHINARA HERTZBERG is an 65 y.o. female who was admitted 02/24/2021 with a diagnosis of left hip osteoathritis and went to the operating room on 02/24/2021 and underwent the above named procedures.    Surgeries: Procedure(s): TOTAL HIP ARTHROPLASTY ANTERIOR APPROACH on 02/24/2021 Patient tolerated the surgery well. Taken to PACU where she was stabilized and then transferred to the orthopedic floor.  Started on Lovenox 40 mg q 24 hrs. Foot pumps applied bilaterally at 80 mm. Heels elevated on bed with rolled towels. No evidence of DVT. Negative [place]. Physical therapy started on day #1 for gait training and transfer. OT started day #1 for ADL and assisted devices.  Patient's foley was d/c on day #1. Patient's IV and hemovac was d/c on day #2.  On post op day #2 patient was stable and ready for discharge to home with HHPT.  Implants: Medacta Master lock 4 lateralized stem ceramic S 28 mm head, 54 mm Mpact DM cup and liner  She was given perioperative antibiotics:  Anti-infectives (From admission, onward)   Start     Dose/Rate Route Frequency Ordered Stop   02/24/21 1930  ceFAZolin (ANCEF) IVPB 2g/100 mL premix        2 g 200 mL/hr over 30 Minutes Intravenous Every 6 hours 02/24/21 1707 02/25/21 0139   02/24/21 1057  ceFAZolin (ANCEF) 2-4 GM/100ML-% IVPB       Note to Pharmacy: Lyman Bishop   : cabinet override  02/24/21 1057 02/24/21 1330   02/24/21 0600  ceFAZolin (ANCEF) IVPB 2g/100 mL premix        2 g 200 mL/hr over 30 Minutes Intravenous On call to O.R. 02/24/21 0225 02/24/21 1330    .  She was given sequential compression devices, early ambulation, and Lovenox TEDs for DVT prophylaxis.  She benefited maximally from the hospital stay and there were no complications.    Recent vital signs:  Vitals:   02/26/21 0437 02/26/21 0742  BP: 134/61 (!) 147/62  Pulse: 62 60  Resp: 18 16  Temp: 98.1 F (36.7 C) 99 F (37.2 C)  SpO2: 98%     Recent laboratory studies:  Lab Results   Component Value Date   HGB 9.9 (L) 02/26/2021   HGB 10.0 (L) 02/25/2021   HGB 10.5 (L) 02/24/2021   Lab Results  Component Value Date   WBC 13.3 (H) 02/26/2021   PLT 238 02/26/2021   Lab Results  Component Value Date   INR 1.0 03/10/2015   Lab Results  Component Value Date   NA 134 (L) 02/25/2021   K 4.3 02/25/2021   CL 105 02/25/2021   CO2 21 (L) 02/25/2021   BUN 28 (H) 02/25/2021   CREATININE 1.29 (H) 02/25/2021   GLUCOSE 290 (H) 02/25/2021    Discharge Medications:   Allergies as of 02/26/2021      Reactions   Other Anaphylaxis, Hives, Itching   Iv dye  Confirmed with pt over the phone on 08/01/15   Iodinated Diagnostic Agents Hives   Oxycodone-acetaminophen Hives   Etodolac Itching      Medication List    STOP taking these medications   aspirin EC 81 MG tablet     TAKE these medications   amLODipine 5 MG tablet Commonly known as: NORVASC Take 5 mg by mouth daily.   clotrimazole 1 % cream Commonly known as: LOTRIMIN Apply 1 application topically 2 (two) times daily as needed (irritation).   Contour Next Test test strip Generic drug: glucose blood   dicyclomine 20 MG tablet Commonly known as: BENTYL Take 20 mg by mouth 2 (two) times daily as needed for spasms.   docusate sodium 100 MG capsule Commonly known as: COLACE Take 1 capsule (100 mg total) by mouth 2 (two) times daily.   enoxaparin 40 MG/0.4ML injection Commonly known as: LOVENOX Inject 0.4 mLs (40 mg total) into the skin daily for 14 days. Start taking on: February 27, 2021   Fiasp 100 UNIT/ML Soln Generic drug: Insulin Aspart (w/Niacinamide) Inject 5 Units into the skin in the morning, at noon, and at bedtime.   fluticasone 50 MCG/ACT nasal spray Commonly known as: FLONASE Place 2 sprays into both nostrils daily as needed for allergies.   furosemide 40 MG tablet Commonly known as: LASIX Take 40 mg by mouth daily.   gabapentin 300 MG capsule Commonly known as: NEURONTIN Take 300 mg  by mouth daily.   HYDROcodone-acetaminophen 7.5-325 MG tablet Commonly known as: NORCO Take 1-2 tablets by mouth every 4 (four) hours as needed for severe pain (pain score 7-10).   Lantus SoloStar 100 UNIT/ML Solostar Pen Generic drug: insulin glargine Inject 35 Units into the skin at bedtime.   levothyroxine 137 MCG tablet Commonly known as: SYNTHROID Take 137 mcg by mouth daily before breakfast.   Magnesium 250 MG Tabs Take 250 mg by mouth daily.   metFORMIN 500 MG tablet Commonly known as: GLUCOPHAGE Take 500-1,000 mg by mouth See admin instructions. Take  1 tablet (500 mg) by mouth in the morning & take 2 tablets (1000 mg) by mouth at night.   methocarbamol 500 MG tablet Commonly known as: ROBAXIN Take 1 tablet (500 mg total) by mouth every 6 (six) hours as needed for muscle spasms.   multivitamin with minerals Tabs tablet Take 1 tablet by mouth daily.   Muscle Rub 10-15 % Crea Apply 1 application topically 4 (four) times daily as needed for muscle pain.   omeprazole 40 MG capsule Commonly known as: PRILOSEC Take 40 mg by mouth daily.   pravastatin 40 MG tablet Commonly known as: PRAVACHOL Take 40 mg by mouth daily.   sotalol 80 MG tablet Commonly known as: BETAPACE Take 80 mg by mouth 2 (two) times daily.   spironolactone 50 MG tablet Commonly known as: ALDACTONE Take 50 mg by mouth daily.   telmisartan 80 MG tablet Commonly known as: MICARDIS Take 80 mg by mouth daily.   traMADol 50 MG tablet Commonly known as: ULTRAM Take 1 tablet (50 mg total) by mouth every 6 (six) hours as needed.   Vitron-C 65-125 MG Tabs Generic drug: Iron-Vitamin C Take 1 tablet by mouth daily.            Durable Medical Equipment  (From admission, onward)         Start     Ordered   02/24/21 1708  DME Walker rolling  Once       Question Answer Comment  Walker: With [place] Wheels   Patient needs a walker to treat with the following condition S/P hip replacement       02/24/21 1707   02/24/21 1708  DME 3 n 1  Once        02/24/21 1707   02/24/21 1708  DME Bedside commode  Once       Question:  Patient needs a bedside commode to treat with the following condition  Answer:  S/P hip replacement   02/24/21 1707          Diagnostic Studies: DG HIP OPERATIVE UNILAT W OR W/O PELVIS LEFT  Result Date: 02/24/2021 CLINICAL DATA:  Left hip arthroplasty EXAM: OPERATIVE left HIP (WITH PELVIS IF PERFORMED) 5 VIEWS TECHNIQUE: Fluoroscopic spot image(s) were submitted for interpretation post-operatively. Fluoroscopy time: 20 seconds Dose: 1 mGy COMPARISON:  None. FINDINGS: Sequential images obtained via portable C-arm radiography in the operating room show placement of a left total hip arthroplasty device. The visualized portions of the hardware components are in anatomic alignment. No periprosthetic fracture noted. Note the distal tip of the femoral component is excluded from the field of view on all images. IMPRESSION: 1. Status post left total hip arthroplasty. Electronically Signed   By: Kerby Moors M.D.   On: 02/24/2021 16:31   DG HIP UNILAT W OR W/O PELVIS 2-3 VIEWS LEFT  Result Date: 02/24/2021 CLINICAL DATA:  Left hip arthroplasty EXAM: OPERATIVE left HIP (WITH PELVIS IF PERFORMED) 5 VIEWS TECHNIQUE: Fluoroscopic spot image(s) were submitted for interpretation post-operatively. Fluoroscopy time: 20 seconds Dose: 1 mGy COMPARISON:  None. FINDINGS: Sequential images obtained via portable C-arm radiography in the operating room show placement of a left total hip arthroplasty device. The visualized portions of the hardware components are in anatomic alignment. No periprosthetic fracture noted. Note the distal tip of the femoral component is excluded from the field of view on all images. IMPRESSION: 1. Status post left total hip arthroplasty. Electronically Signed   By: Queen Slough.D.  On: 02/24/2021 16:31    Disposition:      Follow-up Information     Duanne Guess, PA-C Follow up in 2 week(s).   Specialties: Orthopedic Surgery, Emergency Medicine Contact information: [place] [address] [phone]                Signed: Feliberto Gottron 02/26/2021, 11:01 AM

## 2021-05-29 ENCOUNTER — Inpatient Hospital Stay: Payer: Medicare HMO | Admitting: Internal Medicine

## 2021-05-29 ENCOUNTER — Inpatient Hospital Stay: Payer: Medicare HMO | Attending: Internal Medicine

## 2021-05-29 ENCOUNTER — Other Ambulatory Visit: Payer: Self-pay

## 2021-05-29 ENCOUNTER — Encounter: Payer: Self-pay | Admitting: Internal Medicine

## 2021-05-29 DIAGNOSIS — Z803 Family history of malignant neoplasm of breast: Secondary | ICD-10-CM | POA: Insufficient documentation

## 2021-05-29 DIAGNOSIS — I10 Essential (primary) hypertension: Secondary | ICD-10-CM | POA: Diagnosis not present

## 2021-05-29 DIAGNOSIS — N183 Chronic kidney disease, stage 3 unspecified: Secondary | ICD-10-CM

## 2021-05-29 DIAGNOSIS — Z79899 Other long term (current) drug therapy: Secondary | ICD-10-CM | POA: Insufficient documentation

## 2021-05-29 DIAGNOSIS — D631 Anemia in chronic kidney disease: Secondary | ICD-10-CM

## 2021-05-29 DIAGNOSIS — Z85850 Personal history of malignant neoplasm of thyroid: Secondary | ICD-10-CM | POA: Insufficient documentation

## 2021-05-29 DIAGNOSIS — E1151 Type 2 diabetes mellitus with diabetic peripheral angiopathy without gangrene: Secondary | ICD-10-CM | POA: Insufficient documentation

## 2021-05-29 DIAGNOSIS — K589 Irritable bowel syndrome without diarrhea: Secondary | ICD-10-CM | POA: Insufficient documentation

## 2021-05-29 DIAGNOSIS — Z794 Long term (current) use of insulin: Secondary | ICD-10-CM | POA: Diagnosis not present

## 2021-05-29 LAB — CBC WITH DIFFERENTIAL/PLATELET
Abs Immature Granulocytes: 0.02 10*3/uL (ref 0.00–0.07)
Basophils Absolute: 0 10*3/uL (ref 0.0–0.1)
Basophils Relative: 1 %
Eosinophils Absolute: 0.2 10*3/uL (ref 0.0–0.5)
Eosinophils Relative: 3 %
HCT: 34.9 % — ABNORMAL LOW (ref 36.0–46.0)
Hemoglobin: 10.9 g/dL — ABNORMAL LOW (ref 12.0–15.0)
Immature Granulocytes: 0 %
Lymphocytes Relative: 28 %
Lymphs Abs: 1.8 10*3/uL (ref 0.7–4.0)
MCH: 27.7 pg (ref 26.0–34.0)
MCHC: 31.2 g/dL (ref 30.0–36.0)
MCV: 88.6 fL (ref 80.0–100.0)
Monocytes Absolute: 0.4 10*3/uL (ref 0.1–1.0)
Monocytes Relative: 6 %
Neutro Abs: 3.9 10*3/uL (ref 1.7–7.7)
Neutrophils Relative %: 62 %
Platelets: 267 10*3/uL (ref 150–400)
RBC: 3.94 MIL/uL (ref 3.87–5.11)
RDW: 14 % (ref 11.5–15.5)
WBC: 6.3 10*3/uL (ref 4.0–10.5)
nRBC: 0 % (ref 0.0–0.2)

## 2021-05-29 LAB — FERRITIN: Ferritin: 82 ng/mL (ref 11–307)

## 2021-05-29 LAB — COMPREHENSIVE METABOLIC PANEL
ALT: 12 U/L (ref 0–44)
AST: 19 U/L (ref 15–41)
Albumin: 4 g/dL (ref 3.5–5.0)
Alkaline Phosphatase: 67 U/L (ref 38–126)
Anion gap: 8 (ref 5–15)
BUN: 38 mg/dL — ABNORMAL HIGH (ref 8–23)
CO2: 28 mmol/L (ref 22–32)
Calcium: 8.9 mg/dL (ref 8.9–10.3)
Chloride: 106 mmol/L (ref 98–111)
Creatinine, Ser: 1.47 mg/dL — ABNORMAL HIGH (ref 0.44–1.00)
GFR, Estimated: 39 mL/min — ABNORMAL LOW (ref >60)
Glucose, Bld: 133 mg/dL — ABNORMAL HIGH (ref 70–99)
Potassium: 4.5 mmol/L (ref 3.5–5.1)
Sodium: 142 mmol/L (ref 135–145)
Total Bilirubin: 0.7 mg/dL (ref 0.3–1.2)
Total Protein: 7.5 g/dL (ref 6.5–8.1)

## 2021-05-29 LAB — IRON AND TIBC
Iron: 51 ug/dL (ref 28–170)
Saturation Ratios: 18 % (ref 10.4–31.8)
TIBC: 288 ug/dL (ref 250–450)
UIBC: 237 ug/dL

## 2021-05-29 NOTE — Progress Notes (Signed)
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 [Dr.Balch; elevated ESR]; June 2020 repeat M protein-negative  #Anemia hemoglobin 9.9/CKD stage III  #  Anterior mesenteric nodule- 16x65m [sep 2016; smaller; Feb 2016- S/p Bx- leiomyoma.]; Ilecolic LN [[5MW sep 24132 improving].2016- Octreotide scan- NEG;  CT SEP 2017- NED [elevated chromogranin  Levels- slightly]; July 2019- CT STABLE; Oct 2019- MDTC- imaging reviewed; No further surveillaince  # CKD stage III/diabetes; hemoglobin 10 [EGD-2016; colo-2018-Dr.Elliot]  #April 2021-total thyroidectomy; [Dr.Cannon] Tumor Size: 0.8 cm in greatest dimension Histologic Type: Papillary carcinoma, classic (usual, conventional); stage I; no adjuvant therapy  # Retro-tracheal duplication cyst   [place]  03/28/[place] Initial Diagnosis   [place]yroid cancer ([facility]       INTERVAL HISTORY:   65year old female patient history of iron deficiency chronic kidney disease is here for follow-up.  Denies any blood in stools or black or stools.  Denies any nausea vomiting.  Mild fatigue.   Review of Systems  Constitutional:  Positive for malaise/fatigue. Negative for chills, diaphoresis, fever and weight loss.  HENT:  Negative for nosebleeds and sore throat.   Eyes:  Negative for double vision.  Respiratory:  Negative for cough, hemoptysis, sputum production, shortness of breath and wheezing.   Cardiovascular:  Negative for chest pain, palpitations, orthopnea and leg swelling.  Gastrointestinal:  Negative for abdominal pain, blood in stool, constipation, diarrhea, heartburn, melena, nausea and vomiting.  Genitourinary:  Negative for dysuria, frequency and urgency.  Musculoskeletal:   Positive for back pain and joint pain.  Skin: Negative.  Negative for itching and rash.  Neurological:  Negative for dizziness, tingling, focal weakness, weakness and headaches.  Endo/Heme/Allergies:  Does not bruise/bleed easily.  Psychiatric/Behavioral:  Negative for depression. The patient is not nervous/anxious and does not have insomnia.     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   LAD during diagnostic PCI at [facility]   Diabetes mellitus without [place] ([place]    DM type 2 with diabetic peripheral neuropathy ([place] 09/19/2015   Dysphagia    Dysrhythmia    Exertional dyspnea    GERD (gastroesophageal reflux disease)    Headache    History of hiatal hernia    History of shingles    HLD (hyperlipidemia)    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   SVG-OM2, SVG-D2, LIMA-LAD   Hx of thyroid cancer    s/p thyroidectomy   Hypertension    IBS (irritable bowel syndrome)    Mesenteric mass    Peripheral vascular disease (HCC)    Right BBB/left ant fasc block    Rotator cuff tendinitis, left    [place]ified [place] ([place]     PAST SURGICAL HISTORY :   Past Surgical History:  Procedure Laterality Date   ABDOMINAL HYSTERECTOMY     CHOLECYSTECTOMY     COLONOSCOPY WITH PROPOFOL N/A 12/20/2016   Procedure: COLONOSCOPY WITH PROPOFOL;  Surgeon: RManya Silvas MD;  Location:  [facility];  Service: Endoscopy;  Laterality: N/A;   CORONARY ANGIOPLASTY     stents   CORONARY ARTERY BYPASS GRAFT  2002   3 vessels   ESOPHAGOGASTRODUODENOSCOPY (EGD) WITH PROPOFOL N/A 04/27/2017   Procedure: ESOPHAGOGASTRODUODENOSCOPY (EGD) WITH PROPOFOL;  Surgeon: Manya Silvas, MD;  Location: [facility] ENDOSCOPY;  Service:  Endoscopy;  Laterality: N/A;   EUS N/A 04/24/2015   Procedure: ESOPHAGEAL ENDOSCOPIC ULTRASOUND (EUS) RADIAL;  Surgeon: Cora Daniels, MD;  Location: [facility] ENDOSCOPY;  Service: Endoscopy;  Laterality: N/A;   THYROIDECTOMY, PARTIAL     TOTAL HIP ARTHROPLASTY Left 02/24/2021   Procedure: TOTAL HIP ARTHROPLASTY ANTERIOR APPROACH;  Surgeon: Hessie Knows, MD;  Location: [facility] ORS;  Service: Orthopedics;  Laterality: Left;    FAMILY HISTORY :   Family History  Problem Relation Age of Onset   Breast cancer Neg Hx     SOCIAL HISTORY:   Social History   Tobacco Use   Smoking status: Never   Smokeless tobacco: Never  Vaping Use   Vaping Use: Never used  Substance Use Topics   Alcohol use: No    Alcohol/week: 0.0 standard drinks   Drug use: No    ALLERGIES:  is allergic to other, iodinated diagnostic agents, oxycodone-acetaminophen, and etodolac.  MEDICATIONS:  Current Outpatient Medications  Medication Sig Dispense Refill   amLODipine (NORVASC) 5 MG tablet Take 5 mg by mouth daily.      atorvastatin (LIPITOR) 40 MG tablet Take 1 tablet by mouth daily at 12 noon.     Cholecalciferol 25 MCG (1000 UT) tablet Take 1 tablet by mouth daily at 12 noon.     clotrimazole (LOTRIMIN) 1 % cream Apply 1 application topically 2 (two) times daily as needed (irritation).      CONTOUR NEXT TEST test strip      dicyclomine (BENTYL) 20 MG tablet Take 20 mg by mouth 2 (two) times daily as needed for spasms.      docusate sodium (COLACE) 100 MG capsule Take 1 capsule (100 mg total) by mouth 2 (two) times daily. 20 capsule 0   FIASP 100 UNIT/ML SOLN Inject 5 Units into the skin in the morning, at noon, and at bedtime.     furosemide (LASIX) 40 MG tablet Take 40 mg by mouth daily.      gabapentin (NEURONTIN) 300 MG capsule Take 300 mg by mouth daily.     HYDROcodone-acetaminophen (NORCO) 7.5-325 MG tablet Take 1-2 tablets by mouth every 4 (four) hours as needed for severe pain (pain score 7-10). 30  tablet 0   insulin glargine (LANTUS SOLOSTAR) 100 UNIT/ML Solostar Pen Inject 35 Units into the skin at bedtime.     Iron-Vitamin C (VITRON-C) 65-125 MG TABS Take 1 tablet by mouth daily.     levothyroxine (SYNTHROID) 137 MCG tablet Take 137 mcg by mouth daily before breakfast.     Magnesium 250 MG TABS Take 250 mg by mouth daily.     metFORMIN (GLUCOPHAGE) 500 MG tablet Take 500-1,000 mg by mouth See admin instructions. Take 1 tablet (500 mg) by mouth in the morning & take 2 tablets (1000 mg) by mouth at night.     methocarbamol (ROBAXIN) 500 MG tablet Take 1 tablet (500 mg total) by mouth every 6 (six) hours as needed for muscle spasms. 30 tablet 0   Multiple Vitamin (MULTIVITAMIN WITH MINERALS) TABS tablet Take 1 tablet by mouth daily.     omeprazole (PRILOSEC) 40 MG capsule Take 40 mg by mouth daily.  sotalol (BETAPACE) 80 MG tablet Take 80 mg by mouth 2 (two) times daily.     spironolactone (ALDACTONE) 50 MG tablet Take 50 mg by mouth daily.      telmisartan (MICARDIS) 80 MG tablet Take 80 mg by mouth daily.     No current facility-administered medications for this visit.    PHYSICAL EXAMINATION: ECOG PERFORMANCE STATUS: 0 - Asymptomatic  BP (!) 120/53   Pulse (!) 53   Temp (!) 96.6 F (35.9 C)   Resp 20   Ht 5' 7"  (1.702 m)   Wt 247 lb 6.4 oz (112.2 kg)   BMI 38.75 kg/m   Filed Weights   05/29/21 1052  Weight: 247 lb 6.4 oz (112.2 kg)    Physical Exam Constitutional:      Comments: Patient is alone.  HENT:     Head: Normocephalic and atraumatic.     Mouth/Throat:     Pharynx: No oropharyngeal exudate.  Eyes:     Pupils: Pupils are equal, round, and reactive to light.  Cardiovascular:     Rate and Rhythm: Normal rate and regular rhythm.  Pulmonary:     Effort: No respiratory distress.     Breath sounds: No wheezing.  Abdominal:     General: Bowel sounds are normal. There is no distension.     Palpations: Abdomen is soft. There is no mass.     Tenderness: no  abdominal tenderness There is no guarding or rebound.  Musculoskeletal:        General: No tenderness. Normal range of motion.     Cervical back: Normal range of motion and neck supple.  Skin:    General: Skin is warm.  Neurological:     Mental Status: She is alert and oriented to person, place, and time.  Psychiatric:        Mood and Affect: Affect normal.     LABORATORY DATA:  I have reviewed the data as listed    Component Value Date/Time   NA 142 05/29/2021 1015   NA 136 03/10/2015 1640   K 4.5 05/29/2021 1015   K 2.7 (L) 03/10/2015 1640   CL 106 05/29/2021 1015   CL 96 (L) 03/10/2015 1640   CO2 28 05/29/2021 1015   CO2 31 03/10/2015 1640   GLUCOSE 133 (H) 05/29/2021 1015   GLUCOSE 99 03/10/2015 1640   BUN 38 (H) 05/29/2021 1015   BUN 16 03/10/2015 1640   CREATININE 1.47 (H) 05/29/2021 1015   CREATININE 1.18 (H) 03/10/2015 1640   CALCIUM 8.9 05/29/2021 1015   CALCIUM 9.0 03/10/2015 1640   PROT 7.5 05/29/2021 1015   PROT 7.7 03/10/2015 1640   ALBUMIN 4.0 05/29/2021 1015   ALBUMIN 4.1 03/10/2015 1640   AST 19 05/29/2021 1015   AST 34 03/10/2015 1640   ALT 12 05/29/2021 1015   ALT 39 03/10/2015 1640   ALKPHOS 67 05/29/2021 1015   ALKPHOS 60 03/10/2015 1640   BILITOT 0.7 05/29/2021 1015   BILITOT 0.4 03/10/2015 1640   GFRNONAA 39 (L) 05/29/2021 1015   GFRNONAA 50 (L) 03/10/2015 1640   GFRAA 39 (L) 07/25/2020 1252   GFRAA 58 (L) 03/10/2015 1640    No results found for: SPEP, UPEP  Lab Results  Component Value Date   WBC 6.3 05/29/2021   NEUTROABS 3.9 05/29/2021   HGB 10.9 (L) 05/29/2021   HCT 34.9 (L) 05/29/2021   MCV 88.6 05/29/2021   PLT 267 05/29/2021      Chemistry  Component Value Date/Time   NA 142 05/29/2021 1015   NA 136 03/10/2015 1640   K 4.5 05/29/2021 1015   K 2.7 (L) 03/10/2015 1640   CL 106 05/29/2021 1015   CL 96 (L) 03/10/2015 1640   CO2 28 05/29/2021 1015   CO2 31 03/10/2015 1640   BUN 38 (H) 05/29/2021 1015   BUN 16  03/10/2015 1640   CREATININE 1.47 (H) 05/29/2021 1015   CREATININE 1.18 (H) 03/10/2015 1640      Component Value Date/Time   CALCIUM 8.9 05/29/2021 1015   CALCIUM 9.0 03/10/2015 1640   ALKPHOS 67 05/29/2021 1015   ALKPHOS 60 03/10/2015 1640   AST 19 05/29/2021 1015   AST 34 03/10/2015 1640   ALT 12 05/29/2021 1015   ALT 39 03/10/2015 1640   BILITOT 0.7 05/29/2021 1015   BILITOT 0.4 03/10/2015 1640      ASSESSMENT & PLAN:   Anemia of chronic kidney failure, stage 3 (moderate) (HCC) # Anemia-hemoglobin-hemoglobin 11.1./secondary to CKD-iron deficiency.  HOLD  IV iron today.  Hold off any erythropoietin stimulating agent at this time.  #CKD stage III [GFR 43] no NSAIDs.[ Lasix q OD; [facility] nephrology];STABLE  #S/p thyroidectomy thyroid Tumor Size: 0.8 cm in greatest dimension Histologic Type: Papillary carcinoma, classic (usual, conventional).  No role for any adjuvant therapy; followed by endocrinology.  # IV access: Poor/hydration  DISPOSITION:  # in 4 months- MD; cbc/bmp- possible venofer- Dr.B     Cammie Sickle, MD 05/31/2021 9:19 PM

## 2021-05-29 NOTE — Assessment & Plan Note (Addendum)
#   Anemia-hemoglobin-hemoglobin 11.1./secondary to CKD-iron deficiency.  HOLD  IV iron today.  Hold off any erythropoietin stimulating agent at this time.  #CKD stage III [GFR 43] no NSAIDs.[ Lasix q OD; [facility] nephrology];STABLE  #S/p thyroidectomy thyroid Tumor Size: 0.8 cm in greatest dimension Histologic Type: Papillary carcinoma, classic (usual, conventional).  No role for any adjuvant therapy; followed by endocrinology.  # IV access: Poor/hydration  DISPOSITION:  # in 4 months- MD; cbc/bmp- possible venofer- Dr.B

## 2021-05-31 ENCOUNTER — Encounter: Payer: Self-pay | Admitting: Internal Medicine

## 2021-07-24 ENCOUNTER — Encounter: Payer: Self-pay | Admitting: General Surgery

## 2021-08-03 ENCOUNTER — Other Ambulatory Visit: Payer: Self-pay | Admitting: Physician Assistant

## 2021-08-03 DIAGNOSIS — Z1231 Encounter for screening mammogram for malignant neoplasm of breast: Secondary | ICD-10-CM

## 2021-09-08 ENCOUNTER — Encounter: Payer: Self-pay | Admitting: General Surgery

## 2021-10-02 ENCOUNTER — Inpatient Hospital Stay: Payer: Medicare HMO | Admitting: Nurse Practitioner

## 2021-10-02 ENCOUNTER — Other Ambulatory Visit: Payer: Self-pay

## 2021-10-02 ENCOUNTER — Inpatient Hospital Stay: Payer: Medicare HMO | Attending: Nurse Practitioner

## 2021-10-02 ENCOUNTER — Inpatient Hospital Stay: Payer: Medicare HMO

## 2021-10-02 VITALS — BP 123/79 | HR 66 | Temp 96.9°F | Resp 18 | Wt 253.3 lb

## 2021-10-02 DIAGNOSIS — N183 Chronic kidney disease, stage 3 unspecified: Secondary | ICD-10-CM | POA: Insufficient documentation

## 2021-10-02 DIAGNOSIS — D631 Anemia in chronic kidney disease: Secondary | ICD-10-CM | POA: Insufficient documentation

## 2021-10-02 DIAGNOSIS — C73 Malignant neoplasm of thyroid gland: Secondary | ICD-10-CM | POA: Diagnosis present

## 2021-10-02 DIAGNOSIS — E890 Postprocedural hypothyroidism: Secondary | ICD-10-CM | POA: Diagnosis not present

## 2021-10-02 DIAGNOSIS — Z79899 Other long term (current) drug therapy: Secondary | ICD-10-CM | POA: Insufficient documentation

## 2021-10-02 LAB — CBC WITH DIFFERENTIAL/PLATELET
Abs Immature Granulocytes: 0.01 10*3/uL (ref 0.00–0.07)
Basophils Absolute: 0.1 10*3/uL (ref 0.0–0.1)
Basophils Relative: 1 %
Eosinophils Absolute: 0.2 10*3/uL (ref 0.0–0.5)
Eosinophils Relative: 3 %
HCT: 34.7 % — ABNORMAL LOW (ref 36.0–46.0)
Hemoglobin: 11 g/dL — ABNORMAL LOW (ref 12.0–15.0)
Immature Granulocytes: 0 %
Lymphocytes Relative: 32 %
Lymphs Abs: 2.5 10*3/uL (ref 0.7–4.0)
MCH: 27.9 pg (ref 26.0–34.0)
MCHC: 31.7 g/dL (ref 30.0–36.0)
MCV: 88.1 fL (ref 80.0–100.0)
Monocytes Absolute: 0.6 10*3/uL (ref 0.1–1.0)
Monocytes Relative: 7 %
Neutro Abs: 4.3 10*3/uL (ref 1.7–7.7)
Neutrophils Relative %: 57 %
Platelets: 315 10*3/uL (ref 150–400)
RBC: 3.94 MIL/uL (ref 3.87–5.11)
RDW: 14.2 % (ref 11.5–15.5)
WBC: 7.6 10*3/uL (ref 4.0–10.5)
nRBC: 0 % (ref 0.0–0.2)

## 2021-10-02 LAB — BASIC METABOLIC PANEL
Anion gap: 9 (ref 5–15)
BUN: 33 mg/dL — ABNORMAL HIGH (ref 8–23)
CO2: 27 mmol/L (ref 22–32)
Calcium: 9.3 mg/dL (ref 8.9–10.3)
Chloride: 106 mmol/L (ref 98–111)
Creatinine, Ser: 1.33 mg/dL — ABNORMAL HIGH (ref 0.44–1.00)
GFR, Estimated: 44 mL/min — ABNORMAL LOW (ref >60)
Glucose, Bld: 84 mg/dL (ref 70–99)
Potassium: 4.3 mmol/L (ref 3.5–5.1)
Sodium: 142 mmol/L (ref 135–145)

## 2021-10-02 NOTE — Progress Notes (Signed)
[place] OFFICE PROGRESS NOTE  Patient Care Team: Revelo, Elyse Jarvis, MD as PCP - General (Family Medicine) Cammie Sickle, MD as Consulting Physician (Hematology and Oncology)  Oncology History Overview Note   SUMMARY OF ONCOLOGIC HISTORY: #October 2019-MGUS IgA kappa 0.  2 g/dL [Dr.Balch; elevated ESR]; June 2020 repeat M protein-negative  #Anemia hemoglobin 9.9/CKD stage III  #  Anterior mesenteric nodule- 16x56m [sep 2016; smaller; Feb 2016- S/p Bx- leiomyoma.]; Ilecolic LN [[2GM sep 20102 improving].2016- Octreotide scan- NEG;  CT SEP 2017- NED [elevated chromogranin  Levels- slightly]; July 2019- CT STABLE; Oct 2019- MDTC- imaging reviewed; No further surveillaince  # CKD stage III/diabetes; hemoglobin 10 [EGD-2016; colo-2018-Dr.Elliot]  #April 2021-total thyroidectomy; [Dr.Cannon] Tumor Size: 0.8 cm in greatest dimension Histologic Type: Papillary carcinoma, classic (usual, conventional); stage I; no adjuvant therapy  # Retro-tracheal duplication cyst   [place]yroid cancer ([place]  03/28/2020 Initial Diagnosis   [place]yroid cancer ([place]       INTERVAL HISTORY: Patient 65year old female with above history of iron deficiency anemia of chronic kidney disease who returns to clinic for labs and follow-up and consideration of IV iron.  Denies any bloody or black stools.  Denies any nausea or vomiting.  Has mild fatigue.   Review of Systems  Constitutional:  Positive for malaise/fatigue. Negative for chills, diaphoresis, fever and weight loss.  HENT:  Negative for nosebleeds and sore throat.   Eyes:  Negative for double vision.  Respiratory:  Negative for cough, hemoptysis, sputum production, shortness of breath and wheezing.   Cardiovascular:  Negative for chest pain, palpitations, orthopnea and leg swelling.  Gastrointestinal:  Negative for abdominal pain, blood in stool, constipation, diarrhea, heartburn, melena, nausea and vomiting.  Genitourinary:   Negative for dysuria, frequency and urgency.  Musculoskeletal:  Positive for back pain and joint pain.  Skin: Negative.  Negative for itching and rash.  Neurological:  Negative for dizziness, tingling, focal weakness, weakness and headaches.  Endo/Heme/Allergies:  Does not bruise/bleed easily.  Psychiatric/Behavioral:  Negative for depression. The patient is not nervous/anxious and does not have insomnia.     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   LAD during diagnostic PCI at [facility]   Diabetes mellitus without complication ([place]    DM type 2 with diabetic peripheral neuropathy ([place] 09/19/2015   Dysphagia    Dysrhythmia    Exertional dyspnea    GERD (gastroesophageal reflux disease)    Headache    History of hiatal hernia    History of shingles    HLD (hyperlipidemia)    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   SVG-OM2, SVG-D2, LIMA-LAD   Hx of thyroid cancer    s/p thyroidectomy   Hypertension    IBS (irritable bowel syndrome)    Mesenteric mass    Peripheral vascular disease (HCC)    Right BBB/left ant fasc block    Rotator cuff tendinitis, left    Unspecified atrial fibrillation ([place]     PAST SURGICAL HISTORY :   Past Surgical History:  Procedure Laterality Date   ABDOMINAL HYSTERECTOMY     CHOLECYSTECTOMY     COLONOSCOPY WITH PROPOFOL N/A 12/20/2016   Procedure: COLONOSCOPY  WITH PROPOFOL;  Surgeon: Manya Silvas, MD;  Location: [facility] ENDOSCOPY;  Service: Endoscopy;  Laterality: N/A;   CORONARY ANGIOPLASTY     stents   CORONARY ARTERY BYPASS GRAFT  2002   3 vessels   ESOPHAGOGASTRODUODENOSCOPY (EGD) WITH PROPOFOL N/A 04/27/2017   Procedure: ESOPHAGOGASTRODUODENOSCOPY (EGD) WITH PROPOFOL;   Surgeon: Manya Silvas, MD;  Location: [facility] ENDOSCOPY;  Service: Endoscopy;  Laterality: N/A;   EUS N/A 04/24/2015   Procedure: ESOPHAGEAL ENDOSCOPIC ULTRASOUND (EUS) RADIAL;  Surgeon: Cora Daniels, MD;  Location[facility] ENDOSCOPY;  Service: Endoscopy;  Laterality: N/A;   THYROIDECTOMY, PARTIAL     TOTAL HIP ARTHROPLASTY Left 02/24/2021   Procedure: TOTAL HIP ARTHROPLASTY ANTERIOR APPROACH;  Surgeon: Hessie Knows, MD;  Location: [facility] ORS;  Service: Orthopedics;  Laterality: Left;    FAMILY HISTORY :   Family History  Problem Relation Age of Onset   Breast cancer Neg Hx     SOCIAL HISTORY:   Social History   Tobacco Use   Smoking status: Never   Smokeless tobacco: Never  Vaping Use   Vaping Use: Never used  Substance Use Topics   Alcohol use: No    Alcohol/week: 0.0 standard drinks   Drug use: No    ALLERGIES:  is allergic to other, iodinated diagnostic agents, oxycodone-acetaminophen, and etodolac.  MEDICATIONS:  Current Outpatient Medications  Medication Sig Dispense Refill   amLODipine (NORVASC) 5 MG tablet Take 5 mg by mouth daily.      atorvastatin (LIPITOR) 40 MG tablet Take 1 tablet by mouth daily at 12 noon.     Cholecalciferol 25 MCG (1000 UT) tablet Take 1 tablet by mouth daily at 12 noon.     clotrimazole (LOTRIMIN) 1 % cream Apply 1 application topically 2 (two) times daily as needed (irritation).      CONTOUR NEXT TEST test strip      dicyclomine (BENTYL) 20 MG tablet Take 20 mg by mouth 2 (two) times daily as needed for spasms.      docusate sodium (COLACE) 100 MG capsule Take 1 capsule (100 mg total) by mouth 2 (two) times daily. 20 capsule 0   FIASP 100 UNIT/ML SOLN Inject 5 Units into the skin in the morning, at noon, and at bedtime.     furosemide (LASIX) 40 MG tablet Take 40 mg by mouth daily.      gabapentin (NEURONTIN) 300 MG capsule Take 300 mg by mouth daily.     HYDROcodone-acetaminophen (NORCO) 7.5-325 MG tablet Take 1-2 tablets by mouth  every 4 (four) hours as needed for severe pain (pain score 7-10). 30 tablet 0   insulin glargine (LANTUS SOLOSTAR) 100 UNIT/ML Solostar Pen Inject 35 Units into the skin at bedtime.     Iron-Vitamin C (VITRON-C) 65-125 MG TABS Take 1 tablet by mouth daily.     levothyroxine (SYNTHROID) 137 MCG tablet Take 137 mcg by mouth daily before breakfast.     Magnesium 250 MG TABS Take 250 mg by mouth daily.     metFORMIN (GLUCOPHAGE) 500 MG tablet Take 500-1,000 mg by mouth See admin instructions. Take 1 tablet (500 mg) by mouth in the morning & take 2 tablets (1000 mg) by mouth at night.     methocarbamol (ROBAXIN) 500 MG tablet Take 1 tablet (500 mg total) by mouth every 6 (six) hours as needed for muscle spasms. 30 tablet 0   Multiple Vitamin (MULTIVITAMIN WITH MINERALS) TABS tablet Take 1 tablet by mouth daily.     omeprazole (  PRILOSEC) 40 MG capsule Take 40 mg by mouth daily.     sotalol (BETAPACE) 80 MG tablet Take 80 mg by mouth 2 (two) times daily.     spironolactone (ALDACTONE) 50 MG tablet Take 50 mg by mouth daily.      telmisartan (MICARDIS) 80 MG tablet Take 80 mg by mouth daily.     No current facility-administered medications for this visit.    PHYSICAL EXAMINATION: ECOG PERFORMANCE STATUS: 0 - Asymptomatic  BP (!) 120/53   Pulse (!) 53   Temp (!) 96.6 F (35.9 C)   Resp 20   Ht 5' 7"  (1.702 m)   Wt 247 lb 6.4 oz (112.2 kg)   BMI 38.75 kg/m   Filed Weights   05/29/21 1052  Weight: 247 lb 6.4 oz (112.2 kg)    Physical Exam Constitutional:      Appearance: She is not ill-appearing.  Eyes:     General: No scleral icterus.    Conjunctiva/sclera: Conjunctivae normal.  Cardiovascular:     Rate and Rhythm: Normal rate and regular rhythm.  Abdominal:     General: There is no distension.     Palpations: Abdomen is soft.     Tenderness: There is no abdominal tenderness. There is no guarding.  Musculoskeletal:        General: No deformity.     Right lower leg: No edema.      Left lower leg: No edema.  Lymphadenopathy:     Cervical: No cervical adenopathy.  Skin:    General: Skin is warm and dry.  Neurological:     Mental Status: She is alert and oriented to person, place, and time. Mental status is at baseline.  Psychiatric:        Mood and Affect: Mood normal.        Behavior: Behavior normal.     LABORATORY DATA:  I have reviewed the data as listed    Component Value Date/Time   NA 142 05/29/2021 1015   NA 136 03/10/2015 1640   K 4.5 05/29/2021 1015   K 2.7 (L) 03/10/2015 1640   CL 106 05/29/2021 1015   CL 96 (L) 03/10/2015 1640   CO2 28 05/29/2021 1015   CO2 31 03/10/2015 1640   GLUCOSE 133 (H) 05/29/2021 1015   GLUCOSE 99 03/10/2015 1640   BUN 38 (H) 05/29/2021 1015   BUN 16 03/10/2015 1640   CREATININE 1.47 (H) 05/29/2021 1015   CREATININE 1.18 (H) 03/10/2015 1640   CALCIUM 8.9 05/29/2021 1015   CALCIUM 9.0 03/10/2015 1640   PROT 7.5 05/29/2021 1015   PROT 7.7 03/10/2015 1640   ALBUMIN 4.0 05/29/2021 1015   ALBUMIN 4.1 03/10/2015 1640   AST 19 05/29/2021 1015   AST 34 03/10/2015 1640   ALT 12 05/29/2021 1015   ALT 39 03/10/2015 1640   ALKPHOS 67 05/29/2021 1015   ALKPHOS 60 03/10/2015 1640   BILITOT 0.7 05/29/2021 1015   BILITOT 0.4 03/10/2015 1640   GFRNONAA 39 (L) 05/29/2021 1015   GFRNONAA 50 (L) 03/10/2015 1640   GFRAA 39 (L) 07/25/2020 1252   GFRAA 58 (L) 03/10/2015 1640    No results found for: SPEP, UPEP  Lab Results  Component Value Date   WBC 6.3 05/29/2021   NEUTROABS 3.9 05/29/2021   HGB 10.9 (L) 05/29/2021   HCT 34.9 (L) 05/29/2021   MCV 88.6 05/29/2021   PLT 267 05/29/2021      Chemistry      Component Value  Date/Time   NA 142 05/29/2021 1015   NA 136 03/10/2015 1640   K 4.5 05/29/2021 1015   K 2.7 (L) 03/10/2015 1640   CL 106 05/29/2021 1015   CL 96 (L) 03/10/2015 1640   CO2 28 05/29/2021 1015   CO2 31 03/10/2015 1640   BUN 38 (H) 05/29/2021 1015   BUN 16 03/10/2015 1640   CREATININE 1.47 (H)  05/29/2021 1015   CREATININE 1.18 (H) 03/10/2015 1640      Component Value Date/Time   CALCIUM 8.9 05/29/2021 1015   CALCIUM 9.0 03/10/2015 1640   ALKPHOS 67 05/29/2021 1015   ALKPHOS 60 03/10/2015 1640   AST 19 05/29/2021 1015   AST 34 03/10/2015 1640   ALT 12 05/29/2021 1015   ALT 39 03/10/2015 1640   BILITOT 0.7 05/29/2021 1015   BILITOT 0.4 03/10/2015 1640     Iron/TIBC/Ferritin/ %Sat    Component Value Date/Time   IRON 51 05/29/2021 1015   TIBC 288 05/29/2021 1015   FERRITIN 82 05/29/2021 1015   IRONPCTSAT 18 05/29/2021 1015     ASSESSMENT & PLAN:   1.  Anemia of chronic kidney failure-stage III. Hemoglobin is stable at 11. No microcytosis today. No IV iron today.  2.  CKD stage III-EGFR 44.  Stable.  Avoid NSAIDs and other nephrotoxic medications.  Managed by [facility] nephrology. 3.  Thyroidectomy-status post thyroidectomy for thyroid tumor.  0.8 cm in greatest dimension.  Pathology: Papillary carcinoma.  No role for adjuvant therapy.  Followed by endocrinology. 4.  Poor IV access-encouraged oral hydration.  Return to clinic in 4 months for labs (CBC, ferritin, iron studies, BMP) day later see Dr. Rogue Bussing for virtual visit.  Beckey Rutter, DNP, AGNP-C [place] [facility] [phone] (clinic)

## 2021-10-02 NOTE — Addendum Note (Signed)
Addended by: Wilford Corner on: 10/02/2021 02:12 PM   Modules accepted: Orders

## 2021-10-02 NOTE — Progress Notes (Signed)
Pt has no concerns at this time. Pt reports some hip pain but states she recently had hip surgery.

## 2021-10-05 ENCOUNTER — Telehealth: Payer: Self-pay | Admitting: Internal Medicine

## 2021-10-05 NOTE — Telephone Encounter (Signed)
Pt called and states that she wants all her appt on same day. Please call back to arrange her appt. [phone]

## 2021-10-08 ENCOUNTER — Other Ambulatory Visit: Payer: Self-pay | Admitting: Physician Assistant

## 2021-10-08 DIAGNOSIS — Z13820 Encounter for screening for osteoporosis: Secondary | ICD-10-CM

## 2021-11-17 ENCOUNTER — Other Ambulatory Visit: Payer: Self-pay | Admitting: Family Medicine

## 2021-11-17 DIAGNOSIS — Z1231 Encounter for screening mammogram for malignant neoplasm of breast: Secondary | ICD-10-CM

## 2021-12-03 ENCOUNTER — Ambulatory Visit
Admission: RE | Admit: 2021-12-03 | Discharge: 2021-12-03 | Disposition: A | Discharge status: Home / Self Care | Payer: Medicare HMO | Source: Ambulatory Visit | Attending: Physician Assistant | Admitting: Physician Assistant

## 2021-12-03 ENCOUNTER — Other Ambulatory Visit: Payer: Self-pay

## 2021-12-03 DIAGNOSIS — Z13820 Encounter for screening for osteoporosis: Secondary | ICD-10-CM | POA: Insufficient documentation

## 2021-12-03 DIAGNOSIS — E119 Type 2 diabetes mellitus without complications: Secondary | ICD-10-CM | POA: Insufficient documentation

## 2021-12-03 DIAGNOSIS — Z780 Asymptomatic menopausal state: Secondary | ICD-10-CM | POA: Diagnosis not present

## 2021-12-14 ENCOUNTER — Other Ambulatory Visit: Payer: Self-pay | Admitting: Physician Assistant

## 2021-12-14 DIAGNOSIS — M25512 Pain in left shoulder: Secondary | ICD-10-CM

## 2021-12-28 ENCOUNTER — Ambulatory Visit
Admission: RE | Admit: 2021-12-28 | Discharge: 2021-12-28 | Disposition: A | Discharge status: Home / Self Care | Payer: Medicare HMO | Source: Ambulatory Visit | Attending: Family Medicine | Admitting: Family Medicine

## 2021-12-28 ENCOUNTER — Ambulatory Visit
Admission: RE | Admit: 2021-12-28 | Discharge: 2021-12-28 | Disposition: A | Discharge status: Home / Self Care | Payer: Medicare HMO | Source: Home / Self Care | Attending: Physician Assistant | Admitting: Physician Assistant

## 2021-12-28 ENCOUNTER — Ambulatory Visit
Admission: RE | Admit: 2021-12-28 | Discharge: 2021-12-28 | Disposition: A | Discharge status: Home / Self Care | Payer: Medicare HMO | Source: Ambulatory Visit | Attending: Physician Assistant | Admitting: Physician Assistant

## 2021-12-28 ENCOUNTER — Other Ambulatory Visit: Payer: Self-pay

## 2021-12-28 DIAGNOSIS — Z1231 Encounter for screening mammogram for malignant neoplasm of breast: Secondary | ICD-10-CM | POA: Insufficient documentation

## 2021-12-28 DIAGNOSIS — M25512 Pain in left shoulder: Secondary | ICD-10-CM

## 2021-12-31 ENCOUNTER — Other Ambulatory Visit: Payer: Self-pay | Admitting: Orthopedic Surgery

## 2021-12-31 ENCOUNTER — Other Ambulatory Visit: Payer: Self-pay | Admitting: Family Medicine

## 2021-12-31 DIAGNOSIS — N6489 Other specified disorders of breast: Secondary | ICD-10-CM

## 2021-12-31 DIAGNOSIS — R928 Other abnormal and inconclusive findings on diagnostic imaging of breast: Secondary | ICD-10-CM

## 2021-12-31 DIAGNOSIS — Z96652 Presence of left artificial knee joint: Secondary | ICD-10-CM

## 2022-01-04 ENCOUNTER — Other Ambulatory Visit: Payer: Self-pay

## 2022-01-04 ENCOUNTER — Encounter
Admission: RE | Admit: 2022-01-04 | Discharge: 2022-01-04 | Disposition: A | Discharge status: Home / Self Care | Payer: Medicare HMO | Source: Ambulatory Visit | Attending: Orthopedic Surgery | Admitting: Orthopedic Surgery

## 2022-01-04 ENCOUNTER — Other Ambulatory Visit: Payer: Self-pay | Admitting: Orthopedic Surgery

## 2022-01-04 DIAGNOSIS — Z96649 Presence of unspecified artificial hip joint: Secondary | ICD-10-CM | POA: Diagnosis present

## 2022-01-04 DIAGNOSIS — T8484XA Pain due to internal orthopedic prosthetic devices, implants and grafts, initial encounter: Secondary | ICD-10-CM

## 2022-01-04 DIAGNOSIS — Z96652 Presence of left artificial knee joint: Secondary | ICD-10-CM | POA: Diagnosis present

## 2022-01-04 DIAGNOSIS — Z96642 Presence of left artificial hip joint: Secondary | ICD-10-CM | POA: Insufficient documentation

## 2022-01-04 MED ORDER — TECHNETIUM TC 99M MEDRONATE IV KIT
20.0000 | PACK | Freq: Once | INTRAVENOUS | Status: AC | PRN
  Administered 2022-01-04: 20.94 via INTRAVENOUS

## 2022-01-12 ENCOUNTER — Ambulatory Visit
Admission: RE | Admit: 2022-01-12 | Discharge: 2022-01-12 | Disposition: A | Discharge status: Home / Self Care | Payer: Medicare HMO | Source: Ambulatory Visit | Attending: Family Medicine | Admitting: Family Medicine

## 2022-01-12 ENCOUNTER — Other Ambulatory Visit: Payer: Self-pay

## 2022-01-12 DIAGNOSIS — N6489 Other specified disorders of breast: Secondary | ICD-10-CM | POA: Insufficient documentation

## 2022-01-12 DIAGNOSIS — R928 Other abnormal and inconclusive findings on diagnostic imaging of breast: Secondary | ICD-10-CM

## 2022-01-18 ENCOUNTER — Encounter: Payer: Self-pay | Admitting: *Deleted

## 2022-01-18 NOTE — Anesthesia Preprocedure Evaluation (Addendum)
Anesthesia Evaluation  Patient identified by MRN, date of birth, ID band Patient awake    Reviewed: Allergy & Precautions, NPO status , Patient's Chart, lab work & pertinent test results  History of Anesthesia Complications Negative for: history of anesthetic complications  Airway Mallampati: III       Dental  (+) Lower Dentures, Upper Dentures   Pulmonary sleep apnea , neg COPD, Not current smoker,    Pulmonary exam normal        Cardiovascular METS: 3 - Mets hypertension, Pt. on medications + CAD, + CABG (2002) and + Peripheral Vascular Disease  Normal cardiovascular exam+ dysrhythmias Atrial Fibrillation (-) Valvular Problems/Murmurs     Neuro/Psych  Headaches, neg Seizures  Neuromuscular disease negative psych ROS   GI/Hepatic Neg liver ROS, hiatal hernia, GERD  Medicated and Poorly Controlled,  Endo/Other  diabetes, Type 2, Oral Hypoglycemic AgentsHypothyroidism (s/p thyroidectomy)   Renal/GU Renal InsufficiencyRenal disease     Musculoskeletal  (+) Arthritis , Osteoarthritis,    Abdominal Normal abdominal exam  (+)   Peds negative pediatric ROS (+)  Hematology  (+) Blood dyscrasia, anemia ,   Anesthesia Other Findings Past Medical History: No date: Anemia     Comment:  iron anemia, now getting infusions 08/17/2019: Anemia of chronic kidney failure, stage 3 (moderate) (HCC) No date: Arthritis No date: Bradycardia No date: CKD (chronic kidney disease), stage III (HCC) No date: Coronary artery disease 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     Comment:  LAD during diagnostic PCI at [facility] No date: Diabetes mellitus without complication ([place]) 35/32/9924: DM type 2 with diabetic peripheral neuropathy (HCC) No date: Dysphagia No date: Dysrhythmia No date: Exertional dyspnea No date: GERD (gastroesophageal reflux disease) No date: Headache No date: History of hiatal hernia No date: History of shingles No  date: HLD (hyperlipidemia) 05/03/2001: Hx of CABG     Comment:  SVG-OM2, SVG-D2, LIMA-LAD No date: Hx of thyroid cancer     Comment:  s/p thyroidectomy No date: Hypertension No date: IBS (irritable bowel syndrome) No date: Mesenteric mass No date: Peripheral vascular disease (HCC) No date: Right BBB/left ant fasc block No date: Rotator cuff tendinitis, left No date: Unspecified atrial fibrillation ([place])  Reproductive/Obstetrics                            Anesthesia Physical  Anesthesia Plan  ASA: III  Anesthesia Plan: General   Post-op Pain Management: Minimal or no pain anticipated   Induction: Intravenous  PONV Risk Score and Plan: Treatment may vary due to age or medical condition and Propofol infusion  Airway Management Planned: Natural Airway and Simple Face Mask  Additional Equipment:   Intra-op Plan:   Post-operative Plan:   Informed Consent: I have reviewed the patients History and Physical, chart, labs and discussed the procedure including the risks, benefits and alternatives for the proposed anesthesia with the patient or authorized representative who has indicated his/her understanding and acceptance.     Dental advisory given  Plan Discussed with: CRNA and Anesthesiologist  Anesthesia Plan Comments:        Anesthesia Quick Evaluation

## 2022-01-19 ENCOUNTER — Ambulatory Visit
Admission: RE | Admit: 2022-01-19 | Discharge: 2022-01-19 | Disposition: A | Discharge status: Home / Self Care | Payer: Medicare HMO | Attending: Gastroenterology | Admitting: Gastroenterology

## 2022-01-19 ENCOUNTER — Encounter: Payer: Self-pay | Admitting: *Deleted

## 2022-01-19 ENCOUNTER — Encounter
Admission: RE | Disposition: A | Discharge status: Home / Self Care | Payer: Self-pay | Source: Home / Self Care | Attending: Gastroenterology

## 2022-01-19 ENCOUNTER — Ambulatory Visit: Payer: Medicare HMO | Admitting: Anesthesiology

## 2022-01-19 ENCOUNTER — Other Ambulatory Visit: Payer: Self-pay

## 2022-01-19 DIAGNOSIS — Z951 Presence of aortocoronary bypass graft: Secondary | ICD-10-CM | POA: Diagnosis not present

## 2022-01-19 DIAGNOSIS — K589 Irritable bowel syndrome without diarrhea: Secondary | ICD-10-CM | POA: Insufficient documentation

## 2022-01-19 DIAGNOSIS — Z1211 Encounter for screening for malignant neoplasm of colon: Secondary | ICD-10-CM | POA: Insufficient documentation

## 2022-01-19 DIAGNOSIS — Z800 Family history of malignant neoplasm of digestive organs: Secondary | ICD-10-CM | POA: Insufficient documentation

## 2022-01-19 DIAGNOSIS — E1151 Type 2 diabetes mellitus with diabetic peripheral angiopathy without gangrene: Secondary | ICD-10-CM | POA: Diagnosis not present

## 2022-01-19 DIAGNOSIS — E890 Postprocedural hypothyroidism: Secondary | ICD-10-CM | POA: Insufficient documentation

## 2022-01-19 DIAGNOSIS — Z86010 Personal history of colonic polyps: Secondary | ICD-10-CM | POA: Insufficient documentation

## 2022-01-19 DIAGNOSIS — D122 Benign neoplasm of ascending colon: Secondary | ICD-10-CM | POA: Diagnosis not present

## 2022-01-19 DIAGNOSIS — Z7984 Long term (current) use of oral hypoglycemic drugs: Secondary | ICD-10-CM | POA: Diagnosis not present

## 2022-01-19 DIAGNOSIS — E785 Hyperlipidemia, unspecified: Secondary | ICD-10-CM | POA: Diagnosis not present

## 2022-01-19 DIAGNOSIS — E1142 Type 2 diabetes mellitus with diabetic polyneuropathy: Secondary | ICD-10-CM | POA: Insufficient documentation

## 2022-01-19 DIAGNOSIS — Z794 Long term (current) use of insulin: Secondary | ICD-10-CM | POA: Insufficient documentation

## 2022-01-19 DIAGNOSIS — K5730 Diverticulosis of large intestine without perforation or abscess without bleeding: Secondary | ICD-10-CM | POA: Diagnosis not present

## 2022-01-19 DIAGNOSIS — I2510 Atherosclerotic heart disease of native coronary artery without angina pectoris: Secondary | ICD-10-CM | POA: Diagnosis not present

## 2022-01-19 DIAGNOSIS — E1122 Type 2 diabetes mellitus with diabetic chronic kidney disease: Secondary | ICD-10-CM | POA: Insufficient documentation

## 2022-01-19 DIAGNOSIS — Z85850 Personal history of malignant neoplasm of thyroid: Secondary | ICD-10-CM | POA: Diagnosis not present

## 2022-01-19 DIAGNOSIS — N183 Chronic kidney disease, stage 3 unspecified: Secondary | ICD-10-CM | POA: Insufficient documentation

## 2022-01-19 DIAGNOSIS — M1990 Unspecified osteoarthritis, unspecified site: Secondary | ICD-10-CM | POA: Insufficient documentation

## 2022-01-19 DIAGNOSIS — K219 Gastro-esophageal reflux disease without esophagitis: Secondary | ICD-10-CM | POA: Insufficient documentation

## 2022-01-19 DIAGNOSIS — G4730 Sleep apnea, unspecified: Secondary | ICD-10-CM | POA: Insufficient documentation

## 2022-01-19 DIAGNOSIS — Z79899 Other long term (current) drug therapy: Secondary | ICD-10-CM | POA: Insufficient documentation

## 2022-01-19 DIAGNOSIS — I129 Hypertensive chronic kidney disease with stage 1 through stage 4 chronic kidney disease, or unspecified chronic kidney disease: Secondary | ICD-10-CM | POA: Diagnosis not present

## 2022-01-19 DIAGNOSIS — K449 Diaphragmatic hernia without obstruction or gangrene: Secondary | ICD-10-CM | POA: Diagnosis not present

## 2022-01-19 HISTORY — PX: COLONOSCOPY WITH PROPOFOL: SHX5780

## 2022-01-19 LAB — GLUCOSE, CAPILLARY: Glucose-Capillary: 176 mg/dL — ABNORMAL HIGH (ref 70–99)

## 2022-01-19 SURGERY — COLONOSCOPY WITH PROPOFOL
Anesthesia: General

## 2022-01-19 MED ORDER — EPHEDRINE SULFATE (PRESSORS) 50 MG/ML IJ SOLN
INTRAMUSCULAR | Status: DC | PRN
Start: 2022-01-19 — End: 2022-01-19
  Administered 2022-01-19 (×2): 7.5 mg via INTRAVENOUS

## 2022-01-19 MED ORDER — PROPOFOL 500 MG/50ML IV EMUL
INTRAVENOUS | Status: DC | PRN
  Administered 2022-01-19: 145 ug/kg/min via INTRAVENOUS

## 2022-01-19 MED ORDER — SODIUM CHLORIDE 0.9 % IV SOLN: INTRAVENOUS | Status: DC

## 2022-01-19 MED ORDER — PROPOFOL 500 MG/50ML IV EMUL
INTRAVENOUS | Status: AC
  Filled 2022-01-19: qty 50

## 2022-01-19 MED ORDER — PROPOFOL 10 MG/ML IV BOLUS
INTRAVENOUS | Status: DC | PRN
  Administered 2022-01-19: 50 mg via INTRAVENOUS

## 2022-01-19 MED ORDER — LIDOCAINE HCL (PF) 1 % IJ SOLN
INTRAMUSCULAR | Status: AC
  Filled 2022-01-19: qty 2

## 2022-01-19 MED ORDER — PROPOFOL 10 MG/ML IV BOLUS
INTRAVENOUS | Status: AC
  Filled 2022-01-19: qty 20

## 2022-01-19 MED ORDER — SOD CITRATE-CITRIC ACID 500-334 MG/5ML PO SOLN
ORAL | Status: DC | PRN
  Administered 2022-01-19: 30 mL via ORAL

## 2022-01-19 MED ORDER — PHENYLEPHRINE 40 MCG/ML (10ML) SYRINGE FOR IV PUSH (FOR BLOOD PRESSURE SUPPORT)
PREFILLED_SYRINGE | INTRAVENOUS | Status: AC
  Filled 2022-01-19: qty 10

## 2022-01-19 NOTE — Interval H&P Note (Signed)
History and Physical Interval Note:  01/19/2022 7:44 AM  Hailey Howard  has presented today for surgery, with the diagnosis of H/O ADENOMATOUS POLYPS OF COLON(Z86.010).  The various methods of treatment have been discussed with the patient and family. After consideration of risks, benefits and other options for treatment, the patient has consented to  Procedure(s): COLONOSCOPY WITH PROPOFOL (N/A) as a surgical intervention.  The patient's history has been reviewed, patient examined, no change in status, stable for surgery.  I have reviewed the patient's chart and labs.  Questions were answered to the patient's satisfaction.     Lesly Rubenstein  Ok to proceed with colonoscopy

## 2022-01-19 NOTE — Anesthesia Postprocedure Evaluation (Signed)
Anesthesia Post Note  Patient: Hailey Howard  Procedure(s) Performed: COLONOSCOPY WITH PROPOFOL  Patient location during evaluation: Endoscopy Anesthesia Type: General Level of consciousness: awake and alert Pain management: pain level controlled Vital Signs Assessment: post-procedure vital signs reviewed and stable Respiratory status: spontaneous breathing, nonlabored ventilation and respiratory function stable Cardiovascular status: blood pressure returned to baseline and stable Postop Assessment: no apparent nausea or vomiting Anesthetic complications: no   No notable events documented.   Last Vitals:  Vitals:   01/19/22 0813 01/19/22 0843  BP: 139/66 (!) 127/52  Pulse: 73   Resp: 16   Temp: (!) 36.1 C   SpO2: 100%     Last Pain:  Vitals:   01/19/22 0843  TempSrc:   PainSc: 0-No pain                 Iran Ouch

## 2022-01-19 NOTE — Transfer of Care (Signed)
Immediate Anesthesia Transfer of Care Note  Patient: Hailey Howard  Procedure(s) Performed: COLONOSCOPY WITH PROPOFOL  Patient Location: PACU  Anesthesia Type:General  Level of Consciousness: awake and alert   Airway & Oxygen Therapy: Patient Spontanous Breathing and Patient connected to nasal cannula oxygen  Post-op Assessment: Report given to RN and Post -op Vital signs reviewed and stable  Post vital signs: Reviewed and stable  Last Vitals:  Vitals Value Taken Time  BP 139/66 01/19/22 0813  Temp 36.1 C 01/19/22 0813  Pulse 73 01/19/22 0814  Resp 24 01/19/22 0814  SpO2 98 % 01/19/22 0814  Vitals shown include unvalidated device data.  Last Pain:  Vitals:   01/19/22 0813  TempSrc: Temporal  PainSc: Asleep         Complications: No notable events documented.

## 2022-01-19 NOTE — Op Note (Signed)
[facility]astroenter[facility] Patient Name: Hailey Howard Procedure Date: 01/19/2022 7:44 AM MRN: [id number] Account #: [id number] Date of Birth: 1956/04/03 Admit Type: Outpatient Age: 66 Room: [facility] ENDO ROOM 1 Gender: Female Note Status: Finalized Instrument Name: Park Meo [id number] Procedure:             Colonoscopy Indications:           High risk colon cancer surveillance: Personal history                         of non-advanced adenoma, Last colonoscopy 5 years ago Providers:             Andrey Farmer MD, MD Medicines:             Monitored Anesthesia Care Complications:         No immediate complications. Estimated blood loss:                         Minimal. Procedure:             Pre-Anesthesia Assessment:                        - Prior to the procedure, a History and Physical was                         performed, and patient medications and allergies were                         reviewed. The patient is competent. The risks and                         benefits of the procedure and the sedation options and                         risks were discussed with the patient. All questions                         were answered and informed consent was obtained.                         Patient identification and proposed procedure were                         verified by the physician, the nurse, the                         anesthesiologist, the anesthetist and the technician                         in the endoscopy suite. Mental Status Examination:                         alert and oriented. Airway Examination: normal                         oropharyngeal airway and neck mobility. Respiratory                         Examination: clear to auscultation. CV Examination:  normal. Prophylactic Antibiotics: The patient does not                         require prophylactic antibiotics. Prior                         Anticoagulants: The patient  has taken no previous                         anticoagulant or antiplatelet agents. ASA Grade                         Assessment: II - A patient with mild systemic disease.                         After reviewing the risks and benefits, the patient                         was deemed in satisfactory condition to undergo the                         procedure. The anesthesia plan was to use monitored                         anesthesia care (MAC). Immediately prior to                         administration of medications, the patient was                         re-assessed for adequacy to receive sedatives. The                         heart rate, respiratory rate, oxygen saturations,                         blood pressure, adequacy of pulmonary ventilation, and                         response to care were monitored throughout the                         procedure. The physical status of the patient was                         re-assessed after the procedure.                        After obtaining informed consent, the colonoscope was                         passed under direct vision. Throughout the procedure,                         the patient's blood pressure, pulse, and oxygen                         saturations were monitored continuously. The  Colonoscope was introduced through the anus and                         advanced to the the cecum, identified by appendiceal                         orifice and ileocecal valve. The colonoscopy was                         performed without difficulty. The patient tolerated                         the procedure well. The quality of the bowel                         preparation was adequate to identify polyps. Findings:      The perianal and digital rectal examinations were normal.      A less than 1 mm polyp was found in the ascending colon. The polyp was       sessile. The polyp was removed with a jumbo cold forceps.  Resection and       retrieval were complete. Estimated blood loss was minimal.      A single small-mouthed diverticulum was found in the transverse colon.      The exam was otherwise without abnormality on direct and retroflexion       views. Impression:            - One less than 1 mm polyp in the ascending colon,                         removed with a jumbo cold forceps. Resected and                         retrieved.                        - Diverticulosis in the transverse colon.                        - The examination was otherwise normal on direct and                         retroflexion views. Recommendation:        - Discharge patient to home.                        - Resume previous diet.                        - Continue present medications.                        - Await pathology results.                        - Repeat colonoscopy in 5 years for surveillance.                        - Return to referring physician as previously  scheduled. Procedure Code(s):     --- Professional ---                        [phone], Colonoscopy, flexible; with biopsy, single or                         multiple Diagnosis Code(s):     --- Professional ---                        Z86.010, Personal history of colonic polyps                        K63.5, Polyp of colon                        K57.30, Diverticulosis of large intestine without                         perforation or abscess without bleeding CPT copyright 2019 [employer]. All rights reserved. The codes documented in this report are preliminary and upon coder review may  be revised to meet current compliance requirements. Andrey Farmer MD, MD 01/19/2022 8:12:33 AM Number of Addenda: 0 Note Initiated On: 01/19/2022 7:44 AM Scope Withdrawal Time: 0 hours 10 minutes 28 seconds  Total Procedure Duration: 0 hours 18 minutes 0 seconds  Estimated Blood Loss:  Estimated blood loss was minimal.       [facility]

## 2022-01-19 NOTE — H&P (Signed)
Outpatient short stay form Pre-procedure 01/19/2022  Hailey Rubenstein, MD  Primary Physician: Theotis Burrow, MD  Reason for visit:  Surveillance  History of present illness:    66 y/o lady with history of hypertension, hypothyroidism, IBS-C, and history of adenomatous polyps here for surveillance colonoscopy. Last colonoscopy was in 2018. History of hysterectomy and cholecystectomy. Family history of stomach cancer in her brother and colon cancer in her Mother who was older than 4 when diagnosed.    Current Facility-Administered Medications:    0.9 %  sodium chloride infusion, , Intravenous, Continuous, Deborahann Poteat, Hilton Cork, MD, Last Rate: 20 mL/hr at 01/19/22 0739, New Bag at 01/19/22 0739   lidocaine (PF) (XYLOCAINE) 1 % injection, , , ,   Facility-Administered Medications Ordered in Other Encounters:    sodium citrate-citric acid (ORACIT) solution, , Oral, Anesthesia Intra-op, Iran Ouch, MD, 30 mL at 01/19/22 7591  Medications Prior to Admission  Medication Sig Dispense Refill Last Dose   amLODipine (NORVASC) 5 MG tablet Take 5 mg by mouth daily.    01/18/2022 at 0800   atorvastatin (LIPITOR) 40 MG tablet Take 1 tablet by mouth daily at 12 noon.   01/18/2022   Cholecalciferol 25 MCG (1000 UT) tablet Take 1 tablet by mouth daily at 12 noon.   Past Week   CONTOUR NEXT TEST test strip    01/18/2022   dicyclomine (BENTYL) 20 MG tablet Take 20 mg by mouth 2 (two) times daily as needed for spasms.    Past Week   docusate sodium (COLACE) 100 MG capsule Take 100 mg by mouth 2 (two) times daily.   Past Week   FIASP 100 UNIT/ML SOLN Inject 5 Units into the skin in the morning, at noon, and at bedtime.   Past Week   fluticasone (FLONASE) 50 MCG/ACT nasal spray Place 1 spray into both nostrils daily.   01/18/2022   furosemide (LASIX) 40 MG tablet Take 40 mg by mouth daily.    01/18/2022   gabapentin (NEURONTIN) 300 MG capsule Take 300 mg by mouth daily.   01/18/2022    HYDROcodone-acetaminophen (NORCO/VICODIN) 5-325 MG tablet Take 1 tablet by mouth every 6 (six) hours as needed for moderate pain.   01/18/2022   insulin glargine (LANTUS SOLOSTAR) 100 UNIT/ML Solostar Pen Inject into the skin daily.   01/18/2022   Iron-Vitamin C (VITRON-C) 65-125 MG TABS Take 1 tablet by mouth daily.   01/18/2022   levothyroxine (SYNTHROID) 137 MCG tablet Take 137 mcg by mouth daily before breakfast.   01/18/2022   linaclotide (LINZESS) 72 MCG capsule Take 72 mcg by mouth daily before breakfast.   01/18/2022   Magnesium 250 MG TABS Take 250 mg by mouth daily.   01/18/2022   metFORMIN (GLUCOPHAGE) 500 MG tablet Take 500-1,000 mg by mouth See admin instructions. Take 1 tablet (500 mg) by mouth in the morning & take 2 tablets (1000 mg) by mouth at night.   01/18/2022   Multiple Vitamin (MULTIVITAMIN WITH MINERALS) TABS tablet Take 1 tablet by mouth daily.   01/18/2022   omeprazole (PRILOSEC) 40 MG capsule Take 40 mg by mouth daily.   01/18/2022   sotalol (BETAPACE) 80 MG tablet Take 80 mg by mouth 2 (two) times daily.   01/18/2022   spironolactone (ALDACTONE) 50 MG tablet Take 50 mg by mouth daily.    01/18/2022   telmisartan (MICARDIS) 80 MG tablet Take 80 mg by mouth daily.   01/18/2022     Allergies  Allergen  Reactions   Other Anaphylaxis, Hives and Itching    Iv dye  Confirmed with pt over the phone on 08/01/15    Iodinated Contrast Media Hives   Oxycodone-Acetaminophen Hives   Etodolac Itching     Past Medical History:  Diagnosis Date   Anemia    iron anemia, now getting infusions   Anemia of chronic kidney failure, stage 3 (moderate) (HCC) 08/17/2019   Arthritis    Bradycardia    CKD (chronic kidney disease), stage III (HCC)    Coronary artery disease    Coronary artery dissection 05/03/2001   LAD during diagnostic PCI at [facility]   Diabetes mellitus without complication ([place])    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    Physical Exam  Gen: Alert, oriented. Appears stated age.  HEENT: PERRLA. Lungs: No respiratory distress CV: RRR Abd: soft, benign, no masses Ext: No edema    Planned procedures: Proceed with colonoscopy. The patient understands the nature of the planned procedure, indications, risks, alternatives and potential complications including but not limited to bleeding, infection, perforation, damage to internal organs and possible oversedation/side effects from anesthesia. The patient agrees and gives consent to proceed.  Please refer to procedure notes for findings, recommendations and patient disposition/instructions.     Hailey Rubenstein, MD [facility]

## 2022-01-20 ENCOUNTER — Encounter: Payer: Self-pay | Admitting: Gastroenterology

## 2022-01-20 LAB — SURGICAL PATHOLOGY

## 2022-01-26 ENCOUNTER — Other Ambulatory Visit: Payer: Self-pay | Admitting: *Deleted

## 2022-01-26 DIAGNOSIS — D631 Anemia in chronic kidney disease: Secondary | ICD-10-CM

## 2022-01-28 ENCOUNTER — Encounter: Payer: Self-pay | Admitting: Internal Medicine

## 2022-02-03 ENCOUNTER — Other Ambulatory Visit: Payer: Medicare HMO

## 2022-02-05 ENCOUNTER — Other Ambulatory Visit: Payer: Self-pay

## 2022-02-05 ENCOUNTER — Inpatient Hospital Stay: Payer: Medicare HMO

## 2022-02-05 ENCOUNTER — Encounter: Payer: Self-pay | Admitting: Internal Medicine

## 2022-02-05 ENCOUNTER — Inpatient Hospital Stay: Payer: Medicare HMO | Attending: Internal Medicine | Admitting: Internal Medicine

## 2022-02-05 VITALS — BP 174/66 | HR 65 | Temp 97.2°F | Ht 67.0 in | Wt 256.8 lb

## 2022-02-05 DIAGNOSIS — Z794 Long term (current) use of insulin: Secondary | ICD-10-CM | POA: Diagnosis not present

## 2022-02-05 DIAGNOSIS — Z79899 Other long term (current) drug therapy: Secondary | ICD-10-CM | POA: Insufficient documentation

## 2022-02-05 DIAGNOSIS — D631 Anemia in chronic kidney disease: Secondary | ICD-10-CM | POA: Insufficient documentation

## 2022-02-05 DIAGNOSIS — E611 Iron deficiency: Secondary | ICD-10-CM | POA: Diagnosis present

## 2022-02-05 DIAGNOSIS — E1122 Type 2 diabetes mellitus with diabetic chronic kidney disease: Secondary | ICD-10-CM | POA: Insufficient documentation

## 2022-02-05 DIAGNOSIS — Z7984 Long term (current) use of oral hypoglycemic drugs: Secondary | ICD-10-CM | POA: Diagnosis not present

## 2022-02-05 DIAGNOSIS — N183 Chronic kidney disease, stage 3 unspecified: Secondary | ICD-10-CM | POA: Diagnosis not present

## 2022-02-05 DIAGNOSIS — Z85850 Personal history of malignant neoplasm of thyroid: Secondary | ICD-10-CM | POA: Insufficient documentation

## 2022-02-05 LAB — CBC WITH DIFFERENTIAL/PLATELET
Abs Immature Granulocytes: 0.04 10*3/uL (ref 0.00–0.07)
Basophils Absolute: 0.1 10*3/uL (ref 0.0–0.1)
Basophils Relative: 1 %
Eosinophils Absolute: 0.1 10*3/uL (ref 0.0–0.5)
Eosinophils Relative: 2 %
HCT: 35.3 % — ABNORMAL LOW (ref 36.0–46.0)
Hemoglobin: 11.1 g/dL — ABNORMAL LOW (ref 12.0–15.0)
Immature Granulocytes: 0 %
Lymphocytes Relative: 31 %
Lymphs Abs: 2.7 10*3/uL (ref 0.7–4.0)
MCH: 28 pg (ref 26.0–34.0)
MCHC: 31.4 g/dL (ref 30.0–36.0)
MCV: 88.9 fL (ref 80.0–100.0)
Monocytes Absolute: 0.6 10*3/uL (ref 0.1–1.0)
Monocytes Relative: 7 %
Neutro Abs: 5.4 10*3/uL (ref 1.7–7.7)
Neutrophils Relative %: 59 %
Platelets: 297 10*3/uL (ref 150–400)
RBC: 3.97 MIL/uL (ref 3.87–5.11)
RDW: 14 % (ref 11.5–15.5)
WBC: 9 10*3/uL (ref 4.0–10.5)
nRBC: 0 % (ref 0.0–0.2)

## 2022-02-05 LAB — BASIC METABOLIC PANEL
Anion gap: 7 (ref 5–15)
BUN: 42 mg/dL — ABNORMAL HIGH (ref 8–23)
CO2: 27 mmol/L (ref 22–32)
Calcium: 8.7 mg/dL — ABNORMAL LOW (ref 8.9–10.3)
Chloride: 103 mmol/L (ref 98–111)
Creatinine, Ser: 1.69 mg/dL — ABNORMAL HIGH (ref 0.44–1.00)
GFR, Estimated: 33 mL/min — ABNORMAL LOW (ref >60)
Glucose, Bld: 71 mg/dL (ref 70–99)
Potassium: 4.6 mmol/L (ref 3.5–5.1)
Sodium: 137 mmol/L (ref 135–145)

## 2022-02-05 LAB — FERRITIN: Ferritin: 85 ng/mL (ref 11–307)

## 2022-02-05 LAB — IRON AND TIBC
Iron: 71 ug/dL (ref 28–170)
Saturation Ratios: 23 % (ref 10.4–31.8)
TIBC: 304 ug/dL (ref 250–450)
UIBC: 233 ug/dL

## 2022-02-05 NOTE — Progress Notes (Signed)
[place] ?OFFICE PROGRESS NOTE ? ?Patient Care Team: ?Revelo, Elyse Jarvis, MD as PCP - General (Family Medicine) ?Cammie Sickle, MD as Consulting Physician (Hematology and Oncology) ? ?Oncology History Overview Note  ? ?SUMMARY OF ONCOLOGIC HISTORY: ?#October 2019-MGUS IgA kappa 0.  2 g/dL [Dr.Balch; elevated ESR]; June 2020 repeat M protein-negative ? ?#Anemia hemoglobin 9.9/CKD stage III ? ?#  Anterior mesenteric nodule- 16x34m [sep 2016; smaller; Feb 2016- S/p Bx- leiomyoma.]; Ilecolic LN [[6TK sep 21601 improving].2016- Octreotide scan- NEG;  CT SEP 2017- NED [elevated chromogranin  Levels- slightly]; July 2019- CT STABLE; Oct 2019- MDTC- imaging reviewed; No further surveillaince ? ?# CKD stage III/diabetes; hemoglobin 10 [EGD-2016; colo-2018-Dr.Elliot] ? ?#April 2021-total thyroidectomy; [Dr.Cannon] Tumor Size: 0.8 cm in greatest dimension Histologic Type: Papillary carcinoma, classic (usual, conventional); stage I; no adjuvant therapy ? ?# Retro-tracheal duplication cyst ?  ?Thyroid cancer ([place]  ?03/28/2020 Initial Diagnosis  ? Thyroid cancer ([place] ?  ? ? ? ? ?INTERVAL HISTORY: Ambulating independently.  Alone. ?  ?66year old female patient history of iron deficiency chronic kidney disease is here for follow-up. ? ?Patient has not had any hospitalizations.  She continues to be on iron.  Denies any blood in stools .  Denies any nausea vomiting.  Mild fatigue. ? ? ?Review of Systems  ?Constitutional:  Positive for malaise/fatigue. Negative for chills, diaphoresis, fever and weight loss.  ?HENT:  Negative for nosebleeds and sore throat.   ?Eyes:  Negative for double vision.  ?Respiratory:  Negative for cough, hemoptysis, sputum production, shortness of breath and wheezing.   ?Cardiovascular:  Negative for chest pain, palpitations, orthopnea and leg swelling.  ?Gastrointestinal:  Negative for abdominal pain, blood in stool, constipation, diarrhea, heartburn, melena, nausea and  vomiting.  ?Genitourinary:  Negative for dysuria, frequency and urgency.  ?Musculoskeletal:  Positive for back pain and joint pain.  ?Skin: Negative.  Negative for itching and rash.  ?Neurological:  Negative for dizziness, tingling, focal weakness, weakness and headaches.  ?Endo/Heme/Allergies:  Does not bruise/bleed easily.  ?Psychiatric/Behavioral:  Negative for depression. The patient is not nervous/anxious and does not have insomnia.   ? ? ?PAST MEDICAL HISTORY :  ?Past Medical History:  ?Diagnosis Date  ?? Anemia   ? iron anemia, now getting infusions  ?? Anemia of chronic kidney failure, stage 3 (moderate) ([place] 08/17/2019  ?? Arthritis   ?? Bradycardia   ?? CKD (chronic kidney disease), stage III ([place]   ?? Coronary artery disease   ?? 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 [facility] ?? Diabetes mellitus without complication (HG[place]   ?? DM type 2 with diabetic peripheral neuropathy (HS[place] 09/19/2015  ?? Dysphagia   ?? Dysrhythmia   ?? Exertional dyspnea   ?? GERD (gastroesophageal reflux disease)   ?? Headache   ?? History of hiatal hernia   ?? History of shingles   ?? HLD (hyperlipidemia)   ?? 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  ? SVG-OM2, SVG-D2, LIMA-LAD  ?? Hx of thyroid cancer   ? s/p thyroidectomy  ?? Hypertension   ?? IBS (irritable bowel syndrome)   ?? Mesenteric mass   ?? Peripheral vascular disease (H[place]   ?? Right BBB/left ant fasc block   ?? Rotator cuff tendinitis, left   ?? Unspecified atrial fibrillation (HC[place]   ? ? ?PAST SURGICAL HISTORY :   ?Past Surgical History:  ?Procedure Laterality Date  ?? ABDOMINAL HYSTERECTOMY    ?? CHOLECYSTECTOMY    ?? COLONOSCOPY WITH PROPOFOL N/A  12/20/2016  ? Procedure: COLONOSCOPY WITH PROPOFOL;  Surgeon: Manya Silvas, MD;  Location[facility] ENDOSCOPY;  Service: Endoscopy;  Laterality: N/A;  ?? COLONOSCOPY WITH PROPOFOL N/A 01/19/2022  ? Procedure: COLONOSCOPY WITH PROPOFOL;  Surgeon: Lesly Rubenstein, MD;  Location: [facility] ENDOSCOPY;  Service:  Endoscopy;  Laterality: N/A;  ?? CORONARY ANGIOPLASTY    ? stents  ?? CORONARY ARTERY BYPASS GRAFT  2002  ? 3 vessels  ?? ESOPHAGOGASTRODUODENOSCOPY (EGD) WITH PROPOFOL N/A 04/27/2017  ? Procedure: ESOPHAGOGASTRODUODENOSCOPY (EGD) WITH PROPOFOL;  Surgeon: Manya Silvas, MD;  Location: [facility] ENDOSCOPY;  Service: Endoscopy;  Laterality: N/A;  ?? EUS N/A 04/24/2015  ? Procedure: ESOPHAGEAL ENDOSCOPIC ULTRASOUND (EUS) RADIAL;  Surgeon: Cora Daniels, MD;  Location: [facility] ENDOSCOPY;  Service: Endoscopy;  Laterality: N/A;  ?? THYROIDECTOMY, PARTIAL    ?? TOTAL HIP ARTHROPLASTY Left 02/24/2021  ? Procedure: TOTAL HIP ARTHROPLASTY ANTERIOR APPROACH;  Surgeon: Hessie Knows, MD;  Location: [facility] ORS;  Service: Orthopedics;  Laterality: Left;  ? ? ?FAMILY HISTORY :   ?Family History  ?Problem Relation Age of Onset  ?? Breast cancer Neg Hx   ? ? ?SOCIAL HISTORY:   ?Social History  ? ?Tobacco Use  ?? Smoking status: Never  ?? Smokeless tobacco: Never  ?Vaping Use  ?? Vaping Use: Never used  ?Substance Use Topics  ?? Alcohol use: No  ?  Alcohol/week: 0.0 standard drinks  ?? Drug use: No  ? ? ?ALLERGIES:  is allergic to other, iodinated contrast media, oxycodone-acetaminophen, and etodolac. ? ?MEDICATIONS:  ?Current Outpatient Medications  ?Medication Sig Dispense Refill  ?? amLODipine (NORVASC) 5 MG tablet Take 5 mg by mouth daily.     ?? atorvastatin (LIPITOR) 40 MG tablet Take 1 tablet by mouth daily at 12 noon.    ?? Cholecalciferol 25 MCG (1000 UT) tablet Take 1 tablet by mouth daily at 12 noon.    ?? CONTOUR NEXT TEST test strip     ?? dicyclomine (BENTYL) 20 MG tablet Take 20 mg by mouth 2 (two) times daily as needed for spasms.     ?? docusate sodium (COLACE) 100 MG capsule Take 100 mg by mouth 2 (two) times daily.    ?? FIASP 100 UNIT/ML SOLN Inject 5 Units into the skin in the morning, at noon, and at bedtime.    ?? fluticasone (FLONASE) 50 MCG/ACT nasal spray Place 1 spray into both nostrils daily.    ?? furosemide  (LASIX) 40 MG tablet Take 40 mg by mouth daily.     ?? gabapentin (NEURONTIN) 300 MG capsule Take 300 mg by mouth daily.    ?? HYDROcodone-acetaminophen (NORCO/VICODIN) 5-325 MG tablet Take 1 tablet by mouth every 6 (six) hours as needed for moderate pain.    ?? insulin glargine (LANTUS SOLOSTAR) 100 UNIT/ML Solostar Pen Inject into the skin daily.    ?? Iron-Vitamin C (VITRON-C) 65-125 MG TABS Take 1 tablet by mouth daily.    ?? levothyroxine (SYNTHROID) 137 MCG tablet Take 137 mcg by mouth daily before breakfast.    ?? linaclotide (LINZESS) 72 MCG capsule Take 72 mcg by mouth daily before breakfast.    ?? Magnesium 250 MG TABS Take 250 mg by mouth daily.    ?? metFORMIN (GLUCOPHAGE) 500 MG tablet Take 500-1,000 mg by mouth See admin instructions. Take 1 tablet (500 mg) by mouth in the morning & take 2 tablets (1000 mg) by mouth at night.    ?? Multiple Vitamin (MULTIVITAMIN WITH MINERALS) TABS tablet Take 1  tablet by mouth daily.    ?? omeprazole (PRILOSEC) 40 MG capsule Take 40 mg by mouth daily.    ?? sotalol (BETAPACE) 80 MG tablet Take 80 mg by mouth 2 (two) times daily.    ?? spironolactone (ALDACTONE) 50 MG tablet Take 50 mg by mouth daily.     ?? telmisartan (MICARDIS) 80 MG tablet Take 80 mg by mouth daily.    ? ?No current facility-administered medications for this visit.  ? ? ?PHYSICAL EXAMINATION: ?ECOG PERFORMANCE STATUS: 0 - Asymptomatic ? ?BP (!) 174/66 (BP Location: Right Arm, Patient Position: Sitting, Cuff Size: Normal)   Pulse 65   Temp (!) 97.2 ?F (36.2 ?C) (Tympanic)   Ht 5' 7"  (1.702 m)   Wt 256 lb 12.8 oz (116.5 kg)   SpO2 100%   BMI 40.22 kg/m?  ? ?Filed Weights  ? 02/05/22 1328  ?Weight: 256 lb 12.8 oz (116.5 kg)  ? ? ?Physical Exam ?Constitutional:   ?   Comments: Patient is alone.  ?HENT:  ?   Head: Normocephalic and atraumatic.  ?   Mouth/Throat:  ?   Pharynx: No oropharyngeal exudate.  ?Eyes:  ?   Pupils: Pupils are equal, round, and reactive to light.  ?Cardiovascular:  ?   Rate  and Rhythm: Normal rate and regular rhythm.  ?Pulmonary:  ?   Effort: No respiratory distress.  ?   Breath sounds: No wheezing.  ?Abdominal:  ?   General: Bowel sounds are normal. There is no distension.  ?   Palpation

## 2022-02-05 NOTE — Assessment & Plan Note (Addendum)
#   Anemia-hemoglobin-hemoglobin 11.1./secondary to CKD-iron deficiency.  HOLD  IV iron today.  Hold off any erythropoietin stimulating agent at this time; on Vitron C- 2 pills at qhs.  [place] 2023- ? ?#CKD stage III [GFR 43] no NSAIDs.[ Lasix q OD; [facility]];STABLE ? ?#S/p thyroidectomy thyroid Tumor Size: 0.8 cm in greatest dimension Histologic Type: Papillary carcinoma, classic (usual, conventional).  No role for any adjuvant therapy; followed by endocrinology. ? ?# IV access: Poor/hydration ? ?DISPOSITION:  ?# NO Venofer today ?# in 6 months- MD; cbc/bmp; iron studies/ferritin- possible venofer- Dr.B ? ?

## 2022-03-15 LAB — HM DIABETES EYE EXAM

## 2022-04-07 IMAGING — CT NM BONE W/ SPECT
1 series · 12 of 14 positions shown, 15 images · non-contrast
Comparison: 02/24/21

CLINICAL DATA: Status post left total hip arthroplasty in Saturday February, 2021. Patient reports [DATE] pain in her hip.

EXAM:
NM BONE SCAN AND SPECT IMAGING
TECHNIQUE: After intravenous injection of radiopharmaceutical, delayed planar
images were obtained in multiple projections. Additionally, delayed
triplanar SPECT images were obtained through the area of interest.
RADIOPHARMACEUTICALS:  20.9 mCi Vc-UUm MDP

[Series 4: 3d bone 1.25 b70s · axial · 0.98mm/px · z∈[+1011,+1339]mm · 12 of 555 slices shown, 15 images]
[im 43/555  soft-tissue]
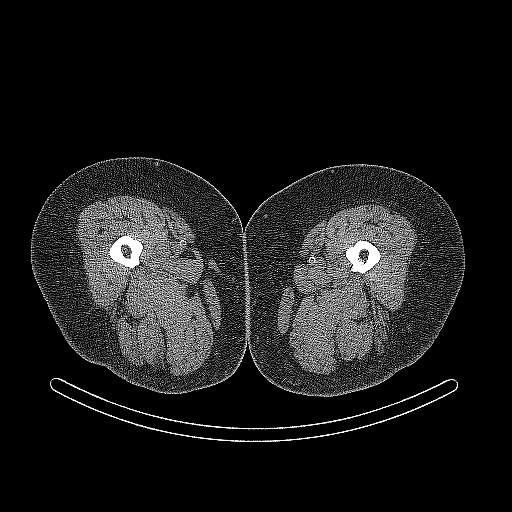
[im 43/555  bone]
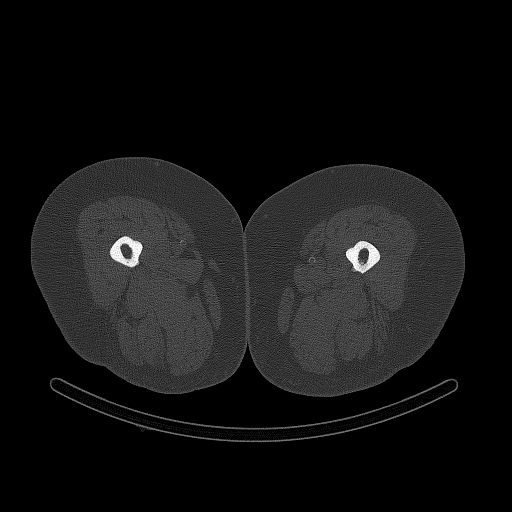
[im 86/555  bone]
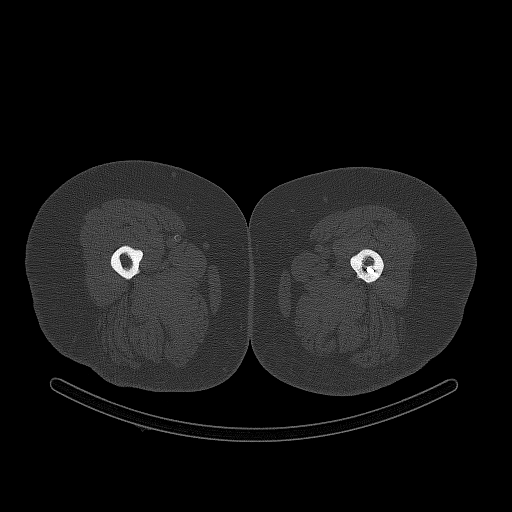
[im 128/555  bone]
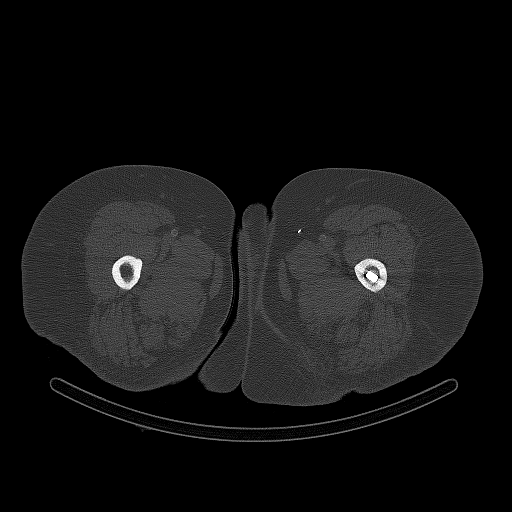
[im 171/555  bone]
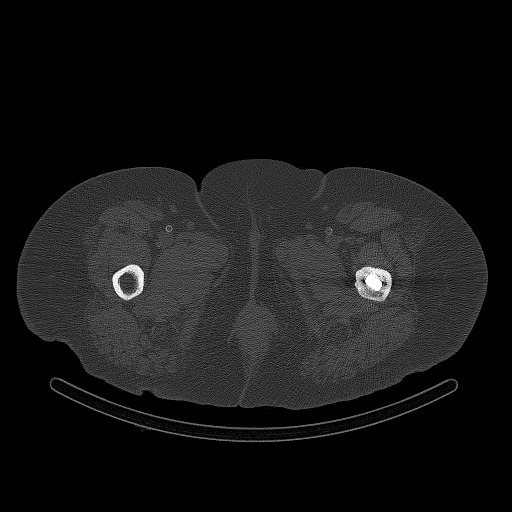
[im 214/555  soft-tissue]
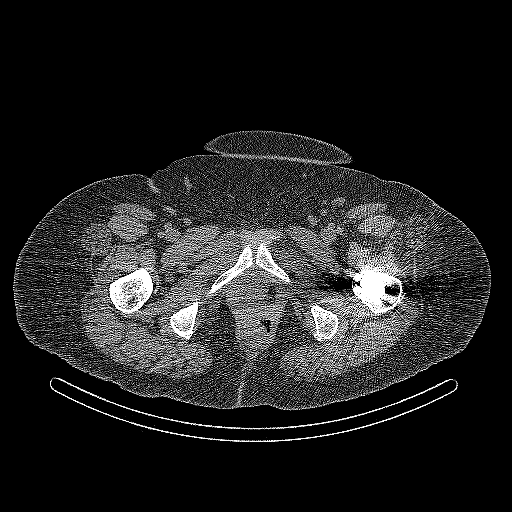
[im 214/555  bone]
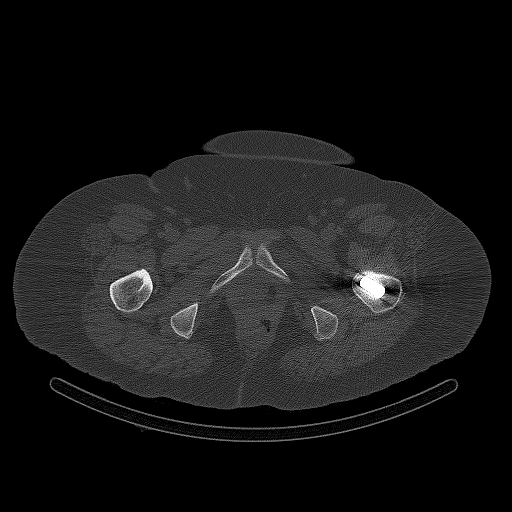
[im 256/555  bone]
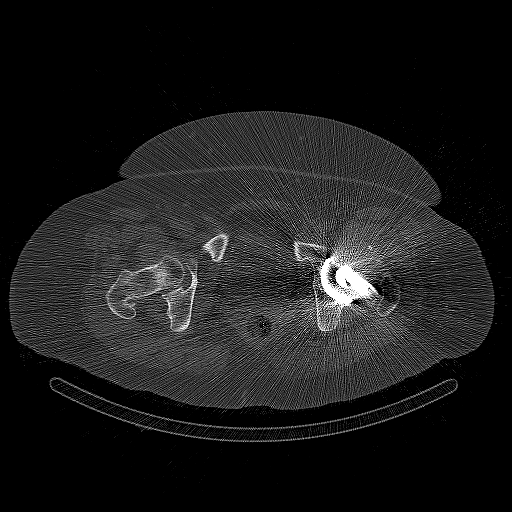
[im 299/555  bone]
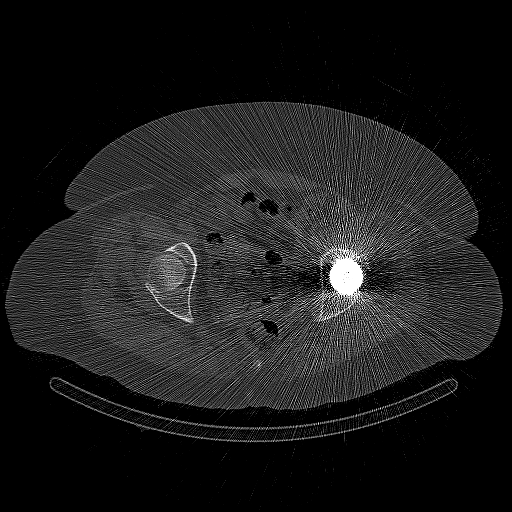
[im 341/555  bone]
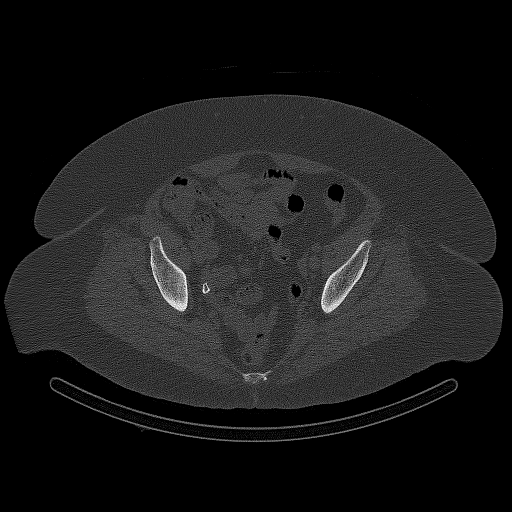
[im 384/555  soft-tissue]
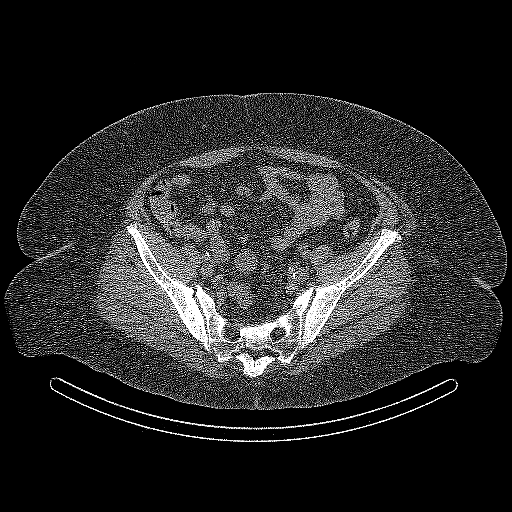
[im 384/555  bone]
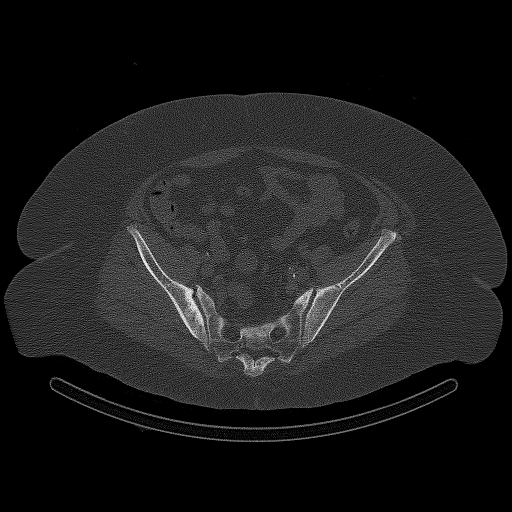
[im 427/555  bone]
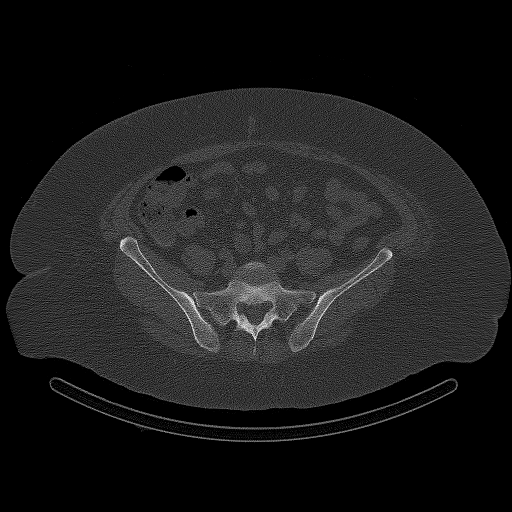
[im 469/555  bone]
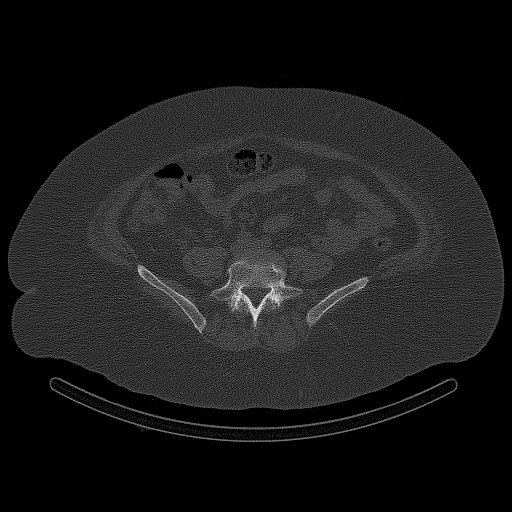
[im 512/555  bone]
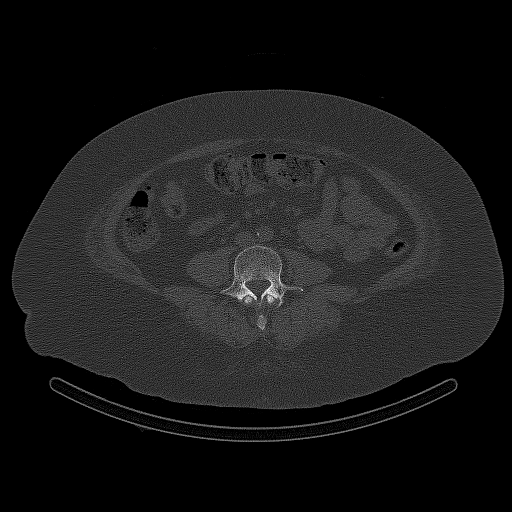

[12 of 14 positions shown; findings below may reference images not displayed]

FINDINGS: On the flow phase portion of the exam there is no abnormal
asymmetric increased radiotracer uptake to either hip.

On the blood pool phase portion of the exam there is mild asymmetric
increased radiotracer uptake localizing to the intertrochanteric
portions of the proximal left femur.

On the delayed phase images there is asymmetric increased
radiotracer uptake localizing to the bone overlying the both the
acetabular and femoral components of the left hip arthroplasty
device. Radiotracer is most intense within the intertrochanteric
portions of the proximal left femur.

On the CT portion of the examination there are no signs of
periprosthetic fracture or dislocation. Asymmetric increased
sclerosis is noted about the right SI joint concerning for
sacroiliitis.
IMPRESSION: 1. Asymmetric increased uptake on the blood pool and delayed phase
images identified which in the early postoperative time frame is
nonspecific and may reflect ongoing bone remodeling. Underlying
loosening of the left hip prosthesis would be difficult to exclude.
2. No specific findings identified to suggest infection.

## 2022-08-05 ENCOUNTER — Ambulatory Visit: Payer: Medicare HMO | Admitting: Speech Pathology

## 2022-08-06 ENCOUNTER — Ambulatory Visit: Payer: Medicare HMO

## 2022-08-06 ENCOUNTER — Other Ambulatory Visit: Payer: Medicare HMO

## 2022-08-06 ENCOUNTER — Ambulatory Visit: Payer: Medicare HMO | Admitting: Internal Medicine

## 2022-08-10 ENCOUNTER — Encounter: Payer: Medicare HMO | Admitting: Speech Pathology

## 2022-08-12 ENCOUNTER — Telehealth: Payer: Self-pay | Admitting: Speech Pathology

## 2022-08-12 ENCOUNTER — Encounter: Payer: Medicare HMO | Admitting: Speech Pathology

## 2022-08-13 NOTE — Telephone Encounter (Signed)
I explained that we will be cancelling the speech therapy at our facility and they do want her to have videostroboscopy. They are changing the referral to the [facility] and they will give her a call to schedule.

## 2022-08-17 ENCOUNTER — Encounter: Payer: Medicare HMO | Admitting: Speech Pathology

## 2022-08-19 ENCOUNTER — Encounter: Payer: Medicare HMO | Admitting: Speech Pathology

## 2022-08-19 MED FILL — Iron Sucrose Inj 20 MG/ML (Fe Equiv): INTRAVENOUS | Qty: 10 | Status: AC

## 2022-08-20 ENCOUNTER — Inpatient Hospital Stay: Payer: Medicare HMO

## 2022-08-20 ENCOUNTER — Inpatient Hospital Stay (HOSPITAL_BASED_OUTPATIENT_CLINIC_OR_DEPARTMENT_OTHER): Payer: Medicare HMO | Admitting: Internal Medicine

## 2022-08-20 ENCOUNTER — Inpatient Hospital Stay: Payer: Medicare HMO | Attending: Internal Medicine

## 2022-08-20 ENCOUNTER — Encounter: Payer: Self-pay | Admitting: Internal Medicine

## 2022-08-20 DIAGNOSIS — I129 Hypertensive chronic kidney disease with stage 1 through stage 4 chronic kidney disease, or unspecified chronic kidney disease: Secondary | ICD-10-CM | POA: Insufficient documentation

## 2022-08-20 DIAGNOSIS — D472 Monoclonal gammopathy: Secondary | ICD-10-CM | POA: Insufficient documentation

## 2022-08-20 DIAGNOSIS — E1122 Type 2 diabetes mellitus with diabetic chronic kidney disease: Secondary | ICD-10-CM | POA: Insufficient documentation

## 2022-08-20 DIAGNOSIS — N183 Chronic kidney disease, stage 3 unspecified: Secondary | ICD-10-CM

## 2022-08-20 DIAGNOSIS — D631 Anemia in chronic kidney disease: Secondary | ICD-10-CM | POA: Diagnosis not present

## 2022-08-20 DIAGNOSIS — Z85850 Personal history of malignant neoplasm of thyroid: Secondary | ICD-10-CM | POA: Insufficient documentation

## 2022-08-20 DIAGNOSIS — Z794 Long term (current) use of insulin: Secondary | ICD-10-CM | POA: Diagnosis not present

## 2022-08-20 DIAGNOSIS — Z79899 Other long term (current) drug therapy: Secondary | ICD-10-CM | POA: Insufficient documentation

## 2022-08-20 DIAGNOSIS — Z7984 Long term (current) use of oral hypoglycemic drugs: Secondary | ICD-10-CM | POA: Insufficient documentation

## 2022-08-20 LAB — BASIC METABOLIC PANEL
Anion gap: 8 (ref 5–15)
BUN: 30 mg/dL — ABNORMAL HIGH (ref 8–23)
CO2: 27 mmol/L (ref 22–32)
Calcium: 8.7 mg/dL — ABNORMAL LOW (ref 8.9–10.3)
Chloride: 105 mmol/L (ref 98–111)
Creatinine, Ser: 1.74 mg/dL — ABNORMAL HIGH (ref 0.44–1.00)
GFR, Estimated: 32 mL/min — ABNORMAL LOW (ref >60)
Glucose, Bld: 125 mg/dL — ABNORMAL HIGH (ref 70–99)
Potassium: 4.9 mmol/L (ref 3.5–5.1)
Sodium: 140 mmol/L (ref 135–145)

## 2022-08-20 LAB — CBC WITH DIFFERENTIAL/PLATELET
Abs Immature Granulocytes: 0.02 10*3/uL (ref 0.00–0.07)
Basophils Absolute: 0 10*3/uL (ref 0.0–0.1)
Basophils Relative: 1 %
Eosinophils Absolute: 0.1 10*3/uL (ref 0.0–0.5)
Eosinophils Relative: 2 %
HCT: 36.8 % (ref 36.0–46.0)
Hemoglobin: 11.6 g/dL — ABNORMAL LOW (ref 12.0–15.0)
Immature Granulocytes: 0 %
Lymphocytes Relative: 34 %
Lymphs Abs: 2.7 10*3/uL (ref 0.7–4.0)
MCH: 27.2 pg (ref 26.0–34.0)
MCHC: 31.5 g/dL (ref 30.0–36.0)
MCV: 86.4 fL (ref 80.0–100.0)
Monocytes Absolute: 0.5 10*3/uL (ref 0.1–1.0)
Monocytes Relative: 6 %
Neutro Abs: 4.5 10*3/uL (ref 1.7–7.7)
Neutrophils Relative %: 57 %
Platelets: 269 10*3/uL (ref 150–400)
RBC: 4.26 MIL/uL (ref 3.87–5.11)
RDW: 14.2 % (ref 11.5–15.5)
WBC: 7.9 10*3/uL (ref 4.0–10.5)
nRBC: 0 % (ref 0.0–0.2)

## 2022-08-20 LAB — FERRITIN: Ferritin: 63 ng/mL (ref 11–307)

## 2022-08-20 LAB — IRON AND TIBC
Iron: 72 ug/dL (ref 28–170)
Saturation Ratios: 26 % (ref 10.4–31.8)
TIBC: 283 ug/dL (ref 250–450)
UIBC: 211 ug/dL

## 2022-08-20 NOTE — Progress Notes (Signed)
[facility] OFFICE PROGRESS NOTE  Patient Care Team: Howard, Hailey Jarvis, MD as PCP - General (Family Medicine) Cammie Sickle, MD as Consulting Physician (Hematology and Oncology)  Oncology History Overview Note   SUMMARY OF ONCOLOGIC HISTORY: #October 2019-MGUS IgA kappa 0.  2 g/dL [Dr.Balch; elevated ESR]; June 2020 repeat M protein-negative  #Anemia hemoglobin 9.9/CKD stage III  #  Anterior mesenteric nodule- 16x65m [sep 2016; smaller; Feb 2016- S/p Bx- leiomyoma.]; [place] LN [[1OX sep 20960 improving].2016- Octreotide scan- NEG;  CT SEP 2017- NED [elevated chromogranin  Levels- slightly]; July 2019- CT STABLE; Oct 2019- MDTC- imaging reviewed; No further surveillaince  # CKD stage III/diabetes; hemoglobin 10 [EGD-2016; colo-2018-Dr.Elliot]  #April 2021-total thyroidectomy; [Dr.Cannon] Tumor Size: 0.8 cm in greatest dimension Histologic Type: Papillary carcinoma, classic (usual, conventional); stage I; no adjuvant therapy  # Retro-tracheal duplication cyst   Thyroid cancer ([place]  03/28/2020 Initial Diagnosis   Thyroid cancer (HD[place]       INTERVAL HISTORY: Ambulating independently.  Alone.   66year old female patient history of iron deficiency chronic kidney disease is here for follow-up.  Patient denies new problems/concerns today.   Patient has not had any hospitalizations.  She continues to be on iron.  Denies any blood in stools .  Denies any nausea vomiting.  Mild fatigue.   Review of Systems  Constitutional:  Positive for malaise/fatigue. Negative for chills, diaphoresis, fever and weight loss.  HENT:  Negative for nosebleeds and sore throat.   Eyes:  Negative for double vision.  Respiratory:  Negative for cough, hemoptysis, sputum production, shortness of breath and wheezing.   Cardiovascular:  Negative for chest pain, palpitations, orthopnea and leg swelling.  Gastrointestinal:  Negative for abdominal pain, blood in stool,  constipation, diarrhea, heartburn, melena, nausea and vomiting.  Genitourinary:  Negative for dysuria, frequency and urgency.  Musculoskeletal:  Positive for back pain and joint pain.  Skin: Negative.  Negative for itching and rash.  Neurological:  Negative for dizziness, tingling, focal weakness, weakness and headaches.  Endo/Heme/Allergies:  Does not bruise/bleed easily.  Psychiatric/Behavioral:  Negative for depression. The patient is not nervous/anxious and does not have insomnia.      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   LAD during diagnostic PCI at [facility]   Diabetes mellitus without complication ([place]    DM type 2 with diabetic peripheral neuropathy (HH[place] 09/19/2015   Dysphagia    Dysrhythmia    Exertional dyspnea    GERD (gastroesophageal reflux disease)    Headache    History of hiatal hernia    History of shingles    HLD (hyperlipidemia)    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   SVG-OM2, SVG-D2, LIMA-LAD   Hx of thyroid cancer    s/p thyroidectomy   Hypertension    IBS (irritable bowel syndrome)    Mesenteric mass    Peripheral vascular disease (HCC)    Right BBB/left ant fasc block    Rotator cuff tendinitis, left    Unspecified atrial fibrillation (HS[place]     PAST SURGICAL HISTORY :   Past Surgical History:  Procedure Laterality Date   ABDOMINAL HYSTERECTOMY     CHOLECYSTECTOMY  COLONOSCOPY WITH PROPOFOL N/A 12/20/2016   Procedure: COLONOSCOPY WITH PROPOFOL;  Surgeon: Manya Silvas, MD;  Location: [facility] ENDOSCOPY;  Service: Endoscopy;  Laterality: N/A;   COLONOSCOPY WITH PROPOFOL N/A 01/19/2022   Procedure: COLONOSCOPY WITH PROPOFOL;  Surgeon: Lesly Rubenstein, MD;  Location: [facility]  ENDOSCOPY;  Service: Endoscopy;  Laterality: N/A;   CORONARY ANGIOPLASTY     stents   CORONARY ARTERY BYPASS GRAFT  2002   3 vessels   ESOPHAGOGASTRODUODENOSCOPY (EGD) WITH PROPOFOL N/A 04/27/2017   Procedure: ESOPHAGOGASTRODUODENOSCOPY (EGD) WITH PROPOFOL;  Surgeon: Manya Silvas, MD;  Location: [facility] ENDOSCOPY;  Service: Endoscopy;  Laterality: N/A;   EUS N/A 04/24/2015   Procedure: ESOPHAGEAL ENDOSCOPIC ULTRASOUND (EUS) RADIAL;  Surgeon: Cora Daniels, MD;  Location[facility] ENDOSCOPY;  Service: Endoscopy;  Laterality: N/A;   THYROIDECTOMY, PARTIAL     TOTAL HIP ARTHROPLASTY Left 02/24/2021   Procedure: TOTAL HIP ARTHROPLASTY ANTERIOR APPROACH;  Surgeon: Hessie Knows, MD;  Location: [facility] ORS;  Service: Orthopedics;  Laterality: Left;    FAMILY HISTORY :   Family History  Problem Relation Age of Onset   Breast cancer Neg Hx     SOCIAL HISTORY:   Social History   Tobacco Use   Smoking status: Never   Smokeless tobacco: Never  Vaping Use   Vaping Use: Never used  Substance Use Topics   Alcohol use: No    Alcohol/week: 0.0 standard drinks of alcohol   Drug use: No    ALLERGIES:  is allergic to other, iodinated contrast media, oxycodone-acetaminophen, and etodolac.  MEDICATIONS:  Current Outpatient Medications  Medication Sig Dispense Refill   amLODipine (NORVASC) 5 MG tablet Take 5 mg by mouth daily.      atorvastatin (LIPITOR) 40 MG tablet Take 1 tablet by mouth daily at 12 noon.     Cholecalciferol 25 MCG (1000 UT) tablet Take 1 tablet by mouth daily at 12 noon.     dicyclomine (BENTYL) 20 MG tablet Take 20 mg by mouth 2 (two) times daily as needed for spasms.      docusate sodium (COLACE) 100 MG capsule Take 100 mg by mouth 2 (two) times daily.     FIASP 100 UNIT/ML SOLN Inject 5 Units into the skin in the morning, at noon, and at bedtime.     furosemide (LASIX) 40 MG tablet Take 40 mg by mouth daily.      gabapentin (NEURONTIN) 300 MG capsule Take 300 mg by mouth  daily.     insulin glargine (LANTUS SOLOSTAR) 100 UNIT/ML Solostar Pen Inject into the skin daily.     Iron-Vitamin C (VITRON-C) 65-125 MG TABS Take 1 tablet by mouth daily.     levothyroxine (SYNTHROID) 137 MCG tablet Take 137 mcg by mouth daily before breakfast.     linaclotide (LINZESS) 72 MCG capsule Take 72 mcg by mouth daily before breakfast.     Magnesium 250 MG TABS Take 250 mg by mouth daily.     metFORMIN (GLUCOPHAGE) 500 MG tablet Take 500-1,000 mg by mouth See admin instructions. Take 1 tablet (500 mg) by mouth in the morning & take 2 tablets (1000 mg) by mouth at night.     Multiple Vitamin (MULTIVITAMIN WITH MINERALS) TABS tablet Take 1 tablet by mouth daily.     omeprazole (PRILOSEC) 40 MG capsule Take 40 mg by mouth daily.     sotalol (BETAPACE) 80 MG tablet Take 80 mg by mouth 2 (two) times daily.     spironolactone (ALDACTONE) 50  MG tablet Take 50 mg by mouth daily.      telmisartan (MICARDIS) 80 MG tablet Take 80 mg by mouth daily.     CONTOUR NEXT TEST test strip      fluticasone (FLONASE) 50 MCG/ACT nasal spray Place 1 spray into both nostrils daily.     HYDROcodone-acetaminophen (NORCO/VICODIN) 5-325 MG tablet Take 1 tablet by mouth every 6 (six) hours as needed for moderate pain.     No current facility-administered medications for this visit.    PHYSICAL EXAMINATION: ECOG PERFORMANCE STATUS: 0 - Asymptomatic  BP (!) 140/69 (BP Location: Right Arm, Patient Position: Sitting)   Pulse (!) 53   Temp (!) 96.8 F (36 C) (Tympanic)   Resp 16   Wt 260 lb 9.6 oz (118.2 kg)   BMI 40.82 kg/m   Filed Weights   08/20/22 1400  Weight: 260 lb 9.6 oz (118.2 kg)    Physical Exam Constitutional:      Comments: Patient is alone.  HENT:     Head: Normocephalic and atraumatic.     Mouth/Throat:     Pharynx: No oropharyngeal exudate.  Eyes:     Pupils: Pupils are equal, round, and reactive to light.  Cardiovascular:     Rate and Rhythm: Normal rate and regular rhythm.   Pulmonary:     Effort: No respiratory distress.     Breath sounds: No wheezing.  Abdominal:     General: Bowel sounds are normal. There is no distension.     Palpations: Abdomen is soft. There is no mass.     Tenderness: There is no abdominal tenderness. There is no guarding or rebound.  Musculoskeletal:        General: No tenderness. Normal range of motion.     Cervical back: Normal range of motion and neck supple.  Skin:    General: Skin is warm.  Neurological:     Mental Status: She is alert and oriented to person, place, and time.  Psychiatric:        Mood and Affect: Affect normal.      LABORATORY DATA:  I have reviewed the data as listed    Component Value Date/Time   NA 140 08/20/2022 1320   NA 136 03/10/2015 1640   K 4.9 08/20/2022 1320   K 2.7 (L) 03/10/2015 1640   CL 105 08/20/2022 1320   CL 96 (L) 03/10/2015 1640   CO2 27 08/20/2022 1320   CO2 31 03/10/2015 1640   GLUCOSE 125 (H) 08/20/2022 1320   GLUCOSE 99 03/10/2015 1640   BUN 30 (H) 08/20/2022 1320   BUN 16 03/10/2015 1640   CREATININE 1.74 (H) 08/20/2022 1320   CREATININE 1.18 (H) 03/10/2015 1640   CALCIUM 8.7 (L) 08/20/2022 1320   CALCIUM 9.0 03/10/2015 1640   PROT 7.5 05/29/2021 1015   PROT 7.7 03/10/2015 1640   ALBUMIN 4.0 05/29/2021 1015   ALBUMIN 4.1 03/10/2015 1640   AST 19 05/29/2021 1015   AST 34 03/10/2015 1640   ALT 12 05/29/2021 1015   ALT 39 03/10/2015 1640   ALKPHOS 67 05/29/2021 1015   ALKPHOS 60 03/10/2015 1640   BILITOT 0.7 05/29/2021 1015   BILITOT 0.4 03/10/2015 1640   GFRNONAA 32 (L) 08/20/2022 1320   GFRNONAA 50 (L) 03/10/2015 1640   GFRAA 39 (L) 07/25/2020 1252   GFRAA 58 (L) 03/10/2015 1640    No results found for: "SPEP", "UPEP"  Lab Results  Component Value Date   WBC 7.9 08/20/2022  NEUTROABS 4.5 08/20/2022   HGB 11.6 (L) 08/20/2022   HCT 36.8 08/20/2022   MCV 86.4 08/20/2022   PLT 269 08/20/2022      Chemistry      Component Value Date/Time   NA 140  08/20/2022 1320   NA 136 03/10/2015 1640   K 4.9 08/20/2022 1320   K 2.7 (L) 03/10/2015 1640   CL 105 08/20/2022 1320   CL 96 (L) 03/10/2015 1640   CO2 27 08/20/2022 1320   CO2 31 03/10/2015 1640   BUN 30 (H) 08/20/2022 1320   BUN 16 03/10/2015 1640   CREATININE 1.74 (H) 08/20/2022 1320   CREATININE 1.18 (H) 03/10/2015 1640      Component Value Date/Time   CALCIUM 8.7 (L) 08/20/2022 1320   CALCIUM 9.0 03/10/2015 1640   ALKPHOS 67 05/29/2021 1015   ALKPHOS 60 03/10/2015 1640   AST 19 05/29/2021 1015   AST 34 03/10/2015 1640   ALT 12 05/29/2021 1015   ALT 39 03/10/2015 1640   BILITOT 0.7 05/29/2021 1015   BILITOT 0.4 03/10/2015 1640      ASSESSMENT & PLAN:   Anemia of chronic kidney failure, stage 3 (moderate) (HCC) # Anemia-hemoglobin-hemoglobin 11.6/secondary to CKD-iron deficiency.  HOLD  IV iron today.  Hold off any erythropoietin stimulating agent at this time; on Vitron C- 2 pills at qhs.  [place] 2023-  #CKD stage III [GFR 32] no NSAIDs.[ Lasix q OD; [facility]];STABLE  #S/p thyroidectomy thyroid Tumor Size: 0.8 cm in greatest dimension Histologic Type: Papillary carcinoma, classic (usual, conventional).  No role for any adjuvant therapy; followed by endocrinology.  # IV access: Poor/hydration  DISPOSITION:  # NO Venofer today # in 6 months- MD; cbc/bmp; iron studies/ferritin- possible venofer- Dr.B      Cammie Sickle, MD 08/20/2022 2:49 PM

## 2022-08-20 NOTE — Assessment & Plan Note (Addendum)
#   Anemia-hemoglobin-hemoglobin 11.6/secondary to CKD-iron deficiency.  HOLD  IV iron today.  Hold off any erythropoietin stimulating agent at this time; on Vitron C- 2 pills at qhs.  [place] 2023-  #CKD stage III [GFR 32] no NSAIDs.[ Lasix q OD; [facility]];STABLE  #S/p thyroidectomy thyroid Tumor Size: 0.8 cm in greatest dimension Histologic Type: Papillary carcinoma, classic (usual, conventional).  No role for any adjuvant therapy; followed by endocrinology.  # IV access: Poor/hydration  DISPOSITION:  # NO Venofer today # in 6 months- MD; cbc/bmp; iron studies/ferritin- possible venofer- Dr.B

## 2022-08-20 NOTE — Progress Notes (Signed)
Patient denies new problems/concerns today.   °

## 2022-08-23 ENCOUNTER — Encounter: Payer: Medicare HMO | Admitting: Speech Pathology

## 2022-08-24 ENCOUNTER — Encounter: Payer: Medicare HMO | Admitting: Speech Pathology

## 2022-08-25 ENCOUNTER — Encounter: Payer: Medicare HMO | Admitting: Speech Pathology

## 2022-08-31 ENCOUNTER — Encounter: Payer: Medicare HMO | Admitting: Speech Pathology

## 2022-09-01 ENCOUNTER — Encounter: Payer: Medicare HMO | Admitting: Speech Pathology

## 2022-09-02 ENCOUNTER — Encounter: Payer: Medicare HMO | Admitting: Speech Pathology

## 2022-09-07 ENCOUNTER — Encounter: Payer: Medicare HMO | Admitting: Speech Pathology

## 2022-09-09 ENCOUNTER — Encounter: Payer: Medicare HMO | Admitting: Speech Pathology

## 2022-09-14 ENCOUNTER — Encounter: Payer: Medicare HMO | Admitting: Speech Pathology

## 2022-09-16 ENCOUNTER — Encounter: Payer: Medicare HMO | Admitting: Speech Pathology

## 2022-09-20 ENCOUNTER — Encounter: Payer: Medicare HMO | Admitting: Speech Pathology

## 2022-09-23 ENCOUNTER — Encounter: Payer: Medicare HMO | Admitting: Speech Pathology

## 2022-09-27 ENCOUNTER — Encounter: Payer: Medicare HMO | Admitting: Speech Pathology

## 2022-09-29 ENCOUNTER — Encounter: Payer: Medicare HMO | Admitting: Speech Pathology

## 2022-10-04 ENCOUNTER — Encounter: Payer: Medicare HMO | Admitting: Speech Pathology

## 2022-10-25 ENCOUNTER — Telehealth: Payer: Self-pay

## 2022-10-25 NOTE — Telephone Encounter (Signed)
..     Pre-operative Risk Assessment    Patient Name: Hailey Howard  DOB: 1956/03/31 MRN: [id number]      Request for Surgical Clearance    Procedure:   right ulnar neuroplasty at the elbow and endoscopic carpal tunnel release  Date of Surgery:  Clearance 10/27/22                                 Surgeon:  dr Milly Jakob Surgeon's Group or Practice Name:  [facility] Phone number:  [phone] Fax number:  [phone]   Type of Clearance Requested:   - Medical    Type of Anesthesia:  Not Indicated   Additional requests/questions:    Signed, Merry Lofty   10/25/2022, 3:21 PM

## 2022-10-25 NOTE — Telephone Encounter (Addendum)
   Patient Name: LORALAI EISMAN  DOB: 07/07/1956 MRN: [id number]  Primary Cardiologist: Not a patient of our practice  Chart reviewed as part of pre-operative protocol coverage.  Patient does not follow with [facility]. Per chart review, patient's follows with Dr. Serafina Royals with [facility]. Recommend surgeon's office reach out to patient's primary cardiologist for input. If surgeon's office would like patient to establish with our practice instead, please reach out for new patient appointment. Will route this bundled recommendation to requesting provider via Epic fax function. Please call with questions.  Charlie Pitter, PA-C 10/25/2022, 4:29 PM

## 2022-10-28 LAB — MICROALBUMIN, URINE: Microalb, Ur: 85

## 2022-10-28 LAB — PROTEIN / CREATININE RATIO, URINE
Albumin, U: 33
Creatinine, Urine: 159.2

## 2022-10-28 LAB — MICROALBUMIN / CREATININE URINE RATIO: Microalb Creat Ratio: 20.7

## 2022-12-14 ENCOUNTER — Other Ambulatory Visit: Payer: Self-pay | Admitting: Nurse Practitioner

## 2022-12-14 DIAGNOSIS — Z1231 Encounter for screening mammogram for malignant neoplasm of breast: Secondary | ICD-10-CM

## 2022-12-29 ENCOUNTER — Ambulatory Visit
Admission: RE | Admit: 2022-12-29 | Discharge: 2022-12-29 | Disposition: A | Discharge status: Home / Self Care | Payer: Medicare HMO | Source: Ambulatory Visit | Attending: Nurse Practitioner | Admitting: Nurse Practitioner

## 2022-12-29 DIAGNOSIS — Z1231 Encounter for screening mammogram for malignant neoplasm of breast: Secondary | ICD-10-CM | POA: Insufficient documentation

## 2023-01-27 LAB — HEMOGLOBIN A1C: Hemoglobin A1C: 8

## 2023-02-03 LAB — HM DIABETES FOOT EXAM: HM Diabetic Foot Exam: NORMAL

## 2023-02-10 ENCOUNTER — Ambulatory Visit
Admission: RE | Admit: 2023-02-10 | Discharge: 2023-02-10 | Disposition: A | Discharge status: Home / Self Care | Payer: Medicare HMO | Source: Ambulatory Visit | Attending: Family | Admitting: Family

## 2023-02-10 ENCOUNTER — Other Ambulatory Visit: Payer: Self-pay | Admitting: Family

## 2023-02-10 ENCOUNTER — Ambulatory Visit
Admission: RE | Admit: 2023-02-10 | Discharge: 2023-02-10 | Disposition: A | Discharge status: Home / Self Care | Payer: Medicare HMO | Attending: Family | Admitting: Family

## 2023-02-10 DIAGNOSIS — R079 Chest pain, unspecified: Secondary | ICD-10-CM | POA: Insufficient documentation

## 2023-02-17 MED FILL — Iron Sucrose Inj 20 MG/ML (Fe Equiv): INTRAVENOUS | Qty: 10 | Status: AC

## 2023-02-18 ENCOUNTER — Inpatient Hospital Stay: Payer: Medicare HMO | Attending: Internal Medicine

## 2023-02-18 ENCOUNTER — Inpatient Hospital Stay (HOSPITAL_BASED_OUTPATIENT_CLINIC_OR_DEPARTMENT_OTHER): Payer: Medicare HMO | Admitting: Internal Medicine

## 2023-02-18 ENCOUNTER — Inpatient Hospital Stay: Payer: Medicare HMO

## 2023-02-18 ENCOUNTER — Encounter: Payer: Self-pay | Admitting: Internal Medicine

## 2023-02-18 VITALS — BP 123/76 | HR 56 | Temp 97.4°F | Resp 17 | Wt 254.0 lb

## 2023-02-18 DIAGNOSIS — N183 Chronic kidney disease, stage 3 unspecified: Secondary | ICD-10-CM

## 2023-02-18 DIAGNOSIS — Z862 Personal history of diseases of the blood and blood-forming organs and certain disorders involving the immune mechanism: Secondary | ICD-10-CM | POA: Insufficient documentation

## 2023-02-18 DIAGNOSIS — D631 Anemia in chronic kidney disease: Secondary | ICD-10-CM

## 2023-02-18 DIAGNOSIS — I129 Hypertensive chronic kidney disease with stage 1 through stage 4 chronic kidney disease, or unspecified chronic kidney disease: Secondary | ICD-10-CM | POA: Diagnosis present

## 2023-02-18 DIAGNOSIS — E1122 Type 2 diabetes mellitus with diabetic chronic kidney disease: Secondary | ICD-10-CM | POA: Diagnosis not present

## 2023-02-18 DIAGNOSIS — Z79899 Other long term (current) drug therapy: Secondary | ICD-10-CM | POA: Diagnosis not present

## 2023-02-18 DIAGNOSIS — Z85850 Personal history of malignant neoplasm of thyroid: Secondary | ICD-10-CM | POA: Diagnosis not present

## 2023-02-18 DIAGNOSIS — Z794 Long term (current) use of insulin: Secondary | ICD-10-CM | POA: Diagnosis not present

## 2023-02-18 DIAGNOSIS — Z7984 Long term (current) use of oral hypoglycemic drugs: Secondary | ICD-10-CM | POA: Diagnosis not present

## 2023-02-18 DIAGNOSIS — E611 Iron deficiency: Secondary | ICD-10-CM | POA: Diagnosis not present

## 2023-02-18 LAB — BASIC METABOLIC PANEL WITH GFR
Anion gap: 15 (ref 5–15)
BUN: 49 mg/dL — ABNORMAL HIGH (ref 8–23)
CO2: 22 mmol/L (ref 22–32)
Calcium: 9.6 mg/dL (ref 8.9–10.3)
Chloride: 100 mmol/L (ref 98–111)
Creatinine, Ser: 2.06 mg/dL — ABNORMAL HIGH (ref 0.44–1.00)
GFR, Estimated: 26 mL/min — ABNORMAL LOW
Glucose, Bld: 165 mg/dL — ABNORMAL HIGH (ref 70–99)
Potassium: 4.6 mmol/L (ref 3.5–5.1)
Sodium: 137 mmol/L (ref 135–145)

## 2023-02-18 LAB — CBC WITH DIFFERENTIAL/PLATELET
Abs Immature Granulocytes: 0.02 K/uL (ref 0.00–0.07)
Basophils Absolute: 0.1 K/uL (ref 0.0–0.1)
Basophils Relative: 1 %
Eosinophils Absolute: 0.2 K/uL (ref 0.0–0.5)
Eosinophils Relative: 3 %
HCT: 37.3 % (ref 36.0–46.0)
Hemoglobin: 11.9 g/dL — ABNORMAL LOW (ref 12.0–15.0)
Immature Granulocytes: 0 %
Lymphocytes Relative: 31 %
Lymphs Abs: 2.5 K/uL (ref 0.7–4.0)
MCH: 27.9 pg (ref 26.0–34.0)
MCHC: 31.9 g/dL (ref 30.0–36.0)
MCV: 87.4 fL (ref 80.0–100.0)
Monocytes Absolute: 0.5 K/uL (ref 0.1–1.0)
Monocytes Relative: 6 %
Neutro Abs: 4.8 K/uL (ref 1.7–7.7)
Neutrophils Relative %: 59 %
Platelets: 277 K/uL (ref 150–400)
RBC: 4.27 MIL/uL (ref 3.87–5.11)
RDW: 14.4 % (ref 11.5–15.5)
WBC: 8.1 K/uL (ref 4.0–10.5)
nRBC: 0 % (ref 0.0–0.2)

## 2023-02-18 LAB — IRON AND TIBC
Iron: 74 ug/dL (ref 28–170)
Saturation Ratios: 22 % (ref 10.4–31.8)
TIBC: 343 ug/dL (ref 250–450)
UIBC: 269 ug/dL

## 2023-02-18 LAB — FERRITIN: Ferritin: 106 ng/mL (ref 11–307)

## 2023-02-18 NOTE — Progress Notes (Signed)
Concerned about infusion and blood levels. States she is a hard stick and was stuck multiple times on her last visit.

## 2023-02-18 NOTE — Progress Notes (Signed)
[facility] OFFICE PROGRESS NOTE  Patient Care Team: Revelo, Elyse Jarvis, MD as PCP - General (Family Medicine) Cammie Sickle, MD as Consulting Physician (Hematology and Oncology)  Oncology History Overview Note   SUMMARY OF ONCOLOGIC HISTORY: #October 2019-MGUS IgA kappa 0.  2 g/dL [Dr.Balch; elevated ESR]; June 2020 repeat M protein-negative  #Anemia hemoglobin 9.9/CKD stage III  #  Anterior mesenteric nodule- 16x57mm [sep 2016; smaller; Feb 2016- S/p Bx- leiomyoma.]; [place] [id number]; sep [id number]; improving].2016- Octreotide scan- NEG;  CT SEP 2017- NED [elevated chromogranin  Levels- slightly]; July 2019- CT STABLE; Oct 2019- MDTC- imaging reviewed; No further surveillaince  # CKD stage III/diabetes; hemoglobin 10 [EGD-2016; colo-2018-Dr.Elliot]  #April 2021-total thyroidectomy; [Dr.Cannon] Tumor Size: 0.8 cm in greatest dimension Histologic Type: Papillary carcinoma, classic (usual, conventional); stage I; no adjuvant therapy  # Retro-tracheal duplication cyst   Thyroid cancer ([place])  03/28/2020 Initial Diagnosis   Thyroid cancer ([place])       INTERVAL HISTORY: Ambulating independently.  Alone.   68 year old female patient history of iron deficiency chronic kidney disease is here for follow-up.  Patient reports abdominal spasm.  Was recently started on Linzess and Bentyl.  Has chronic issues with alternating diarrhea and constipation.  Continues to take her oral iron. She is hard stick and had to be stuck multiple times today for the blood draw.  She just had blood work done for her kidney doctor couple of weeks ago.  She is hoping if we can consolidate her blood work.  Review of Systems  Constitutional:  Positive for malaise/fatigue. Negative for chills, diaphoresis, fever and weight loss.  HENT:  Negative for nosebleeds and sore throat.   Eyes:  Negative for double vision.  Respiratory:  Negative for cough, hemoptysis, sputum production, shortness of  breath and wheezing.   Cardiovascular:  Negative for chest pain, palpitations, orthopnea and leg swelling.  Gastrointestinal:  Positive for constipation and diarrhea. Negative for abdominal pain, blood in stool, heartburn, melena, nausea and vomiting.  Genitourinary:  Negative for dysuria, frequency and urgency.  Musculoskeletal:  Positive for back pain and joint pain.  Skin: Negative.  Negative for itching and rash.  Neurological:  Negative for dizziness, tingling, focal weakness, weakness and headaches.  Endo/Heme/Allergies:  Does not bruise/bleed easily.  Psychiatric/Behavioral:  Negative for depression. The patient is not nervous/anxious and does not have insomnia.      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   Dysphagia    Dysrhythmia    Exertional dyspnea    GERD (gastroesophageal reflux disease)    Headache    History of hiatal hernia    History of shingles    HLD (hyperlipidemia)    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   SVG-OM2, SVG-D2, LIMA-LAD   Hx of thyroid cancer    s/p thyroidectomy   Hypertension    IBS (irritable bowel syndrome)    Mesenteric mass    Peripheral vascular disease ([place])    Right BBB/left ant fasc block  Rotator cuff tendinitis, left    Unspecified atrial fibrillation ([place])     PAST SURGICAL HISTORY :   Past Surgical History:  Procedure Laterality Date   ABDOMINAL HYSTERECTOMY     CHOLECYSTECTOMY     COLONOSCOPY WITH PROPOFOL N/A 12/20/2016   Procedure: COLONOSCOPY WITH PROPOFOL;  Surgeon: Manya Silvas, MD;  Location:  [facility] ENDOSCOPY;  Service: Endoscopy;  Laterality: N/A;   COLONOSCOPY WITH PROPOFOL N/A 01/19/2022   Procedure: COLONOSCOPY WITH PROPOFOL;  Surgeon: Lesly Rubenstein, MD;  Location: [facility] ENDOSCOPY;  Service: Endoscopy;  Laterality: N/A;   CORONARY ANGIOPLASTY     stents   CORONARY ARTERY BYPASS GRAFT  2002   3 vessels   ESOPHAGOGASTRODUODENOSCOPY (EGD) WITH PROPOFOL N/A 04/27/2017   Procedure: ESOPHAGOGASTRODUODENOSCOPY (EGD) WITH PROPOFOL;  Surgeon: Manya Silvas, MD;  Location: [facility] ENDOSCOPY;  Service: Endoscopy;  Laterality: N/A;   EUS N/A 04/24/2015   Procedure: ESOPHAGEAL ENDOSCOPIC ULTRASOUND (EUS) RADIAL;  Surgeon: Cora Daniels, MD;  Location: [facility] ENDOSCOPY;  Service: Endoscopy;  Laterality: N/A;   THYROIDECTOMY, PARTIAL     TOTAL HIP ARTHROPLASTY Left 02/24/2021   Procedure: TOTAL HIP ARTHROPLASTY ANTERIOR APPROACH;  Surgeon: Hessie Knows, MD;  Location: [facility] ORS;  Service: Orthopedics;  Laterality: Left;    FAMILY HISTORY :   Family History  Problem Relation Age of Onset   Breast cancer Neg Hx     SOCIAL HISTORY:   Social History   Tobacco Use   Smoking status: Never   Smokeless tobacco: Never  Vaping Use   Vaping Use: Never used  Substance Use Topics   Alcohol use: No    Alcohol/week: 0.0 standard drinks of alcohol   Drug use: No    ALLERGIES:  is allergic to other, iodinated contrast media, oxycodone-acetaminophen, and etodolac.  MEDICATIONS:  Current Outpatient Medications  Medication Sig Dispense Refill   amLODipine (NORVASC) 5 MG tablet Take 5 mg by mouth daily.      aspirin EC 81 MG tablet Take 81 mg by mouth once.     atorvastatin (LIPITOR) 40 MG tablet Take 1 tablet by mouth daily at 12 noon.     Cholecalciferol 25 MCG (1000 UT) tablet Take 1 tablet by mouth daily at 12 noon.     ciclopirox (LOPROX) 0.77 % cream Apply topically 2 (two) times daily.     CONTOUR NEXT TEST test strip      dicyclomine (BENTYL) 20 MG tablet Take 20 mg by mouth 2  (two) times daily as needed for spasms.      FIASP 100 UNIT/ML SOLN Inject 5 Units into the skin in the morning, at noon, and at bedtime.     fluticasone (FLONASE) 50 MCG/ACT nasal spray Place 1 spray into both nostrils daily.     gabapentin (NEURONTIN) 300 MG capsule Take 300 mg by mouth daily.     HYDROcodone-acetaminophen (NORCO/VICODIN) 5-325 MG tablet Take 1 tablet by mouth every 6 (six) hours as needed for moderate pain.     insulin glargine (LANTUS SOLOSTAR) 100 UNIT/ML Solostar Pen Inject into the skin daily.     Iron-Vitamin C (VITRON-C) 65-125 MG TABS Take 1 tablet by mouth daily.     levothyroxine (SYNTHROID) 137 MCG tablet Take 137 mcg by mouth daily before breakfast.     linaclotide (LINZESS) 72 MCG capsule Take 72 mcg by mouth daily before breakfast.     Magnesium 250 MG TABS Take 250 mg by mouth daily.     metFORMIN (GLUCOPHAGE) 500 MG  tablet Take 500-1,000 mg by mouth See admin instructions. Take 1 tablet (500 mg) by mouth in the morning & take 2 tablets (1000 mg) by mouth at night.     Multiple Vitamin (MULTIVITAMIN WITH MINERALS) TABS tablet Take 1 tablet by mouth daily.     omeprazole (PRILOSEC) 40 MG capsule Take 40 mg by mouth daily.     sotalol (BETAPACE) 80 MG tablet Take 80 mg by mouth 2 (two) times daily.     spironolactone (ALDACTONE) 50 MG tablet Take 50 mg by mouth daily.      telmisartan (MICARDIS) 80 MG tablet Take 80 mg by mouth daily.     torsemide (DEMADEX) 10 MG tablet Take 10 mg by mouth daily.     docusate sodium (COLACE) 100 MG capsule Take 100 mg by mouth 2 (two) times daily. (Patient not taking: Reported on 02/18/2023)     furosemide (LASIX) 40 MG tablet Take 40 mg by mouth daily.  (Patient not taking: Reported on 02/18/2023)     No current facility-administered medications for this visit.    PHYSICAL EXAMINATION: ECOG PERFORMANCE STATUS: 0 - Asymptomatic  BP 123/76   Pulse (!) 56   Temp (!) 97.4 F (36.3 C) (Tympanic)   Resp 17   Wt 254 lb (115.2  kg)   SpO2 100%   BMI 39.78 kg/m   Filed Weights   02/18/23 1341  Weight: 254 lb (115.2 kg)    Physical Exam Constitutional:      Comments: Patient is alone.  HENT:     Head: Normocephalic and atraumatic.     Mouth/Throat:     Pharynx: No oropharyngeal exudate.  Eyes:     Pupils: Pupils are equal, round, and reactive to light.  Cardiovascular:     Rate and Rhythm: Normal rate and regular rhythm.  Pulmonary:     Effort: No respiratory distress.     Breath sounds: No wheezing.  Abdominal:     General: Bowel sounds are normal. There is no distension.     Palpations: Abdomen is soft. There is no mass.     Tenderness: There is no abdominal tenderness. There is no guarding or rebound.  Musculoskeletal:        General: No tenderness. Normal range of motion.     Cervical back: Normal range of motion and neck supple.  Skin:    General: Skin is warm.  Neurological:     Mental Status: She is alert and oriented to person, place, and time.  Psychiatric:        Mood and Affect: Affect normal.      LABORATORY DATA:  I have reviewed the data as listed    Component Value Date/Time   NA 137 02/18/2023 1316   NA 136 03/10/2015 1640   K 4.6 02/18/2023 1316   K 2.7 (L) 03/10/2015 1640   CL 100 02/18/2023 1316   CL 96 (L) 03/10/2015 1640   CO2 22 02/18/2023 1316   CO2 31 03/10/2015 1640   GLUCOSE 165 (H) 02/18/2023 1316   GLUCOSE 99 03/10/2015 1640   BUN 49 (H) 02/18/2023 1316   BUN 16 03/10/2015 1640   CREATININE 2.06 (H) 02/18/2023 1316   CREATININE 1.18 (H) 03/10/2015 1640   CALCIUM 9.6 02/18/2023 1316   CALCIUM 9.0 03/10/2015 1640   PROT 7.5 05/29/2021 1015   PROT 7.7 03/10/2015 1640   ALBUMIN 4.0 05/29/2021 1015   ALBUMIN 4.1 03/10/2015 1640   AST 19 05/29/2021 1015   AST  34 03/10/2015 1640   ALT 12 05/29/2021 1015   ALT 39 03/10/2015 1640   ALKPHOS 67 05/29/2021 1015   ALKPHOS 60 03/10/2015 1640   BILITOT 0.7 05/29/2021 1015   BILITOT 0.4 03/10/2015 1640    GFRNONAA 26 (L) 02/18/2023 1316   GFRNONAA 50 (L) 03/10/2015 1640   GFRAA 39 (L) 07/25/2020 1252   GFRAA 58 (L) 03/10/2015 1640    No results found for: "SPEP", "UPEP"  Lab Results  Component Value Date   WBC 8.1 02/18/2023   NEUTROABS 4.8 02/18/2023   HGB 11.9 (L) 02/18/2023   HCT 37.3 02/18/2023   MCV 87.4 02/18/2023   PLT 277 02/18/2023      Chemistry      Component Value Date/Time   NA 137 02/18/2023 1316   NA 136 03/10/2015 1640   K 4.6 02/18/2023 1316   K 2.7 (L) 03/10/2015 1640   CL 100 02/18/2023 1316   CL 96 (L) 03/10/2015 1640   CO2 22 02/18/2023 1316   CO2 31 03/10/2015 1640   BUN 49 (H) 02/18/2023 1316   BUN 16 03/10/2015 1640   CREATININE 2.06 (H) 02/18/2023 1316   CREATININE 1.18 (H) 03/10/2015 1640      Component Value Date/Time   CALCIUM 9.6 02/18/2023 1316   CALCIUM 9.0 03/10/2015 1640   ALKPHOS 67 05/29/2021 1015   ALKPHOS 60 03/10/2015 1640   AST 19 05/29/2021 1015   AST 34 03/10/2015 1640   ALT 12 05/29/2021 1015   ALT 39 03/10/2015 1640   BILITOT 0.7 05/29/2021 1015   BILITOT 0.4 03/10/2015 1640      ASSESSMENT & PLAN:   Anemia of chronic kidney failure, stage 3 (moderate) (HCC) # Anemia-hemoglobin-hemoglobin 11.9/secondary to CKD-iron deficiency.  Hold IV Venofer.  Continue with iron pills. Patient is hard stick and had multiple attempts today for the blood draw.  Prefers to have the blood work consolidated.  She goes to her primary every 3 months for A1c check.  We provided labs prescription for CBC with differential/BMP/iron panel/ferritin to be done beginning of October with her PCP.  #CKD stage III [GFR 32] no NSAIDs.[ Lasix q OD; [facility] neph[facility]];STABLE   #S/p thyroidectomy thyroid Tumor Size: 0.8 cm in greatest dimension Histologic Type: Papillary carcinoma, classic (usual, conventional).  No role for any adjuvant therapy; followed by endocrinology.   # IV access: Poor/hydration   DISPOSITION:  # In 6 months for follow-up with  Dr. Jacinto Reap, labs, possible Venofer      Jane Canary, MD 02/18/2023 2:40 PM

## 2023-03-03 ENCOUNTER — Other Ambulatory Visit: Payer: Self-pay | Admitting: Orthopedic Surgery

## 2023-03-03 DIAGNOSIS — M4807 Spinal stenosis, lumbosacral region: Secondary | ICD-10-CM

## 2023-03-07 ENCOUNTER — Ambulatory Visit
Admission: RE | Admit: 2023-03-07 | Discharge: 2023-03-07 | Disposition: A | Discharge status: Home / Self Care | Payer: Medicare HMO | Source: Ambulatory Visit | Attending: Orthopedic Surgery | Admitting: Orthopedic Surgery

## 2023-03-07 DIAGNOSIS — M4807 Spinal stenosis, lumbosacral region: Secondary | ICD-10-CM | POA: Diagnosis present

## 2023-03-08 ENCOUNTER — Encounter: Payer: Self-pay | Admitting: *Deleted

## 2023-03-14 ENCOUNTER — Encounter: Payer: Self-pay | Admitting: *Deleted

## 2023-03-14 ENCOUNTER — Encounter: Payer: Self-pay | Admitting: Internal Medicine

## 2023-03-15 ENCOUNTER — Encounter
Admission: RE | Disposition: A | Discharge status: Home / Self Care | Payer: Self-pay | Source: Home / Self Care | Attending: Gastroenterology

## 2023-03-15 ENCOUNTER — Encounter: Payer: Self-pay | Admitting: *Deleted

## 2023-03-15 ENCOUNTER — Other Ambulatory Visit: Payer: Self-pay

## 2023-03-15 ENCOUNTER — Ambulatory Visit: Payer: Medicare HMO | Admitting: Anesthesiology

## 2023-03-15 ENCOUNTER — Ambulatory Visit
Admission: RE | Admit: 2023-03-15 | Discharge: 2023-03-15 | Disposition: A | Discharge status: Home / Self Care | Payer: Medicare HMO | Attending: Gastroenterology | Admitting: Gastroenterology

## 2023-03-15 DIAGNOSIS — Z951 Presence of aortocoronary bypass graft: Secondary | ICD-10-CM | POA: Insufficient documentation

## 2023-03-15 DIAGNOSIS — G4730 Sleep apnea, unspecified: Secondary | ICD-10-CM | POA: Diagnosis not present

## 2023-03-15 DIAGNOSIS — E785 Hyperlipidemia, unspecified: Secondary | ICD-10-CM | POA: Diagnosis not present

## 2023-03-15 DIAGNOSIS — I4891 Unspecified atrial fibrillation: Secondary | ICD-10-CM | POA: Insufficient documentation

## 2023-03-15 DIAGNOSIS — Q399 Congenital malformation of esophagus, unspecified: Secondary | ICD-10-CM | POA: Diagnosis not present

## 2023-03-15 DIAGNOSIS — K589 Irritable bowel syndrome without diarrhea: Secondary | ICD-10-CM | POA: Diagnosis not present

## 2023-03-15 DIAGNOSIS — N183 Chronic kidney disease, stage 3 unspecified: Secondary | ICD-10-CM | POA: Insufficient documentation

## 2023-03-15 DIAGNOSIS — E890 Postprocedural hypothyroidism: Secondary | ICD-10-CM | POA: Insufficient documentation

## 2023-03-15 DIAGNOSIS — D631 Anemia in chronic kidney disease: Secondary | ICD-10-CM | POA: Insufficient documentation

## 2023-03-15 DIAGNOSIS — Z85850 Personal history of malignant neoplasm of thyroid: Secondary | ICD-10-CM | POA: Insufficient documentation

## 2023-03-15 DIAGNOSIS — R1310 Dysphagia, unspecified: Secondary | ICD-10-CM | POA: Insufficient documentation

## 2023-03-15 DIAGNOSIS — Z7984 Long term (current) use of oral hypoglycemic drugs: Secondary | ICD-10-CM | POA: Insufficient documentation

## 2023-03-15 DIAGNOSIS — Q394 Esophageal web: Secondary | ICD-10-CM | POA: Diagnosis not present

## 2023-03-15 DIAGNOSIS — I129 Hypertensive chronic kidney disease with stage 1 through stage 4 chronic kidney disease, or unspecified chronic kidney disease: Secondary | ICD-10-CM | POA: Diagnosis not present

## 2023-03-15 DIAGNOSIS — Z794 Long term (current) use of insulin: Secondary | ICD-10-CM | POA: Insufficient documentation

## 2023-03-15 DIAGNOSIS — E1122 Type 2 diabetes mellitus with diabetic chronic kidney disease: Secondary | ICD-10-CM | POA: Insufficient documentation

## 2023-03-15 DIAGNOSIS — I2510 Atherosclerotic heart disease of native coronary artery without angina pectoris: Secondary | ICD-10-CM | POA: Diagnosis not present

## 2023-03-15 DIAGNOSIS — K449 Diaphragmatic hernia without obstruction or gangrene: Secondary | ICD-10-CM | POA: Insufficient documentation

## 2023-03-15 DIAGNOSIS — K219 Gastro-esophageal reflux disease without esophagitis: Secondary | ICD-10-CM | POA: Diagnosis not present

## 2023-03-15 DIAGNOSIS — E1151 Type 2 diabetes mellitus with diabetic peripheral angiopathy without gangrene: Secondary | ICD-10-CM | POA: Diagnosis not present

## 2023-03-15 DIAGNOSIS — Z79899 Other long term (current) drug therapy: Secondary | ICD-10-CM | POA: Insufficient documentation

## 2023-03-15 HISTORY — PX: ESOPHAGOGASTRODUODENOSCOPY: SHX5428

## 2023-03-15 LAB — GLUCOSE, CAPILLARY: Glucose-Capillary: 108 mg/dL — ABNORMAL HIGH (ref 70–99)

## 2023-03-15 SURGERY — EGD (ESOPHAGOGASTRODUODENOSCOPY)
Anesthesia: General

## 2023-03-15 MED ORDER — LIDOCAINE HCL (CARDIAC) PF 100 MG/5ML IV SOSY
PREFILLED_SYRINGE | INTRAVENOUS | Status: DC | PRN
  Administered 2023-03-15: 100 mg via INTRAVENOUS

## 2023-03-15 MED ORDER — LIDOCAINE HCL (PF) 1 % IJ SOLN
INTRAMUSCULAR | Status: AC
  Filled 2023-03-15: qty 2

## 2023-03-15 MED ORDER — PROPOFOL 1000 MG/100ML IV EMUL
INTRAVENOUS | Status: AC
  Filled 2023-03-15: qty 100

## 2023-03-15 MED ORDER — SODIUM CHLORIDE 0.9 % IV SOLN
INTRAVENOUS | Status: DC
  Administered 2023-03-15: 1000 mL via INTRAVENOUS

## 2023-03-15 MED ORDER — PROPOFOL 500 MG/50ML IV EMUL
INTRAVENOUS | Status: DC | PRN
  Administered 2023-03-15: 150 ug/kg/min via INTRAVENOUS

## 2023-03-15 MED ORDER — LIDOCAINE HCL (PF) 2 % IJ SOLN
INTRAMUSCULAR | Status: AC
  Filled 2023-03-15: qty 5

## 2023-03-15 NOTE — Transfer of Care (Signed)
Immediate Anesthesia Transfer of Care Note  Patient: Hailey Howard  Procedure(s) Performed: ESOPHAGOGASTRODUODENOSCOPY (EGD)  Patient Location: PACU  Anesthesia Type:General  Level of Consciousness: awake and sedated  Airway & Oxygen Therapy: Patient Spontanous Breathing and Patient connected to face mask oxygen  Post-op Assessment: Report given to RN and Post -op Vital signs reviewed and stable  Post vital signs: Reviewed and stable  Last Vitals:  Vitals Value Taken Time  BP 85/48 03/15/23 0830  Temp 36.2 C 03/15/23 0830  Pulse 51 03/15/23 0831  Resp 17 03/15/23 0831  SpO2 100 % 03/15/23 0831  Vitals shown include unvalidated device data.  Last Pain:  Vitals:   03/15/23 0830  TempSrc: Temporal  PainSc: Asleep         Complications: There were no known notable events for this encounter.

## 2023-03-15 NOTE — Op Note (Addendum)
[facility] Gastroenterology Patient Name: Hailey Howard Procedure Date: 03/15/2023 8:08 AM MRN: [id number] Account #: [id number] Date of Birth: June 18, 1956 Admit Type: Outpatient Age: 67 Room: [facility] ENDO ROOM 1 Gender: Female Note Status: Finalized Instrument Name: Upper Endoscope [id number] Procedure:             Upper GI endoscopy Indications:           Dysphagia Providers:             Eather Colas MD, MD Referring MD:          Presley Raddle Revelo (Referring MD) Medicines:             Monitored Anesthesia Care Complications:         No immediate complications. Procedure:             Pre-Anesthesia Assessment:                        - Prior to the procedure, a History and Physical was                         performed, and patient medications and allergies were                         reviewed. The patient is competent. The risks and                         benefits of the procedure and the sedation options and                         risks were discussed with the patient. All questions                         were answered and informed consent was obtained.                         Patient identification and proposed procedure were                         verified by the physician, the nurse, the                         anesthesiologist, the anesthetist and the technician                         in the endoscopy suite. Mental Status Examination:                         alert and oriented. Airway Examination: normal                         oropharyngeal airway and neck mobility. Respiratory                         Examination: clear to auscultation. CV Examination:                         normal. Prophylactic Antibiotics: The patient does not  require prophylactic antibiotics. Prior                         Anticoagulants: The patient has taken no anticoagulant                         or antiplatelet agents. ASA Grade Assessment: III - A                          patient with severe systemic disease. After reviewing                         the risks and benefits, the patient was deemed in                         satisfactory condition to undergo the procedure. The                         anesthesia plan was to use monitored anesthesia care                         (MAC). Immediately prior to administration of                         medications, the patient was re-assessed for adequacy                         to receive sedatives. The heart rate, respiratory                         rate, oxygen saturations, blood pressure, adequacy of                         pulmonary ventilation, and response to care were                         monitored throughout the procedure. The physical                         status of the patient was re-assessed after the                         procedure.                        After obtaining informed consent, the endoscope was                         passed under direct vision. Throughout the procedure,                         the patient's blood pressure, pulse, and oxygen                         saturations were monitored continuously. The Endoscope                         was introduced through the mouth, and advanced to the  second part of duodenum. The upper GI endoscopy was                         accomplished without difficulty. The patient tolerated                         the procedure well. 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 A TTS dilator was       passed through the scope. Dilation with a 15-16.5-18 mm balloon dilator       was performed to 18 mm. The dilation site was examined and showed no       change. Estimated blood loss: none.      The distal esophagus was mildly tortuous.      The entire examined stomach was normal.      The examined duodenum was normal. 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  Dilated.                        - Tortuous esophagus.                        - Normal stomach.                        - Normal examined duodenum.                        - No specimens collected. Recommendation:        - Discharge patient to home.                        - Resume previous diet.                        - Continue present medications.                        - Return to referring physician as previously                         scheduled. Suspect dysphagia is likely secondary to                         previous surgery and tortuous esophagus. 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 All rights reserved. The codes documented in this report are preliminary and upon coder review may  be revised to meet current compliance requirements. Eather Colas MD, MD 03/15/2023 8:35:20 AM Number of Addenda: 0 Note Initiated On: 03/15/2023 8:08 AM Estimated Blood Loss:  Estimated blood loss: none.      [facility]

## 2023-03-15 NOTE — H&P (Signed)
Outpatient short stay form Pre-procedure 03/15/2023  Regis Bill, MD  Primary Physician: Preston Fleeting, MD  Reason for visit:  Dysphagia  History of present illness:    67 y/o lady with history of hypertension, obesity, DM II, and hypothyroidism here for solid food dysphagia that got worse after thyroidectomy. No blood thinners. No family history of GI malignancies. No other neck surgeries.    Current Facility-Administered Medications:    0.9 %  sodium chloride infusion, , Intravenous, Continuous, Rolando Whitby, Rossie Muskrat, MD, Last Rate: 20 mL/hr at 03/15/23 0812, 1,000 mL at 03/15/23 1610  Medications Prior to Admission  Medication Sig Dispense Refill Last Dose   amLODipine (NORVASC) 5 MG tablet Take 5 mg by mouth daily.    03/15/2023   aspirin EC 81 MG tablet Take 81 mg by mouth once.   03/14/2023   FIASP 100 UNIT/ML SOLN Inject 5 Units into the skin in the morning, at noon, and at bedtime.   03/15/2023 at 0600   insulin glargine (LANTUS SOLOSTAR) 100 UNIT/ML Solostar Pen Inject into the skin daily.   03/14/2023   levothyroxine (SYNTHROID) 137 MCG tablet Take 137 mcg by mouth daily before breakfast.   03/15/2023   linaclotide (LINZESS) 72 MCG capsule Take 72 mcg by mouth daily before breakfast.   03/14/2023   metFORMIN (GLUCOPHAGE) 500 MG tablet Take 500-1,000 mg by mouth See admin instructions. Take 1 tablet (500 mg) by mouth in the morning & take 2 tablets (1000 mg) by mouth at night.   03/14/2023   sotalol (BETAPACE) 80 MG tablet Take 80 mg by mouth 2 (two) times daily.   03/15/2023   spironolactone (ALDACTONE) 50 MG tablet Take 50 mg by mouth daily.    03/14/2023   telmisartan (MICARDIS) 80 MG tablet Take 80 mg by mouth daily.   03/14/2023   torsemide (DEMADEX) 10 MG tablet Take 10 mg by mouth daily.   03/15/2023   atorvastatin (LIPITOR) 40 MG tablet Take 1 tablet by mouth daily at 12 noon. (Patient not taking: Reported on 03/15/2023)   Not Taking   Cholecalciferol 25 MCG (1000  UT) tablet Take 1 tablet by mouth daily at 12 noon.      ciclopirox (LOPROX) 0.77 % cream Apply topically 2 (two) times daily.      CONTOUR NEXT TEST test strip       dicyclomine (BENTYL) 20 MG tablet Take 20 mg by mouth 2 (two) times daily as needed for spasms.       docusate sodium (COLACE) 100 MG capsule Take 100 mg by mouth 2 (two) times daily.      fluticasone (FLONASE) 50 MCG/ACT nasal spray Place 1 spray into both nostrils daily.      furosemide (LASIX) 40 MG tablet Take 40 mg by mouth daily.  (Patient not taking: Reported on 02/18/2023)      gabapentin (NEURONTIN) 300 MG capsule Take 300 mg by mouth daily.      HYDROcodone-acetaminophen (NORCO/VICODIN) 5-325 MG tablet Take 1 tablet by mouth every 6 (six) hours as needed for moderate pain.      Iron-Vitamin C (VITRON-C) 65-125 MG TABS Take 1 tablet by mouth daily.      Magnesium 250 MG TABS Take 250 mg by mouth daily.      Multiple Vitamin (MULTIVITAMIN WITH MINERALS) TABS tablet Take 1 tablet by mouth daily.      omeprazole (PRILOSEC) 40 MG capsule Take 40 mg by mouth daily.  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    Physical Exam  Gen: Alert, oriented. Appears stated age.  HEENT: PERRLA. Lungs: No respiratory distress CV: RRR Abd: soft, benign, no masses Ext: No edema    Planned procedures: Proceed with EGD. The patient understands the nature of the planned procedure, indications, risks, alternatives and potential complications including but not limited to bleeding, infection, perforation, damage to internal organs and possible oversedation/side effects from anesthesia. The patient agrees and gives consent to proceed.  Please refer to procedure notes for findings, recommendations and patient disposition/instructions.     Regis Bill, MD [facility] Gastroenterology

## 2023-03-15 NOTE — Anesthesia Preprocedure Evaluation (Addendum)
Anesthesia Evaluation  Patient identified by MRN, date of birth, ID band Patient awake    Reviewed: Allergy & Precautions, NPO status , Patient's Chart, lab work & pertinent test results  History of Anesthesia Complications Negative for: history of anesthetic complications  Airway Mallampati: III       Dental  (+) Lower Dentures, Upper Dentures   Pulmonary sleep apnea , neg COPD, Not current smoker   Pulmonary exam normal        Cardiovascular METS: 3 - Mets hypertension, Pt. on medications + CAD, + CABG (2002) and + Peripheral Vascular Disease  Normal cardiovascular exam+ dysrhythmias Atrial Fibrillation (-) Valvular Problems/Murmurs     Neuro/Psych  Headaches, neg Seizures  Neuromuscular disease  negative psych ROS   GI/Hepatic Neg liver ROS, hiatal hernia,GERD  Medicated and Poorly Controlled,,PHAYRNGOESOPHAGEAL DYSPHAGIA   Endo/Other  diabetes, Type 2, Oral Hypoglycemic AgentsHypothyroidism (s/p thyroidectomy)    Renal/GU Renal InsufficiencyRenal disease     Musculoskeletal  (+) Arthritis , Osteoarthritis,    Abdominal Normal abdominal exam  (+)   Peds negative pediatric ROS (+)  Hematology  (+) Blood dyscrasia, anemia   Anesthesia Other Findings Past Medical History: No date: Anemia     Comment:  iron anemia, now getting infusions 08/17/2019: Anemia of chronic kidney failure, stage 3 (moderate) (HCC) No date: Arthritis No date: Bradycardia No date: CKD (chronic kidney disease), stage III (HCC) No date: Coronary artery disease 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     Comment:  LAD during diagnostic PCI at [facility] No date: Diabetes mellitus without complication (HCC) 09/19/2015: DM type 2 with diabetic peripheral neuropathy (HCC) No date: Dysphagia No date: Dysrhythmia No date: Exertional dyspnea No date: GERD (gastroesophageal reflux disease) No date: Headache No date: History of hiatal hernia No date:  History of shingles No date: HLD (hyperlipidemia) 05/03/2001: Hx of CABG     Comment:  SVG-OM2, SVG-D2, LIMA-LAD No date: Hx of thyroid cancer     Comment:  s/p thyroidectomy No date: Hypertension No date: IBS (irritable bowel syndrome) No date: Mesenteric mass No date: Peripheral vascular disease (HCC) No date: Right BBB/left ant fasc block No date: Rotator cuff tendinitis, left No date: Unspecified atrial fibrillation (HCC)  Reproductive/Obstetrics                             Anesthesia Physical Anesthesia Plan  ASA: III  Anesthesia Plan: General   Post-op Pain Management: Minimal or no pain anticipated   Induction: Intravenous  PONV Risk Score and Plan: Treatment may vary due to age or medical condition and Propofol infusion  Airway Management Planned: Natural Airway and Simple Face Mask  Additional Equipment:   Intra-op Plan:   Post-operative Plan:   Informed Consent: I have reviewed the patients History and Physical, chart, labs and discussed the procedure including the risks, benefits and alternatives for the proposed anesthesia with the patient or authorized representative who has indicated his/her understanding and acceptance.     Dental advisory given  Plan Discussed with: CRNA and Anesthesiologist  Anesthesia Plan Comments:         Anesthesia Quick Evaluation

## 2023-03-15 NOTE — Interval H&P Note (Signed)
History and Physical Interval Note:  03/15/2023 8:15 AM  Hailey Howard  has presented today for surgery, with the diagnosis of PHAYRNGOESOPHAGEAL DYSPHAGIA.  The various methods of treatment have been discussed with the patient and family. After consideration of risks, benefits and other options for treatment, the patient has consented to  Procedure(s): ESOPHAGOGASTRODUODENOSCOPY (EGD) (N/A) as a surgical intervention.  The patient's history has been reviewed, patient examined, no change in status, stable for surgery.  I have reviewed the patient's chart and labs.  Questions were answered to the patient's satisfaction.     Regis Bill  Ok to proceed with EGD

## 2023-03-16 ENCOUNTER — Encounter: Payer: Self-pay | Admitting: Gastroenterology

## 2023-03-16 NOTE — Anesthesia Postprocedure Evaluation (Signed)
Anesthesia Post Note  Patient: Hailey Howard  Procedure(s) Performed: ESOPHAGOGASTRODUODENOSCOPY (EGD)  Patient location during evaluation: PACU Anesthesia Type: General Level of consciousness: awake and alert Pain management: pain level controlled Vital Signs Assessment: post-procedure vital signs reviewed and stable Respiratory status: spontaneous breathing, nonlabored ventilation and respiratory function stable Cardiovascular status: blood pressure returned to baseline and stable Postop Assessment: no apparent nausea or vomiting Anesthetic complications: no   There were no known notable events for this encounter.   Last Vitals:  Vitals:   03/15/23 0839 03/15/23 0849  BP: (!) 95/51 134/68  Pulse: (!) 51 (!) 53  Resp: 17 (!) 22  Temp:    SpO2: 100% 100%    Last Pain:  Vitals:   03/15/23 0849  TempSrc:   PainSc: 0-No pain                 Foye Deer

## 2023-04-05 DIAGNOSIS — M5416 Radiculopathy, lumbar region: Secondary | ICD-10-CM | POA: Diagnosis not present

## 2023-04-05 DIAGNOSIS — M48062 Spinal stenosis, lumbar region with neurogenic claudication: Secondary | ICD-10-CM | POA: Diagnosis not present

## 2023-04-05 DIAGNOSIS — M4807 Spinal stenosis, lumbosacral region: Secondary | ICD-10-CM | POA: Diagnosis not present

## 2023-04-05 DIAGNOSIS — M7062 Trochanteric bursitis, left hip: Secondary | ICD-10-CM | POA: Diagnosis not present

## 2023-04-13 DIAGNOSIS — E669 Obesity, unspecified: Secondary | ICD-10-CM | POA: Diagnosis not present

## 2023-04-13 DIAGNOSIS — E785 Hyperlipidemia, unspecified: Secondary | ICD-10-CM | POA: Diagnosis not present

## 2023-04-13 DIAGNOSIS — E119 Type 2 diabetes mellitus without complications: Secondary | ICD-10-CM | POA: Diagnosis not present

## 2023-04-13 DIAGNOSIS — I10 Essential (primary) hypertension: Secondary | ICD-10-CM | POA: Diagnosis not present

## 2023-04-13 DIAGNOSIS — Z951 Presence of aortocoronary bypass graft: Secondary | ICD-10-CM | POA: Diagnosis not present

## 2023-04-13 DIAGNOSIS — I480 Paroxysmal atrial fibrillation: Secondary | ICD-10-CM | POA: Diagnosis not present

## 2023-04-13 DIAGNOSIS — I9789 Other postprocedural complications and disorders of the circulatory system, not elsewhere classified: Secondary | ICD-10-CM | POA: Diagnosis not present

## 2023-04-13 DIAGNOSIS — I2510 Atherosclerotic heart disease of native coronary artery without angina pectoris: Secondary | ICD-10-CM | POA: Diagnosis not present

## 2023-04-13 DIAGNOSIS — Z955 Presence of coronary angioplasty implant and graft: Secondary | ICD-10-CM | POA: Diagnosis not present

## 2023-04-16 DIAGNOSIS — E1165 Type 2 diabetes mellitus with hyperglycemia: Secondary | ICD-10-CM | POA: Diagnosis not present

## 2023-05-03 DIAGNOSIS — M5416 Radiculopathy, lumbar region: Secondary | ICD-10-CM | POA: Diagnosis not present

## 2023-05-03 DIAGNOSIS — M48062 Spinal stenosis, lumbar region with neurogenic claudication: Secondary | ICD-10-CM | POA: Diagnosis not present

## 2023-05-03 DIAGNOSIS — M4807 Spinal stenosis, lumbosacral region: Secondary | ICD-10-CM | POA: Diagnosis not present

## 2023-05-16 DIAGNOSIS — E1165 Type 2 diabetes mellitus with hyperglycemia: Secondary | ICD-10-CM | POA: Diagnosis not present

## 2023-05-20 DIAGNOSIS — M48062 Spinal stenosis, lumbar region with neurogenic claudication: Secondary | ICD-10-CM | POA: Diagnosis not present

## 2023-05-20 DIAGNOSIS — M5416 Radiculopathy, lumbar region: Secondary | ICD-10-CM | POA: Diagnosis not present

## 2023-05-30 ENCOUNTER — Encounter: Payer: Self-pay | Admitting: Internal Medicine

## 2023-05-30 ENCOUNTER — Ambulatory Visit (INDEPENDENT_AMBULATORY_CARE_PROVIDER_SITE_OTHER): Payer: Medicare HMO | Admitting: Internal Medicine

## 2023-05-30 VITALS — BP 134/74 | HR 67 | Temp 100.0°F | Resp 18 | Ht 67.0 in | Wt 255.4 lb

## 2023-05-30 DIAGNOSIS — I480 Paroxysmal atrial fibrillation: Secondary | ICD-10-CM | POA: Diagnosis not present

## 2023-05-30 DIAGNOSIS — E890 Postprocedural hypothyroidism: Secondary | ICD-10-CM

## 2023-05-30 DIAGNOSIS — E782 Mixed hyperlipidemia: Secondary | ICD-10-CM | POA: Diagnosis not present

## 2023-05-30 DIAGNOSIS — K581 Irritable bowel syndrome with constipation: Secondary | ICD-10-CM

## 2023-05-30 DIAGNOSIS — Z85850 Personal history of malignant neoplasm of thyroid: Secondary | ICD-10-CM

## 2023-05-30 DIAGNOSIS — N183 Chronic kidney disease, stage 3 unspecified: Secondary | ICD-10-CM

## 2023-05-30 DIAGNOSIS — I2510 Atherosclerotic heart disease of native coronary artery without angina pectoris: Secondary | ICD-10-CM

## 2023-05-30 DIAGNOSIS — E1165 Type 2 diabetes mellitus with hyperglycemia: Secondary | ICD-10-CM | POA: Diagnosis not present

## 2023-05-30 DIAGNOSIS — E1142 Type 2 diabetes mellitus with diabetic polyneuropathy: Secondary | ICD-10-CM | POA: Diagnosis not present

## 2023-05-30 DIAGNOSIS — Z23 Encounter for immunization: Secondary | ICD-10-CM

## 2023-05-30 DIAGNOSIS — I10 Essential (primary) hypertension: Secondary | ICD-10-CM | POA: Diagnosis not present

## 2023-05-30 DIAGNOSIS — K219 Gastro-esophageal reflux disease without esophagitis: Secondary | ICD-10-CM

## 2023-05-30 DIAGNOSIS — Z7984 Long term (current) use of oral hypoglycemic drugs: Secondary | ICD-10-CM

## 2023-05-30 DIAGNOSIS — Z7985 Long-term (current) use of injectable non-insulin antidiabetic drugs: Secondary | ICD-10-CM

## 2023-05-30 DIAGNOSIS — D631 Anemia in chronic kidney disease: Secondary | ICD-10-CM

## 2023-05-30 NOTE — Progress Notes (Signed)
New Patient Office Visit  Subjective    Patient ID: Hailey Howard, female    DOB: 1956-03-11  Age: 67 y.o. MRN: [id number]  CC:  Chief Complaint  Patient presents with   Establish Care    Specialist:cardio, ortho, endo, podiatry, gastro    HPI Hailey Howard presents to establish care.   Hypertension/Atrial Fibrillation: -Medications: Amlodipine 5 mg, Telmisartan 80 mg, Spironolactone 50 mg, Sotalol 80 mg BID, Torsemide 10 mg -Patient is compliant with above medications and reports no side effects. -Denies any SOB, CP, vision changes, LE edema or symptoms of hypotension -Follows with Cardiology at [facility], last seen 04/13/23 -Echo 04/13/22 normal   HLD/CAD/Hx of CABG: -Medications: Lipitor 40 mg, aspirin -Patient is compliant with above medications and reports no side effects.  -Last lipid panel: uncertain  -Hx of CABG in 2002, PCI stent 2005 - she developed post-operative A.Fib after CABG in 2002  Diabetes, Type 2: -Last A1c 3/24 8.0%, 7.4% today -Medications: Metformin 1000 mg BID, Ozempic 0.50 mg (recent dose change), Lantus 35 units at night and Fiasp 12 units at breakfast, 16 units at lunch and 8 units at dinner and Gabapentin 300 mg once daily  -Follows with Endocrinology  -Patient is compliant with the above medications but reports occasional hypoglycemia episodes to 47 sometimes in the early mornings, nutritionist recently moved dinner time dose from 16 to 8 units  -Statin: yes -PNA vaccine: Due -Denies symptoms of polyuria, polydipsia, numbness extremities, foot ulcers/trauma.   Hx of Papillary Carcinoma of the Thyroid: -Currently on Levothyroxine 137 mcg -TSH 7/24 0.33 -Partial thyroidectomy in 2009 due to nodules, then total thyroidectomy in 2021 after cancer diagnosis   CKD3/AoCD: -Last creatinine 7/24 creatinine 1.6, GFR 35 -Follows with Nephrology  -Also follows with Heme/Onc, last seen 02/18/23 -Does take iron PO, had IV iron which did not go well   -Currently takes Vitamin D 1000 IU  GERD: -Currently on Prilosec 40 mg, symptoms well controlled  IBS: -Currently on Linzess 72 mcg taking every other day  Health Maintenance: -Blood work UTD -Mammogram 2/24, Birads-1 -Colonoscopy 2/23, repeat in 5 years -Prevnar 20 due  Outpatient Encounter Medications as of 05/30/2023  Medication Sig   amLODipine (NORVASC) 5 MG tablet Take 5 mg by mouth daily.    aspirin EC 81 MG tablet Take 81 mg by mouth once.   atorvastatin (LIPITOR) 40 MG tablet Take 1 tablet by mouth daily at 12 noon.   Cholecalciferol 25 MCG (1000 UT) tablet Take 1 tablet by mouth daily at 12 noon.   CONTOUR NEXT TEST test strip    dicyclomine (BENTYL) 20 MG tablet Take 20 mg by mouth 2 (two) times daily as needed for spasms.    FIASP 100 UNIT/ML SOLN Inject 5 Units into the skin in the morning, at noon, and at bedtime.   fluticasone (FLONASE) 50 MCG/ACT nasal spray Place 1 spray into both nostrils daily.   gabapentin (NEURONTIN) 300 MG capsule Take 300 mg by mouth daily.   insulin glargine (LANTUS SOLOSTAR) 100 UNIT/ML Solostar Pen Inject into the skin daily.   Iron-Vitamin C (VITRON-C) 65-125 MG TABS Take 1 tablet by mouth daily.   levothyroxine (SYNTHROID) 137 MCG tablet Take 137 mcg by mouth daily before breakfast.   linaclotide (LINZESS) 72 MCG capsule Take 72 mcg by mouth daily before breakfast.   Magnesium 250 MG TABS Take 250 mg by mouth daily.   metFORMIN (GLUCOPHAGE) 500 MG tablet Take 500-1,000 mg by mouth See admin instructions.  Take 1 tablet (500 mg) by mouth in the morning & take 2 tablets (1000 mg) by mouth at night.   Multiple Vitamin (MULTIVITAMIN WITH MINERALS) TABS tablet Take 1 tablet by mouth daily.   omeprazole (PRILOSEC) 40 MG capsule Take 40 mg by mouth daily.   Semaglutide (OZEMPIC, 0.25 OR 0.5 MG/DOSE, [place]) Inject into the skin.   sotalol (BETAPACE) 80 MG tablet Take 80 mg by mouth 2 (two) times daily.   spironolactone (ALDACTONE) 50 MG tablet Take  50 mg by mouth daily.    telmisartan (MICARDIS) 80 MG tablet Take 80 mg by mouth daily.   torsemide (DEMADEX) 10 MG tablet Take 10 mg by mouth daily.   [DISCONTINUED] furosemide (LASIX) 40 MG tablet Take 40 mg by mouth daily.   [DISCONTINUED] HYDROcodone-acetaminophen (NORCO/VICODIN) 5-325 MG tablet Take 1 tablet by mouth every 6 (six) hours as needed for moderate pain.   [DISCONTINUED] ciclopirox (LOPROX) 0.77 % cream Apply topically 2 (two) times daily.   [DISCONTINUED] docusate sodium (COLACE) 100 MG capsule Take 100 mg by mouth 2 (two) times daily.   No facility-administered encounter medications on file as of 05/30/2023.    Past Medical History:  Diagnosis Date   Anemia    iron anemia, now getting infusions   Anemia of chronic kidney failure, stage 3 (moderate) (HCC) 08/17/2019   Arthritis    Bradycardia    Cancer (HCC)    Cataract    CKD (chronic kidney disease), stage III (HCC)    Coronary artery disease    Coronary artery dissection 05/03/2001   LAD during diagnostic PCI at [facility]   Diabetes mellitus without complication (HCC)    DM type 2 with diabetic peripheral neuropathy (HCC) 09/19/2015   Dysphagia    Dysrhythmia    Exertional dyspnea    GERD (gastroesophageal reflux disease)    Headache    History of hiatal hernia    History of shingles    HLD (hyperlipidemia)    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   SVG-OM2, SVG-D2, LIMA-LAD   Hx of thyroid cancer    s/p thyroidectomy   Hypertension    IBS (irritable bowel syndrome)    Mesenteric mass    Peripheral vascular disease (HCC)    Right BBB/left ant fasc block    Rotator cuff tendinitis, left    Thyroid disease    Unspecified atrial fibrillation (HCC)     Past Surgical History:  Procedure Laterality Date   ABDOMINAL HYSTERECTOMY     CHOLECYSTECTOMY     COLONOSCOPY WITH PROPOFOL N/A 12/20/2016   Procedure: COLONOSCOPY WITH PROPOFOL;  Surgeon: Scot Jun, MD;  Location: [facility] ENDOSCOPY;  Service: Endoscopy;  Laterality:  N/A;   COLONOSCOPY WITH PROPOFOL N/A 01/19/2022   Procedure: COLONOSCOPY WITH PROPOFOL;  Surgeon: Regis Bill, MD;  Location: [facility] ENDOSCOPY;  Service: Endoscopy;  Laterality: N/A;   CORONARY ANGIOPLASTY     stents   CORONARY ARTERY BYPASS GRAFT  2002   3 vessels   ESOPHAGOGASTRODUODENOSCOPY N/A 03/15/2023   Procedure: ESOPHAGOGASTRODUODENOSCOPY (EGD);  Surgeon: Regis Bill, MD;  Location: [facility] ENDOSCOPY;  Service: Endoscopy;  Laterality: N/A;   ESOPHAGOGASTRODUODENOSCOPY (EGD) WITH PROPOFOL N/A 04/27/2017   Procedure: ESOPHAGOGASTRODUODENOSCOPY (EGD) WITH PROPOFOL;  Surgeon: Scot Jun, MD;  Location: [facility] ENDOSCOPY;  Service: Endoscopy;  Laterality: N/A;   EUS N/A 04/24/2015   Procedure: ESOPHAGEAL ENDOSCOPIC ULTRASOUND (EUS) RADIAL;  Surgeon: Wayland Salinas, MD;  Location: [facility] ENDOSCOPY;  Service: Endoscopy;  Laterality: N/A;   JOINT REPLACEMENT  THYROIDECTOMY, PARTIAL     TOTAL HIP ARTHROPLASTY Left 02/24/2021   Procedure: TOTAL HIP ARTHROPLASTY ANTERIOR APPROACH;  Surgeon: Kennedy Bucker, MD;  Location: [facility] ORS;  Service: Orthopedics;  Laterality: Left;    Family History  Problem Relation Age of Onset   Arthritis Sister    Heart disease Sister    Arthritis Sister    Arthritis Sister    Breast cancer Neg Hx     Social History   Socioeconomic History   Marital status: Married    Spouse name: Emmaline Kluver   Number of children: Not on file   Years of education: Not on file   Highest education level: Not on file  Occupational History   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 Continues to  work part time.   Insulin pump   Social Determinants of Health   Financial Resource Strain: Not on file  Food Insecurity: Not on file  Transportation Needs: Not on file  Physical Activity: Not on file  Stress: Not on file  Social Connections: Not on file  Intimate Partner Violence: Not on file    Review of Systems  Constitutional:  Positive for malaise/fatigue.  All other systems reviewed and are negative.       Objective    BP 134/74   Pulse 67   Temp 100 F (37.8 C)   Resp 18   Ht 5\' 7"  (1.702 m)   Wt 255 lb 6.4 oz (115.8 kg)   SpO2 98%   BMI 40.00 kg/m   Physical Exam Constitutional:      Appearance: Normal appearance.  HENT:     Head: Normocephalic and atraumatic.     Mouth/Throat:     Mouth: Mucous membranes are moist.     Pharynx: Oropharynx is clear.  Eyes:     Extraocular Movements: Extraocular movements intact.     Conjunctiva/sclera: Conjunctivae normal.     Pupils: Pupils are equal, round, and reactive to light.  Cardiovascular:     Rate and Rhythm: Normal rate and regular rhythm.  Pulmonary:     Effort: Pulmonary effort is normal.     Breath sounds: Normal breath sounds.  Musculoskeletal:     Cervical back: No tenderness.     Right lower leg: Edema present.     Left lower leg: Edema present.  Lymphadenopathy:     Cervical: No cervical adenopathy.  Skin:    General: Skin is warm and dry.  Neurological:     General: No focal deficit present.     Mental Status: She is alert. Mental status is at baseline.  Psychiatric:        Mood and Affect: Mood normal.        Behavior: Behavior normal.         Assessment & Plan:   1. Hypertension, unspecified type/Paroxysmal atrial fibrillation (HCC): Following with Cardiology at [facility], note reviewed from 04/13/23. Currently on Amlodipine 5 mg, Telmisartan 80 mg, Spironolactone 50 mg, Sotalol 80 mg BID, Torsemide 10 mg.   2. Mixed hyperlipidemia/Coronary artery disease involving native heart,  unspecified vessel or lesion type, unspecified whether angina present: Currently on Lipitor 40 mg, aspirin. Following with Cardiology.  3. DM type 2 with diabetic peripheral neuropathy (HCC): Following with Endocrinology, note reviewed from 02/14/23. Patient had labs today and A1c improved to 7.4%. Currently on Metformin 1000 mg BID, Ozempic 0.50 mg, Lantus 35 units at night and Fiasp 12 units at breakfast, 16 units at lunch. Recommend decreasing dinner dose down to 4 units. Also on Gabapentin 300 mg once daily.   4. History of thyroid cancer/Postoperative hypothyroidism: Again following with Endocrinology. Currently on Levothyroxine 137 mcg.   5. Anemia of chronic kidney failure, stage 3 (moderate) (HCC): Following with Nephrology and Hematology, considering EPO.  6. Gastroesophageal reflux disease, unspecified whether esophagitis present: Symptoms stable on Prilosec.  7. Irritable bowel syndrome with constipation: Currently on Linzess every other day.  8. Vaccine for streptococcus pneumoniae and influenza: Prevnar 20 administered today.   - Pneumococcal conjugate vaccine 20-valent (Prevnar 20)    Return in 6 months (on 11/30/2023) for 6 months medical, also needs AWV.   Margarita Mail, DO

## 2023-06-02 DIAGNOSIS — E1122 Type 2 diabetes mellitus with diabetic chronic kidney disease: Secondary | ICD-10-CM | POA: Diagnosis not present

## 2023-06-02 DIAGNOSIS — Z85850 Personal history of malignant neoplasm of thyroid: Secondary | ICD-10-CM | POA: Diagnosis not present

## 2023-06-02 DIAGNOSIS — E1169 Type 2 diabetes mellitus with other specified complication: Secondary | ICD-10-CM | POA: Diagnosis not present

## 2023-06-02 DIAGNOSIS — E890 Postprocedural hypothyroidism: Secondary | ICD-10-CM | POA: Diagnosis not present

## 2023-06-02 DIAGNOSIS — E113293 Type 2 diabetes mellitus with mild nonproliferative diabetic retinopathy without macular edema, bilateral: Secondary | ICD-10-CM | POA: Diagnosis not present

## 2023-06-02 DIAGNOSIS — E1129 Type 2 diabetes mellitus with other diabetic kidney complication: Secondary | ICD-10-CM | POA: Diagnosis not present

## 2023-06-02 DIAGNOSIS — E1165 Type 2 diabetes mellitus with hyperglycemia: Secondary | ICD-10-CM | POA: Diagnosis not present

## 2023-06-02 DIAGNOSIS — E1142 Type 2 diabetes mellitus with diabetic polyneuropathy: Secondary | ICD-10-CM | POA: Diagnosis not present

## 2023-06-02 DIAGNOSIS — E1159 Type 2 diabetes mellitus with other circulatory complications: Secondary | ICD-10-CM | POA: Diagnosis not present

## 2023-06-07 ENCOUNTER — Ambulatory Visit (INDEPENDENT_AMBULATORY_CARE_PROVIDER_SITE_OTHER): Payer: Medicare HMO | Admitting: Internal Medicine

## 2023-06-07 VITALS — BP 132/78 | HR 72 | Temp 98.3°F | Resp 16 | Ht 67.0 in | Wt 253.5 lb

## 2023-06-07 DIAGNOSIS — G8929 Other chronic pain: Secondary | ICD-10-CM | POA: Diagnosis not present

## 2023-06-07 DIAGNOSIS — Z9049 Acquired absence of other specified parts of digestive tract: Secondary | ICD-10-CM | POA: Diagnosis not present

## 2023-06-07 DIAGNOSIS — R1011 Right upper quadrant pain: Secondary | ICD-10-CM

## 2023-06-07 NOTE — Progress Notes (Signed)
Acute Office Visit  Subjective:     Patient ID: Hailey Howard, female    DOB: [birth year], 67 y.o.   MRN: [id number]  Chief Complaint  Patient presents with   Abdominal Pain    Right side, been going on for years, she states last dr. Tilda Franco her pain and she can't remember if she had any test run?    HPI Patient is in today for right side pain going on for years.  Patient states the pain is located in the right upper quadrant and will wrap around to her back.  It has been going on for multiple years and will occur nearly every day.  The pain is on and off and will last anywhere from a few seconds to a few minutes.  Pain is described as starting out sharp but can be a dull ache as well.  Does not associate any particular triggers, eating does not affect the pain.  Sometimes bending over makes the pain worse.  Putting pressure on the pain can help relieve it.  Usually has the pain at rest.  Pain is worsening in severity and increasing in frequency.  Denies any associated symptoms, no nausea, vomiting, changes in bowel movements, fevers or urinary symptoms.  She does have a history of cholecystectomy in 2009 due to gallstones.  She was diagnosed with costochondritis by her provider in the past with this similar complaint.   Review of Systems  Constitutional:  Negative for chills and fever.  Gastrointestinal:  Positive for abdominal pain. Negative for blood in stool, constipation, diarrhea, melena, nausea and vomiting.  Genitourinary:  Negative for dysuria, frequency, hematuria and urgency.        Objective:    BP 132/78   Pulse 72   Temp 98.3 F (36.8 C)   Resp 16   Ht 5\' 7"  (1.702 m)   Wt 253 lb 8 oz (115 kg)   SpO2 97%   BMI 39.70 kg/m  BP Readings from Last 3 Encounters:  06/07/23 132/78  05/30/23 134/74  03/15/23 134/68   Wt Readings from Last 3 Encounters:  06/07/23 253 lb 8 oz (115 kg)  05/30/23 255 lb 6.4 oz (115.8 kg)  03/15/23 258 lb (117 kg)       Physical Exam Constitutional:      Appearance: Normal appearance. She is well-developed.  HENT:     Head: Normocephalic and atraumatic.  Eyes:     Conjunctiva/sclera: Conjunctivae normal.  Cardiovascular:     Rate and Rhythm: Normal rate and regular rhythm.  Pulmonary:     Effort: Pulmonary effort is normal.     Breath sounds: Normal breath sounds.  Abdominal:     General: Bowel sounds are normal. There is no distension.     Palpations: Abdomen is soft.     Tenderness: There is abdominal tenderness. There is no right CVA tenderness, left CVA tenderness, guarding or rebound.     Comments: Upper quadrant pain to palpation with mild fullness noted in the right upper quadrant as well  Skin:    General: Skin is warm and dry.  Neurological:     General: No focal deficit present.     Mental Status: She is alert. Mental status is at baseline.  Psychiatric:        Mood and Affect: Mood normal.        Behavior: Behavior normal.     No results found for any visits on 06/07/23.      Assessment &  Plan:   1. Chronic RUQ pain/S/P cholecystectomy: Will obtain a CMP to evaluate liver function tests and ultrasound to evaluate liver.  Patient has had a cholecystectomy in 2009.  Thinking she may have scar tissue where she had her surgery which is affecting the pain.  - COMPLETE METABOLIC PANEL WITH GFR - US Abdomen Limited RUQ (LIVER/GB); Future   Return for already scheduled .  Margarita Mail, DO

## 2023-06-08 LAB — COMPLETE METABOLIC PANEL WITH GFR
AG Ratio: 1.5 (calc) (ref 1.0–2.5)
ALT: 11 U/L (ref 6–29)
AST: 14 U/L (ref 10–35)
Albumin: 4.1 g/dL (ref 3.6–5.1)
Alkaline phosphatase (APISO): 62 U/L (ref 37–153)
BUN/Creatinine Ratio: 17 (calc) (ref 6–22)
BUN: 34 mg/dL — ABNORMAL HIGH (ref 7–25)
CO2: 27 mmol/L (ref 20–32)
Calcium: 9 mg/dL (ref 8.6–10.4)
Chloride: 104 mmol/L (ref 98–110)
Creat: 1.98 mg/dL — ABNORMAL HIGH (ref 0.50–1.05)
Globulin: 2.8 g/dL (calc) (ref 1.9–3.7)
Glucose, Bld: 72 mg/dL (ref 65–99)
Potassium: 5.3 mmol/L (ref 3.5–5.3)
Sodium: 142 mmol/L (ref 135–146)
Total Bilirubin: 0.5 mg/dL (ref 0.2–1.2)
Total Protein: 6.9 g/dL (ref 6.1–8.1)
eGFR: 27 mL/min/{1.73_m2} — ABNORMAL LOW (ref >=60)

## 2023-06-15 ENCOUNTER — Ambulatory Visit
Admission: RE | Admit: 2023-06-15 | Discharge: 2023-06-15 | Disposition: A | Discharge status: Home / Self Care | Payer: Medicare HMO | Source: Ambulatory Visit | Attending: Internal Medicine | Admitting: Internal Medicine

## 2023-06-15 DIAGNOSIS — Z9049 Acquired absence of other specified parts of digestive tract: Secondary | ICD-10-CM | POA: Diagnosis not present

## 2023-06-15 DIAGNOSIS — G8929 Other chronic pain: Secondary | ICD-10-CM | POA: Diagnosis not present

## 2023-06-15 DIAGNOSIS — E1165 Type 2 diabetes mellitus with hyperglycemia: Secondary | ICD-10-CM | POA: Diagnosis not present

## 2023-06-15 DIAGNOSIS — R1011 Right upper quadrant pain: Secondary | ICD-10-CM | POA: Diagnosis not present

## 2023-06-24 ENCOUNTER — Encounter: Payer: Self-pay | Admitting: Internal Medicine

## 2023-06-28 ENCOUNTER — Other Ambulatory Visit: Payer: Self-pay | Admitting: Internal Medicine

## 2023-06-28 DIAGNOSIS — R499 Unspecified voice and resonance disorder: Secondary | ICD-10-CM

## 2023-06-29 DIAGNOSIS — H02885 Meibomian gland dysfunction left lower eyelid: Secondary | ICD-10-CM | POA: Diagnosis not present

## 2023-06-29 DIAGNOSIS — E113293 Type 2 diabetes mellitus with mild nonproliferative diabetic retinopathy without macular edema, bilateral: Secondary | ICD-10-CM | POA: Diagnosis not present

## 2023-06-29 DIAGNOSIS — H05243 Constant exophthalmos, bilateral: Secondary | ICD-10-CM | POA: Diagnosis not present

## 2023-06-29 DIAGNOSIS — H2513 Age-related nuclear cataract, bilateral: Secondary | ICD-10-CM | POA: Diagnosis not present

## 2023-06-29 DIAGNOSIS — H43811 Vitreous degeneration, right eye: Secondary | ICD-10-CM | POA: Diagnosis not present

## 2023-06-29 DIAGNOSIS — H43812 Vitreous degeneration, left eye: Secondary | ICD-10-CM | POA: Diagnosis not present

## 2023-06-29 DIAGNOSIS — H40033 Anatomical narrow angle, bilateral: Secondary | ICD-10-CM | POA: Diagnosis not present

## 2023-06-29 DIAGNOSIS — H43813 Vitreous degeneration, bilateral: Secondary | ICD-10-CM | POA: Diagnosis not present

## 2023-06-29 DIAGNOSIS — H02882 Meibomian gland dysfunction right lower eyelid: Secondary | ICD-10-CM | POA: Diagnosis not present

## 2023-07-13 NOTE — Progress Notes (Unsigned)
Referring Physician:  Evon Slack, PA-C [address] [place],  [place] [address]  Primary Physician:  Margarita Mail, DO  History of Present Illness: 07/14/2023 [name] is here today with a chief complaint of lower back, left buttocks, lateral hip and lateral thigh pain.  She has been having pain for 3 years.  She has left-sided buttock, lateral thigh, and groin pain.  This all began after she had a left hip arthroplasty in 2022.  She had low back pain for many years prior to that, but the pain that bothers her the most started after her hip replacement.  Prolonged standing and walking make it worse.  Walking up stairs is very difficult.  Nothing has really helped.  Injections have been unhelpful.  Physical therapy did not work.  She does not have any symptoms below the left knee.  There is intermittent tingling in her left lateral thigh.  Conservative measures:  Physical therapy:  has participated in at [facility] from 12/30/22 to 01/12/23 Multimodal medical therapy including regular antiinflammatories:  gabapentin, robaxin  Injections:  has received epidural steroid injections  05/20/2023: Bilateral L5-S1 transforaminal ESI 11/16/2018: Left L4-5 and L5-S1 transforaminal ESI (good relief) 01/19/2022: Left hip replacement  11/16/2018: Left L4-5 and left L5-S1 transforaminal ESI (good relief) 09/05/2018: Left S1 transforaminal ESI (mild relief) 07/14/2018: Left trochanteric bursa injection (no relief) 03/20/2018: Left trochanteric bursa injection (good relief x3 months used Hibiclens and no contrast) 02/23/2018: Left SI joint injection (little relief, used Hibiclens and no contrast) 01/05/2018: Left hip joint injection (little relief, used Hibiclens and no contrast) 07/18/2017: Left sacroiliac joint injection (little relief, used Hibiclens and no contrast)   Past Surgery: none  Hailey Howard has no symptoms of cervical myelopathy.  The symptoms are causing  a significant impact on the patient's life.   I have utilized the care everywhere function in epic to review the outside records available from external health systems.  Review of Systems:  A 10 point review of systems is negative, except for the pertinent positives and negatives detailed in the HPI.  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   Dysphagia    Dysrhythmia    Exertional dyspnea    GERD (gastroesophageal reflux disease)    Headache    History of hiatal hernia    History of shingles    HLD (hyperlipidemia)    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   SVG-OM2, SVG-D2, LIMA-LAD   Hx of thyroid cancer    s/p thyroidectomy   Hypertension    IBS (irritable bowel syndrome)    Mesenteric mass    Peripheral vascular disease (HCC)    Right BBB/left ant fasc block    Rotator cuff tendinitis, left    Thyroid disease    Unspecified atrial fibrillation (HCC)     Past Surgical History: Past Surgical History:  Procedure Laterality Date   ABDOMINAL HYSTERECTOMY     CHOLECYSTECTOMY     COLONOSCOPY  WITH PROPOFOL N/A 12/20/2016   Procedure: COLONOSCOPY WITH PROPOFOL;  Surgeon: Scot Jun, MD;  Location: [facility] ENDOSCOPY;  Service: Endoscopy;  Laterality: N/A;   COLONOSCOPY WITH PROPOFOL N/A 01/19/2022   Procedure: COLONOSCOPY WITH PROPOFOL;  Surgeon: Regis Bill, MD;  Location: [facility] ENDOSCOPY;  Service: Endoscopy;  Laterality: N/A;   CORONARY ANGIOPLASTY     stents   CORONARY ARTERY BYPASS GRAFT  2002   3 vessels    ESOPHAGOGASTRODUODENOSCOPY N/A 03/15/2023   Procedure: ESOPHAGOGASTRODUODENOSCOPY (EGD);  Surgeon: Regis Bill, MD;  Location: [facility] ENDOSCOPY;  Service: Endoscopy;  Laterality: N/A;   ESOPHAGOGASTRODUODENOSCOPY (EGD) WITH PROPOFOL N/A 04/27/2017   Procedure: ESOPHAGOGASTRODUODENOSCOPY (EGD) WITH PROPOFOL;  Surgeon: Scot Jun, MD;  Location: [facility] ENDOSCOPY;  Service: Endoscopy;  Laterality: N/A;   EUS N/A 04/24/2015   Procedure: ESOPHAGEAL ENDOSCOPIC ULTRASOUND (EUS) RADIAL;  Surgeon: Wayland Salinas, MD;  Location: [facility] ENDOSCOPY;  Service: Endoscopy;  Laterality: N/A;   JOINT REPLACEMENT     THYROIDECTOMY, PARTIAL     TOTAL HIP ARTHROPLASTY Left 02/24/2021   Procedure: TOTAL HIP ARTHROPLASTY ANTERIOR APPROACH;  Surgeon: Kennedy Bucker, MD;  Location: [facility] ORS;  Service: Orthopedics;  Laterality: Left;    Allergies: Allergies as of 07/14/2023 - Review Complete 07/14/2023  Allergen Reaction Noted   Other Anaphylaxis, Hives, and Itching 05/01/2014   Iodinated contrast media Hives 04/03/2015   Oxycodone-acetaminophen Hives 04/03/2015   Etodolac Itching 04/03/2015    Medications:  Current Outpatient Medications:    amLODipine (NORVASC) 5 MG tablet, Take 5 mg by mouth daily. , Disp: , Rfl:    aspirin EC 81 MG tablet, Take 81 mg by mouth once., Disp: , Rfl:    atorvastatin (LIPITOR) 40 MG tablet, Take 1 tablet by mouth daily at 12 noon., Disp: , Rfl:    Cholecalciferol 25 MCG (1000 UT) tablet, Take 1 tablet by mouth daily at 12 noon., Disp: , Rfl:    CONTOUR NEXT TEST test strip, , Disp: , Rfl:    dicyclomine (BENTYL) 20 MG tablet, Take 20 mg by mouth 2 (two) times daily as needed for spasms. , Disp: , Rfl:    FIASP 100 UNIT/ML SOLN, Inject 5 Units into the skin in the morning, at noon, and at bedtime., Disp: , Rfl:    fluticasone (FLONASE) 50 MCG/ACT nasal spray, Place 1 spray into both nostrils daily., Disp: , Rfl:    gabapentin (NEURONTIN) 300 MG capsule, Take 300 mg  by mouth daily., Disp: , Rfl:    insulin glargine (LANTUS SOLOSTAR) 100 UNIT/ML Solostar Pen, Inject into the skin daily., Disp: , Rfl:    Iron-Vitamin C (VITRON-C) 65-125 MG TABS, Take 1 tablet by mouth daily., Disp: , Rfl:    levothyroxine (SYNTHROID) 137 MCG tablet, Take 137 mcg by mouth daily before breakfast., Disp: , Rfl:    linaclotide (LINZESS) 72 MCG capsule, Take 72 mcg by mouth daily before breakfast., Disp: , Rfl:    Magnesium 250 MG TABS, Take 250 mg by mouth daily., Disp: , Rfl:    metFORMIN (GLUCOPHAGE) 500 MG tablet, Take 500-1,000 mg by mouth See admin instructions. Take 1 tablet (500 mg) by mouth in the morning & take 2 tablets (1000 mg) by mouth at night., Disp: , Rfl:    Multiple Vitamin (MULTIVITAMIN WITH MINERALS) TABS tablet, Take 1 tablet by mouth daily., Disp: , Rfl:    omeprazole (PRILOSEC) 40 MG capsule, Take 40 mg by mouth daily., Disp: , Rfl:    Semaglutide (OZEMPIC, 0.25  OR 0.5 MG/DOSE, [place]), Inject into the skin., Disp: , Rfl:    sotalol (BETAPACE) 80 MG tablet, Take 80 mg by mouth 2 (two) times daily., Disp: , Rfl:    spironolactone (ALDACTONE) 50 MG tablet, Take 50 mg by mouth daily. , Disp: , Rfl:    telmisartan (MICARDIS) 80 MG tablet, Take 80 mg by mouth daily., Disp: , Rfl:    torsemide (DEMADEX) 10 MG tablet, Take 10 mg by mouth daily., Disp: , Rfl:   Social History: Social History   Tobacco Use   Smoking status: Never   Smokeless tobacco: Never  Vaping Use   Vaping status: Never Used  Substance Use Topics   Alcohol use: No   Drug use: No    Family Medical History: Family History  Problem Relation Age of Onset   Arthritis Sister    Heart disease Sister    Arthritis Sister    Arthritis Sister    Breast cancer Neg Hx     Physical Examination: Vitals:   07/14/23 1257  BP: 130/72    General: Patient is in no apparent distress. Attention to examination is appropriate.  Neck:   Supple.  Full range of motion.  Respiratory: Patient is  breathing without any difficulty.   NEUROLOGICAL:     Awake, alert, oriented to person, place, and time.  Speech is clear and fluent.   Cranial Nerves: Pupils equal round and reactive to light.  Facial tone is symmetric.  Facial sensation is symmetric. Shoulder shrug is symmetric. Tongue protrusion is midline.  There is no pronator drift.  Strength: Side Biceps Triceps Deltoid Interossei Grip Wrist Ext. Wrist Flex.  R 5 5 5 5 5 5 5   L 5 5 5 5 5 5 5    Side Iliopsoas Quads Hamstring PF DF EHL  R 5 5 5 5 5 5   L 5 5 5 5 5 5    Reflexes are 1+ and symmetric at the biceps, triceps, brachioradialis, patella and achilles.   Hoffman's is absent.   Bilateral upper and lower extremity sensation is intact to light touch.    No evidence of dysmetria noted.  Gait is normal.     Medical Decision Making  Imaging: MRI L spine 03/07/2023 IMPRESSION: 1. Mild lumbar spine spondylosis as described above without significant interval change compared with the prior exam. 2. No acute osseous injury of the lumbar spine.     Electronically Signed   By: Elige Ko M.D.   On: 03/09/2023 13:37  I have personally reviewed the images and agree with the above interpretation.  Assessment and Plan: Ms. Suleman is a pleasant 67 y.o. female with mild lumbar spondylosis without substantial compression.  She has symptoms that all began after her left hip arthroplasty.  Though she has not done better with physical therapy and injections, I do not think she has a good chance of improvement with any surgical intervention on her lower back.  I do not think she should have back surgery.  The synovial cyst at L4-5 is clinically insignificant.  She may qualify for spinal cord stimulator evaluation.  I have given her information to consider this, as she has been dealing with diabetic neuropathy as well as chronic back pain for many years.  She will let us know if she would like referrals to be evaluated.    Thank  you for involving me in the care of this patient.      Noelle Hoogland K. Myer Haff MD, [facility] Neurosurgery

## 2023-07-14 ENCOUNTER — Encounter: Payer: Self-pay | Admitting: Neurosurgery

## 2023-07-14 ENCOUNTER — Ambulatory Visit: Payer: Medicare HMO | Admitting: Neurosurgery

## 2023-07-14 VITALS — BP 130/72 | Ht 67.0 in | Wt 252.6 lb

## 2023-07-14 DIAGNOSIS — M4306 Spondylolysis, lumbar region: Secondary | ICD-10-CM | POA: Diagnosis not present

## 2023-07-14 DIAGNOSIS — M79605 Pain in left leg: Secondary | ICD-10-CM

## 2023-07-14 DIAGNOSIS — M25552 Pain in left hip: Secondary | ICD-10-CM

## 2023-07-14 NOTE — Progress Notes (Signed)
   Acute Office Visit  Subjective:     Patient ID: Hailey Howard, female    DOB: June 04, 1956, 67 y.o.   MRN: [id number]  Chief Complaint  Patient presents with   Rash    In groin area w/ tenderness    HPI Patient is in today for rash.  Patient states for the last few weeks she has noticed tenderness and redness in the skin folds of the groin.  She was seen for this issue previously at a different office and was prescribed hydrocortisone cream, ketoconazole cream and Neosporin.  She states these have helped somewhat but she is still having tenderness and inflammation.  She also has several skin tags on her legs and under her breasts that she would like to discuss getting removed.   Review of Systems  Skin:  Positive for rash. Negative for itching.        Objective:    BP 130/64   Pulse 78   Temp 97.9 F (36.6 C)   Ht 5\' 7"  (1.702 m)   Wt 251 lb 14.4 oz (114.3 kg)   SpO2 99%   BMI 39.45 kg/m  BP Readings from Last 3 Encounters:  07/15/23 130/64  07/14/23 130/72  06/07/23 132/78   Wt Readings from Last 3 Encounters:  07/15/23 251 lb 14.4 oz (114.3 kg)  07/14/23 252 lb 9.6 oz (114.6 kg)  06/07/23 253 lb 8 oz (115 kg)      Physical Exam Constitutional:      Appearance: Normal appearance.  HENT:     Head: Normocephalic and atraumatic.  Eyes:     Conjunctiva/sclera: Conjunctivae normal.  Cardiovascular:     Rate and Rhythm: Normal rate and regular rhythm.  Pulmonary:     Effort: Pulmonary effort is normal.     Breath sounds: Normal breath sounds.  Skin:    General: Skin is warm and dry.     Findings: Rash present.     Comments: Fungal rash in the folds of the skin in the groin.  Multiple skin tags on legs, groin and under breasts as well as neck.  Neurological:     General: No focal deficit present.     Mental Status: She is alert. Mental status is at baseline.  Psychiatric:        Mood and Affect: Mood normal.        Behavior: Behavior normal.     No  results found for any visits on 07/15/23.      Assessment & Plan:   1. Intertrigo: Stop ointments, treat with nystatin powder and oral Diflucan.  Counseled patient on letting skin air dry after baths and showers and wearing loosefitting breathable clothing.  Patient will follow-up as symptoms worsen or fail to improve.  - nystatin (MYCOSTATIN/NYSTOP) powder; Apply 1 Application topically 3 (three) times daily.  Dispense: 15 g; Refill: 0 - fluconazole (DIFLUCAN) 150 MG tablet; Take 1 tablet (150 mg total) by mouth once for 1 dose.  Dispense: 3 tablet; Refill: 0  2. Multiple acquired skin tags: Referral placed to dermatology for multiple skin tag removal.  - Ambulatory referral to Dermatology  Return if symptoms worsen or fail to improve, for AWV.  Margarita Mail, DO

## 2023-07-15 ENCOUNTER — Telehealth: Payer: Self-pay | Admitting: Internal Medicine

## 2023-07-15 ENCOUNTER — Ambulatory Visit (INDEPENDENT_AMBULATORY_CARE_PROVIDER_SITE_OTHER): Payer: Medicare HMO | Admitting: Internal Medicine

## 2023-07-15 ENCOUNTER — Encounter: Payer: Self-pay | Admitting: Internal Medicine

## 2023-07-15 VITALS — BP 130/64 | HR 78 | Temp 97.9°F | Ht 67.0 in | Wt 251.9 lb

## 2023-07-15 DIAGNOSIS — L304 Erythema intertrigo: Secondary | ICD-10-CM | POA: Diagnosis not present

## 2023-07-15 DIAGNOSIS — L918 Other hypertrophic disorders of the skin: Secondary | ICD-10-CM | POA: Diagnosis not present

## 2023-07-15 DIAGNOSIS — E1165 Type 2 diabetes mellitus with hyperglycemia: Secondary | ICD-10-CM | POA: Diagnosis not present

## 2023-07-15 MED ORDER — FLUCONAZOLE 150 MG PO TABS: 150.0000 mg | ORAL_TABLET | Freq: Once | ORAL | 0 refills | Status: AC

## 2023-07-15 MED ORDER — NYSTATIN 100000 UNIT/GM EX POWD
1.0000 | Freq: Three times a day (TID) | CUTANEOUS | 0 refills | Status: DC
Start: 2023-07-15 — End: 2023-08-08

## 2023-07-15 NOTE — Telephone Encounter (Signed)
Please contact pt. We have her sch'd for her annual wellness for next yr but she informed dr Caralee Ates that she normally have them in the fall.

## 2023-07-15 NOTE — Patient Instructions (Signed)
Intertrigo Intertrigo is skin irritation (inflammation) that happens in warm, moist areas of the body. The irritation can cause a rash and make skin raw and itchy. The rash is usually pink or red. It happens mostly between folds of skin or where skin rubs together, such as: In the armpits. Under the breasts. Under the belly. In the groin area. Around the butt area. Between the toes. This condition is not passed from person to person. What are the causes? Heat, moisture, rubbing, and not enough air movement. The condition can be made worse by: Sweat. Bacteria. A fungus, such as yeast. What increases the risk? Moisture in your skin folds. You are more likely to develop this condition if you: Are not able to move around. Live in a warm and moist climate. Are not able to control your pee (urine) or poop (stool). Wear splints, braces, or other medical devices. Are overweight. Have diabetes. What are the signs or symptoms? A pink or red skin rash in a skin fold or near a skin fold. Raw or scaly skin. Itching. A burning feeling. Bleeding. Leaking fluid. A bad smell. How is this treated? Cleaning and drying your skin. Taking an antibiotic medicine or using an antibiotic skin cream for a bacterial infection. Using an antifungal cream on your skin or taking pills for an infection that was caused by a fungus, such as yeast. Using a steroid ointment to stop the itching and irritation. Separating the skin fold with a clean cotton cloth to absorb moisture and allow air to flow into the area. Follow these instructions at home: Keep the affected area clean and dry. Do not scratch your skin. Stay cool as much as you can. Use an air conditioner or a fan, if you have one. Apply over-the-counter and prescription medicines only as told by your doctor. If you were prescribed antibiotics, use them as told by your doctor. Do not stop using the antibiotic even if you start to feel better. Keep all  follow-up visits. Your doctor may need to check your skin to make sure that the treatment is working. How is this prevented? Shower and dry yourself well after being active. Use a hair dryer on a cool setting to dry between skin folds. Do not wear tight clothes. Wear clothes that: Are loose. Take moisture away from your body. Are made of cotton. Wear a bra that gives good support, if needed. Protect the skin in your groin and butt area as told by your doctor. To do this: Follow a regular cleaning routine. Use creams, powders, or ointments that protect your skin. Change protection pads often. Stay at a healthy weight. Take care of your feet. This is very important if you have diabetes. You should: Wear shoes that fit well. Keep your feet dry. Wear clean cotton or wool socks. Keep your blood sugar under control if you have diabetes. Contact a doctor if: Your symptoms do not get better with treatment. Your symptoms get worse or they spread. You notice more redness and warmth. You have a fever. This information is not intended to replace advice given to you by your health care provider. Make sure you discuss any questions you have with your health care provider. Document Revised: 04/01/2022 Document Reviewed: 04/01/2022 Elsevier Patient Education  2024 [employer]  

## 2023-07-18 ENCOUNTER — Telehealth: Payer: Self-pay | Admitting: Neurosurgery

## 2023-07-18 DIAGNOSIS — M79605 Pain in left leg: Secondary | ICD-10-CM

## 2023-07-18 NOTE — Telephone Encounter (Signed)
Left message on her voice mail about her referrals.

## 2023-07-18 NOTE — Telephone Encounter (Signed)
Patient is calling that she wants to move forward with the [facility]. Please put in the necessary referrals.

## 2023-07-18 NOTE — Telephone Encounter (Signed)
All referrals have been placed. Please let patient know.

## 2023-07-23 ENCOUNTER — Encounter: Payer: Self-pay | Admitting: Internal Medicine

## 2023-07-26 ENCOUNTER — Ambulatory Visit
Payer: Medicare HMO | Attending: Student in an Organized Health Care Education/Training Program | Admitting: Student in an Organized Health Care Education/Training Program

## 2023-07-26 ENCOUNTER — Ambulatory Visit
Admission: RE | Admit: 2023-07-26 | Discharge: 2023-07-26 | Disposition: A | Discharge status: Home / Self Care | Payer: Medicare HMO | Source: Ambulatory Visit | Attending: Student in an Organized Health Care Education/Training Program | Admitting: Student in an Organized Health Care Education/Training Program

## 2023-07-26 ENCOUNTER — Encounter: Payer: Self-pay | Admitting: Student in an Organized Health Care Education/Training Program

## 2023-07-26 VITALS — BP 122/90 | HR 65 | Temp 97.1°F | Resp 18 | Ht 67.0 in | Wt 250.0 lb

## 2023-07-26 DIAGNOSIS — M48062 Spinal stenosis, lumbar region with neurogenic claudication: Secondary | ICD-10-CM

## 2023-07-26 DIAGNOSIS — G8929 Other chronic pain: Secondary | ICD-10-CM | POA: Diagnosis not present

## 2023-07-26 DIAGNOSIS — M5416 Radiculopathy, lumbar region: Secondary | ICD-10-CM

## 2023-07-26 DIAGNOSIS — G894 Chronic pain syndrome: Secondary | ICD-10-CM | POA: Diagnosis not present

## 2023-07-26 DIAGNOSIS — E1140 Type 2 diabetes mellitus with diabetic neuropathy, unspecified: Secondary | ICD-10-CM | POA: Diagnosis not present

## 2023-07-26 NOTE — Progress Notes (Signed)
Safety precautions to be maintained throughout the outpatient stay will include: orient to surroundings, keep bed in low position, maintain call bell within reach at all times, provide assistance with transfer out of bed and ambulation.  

## 2023-07-26 NOTE — Patient Instructions (Signed)
Preparing for Procedure with Sedation Instructions: . Oral Intake: Do not eat or drink anything for at least 8 hours prior to your procedure. . Transportation: Public transportation is not allowed. Bring an adult driver. The driver must be physically present in our waiting room before any procedure can be started. . Physical Assistance: Bring an adult capable of physically assisting you, in the event you need help. . Blood Pressure Medicine: Take your blood pressure medicine with a sip of water the morning of the procedure. . Insulin: Take only  of your normal insulin dose. . Preventing infections: Shower with an antibacterial soap the morning of your procedure. . Build-up your immune system: Take 1000 mg of Vitamin C with every meal (3 times a day) the day prior to your procedure. . Pregnancy: If you are pregnant, call and cancel the procedure. . Sickness: If you have a cold, fever, or any active infections, call and cancel the procedure. . Arrival: You must be in the facility at least 30 minutes prior to your scheduled procedure. . Children: Do not bring children with you. . Dress appropriately: Bring dark clothing that you would not mind if they get stained. . Valuables: Do not bring any jewelry or valuables. Procedure appointments are reserved for interventional treatments only. . No Prescription Refills. . No medication changes will be discussed during procedure appointments. . No disability issues will be discussed. .  

## 2023-07-26 NOTE — Progress Notes (Signed)
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 Extreme obesity (Class III) Increased incidence by 254%   Patient's current BMI Ideal Body weight  Body mass index is 39.16 kg/m. Ideal body weight: 61.6 kg (135 lb 12.9 oz) Adjusted ideal body weight: 82.3 kg (181 lb 7.7 oz)   BMI Readings from Last 4 Encounters:  07/26/23 39.16 kg/m  07/15/23 39.45 kg/m  07/14/23 39.56 kg/m  06/07/23 39.70 kg/m   Wt Readings from Last 4 Encounters:  07/26/23 250 lb (113.4 kg)  07/15/23 251 lb 14.4 oz (114.3 kg)  07/14/23 252 lb 9.6 oz (114.6 kg)  06/07/23 253 lb 8 oz (115 kg)    Psych/Mental status: Alert, oriented x 3 (person, place, & time)       Eyes: PERLA Respiratory: No evidence of acute respiratory distress  Thoracic Spine Area Exam  Skin &  Axial Inspection: No masses, redness, or swelling Alignment: Symmetrical Functional ROM: Unrestricted ROM Stability: No instability detected Muscle Tone/Strength: Functionally intact. No obvious neuro-muscular anomalies detected. Sensory (Neurological): Unimpaired Muscle strength & Tone: No palpable anomalies Lumbar Spine Area Exam  Skin & Axial Inspection: No masses, redness, or swelling Alignment: Symmetrical Functional ROM: Pain restricted ROM affecting both sides Stability: No instability detected Muscle Tone/Strength: Functionally intact. No obvious neuro-muscular anomalies detected. Sensory (Neurological): Neurogenic pain pattern  Ambulation: Unassisted Gait: Antalgic gait (limping) Posture: Difficulty standing  up straight, due to pain  Lower Extremity Exam    Side: Right lower extremity  Side: Left lower extremity  Stability: No instability observed          Stability: No instability observed          Skin & Extremity Inspection: Skin color, temperature, and hair growth are WNL. No peripheral edema or cyanosis. No masses, redness, swelling, asymmetry, or associated skin lesions. No contractures.  Skin & Extremity Inspection: Skin color, temperature, and hair growth are WNL. No peripheral edema or cyanosis. No masses, redness, swelling, asymmetry, or associated skin lesions. No contractures.  Functional ROM: Pain restricted ROM for hip and knee joints          Functional ROM: Pain restricted ROM for hip and knee joints          Muscle Tone/Strength: Functionally intact. No obvious neuro-muscular anomalies detected.  Muscle Tone/Strength: Functionally intact. No obvious neuro-muscular anomalies detected.  Sensory (Neurological): Neurogenic pain pattern        Sensory (Neurological): Neurogenic pain pattern        DTR: Patellar: deferred today Achilles: deferred today Plantar: deferred today  DTR: Patellar: deferred today Achilles: deferred today Plantar: deferred today   Palpation: No palpable anomalies  Palpation: No palpable anomalies    Assessment  Primary Diagnosis & Pertinent Problem List: The primary encounter diagnosis was Chronic radicular lumbar pain. Diagnoses of Lumbar radiculopathy, Spinal stenosis, lumbar region, with neurogenic claudication, Chronic painful diabetic neuropathy (HCC), and Chronic pain syndrome were also pertinent to this visit.  Visit Diagnosis (New problems to examiner): 1. Chronic radicular lumbar pain   2. Lumbar radiculopathy   3. Spinal stenosis, lumbar region, with neurogenic claudication   4. Chronic painful diabetic neuropathy (HCC)   5. Chronic pain syndrome    Plan of Care (Initial workup plan)  I discussed  percutaneous spinal cord stimulator trial with the patient in detail. I explained to the patient that they will have an external power source and programmer which the patient will use for 7 days. There will be daily communication with the stimulator company and the patient. A possible need for a mid-trial clinic visit to give the patient the best chance of success.   Patient will need to have a thorough psychosocial behavioral evaluation. Our office will be happy to help facilitate this. Will place referral to [facility]  I was also able to evaluate her interlaminar spaces under live fluoroscopy and they appear patent for percutaneous access for SCS trial.  Some of patient's pain does seem to be mechanical in nature, with some component of neurogenic pain as well. We discussed the indications for spinal cord stimulation, specifically stating that it is typically better for neuropathic and appendicular pain, but that we have had some success in the treatment of low back and hip pain.   Patient is interested in proceeding with spinal cord stimulation trial. He understands that this may not be successful, and that spinal cord stimulation in general is not a "magic bullet."   We had a lengthy and very detailed  discussion of all the risks, benefits, alternatives, and rationale of surgery as well as the option of continuing nonsurgical therapies. We specifically discussed the risks of temporary or permanent worsened neurologic injury, no symptomatic relief or pain made worse after procedure, and also the need for future surgery (due to infection, CSF leak, bleeding, adjacent segment issues, bone-healing difficulties, and other related issues). No guarantees of outcome were made or implied  and he is eager to proceed and presents for definitive treatment.  Patient old me that all of her questions were answered thoroughly and to hersatisfaction. Confidence and understanding of the discussed risks and consequences of  treatment was expressed and she accepted these risks and was eager to proceed with procedure.   Issues concerning treatment and diagnosis were discussed with the patient. There are no barriers to understanding the plan of treatment. Explanation was well received by patient and/or family who then verbalized understanding.    Imaging Orders         DG PAIN CLINIC C-ARM 1-60 MIN NO REPORT    Referral Orders         Ambulatory referral to Psychology     Procedure Orders         [facility] TRIAL     Provider-requested follow-up: Return in about 13 days (around 08/08/2023) for [employer] trial, ECT.  Future Appointments  Date Time Provider Department Center  07/29/2023  9:00 AM [facility]  09/02/2023  1:15 PM [facility] LAB [facility] None  09/02/2023  1:30 PM Earna Coder, MD [facility] None  09/02/2023  2:00 PM [facility] INFUSION CHAIR 2 [facility] None  11/30/2023 10:00 AM Margarita Mail, DO [facility]  02/02/2024 11:15 AM [facility]-ANNUAL WELLNESS VISIT [facility]    Duration of encounter: .  Total time on encounter, as per AMA guidelines included both the face-to-face and non-face-to-face time personally spent by the physician and/or other qualified health care  professional(s) on the day of the encounter (includes time in activities that require the physician or other qualified health care professional and does not include time in activities normally performed by clinical staff). Physician's time may include the following activities when performed: Preparing to see the patient (e.g., pre-charting review of records, searching for previously ordered imaging, lab work, and nerve conduction tests) Review of prior analgesic pharmacotherapies. Reviewing PMP Interpreting ordered tests (e.g., lab work, imaging, nerve conduction tests) Performing post-procedure evaluations, including interpretation of diagnostic procedures Obtaining and/or reviewing separately obtained history Performing a medically appropriate examination and/or evaluation Counseling and educating the patient/family/caregiver Ordering medications, tests, or procedures Referring and communicating with other health care professionals (when not separately reported) Documenting clinical information in the electronic or other health record Independently interpreting results (not separately reported) and communicating results to the patient/ family/caregiver Care coordination (not separately reported)  Note by: Edward Jolly, MD (TTS technology used. I apologize for any typographical errors that were not detected and corrected.) Date: 07/26/2023; Time: 11:13 AM

## 2023-07-29 ENCOUNTER — Ambulatory Visit
Admission: RE | Admit: 2023-07-29 | Discharge: 2023-07-29 | Disposition: A | Discharge status: Home / Self Care | Payer: Medicare HMO | Source: Ambulatory Visit | Attending: Neurosurgery | Admitting: Neurosurgery

## 2023-07-29 DIAGNOSIS — M546 Pain in thoracic spine: Secondary | ICD-10-CM | POA: Diagnosis not present

## 2023-07-29 DIAGNOSIS — M79605 Pain in left leg: Secondary | ICD-10-CM | POA: Diagnosis not present

## 2023-07-29 DIAGNOSIS — M545 Low back pain, unspecified: Secondary | ICD-10-CM | POA: Diagnosis not present

## 2023-08-02 ENCOUNTER — Telehealth: Payer: Self-pay | Admitting: Student in an Organized Health Care Education/Training Program

## 2023-08-02 ENCOUNTER — Encounter: Payer: Medicare HMO | Admitting: Speech Pathology

## 2023-08-02 NOTE — Telephone Encounter (Signed)
This patient needs the number to [facility] please call her with it

## 2023-08-02 NOTE — Telephone Encounter (Signed)
Patient given phone numbers for Baxter Hire and Enrique Sack.

## 2023-08-03 DIAGNOSIS — I10 Essential (primary) hypertension: Secondary | ICD-10-CM | POA: Diagnosis not present

## 2023-08-03 DIAGNOSIS — E1169 Type 2 diabetes mellitus with other specified complication: Secondary | ICD-10-CM | POA: Diagnosis not present

## 2023-08-03 DIAGNOSIS — E875 Hyperkalemia: Secondary | ICD-10-CM | POA: Diagnosis not present

## 2023-08-03 DIAGNOSIS — E669 Obesity, unspecified: Secondary | ICD-10-CM | POA: Diagnosis not present

## 2023-08-04 ENCOUNTER — Encounter: Payer: Self-pay | Admitting: Internal Medicine

## 2023-08-07 NOTE — Progress Notes (Unsigned)
Acute Office Visit  Subjective:     Patient ID: Hailey Howard, female    DOB: 28-Sep-1956, 67 y.o.   MRN: [id number]  No chief complaint on file.   HPI Patient is in today for concern for yeast infection.  ROS      Objective:    There were no vitals taken for this visit. {Vitals History (Optional):23777}  Physical Exam  No results found for any visits on 08/08/23.      Assessment & Plan:   Problem List Items Addressed This Visit   None   No orders of the defined types were placed in this encounter.   No follow-ups on file.  Margarita Mail, DO

## 2023-08-08 ENCOUNTER — Ambulatory Visit (INDEPENDENT_AMBULATORY_CARE_PROVIDER_SITE_OTHER): Payer: Medicare HMO | Admitting: Internal Medicine

## 2023-08-08 ENCOUNTER — Encounter: Payer: Self-pay | Admitting: Internal Medicine

## 2023-08-08 VITALS — BP 130/60 | HR 76 | Temp 98.1°F | Ht 67.0 in | Wt 250.2 lb

## 2023-08-08 DIAGNOSIS — L304 Erythema intertrigo: Secondary | ICD-10-CM

## 2023-08-08 MED ORDER — NYSTATIN 100000 UNIT/GM EX POWD
1.0000 | Freq: Three times a day (TID) | CUTANEOUS | 0 refills | Status: DC
Start: 2023-08-08 — End: 2023-11-30

## 2023-08-08 MED ORDER — FLUCONAZOLE 150 MG PO TABS
150.0000 mg | ORAL_TABLET | ORAL | 0 refills | Status: AC
Start: 2023-08-08 — End: 2023-09-13

## 2023-08-09 ENCOUNTER — Encounter: Payer: Medicare HMO | Admitting: Speech Pathology

## 2023-08-10 DIAGNOSIS — N1832 Chronic kidney disease, stage 3b: Secondary | ICD-10-CM | POA: Diagnosis not present

## 2023-08-10 DIAGNOSIS — I10 Essential (primary) hypertension: Secondary | ICD-10-CM | POA: Diagnosis not present

## 2023-08-10 DIAGNOSIS — E875 Hyperkalemia: Secondary | ICD-10-CM | POA: Diagnosis not present

## 2023-08-10 LAB — MICROALBUMIN / CREATININE URINE RATIO: Microalb Creat Ratio: 7

## 2023-08-14 DIAGNOSIS — E1165 Type 2 diabetes mellitus with hyperglycemia: Secondary | ICD-10-CM | POA: Diagnosis not present

## 2023-08-16 ENCOUNTER — Encounter: Payer: Medicare HMO | Admitting: Speech Pathology

## 2023-08-17 ENCOUNTER — Encounter: Payer: Self-pay | Admitting: Internal Medicine

## 2023-08-22 DIAGNOSIS — E113293 Type 2 diabetes mellitus with mild nonproliferative diabetic retinopathy without macular edema, bilateral: Secondary | ICD-10-CM | POA: Diagnosis not present

## 2023-08-22 DIAGNOSIS — H02882 Meibomian gland dysfunction right lower eyelid: Secondary | ICD-10-CM | POA: Diagnosis not present

## 2023-08-22 DIAGNOSIS — H02885 Meibomian gland dysfunction left lower eyelid: Secondary | ICD-10-CM | POA: Diagnosis not present

## 2023-08-22 DIAGNOSIS — H43813 Vitreous degeneration, bilateral: Secondary | ICD-10-CM | POA: Diagnosis not present

## 2023-08-22 DIAGNOSIS — H40033 Anatomical narrow angle, bilateral: Secondary | ICD-10-CM | POA: Diagnosis not present

## 2023-08-22 DIAGNOSIS — H2513 Age-related nuclear cataract, bilateral: Secondary | ICD-10-CM | POA: Diagnosis not present

## 2023-08-22 DIAGNOSIS — H5203 Hypermetropia, bilateral: Secondary | ICD-10-CM | POA: Diagnosis not present

## 2023-08-22 DIAGNOSIS — H52223 Regular astigmatism, bilateral: Secondary | ICD-10-CM | POA: Diagnosis not present

## 2023-08-22 DIAGNOSIS — H05243 Constant exophthalmos, bilateral: Secondary | ICD-10-CM | POA: Diagnosis not present

## 2023-08-22 LAB — HM DIABETES EYE EXAM

## 2023-08-23 ENCOUNTER — Encounter: Payer: Medicare HMO | Admitting: Speech Pathology

## 2023-08-29 ENCOUNTER — Ambulatory Visit: Payer: Medicare HMO | Admitting: Student in an Organized Health Care Education/Training Program

## 2023-08-29 DIAGNOSIS — E1165 Type 2 diabetes mellitus with hyperglycemia: Secondary | ICD-10-CM | POA: Diagnosis not present

## 2023-08-29 LAB — HEMOGLOBIN A1C: Hemoglobin A1C: 6.7

## 2023-08-30 ENCOUNTER — Encounter: Payer: Medicare HMO | Admitting: Speech Pathology

## 2023-09-01 ENCOUNTER — Other Ambulatory Visit: Payer: Self-pay | Admitting: *Deleted

## 2023-09-01 DIAGNOSIS — N183 Chronic kidney disease, stage 3 unspecified: Secondary | ICD-10-CM

## 2023-09-02 ENCOUNTER — Inpatient Hospital Stay: Payer: Medicare HMO | Attending: Internal Medicine

## 2023-09-02 ENCOUNTER — Ambulatory Visit: Payer: Medicare HMO

## 2023-09-02 ENCOUNTER — Inpatient Hospital Stay: Payer: Medicare HMO | Admitting: Nurse Practitioner

## 2023-09-02 ENCOUNTER — Encounter: Payer: Self-pay | Admitting: Nurse Practitioner

## 2023-09-02 VITALS — BP 132/49 | HR 64 | Temp 97.5°F | Resp 14 | Wt 250.0 lb

## 2023-09-02 DIAGNOSIS — D472 Monoclonal gammopathy: Secondary | ICD-10-CM

## 2023-09-02 DIAGNOSIS — D631 Anemia in chronic kidney disease: Secondary | ICD-10-CM | POA: Diagnosis not present

## 2023-09-02 DIAGNOSIS — N183 Chronic kidney disease, stage 3 unspecified: Secondary | ICD-10-CM

## 2023-09-02 DIAGNOSIS — Z85850 Personal history of malignant neoplasm of thyroid: Secondary | ICD-10-CM | POA: Insufficient documentation

## 2023-09-02 LAB — CBC WITH DIFFERENTIAL (CANCER CENTER ONLY)
Abs Immature Granulocytes: 0.04 10*3/uL (ref 0.00–0.07)
Basophils Absolute: 0 10*3/uL (ref 0.0–0.1)
Basophils Relative: 1 %
Eosinophils Absolute: 0.2 10*3/uL (ref 0.0–0.5)
Eosinophils Relative: 2 %
HCT: 33.5 % — ABNORMAL LOW (ref 36.0–46.0)
Hemoglobin: 10.4 g/dL — ABNORMAL LOW (ref 12.0–15.0)
Immature Granulocytes: 1 %
Lymphocytes Relative: 26 %
Lymphs Abs: 2.2 10*3/uL (ref 0.7–4.0)
MCH: 27.7 pg (ref 26.0–34.0)
MCHC: 31 g/dL (ref 30.0–36.0)
MCV: 89.3 fL (ref 80.0–100.0)
Monocytes Absolute: 0.5 10*3/uL (ref 0.1–1.0)
Monocytes Relative: 6 %
Neutro Abs: 5.3 10*3/uL (ref 1.7–7.7)
Neutrophils Relative %: 64 %
Platelet Count: 313 10*3/uL (ref 150–400)
RBC: 3.75 MIL/uL — ABNORMAL LOW (ref 3.87–5.11)
RDW: 13.7 % (ref 11.5–15.5)
WBC Count: 8.2 10*3/uL (ref 4.0–10.5)
nRBC: 0 % (ref 0.0–0.2)

## 2023-09-02 LAB — BASIC METABOLIC PANEL - CANCER CENTER ONLY
Anion gap: 7 (ref 5–15)
BUN: 22 mg/dL (ref 8–23)
CO2: 25 mmol/L (ref 22–32)
Calcium: 8.7 mg/dL — ABNORMAL LOW (ref 8.9–10.3)
Chloride: 107 mmol/L (ref 98–111)
Creatinine: 1.52 mg/dL — ABNORMAL HIGH (ref 0.44–1.00)
GFR, Estimated: 37 mL/min — ABNORMAL LOW (ref >60)
Glucose, Bld: 129 mg/dL — ABNORMAL HIGH (ref 70–99)
Potassium: 4.3 mmol/L (ref 3.5–5.1)
Sodium: 139 mmol/L (ref 135–145)

## 2023-09-02 LAB — IRON AND TIBC
Iron: 47 ug/dL (ref 28–170)
Saturation Ratios: 17 % (ref 10.4–31.8)
TIBC: 284 ug/dL (ref 250–450)
UIBC: 237 ug/dL

## 2023-09-02 LAB — FERRITIN: Ferritin: 86 ng/mL (ref 11–307)

## 2023-09-02 NOTE — Progress Notes (Signed)
[facility] OFFICE PROGRESS NOTE  Patient Care Team: Margarita Mail, DO as PCP - General (Internal Medicine) Earna Coder, MD as Consulting Physician (Hematology and Oncology)  Virtual Visit Progress Note  I connected with Manfred Shirts on 09/02/23 at  1:30 PM EDT by video enabled telemedicine visit and verified that I am speaking with the correct person using two identifiers.   I discussed the limitations, risks, security and privacy concerns of performing an evaluation and management service by telemedicine and the availability of in-person appointments. I also discussed with the patient that there may be a patient responsible charge related to this service. The patient expressed understanding and agreed to proceed.   Other persons participating in the visit and their role in the encounter: none   Patient's location: clinic  Provider's location: home   Oncology History Overview Note   SUMMARY OF ONCOLOGIC HISTORY: #October 2019-MGUS IgA kappa 0.  2 g/dL [Dr.Balch; elevated ESR]; June 2020 repeat M protein-negative  #Anemia hemoglobin 9.9/CKD stage III  #  Anterior mesenteric nodule- 16x80mm [sep 2016; smaller; Feb 2016- S/p Bx- leiomyoma.]; Ilecolic LN [82mm; sep 2016; improving].2016- Octreotide scan- NEG;  CT SEP 2017- NED [elevated chromogranin  Levels- slightly]; July 2019- CT STABLE; Oct 2019- [facility]- imaging reviewed; No further surveillaince  # CKD stage III/diabetes; hemoglobin 10 [EGD-2016; colo-2018-Dr.Elliot]  #April 2021-total thyroidectomy; [Dr.Cannon] Tumor Size: 0.8 cm in greatest dimension Histologic Type: Papillary carcinoma, classic (usual, conventional); stage I; no adjuvant therapy  # Retro-tracheal duplication cyst   Thyroid cancer (HCC)  03/28/2020 Initial Diagnosis   Thyroid cancer (HCC)     INTERVAL HISTORY: Ambulating independently.  Alone.   Patient is 67 year old female with above history of iron deficiency related to her  kidney disease who returns to clinic for follow up. She continues oral iron and is tolerating well. She continues to have poor iv access and prefers to avoid repeat blood draws or iv iron if possible. Her fatigue is stable and unchanged. Shortness of breath is stable. Her abdominal pain d/t IBS is stable.    Review of Systems  Constitutional:  Positive for malaise/fatigue. Negative for chills, diaphoresis, fever and weight loss.  HENT:  Negative for nosebleeds and sore throat.   Eyes:  Negative for double vision.  Respiratory:  Negative for cough, hemoptysis, sputum production, shortness of breath and wheezing.   Cardiovascular:  Negative for chest pain, palpitations, orthopnea and leg swelling.  Gastrointestinal:  Positive for constipation and diarrhea. Negative for abdominal pain, blood in stool, heartburn, melena, nausea and vomiting.  Genitourinary:  Negative for dysuria, frequency and urgency.  Musculoskeletal:  Positive for back pain and joint pain.  Skin: Negative.  Negative for itching and rash.  Neurological:  Negative for dizziness, tingling, focal weakness, weakness and headaches.  Endo/Heme/Allergies:  Does not bruise/bleed easily.  Psychiatric/Behavioral:  Negative for depression. The patient is not nervous/anxious and does not have insomnia.     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   Dysphagia    Dysrhythmia    Exertional dyspnea    GERD (gastroesophageal reflux disease)    Headache    History of hiatal  hernia    History of shingles    HLD (hyperlipidemia)    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   SVG-OM2, SVG-D2, LIMA-LAD   Hx of thyroid cancer    s/p thyroidectomy   Hypertension    IBS (irritable bowel syndrome)    Mesenteric mass    Peripheral vascular disease (HCC)    Right BBB/left ant fasc block    Rotator cuff tendinitis, left    Thyroid disease    Unspecified atrial fibrillation (HCC)     PAST SURGICAL HISTORY :   Past Surgical History:  Procedure Laterality Date   ABDOMINAL HYSTERECTOMY     CHOLECYSTECTOMY     COLONOSCOPY WITH PROPOFOL N/A 12/20/2016   Procedure: COLONOSCOPY WITH PROPOFOL;  Surgeon: Scot Jun, MD;  Location: [facility] ENDOSCOPY;  Service: Endoscopy;  Laterality: N/A;   COLONOSCOPY WITH PROPOFOL N/A 01/19/2022   Procedure: COLONOSCOPY WITH PROPOFOL;  Surgeon: Regis Bill, MD;  Location: [facility] ENDOSCOPY;  Service: Endoscopy;  Laterality: N/A;   CORONARY ANGIOPLASTY     stents   CORONARY ARTERY BYPASS GRAFT  2002   3 vessels   ESOPHAGOGASTRODUODENOSCOPY N/A 03/15/2023   Procedure: ESOPHAGOGASTRODUODENOSCOPY (EGD);  Surgeon: Regis Bill, MD;  Location: [facility] ENDOSCOPY;  Service: Endoscopy;  Laterality: N/A;   ESOPHAGOGASTRODUODENOSCOPY (EGD) WITH PROPOFOL N/A 04/27/2017   Procedure: ESOPHAGOGASTRODUODENOSCOPY (EGD) WITH PROPOFOL;  Surgeon: Scot Jun, MD;  Location: [facility] ENDOSCOPY;  Service: Endoscopy;  Laterality: N/A;   EUS N/A 04/24/2015   Procedure: ESOPHAGEAL ENDOSCOPIC ULTRASOUND (EUS) RADIAL;  Surgeon: Wayland Salinas, MD;  Location: [facility] ENDOSCOPY;  Service: Endoscopy;  Laterality: N/A;   JOINT REPLACEMENT     THYROIDECTOMY, PARTIAL     TOTAL HIP ARTHROPLASTY Left 02/24/2021   Procedure: TOTAL HIP ARTHROPLASTY ANTERIOR APPROACH;  Surgeon: Kennedy Bucker, MD;  Location: [facility] ORS;  Service: Orthopedics;  Laterality: Left;    FAMILY HISTORY :   Family History  Problem Relation Age of Onset   Arthritis Sister    Heart disease  Sister    Arthritis Sister    Arthritis Sister    Breast cancer Neg Hx     SOCIAL HISTORY:   Social History   Tobacco Use   Smoking status: Never   Smokeless tobacco: Never  Vaping Use   Vaping status: Never Used  Substance Use Topics   Alcohol use: No   Drug use: No    ALLERGIES:  is allergic to other, iodinated contrast media, oxycodone-acetaminophen, and etodolac.  MEDICATIONS:  Current Outpatient Medications  Medication Sig Dispense Refill   amLODipine (NORVASC) 5 MG tablet Take 5 mg by mouth daily.      aspirin EC 81 MG tablet Take 81 mg by mouth once.     atorvastatin (LIPITOR) 40 MG tablet Take 1 tablet by mouth daily at 12 noon.     Cholecalciferol 25 MCG (1000 UT) tablet Take 1 tablet by mouth daily at 12 noon.     dicyclomine (BENTYL) 20 MG tablet Take 20 mg by mouth 2 (two) times daily as needed for spasms.      FIASP 100 UNIT/ML SOLN Inject 5 Units into the skin in the morning, at noon, and at bedtime.     fluconazole (DIFLUCAN) 150 MG tablet Take 1 tablet (150 mg total)  by mouth once a week for 6 doses. 6 tablet 0   fluticasone (FLONASE) 50 MCG/ACT nasal spray Place 1 spray into both nostrils daily.     gabapentin (NEURONTIN) 300 MG capsule Take 300 mg by mouth daily.     insulin glargine (LANTUS SOLOSTAR) 100 UNIT/ML Solostar Pen Inject into the skin daily.     Iron-Vitamin C (VITRON-C) 65-125 MG TABS Take 1 tablet by mouth daily.     levothyroxine (SYNTHROID) 137 MCG tablet Take 137 mcg by mouth daily before breakfast.     linaclotide (LINZESS) 72 MCG capsule Take 72 mcg by mouth daily before breakfast.     Magnesium 250 MG TABS Take 250 mg by mouth daily.     metFORMIN (GLUCOPHAGE) 500 MG tablet Take 500-1,000 mg by mouth See admin instructions. Take 1 tablet (500 mg) by mouth in the morning & take 2 tablets (1000 mg) by mouth at night.     Multiple Vitamin (MULTIVITAMIN WITH MINERALS) TABS tablet Take 1 tablet by mouth daily.     nystatin (MYCOSTATIN/NYSTOP)  powder Apply 1 Application topically 3 (three) times daily. 15 g 0   omeprazole (PRILOSEC) 40 MG capsule Take 40 mg by mouth daily.     Semaglutide (OZEMPIC, 0.25 OR 0.5 MG/DOSE, [place]) Inject into the skin.     sotalol (BETAPACE) 80 MG tablet Take 80 mg by mouth 2 (two) times daily.     spironolactone (ALDACTONE) 50 MG tablet Take 50 mg by mouth daily.      telmisartan (MICARDIS) 80 MG tablet Take 80 mg by mouth daily.     torsemide (DEMADEX) 10 MG tablet Take 10 mg by mouth daily.     No current facility-administered medications for this visit.    PHYSICAL EXAMINATION: ECOG PERFORMANCE STATUS: 1 - Symptomatic but completely ambulatory BP (!) 132/49 (BP Location: Left Arm, Patient Position: Sitting)   Pulse 64   Temp (!) 97.5 F (36.4 C)   Resp 14   Wt 250 lb (113.4 kg)   SpO2 100%   BMI 39.16 kg/m   Filed Weights   09/02/23 1327  Weight: 250 lb (113.4 kg)   Physical Exam Constitutional:      Comments: Patient is alone.  HENT:     Head: Normocephalic and atraumatic.  Pulmonary:     Effort: No respiratory distress.  Neurological:     Mental Status: She is alert and oriented to person, place, and time.  Psychiatric:        Mood and Affect: Mood and affect normal.        Behavior: Behavior normal.     LABORATORY DATA:  I have reviewed the data as listed    Component Value Date/Time   NA 139 09/02/2023 1307   NA 136 03/10/2015 1640   K 4.3 09/02/2023 1307   K 2.7 (L) 03/10/2015 1640   CL 107 09/02/2023 1307   CL 96 (L) 03/10/2015 1640   CO2 25 09/02/2023 1307   CO2 31 03/10/2015 1640   GLUCOSE 129 (H) 09/02/2023 1307   GLUCOSE 99 03/10/2015 1640   BUN 22 09/02/2023 1307   BUN 16 03/10/2015 1640   CREATININE 1.52 (H) 09/02/2023 1307   CREATININE 1.98 (H) 06/07/2023 1123   CALCIUM 8.7 (L) 09/02/2023 1307   CALCIUM 9.0 03/10/2015 1640   PROT 6.9 06/07/2023 1123   PROT 7.7 03/10/2015 1640   ALBUMIN 4.0 05/29/2021 1015   ALBUMIN 4.1 03/10/2015 1640   AST 14  06/07/2023 1123  AST 34 03/10/2015 1640   ALT 11 06/07/2023 1123   ALT 39 03/10/2015 1640   ALKPHOS 67 05/29/2021 1015   ALKPHOS 60 03/10/2015 1640   BILITOT 0.5 06/07/2023 1123   BILITOT 0.4 03/10/2015 1640   GFRNONAA 37 (L) 09/02/2023 1307   GFRNONAA 50 (L) 03/10/2015 1640   GFRAA 39 (L) 07/25/2020 1252   GFRAA 58 (L) 03/10/2015 1640   Lab Results  Component Value Date   WBC 8.2 09/02/2023   NEUTROABS 5.3 09/02/2023   HGB 10.4 (L) 09/02/2023   HCT 33.5 (L) 09/02/2023   MCV 89.3 09/02/2023   PLT 313 09/02/2023   Iron/TIBC/Ferritin/ %Sat    Component Value Date/Time   IRON 47 09/02/2023 1306   TIBC 284 09/02/2023 1306   FERRITIN 86 09/02/2023 1306   IRONPCTSAT 17 09/02/2023 1306     ASSESSMENT & PLAN:   Anemia of chronic kidney failure, stage 3 (moderate) (HCC) # Anemia-hemoglobin-hemoglobin 11.9/secondary to CKD-iron deficiency. Hemoglobin today 10.4, ferritin and iron studies pending at time of visit. Ferritin- 86, iron sat 17%. Hold venofer. Continue vitron-c, 1 tablet daily.   # CKD stage III [GFR 32] no NSAIDs. On Lasix q OD per [facility] nephrology]. GFR 37 today.    # S/p thyroidectomy thyroid Tumor Size: 0.8 cm in greatest dimension Histologic Type: Papillary carcinoma, classic (usual, conventional).  No role for any adjuvant therapy; followed by endocrinology for surveillance.   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 Dr Bernie Covey elevated ESR. June 2020 repeat m protein negative. Plan to check annually. Low risk for transformation. No role for bone marrow.    # IV access: Poor/hydration. Prefers to have labs drawn with PCP when possible. Can also consider labs to be performed at [facility] draw station.    DISPOSITION:  Hold venofer 6 mo- labs (cbc, cmp, ferritin, iron studies, SPEP, K/L light chains), Dr Donneta Romberg- la  I discussed the assessment and treatment plan with the patient. The patient was provided an opportunity to ask questions and all were answered. The patient agreed  with the plan and demonstrated an understanding of the instructions.   The patient was advised to call back or seek an in-person evaluation if the symptoms worsen or if the condition fails to improve as anticipated.   I spent 20 minutes face-to-face video visit time dedicated to the care of this patient on the date of this encounter to include pre-visit review of labs, heme notes, face-to-face time with the patient, and post visit ordering of testing/documentation.    Alinda Dooms, NP 09/02/2023

## 2023-09-02 NOTE — Progress Notes (Signed)
Pt in for follow up, states she is doing well other than continued left hip pain.

## 2023-09-05 ENCOUNTER — Encounter: Payer: Self-pay | Admitting: Student in an Organized Health Care Education/Training Program

## 2023-09-05 ENCOUNTER — Ambulatory Visit
Admission: RE | Admit: 2023-09-05 | Discharge: 2023-09-05 | Disposition: A | Discharge status: Home / Self Care | Payer: Medicare HMO | Source: Ambulatory Visit | Attending: Student in an Organized Health Care Education/Training Program | Admitting: Student in an Organized Health Care Education/Training Program

## 2023-09-05 ENCOUNTER — Ambulatory Visit
Payer: Medicare HMO | Attending: Student in an Organized Health Care Education/Training Program | Admitting: Student in an Organized Health Care Education/Training Program

## 2023-09-05 DIAGNOSIS — E1140 Type 2 diabetes mellitus with diabetic neuropathy, unspecified: Secondary | ICD-10-CM

## 2023-09-05 DIAGNOSIS — G894 Chronic pain syndrome: Secondary | ICD-10-CM | POA: Diagnosis not present

## 2023-09-05 DIAGNOSIS — G8929 Other chronic pain: Secondary | ICD-10-CM

## 2023-09-05 DIAGNOSIS — M5416 Radiculopathy, lumbar region: Secondary | ICD-10-CM | POA: Diagnosis not present

## 2023-09-05 MED ORDER — MIDAZOLAM HCL 5 MG/5ML IJ SOLN
INTRAMUSCULAR | Status: AC
  Filled 2023-09-05: qty 5

## 2023-09-05 MED ORDER — LIDOCAINE HCL 2 % IJ SOLN
20.0000 mL | Freq: Once | INTRAMUSCULAR | Status: AC
  Administered 2023-09-05: 400 mg

## 2023-09-05 MED ORDER — CEFAZOLIN SODIUM-DEXTROSE 2-4 GM/100ML-% IV SOLN
2.0000 g | Freq: Once | INTRAVENOUS | Status: AC
  Administered 2023-09-05: 2 g via INTRAVENOUS

## 2023-09-05 MED ORDER — ROPIVACAINE HCL 2 MG/ML IJ SOLN
20.0000 mL | Freq: Once | INTRAMUSCULAR | Status: AC
  Administered 2023-09-05: 20 mL

## 2023-09-05 MED ORDER — FENTANYL CITRATE (PF) 100 MCG/2ML IJ SOLN
25.0000 ug | INTRAMUSCULAR | Status: DC | PRN
  Administered 2023-09-05: 50 ug via INTRAVENOUS

## 2023-09-05 MED ORDER — CEPHALEXIN 500 MG PO CAPS: 500.0000 mg | ORAL_CAPSULE | Freq: Four times a day (QID) | ORAL | 0 refills | Status: AC

## 2023-09-05 MED ORDER — LIDOCAINE HCL 2 % IJ SOLN
INTRAMUSCULAR | Status: AC
  Filled 2023-09-05: qty 20

## 2023-09-05 MED ORDER — LACTATED RINGERS IV SOLN: Freq: Once | INTRAVENOUS | Status: AC

## 2023-09-05 MED ORDER — FENTANYL CITRATE (PF) 100 MCG/2ML IJ SOLN
INTRAMUSCULAR | Status: AC
  Filled 2023-09-05: qty 2

## 2023-09-05 MED ORDER — MIDAZOLAM HCL 5 MG/5ML IJ SOLN
0.5000 mg | Freq: Once | INTRAMUSCULAR | Status: AC
  Administered 2023-09-05: 1 mg via INTRAVENOUS

## 2023-09-05 MED ORDER — CEFAZOLIN SODIUM 1 G IJ SOLR
INTRAMUSCULAR | Status: AC
  Filled 2023-09-05: qty 20

## 2023-09-05 MED ORDER — ROPIVACAINE HCL 2 MG/ML IJ SOLN
INTRAMUSCULAR | Status: AC
  Filled 2023-09-05: qty 20

## 2023-09-05 NOTE — Progress Notes (Signed)
Safety precautions to be maintained throughout the outpatient stay will include: orient to surroundings, keep bed in low position, maintain call bell within reach at all times, provide assistance with transfer out of bed and ambulation.  

## 2023-09-05 NOTE — Progress Notes (Signed)
PROVIDER NOTE: Interpretation of information contained herein should be left to medically-trained personnel. Specific patient instructions are provided elsewhere under "Patient Instructions" section of medical record. This document was created in part using STT-dictation technology, any transcriptional errors that may result from this process are unintentional.  Patient: Hailey Howard Type: Established DOB: 16-Apr-1956 MRN: [id number] PCP: Margarita Mail, DO  Service: Procedure DOS: 09/05/2023 Setting: Ambulatory Location: Ambulatory outpatient facility Delivery: Face-to-face Provider: Edward Jolly, MD Specialty: Interventional Pain Management Specialty designation: 09 Location: Outpatient facility Ref. Prov.: Edward Jolly, MD       Interventional Therapy   Primary Reason for Admission: Surgical management of chronic pain condition.   Procedure:              Type: [employer] Trial Spinal Cord Neurostimulator Implant (Percutaneous, interlaminar, posterior epidural placement) Laterality: Bilateral (-50)  Level: Lumbar  Imaging: Fluoroscopic guidance Anesthesia: Local anesthesia (1-2% Lidocaine) Sedation: Moderate Sedation                       DOS: 09/05/2023  Performed by: Edward Jolly, MD  Purpose: Diagnostic. To determine if a permanent implant may be effective in controlling some or all of Hailey Howard's chronic pain symptoms.  Rationale (medical necessity): procedure needed and proper for the diagnosis and/or treatment of Hailey Howard's medical symptoms and needs. 1. Chronic radicular lumbar pain   2. Lumbar radiculopathy   3. Chronic painful diabetic neuropathy (HCC)   4. Chronic pain syndrome    NAS-11 Pain score:   Pre-procedure: 5 /10   Post-procedure: 0-No pain/10     Target: Posterior epidural space over the dorsal columns of the spinal cord. Location: Posterior intraspinal canal Region: Thoracolumbar  Approach: Translaminar percutaneous  Type of  procedure: Surgical   Position / Prep / Materials:  Position: Prone  Prep solution: DuraPrep (Iodine Povacrylex [0.7% available iodine] and Isopropyl Alcohol, 74% w/w) Prep Area: Entire  Posterior  Thoracic  Region  Materials:  Tray: Implant tray Needle(s):  Type: Epidural  Gauge (G):  14    H&P (Pre-op Assessment):  Hailey Howard is a 67 y.o. (year old), female patient, seen today for interventional treatment. 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  Initial Vital Signs:  Pulse/EKG Rate: 66ECG Heart Rate: 64 Temp: (!) 97.2 F (36.2 C) Resp: 16 BP: (!) 147/69 SpO2: 100 %  BMI: Estimated body mass index is 39.16 kg/m as calculated from the following:   Height as of this encounter: 5\' 7"  (1.702 m).   Weight as of this encounter: 250 lb (113.4 kg).  Risk Assessment: Allergies: Reviewed. She is allergic to other, iodinated contrast media, oxycodone-acetaminophen, and etodolac.  Allergy Precautions: None required Coagulopathies: Reviewed. None identified.  Blood-thinner therapy: None at this time Active Infection(s): Reviewed. None identified. Hailey Howard is afebrile  Site Confirmation: Hailey Howard was asked to confirm the procedure and laterality before marking the site, which she did. Procedure checklist: Completed Consent: Before the procedure and under the influence of no sedative(s), amnesic(s), or anxiolytics, the patient was informed of the treatment options, risks and possible complications. To fulfill our ethical and legal obligations, as recommended by the  American Medical Association's Code of Ethics, I have informed the patient  of my clinical impression; the nature and purpose of the treatment or procedure; the risks, benefits, and possible complications of the intervention; the alternatives, including doing nothing; the risk(s) and benefit(s) of the alternative treatment(s) or procedure(s); and the risk(s) and benefit(s) of doing nothing.  Hailey Howard was provided with information about the general risks and possible complications associated with most interventional procedures. These include, but are not limited to: failure to achieve desired goals, infection, bleeding, organ or nerve damage, allergic reactions, paralysis, and/or death.  In addition, she was informed of those risks and possible complications associated to this particular procedure, which include, but are not limited to: damage to the implant; failure to decrease pain; local, systemic, or serious CNS infections, intraspinal abscess with possible cord compression and paralysis, or life-threatening such as meningitis; intrathecal and/or epidural bleeding with formation of hematoma with possible spinal cord compression and permanent paralysis; organ damage; nerve injury or damage with subsequent sensory, motor, and/or autonomic system dysfunction, resulting in transient or permanent pain, numbness, and/or weakness of one or several areas of the body; allergic reactions, either minor or major life-threatening, such as anaphylactic or anaphylactoid reactions.  Furthermore, Hailey Howard was informed of those risks and complications associated with the medications. These include, but are not limited to: allergic reactions (i.e.: anaphylactic or anaphylactoid reactions); arrhythmia;  Hypotension/hypertension; cardiovascular collapse; respiratory depression and/or shortness of breath; swelling or edema; medication-induced neural toxicity; particulate matter embolism and blood vessel occlusion with  resultant organ, and/or nervous system infarction and permanent paralysis.  Finally, she was informed that Medicine is not an exact science; therefore, there is also the possibility of unforeseen or unpredictable risks and/or possible complications that may result in a catastrophic outcome. The patient indicated having understood very clearly. We have given the patient no guarantees and we have made no promises. Enough time was given to the patient to ask questions, all of which were answered to the patient's satisfaction. Ms. Bishop has indicated that she wanted to continue with the procedure. Attestation: I, the ordering provider, attest that I have discussed with the patient the benefits, risks, side-effects, alternatives, likelihood of achieving goals, and potential problems during recovery for the procedure that I have provided informed consent. Date  Time: 09/05/2023  7:51 AM  Pre-Procedure Preparation:  Monitoring: As per clinic protocol. Respiration, ETCO2, SpO2, BP, heart rate and rhythm monitor placed and checked for adequate function Safety Precautions: Patient was assessed for positional comfort and pressure points before starting the procedure. Time-out: I initiated and conducted the "Time-out" before starting the procedure, as per protocol. The patient was asked to participate by confirming the accuracy of the "Time Out" information. Verification of the correct person, site, and procedure were performed and confirmed by me, the nursing staff, and the patient. "Time-out" conducted as per Joint Commission's Universal Protocol (UP.01.01.01). Time: 0844 Start Time: 0845 hrs.  Description/Narrative of Procedure:          Rationale (medical necessity): procedure needed and proper for the diagnosis and/or treatment of the patient's medical symptoms and needs. Procedural Technique Safety Precautions: Aspiration looking for blood return was conducted prior to all injections. At no point did we  inject any substances, as a needle was being advanced. No attempts were made at seeking any paresthesias. Safe injection practices and needle disposal techniques used. Medications properly checked for expiration dates. SDV (single dose vial) medications used. Description of the Procedure: Protocol guidelines were followed. The patient was assisted into a comfortable position. The target  area was identified and the area prepped in the usual manner. Skin & deeper tissues infiltrated with local anesthetic. Appropriate amount of time allowed to pass for local anesthetics to take effect. The procedure needles were then advanced to the target area. Proper needle placement secured. Negative aspiration confirmed. Solution injected in intermittent fashion, asking for systemic symptoms every 0.5cc of injectate. The needles were then removed and the area cleansed, making sure to leave some of the prepping solution back to take advantage of its long term bactericidal properties.  Technical description of procedure: Availability of a responsible, adult driver, and NPO status confirmed. Informed consent was obtained after having discussed risks and possible complications. An IV was started. The patient was then taken to the fluoroscopy suite, where the patient was placed in position for the procedure, over the fluoroscopy table. The patient was then monitored in the usual manner. Fluoroscopy was manipulated to obtain the best possible view of the target. Parallex error was corrected before commencing the procedure. Once a clear view of the target had been obtained, the skin and deeper tissues over the procedure site were infiltrated using lidocaine, loaded in a 10 cc luer-loc syringe with a 0.5 inch, 25-G needle. The introducer needle(s) was/were then inserted through the skin and deeper tissues. A paramidline approach was used to enter the posterior epidural space at a 30 angle, using "Loss-of-resistance Technique" with 3  ml of PF-NaCl (0.9% NSS). Correct needle placement was confirmed in the antero-posterior and lateral fluoroscopic views. The lead was gently introduced and manipulated under real-time fluoroscopy, constantly assessing for pain, discomfort, or paresthesias, until the tip rested at the desired level. Both sides were done in identical fashion. Electrode placement was tested until appropriate coverage was attained. Once the patient confirmed that the stimulation was over the desired area, the lead(s) was/were secured in place and the introducer needles removed. This was done under real-time fluoroscopy while observing the electrode tip to avoid unintended migration. The area was covered with a non-occlusive dressing and the patient transported to recovery for further programming.  Vitals:   09/05/23 0925 09/05/23 0931 09/05/23 0941 09/05/23 0953  BP: (!) 140/89 132/68 138/68 (!) 124/59  Pulse:      Resp: 19 14 13 16   Temp:  (!) 97.2 F (36.2 C)  (!) 97.1 F (36.2 C)  TempSrc:  Temporal  Temporal  SpO2: 100% 100% 100% 100%  Weight:      Height:        Start Time: 0845 hrs. End Time: 0923 hrs.  Neurostimulator Details:   Lead(s):  Brand: [employer]         Epidural Access Level:  L1-2 L1-2  Lead implant:  Bilateral   No. of Electrodes/Lead:  16 16  Laterality:  Left Right  Top electrode location:  T7 T7  Bottom electrode location:  T9 T9  Model No.: [id number] E Same  Length: 50cm Same  Lot No.: [id number]  MRI compatibility:  Yes Yes   Imaging Guidance (Spinal):          Type of Imaging Technique: Fluoroscopy Guidance (Spinal) Indication(s): Assistance in needle guidance and placement for procedures requiring needle placement in or near specific anatomical locations not easily accessible without such assistance. Exposure Time: Please see nurses notes. Contrast: None used. Fluoroscopic Guidance: I was personally present during the use of fluoroscopy. "Tunnel Vision  Technique" used to obtain the best possible view of the target area. Parallax error corrected before commencing the procedure. "Direction-depth-direction" technique  used to introduce the needle under continuous pulsed fluoroscopy. Once target was reached, antero-posterior, oblique, and lateral fluoroscopic projection used confirm needle placement in all planes. Images permanently stored in EMR. Interpretation: No contrast injected. I personally interpreted the imaging intraoperatively. Adequate needle placement confirmed in multiple planes. Permanent images saved into the patient's record.      Antibiotic Prophylaxis:   Anti-infectives (From admission, onward)    Start     Dose/Rate Route Frequency Ordered Stop   09/05/23 0845  ceFAZolin (ANCEF) IVPB 2g/100 mL premix        2 g 200 mL/hr over 30 Minutes Intravenous  Once 09/05/23 0840 09/05/23 0912   09/05/23 0000  cephALEXin (KEFLEX) 500 MG capsule        500 mg Oral 4 times daily 09/05/23 0817 09/12/23 2359      Indication(s): Implant Prophylaxis.  Post-operative Assessment:  Post-procedure Vital Signs:  Pulse/HCG Rate: 6660 Temp: (!) 97.1 F (36.2 C) Resp: 16 BP: (!) 124/59 SpO2: 100 %  Complications: No immediate post-treatment complications observed by team, or reported by patient.  Note: The patient tolerated the entire procedure well. A repeat set of vitals were taken after the procedure and the patient was kept under observation following institutional policy, for this type of procedure. Post-procedural neurological assessment was performed, showing return to baseline, prior to discharge. The patient was provided with post-procedure discharge instructions, including a section on how to identify potential problems. Should any problems arise concerning this procedure, the patient was given instructions to immediately contact us, at any time, without hesitation. In any case, we plan to contact the patient by telephone for a  follow-up status report regarding this interventional procedure.  Comments:  No additional relevant information.  Plan of Care  Orders:  Orders Placed This Encounter  Procedures   DG PAIN CLINIC C-ARM 1-60 MIN NO REPORT    Intraoperative interpretation by procedural physician at [facility].    Standing Status:   Standing    Number of Occurrences:   1    Order Specific Question:   Reason for exam:    Answer:   Assistance in needle guidance and placement for procedures requiring needle placement in or near specific anatomical locations not easily accessible without such assistance.    Medications administered: We administered lidocaine, lactated ringers, midazolam, fentaNYL, ropivacaine (PF) 2 mg/mL (0.2%), and ceFAZolin.  See the medical record for exact dosing, route, and time of administration.  Follow-up plan:   Return in about 1 week (around 09/12/2023) for SCS lead pull.      Recent Visits Date Type Provider Dept  07/26/23 Office Visit Edward Jolly, MD [facility]  Showing recent visits within past 90 days and meeting all other requirements Today's Visits Date Type Provider Dept  09/05/23 Procedure visit Edward Jolly, MD [facility]  Showing today's visits and meeting all other requirements Future Appointments Date Type Provider Dept  09/12/23 Appointment Edward Jolly, MD [facility]  Showing future appointments within next 90 days and meeting all other requirements  Disposition: Discharge home  Discharge (Date  Time): 09/05/2023; 1009 hrs.   Primary Care Physician: Margarita Mail, DO Location: [facility] Note by: Edward Jolly, MD (TTS technology used. I apologize for any typographical errors that were not detected and corrected.) Date: 09/05/2023; Time: 10:38 AM

## 2023-09-05 NOTE — Patient Instructions (Signed)
Today we did the following -We have done a Spinal Cord Stimulator Trial with [employer]  -As long as the leads are in place, do not bathe or shower. You may sponge bathe.  -While the lead is in place, please limit the bending, lifting, or twisting because the lead can move.  -The things we want to see is if your pain improves (and by what percentage), if you can do more activity (don't overdo it), and if you can use less of your "as needed" medicine. Do not stop long acting medicines like methadone, oxycontin, MS Contin, etc without checking with [place].  -It is VERY important that you pick up the antibiotics we prescribed, Keflex, on your way home from the trial and take them as prescribed(4 times a day), starting today, for as long as the lead is in place.  -The Spina Cord Stimulator Representative will be in contact with you while the lead is in place to make sure the trial goes as well as possible.  -Please contact us with any questions or concerns at any time during the trial.   -If you start running a fever over 100 degrees, have severe back pain, or new pain running down the legs, or drainage coming from the lead site, contact us immediately and/or go to the emergency room.  -Please do not restart any sort of medication that can thin your blood such as Aspirin, ibuprofen, motrin, aleve, plavix, coumadin, etc. If you aren't sure, call and ask.  -We will have you return next Monday to have the lead removed. If this is successful, at that point we can go over the details about the permanent implant.

## 2023-09-06 ENCOUNTER — Encounter: Payer: Medicare HMO | Admitting: Speech Pathology

## 2023-09-06 ENCOUNTER — Telehealth: Payer: Self-pay

## 2023-09-06 NOTE — Telephone Encounter (Addendum)
Post procedure follow up.  Patient states she is doing good this morning.

## 2023-09-08 ENCOUNTER — Ambulatory Visit: Payer: Medicare HMO

## 2023-09-08 VITALS — BP 118/60 | Ht 67.0 in | Wt 250.7 lb

## 2023-09-08 DIAGNOSIS — Z0000 Encounter for general adult medical examination without abnormal findings: Secondary | ICD-10-CM

## 2023-09-08 NOTE — Progress Notes (Signed)
Subjective:   Hailey Howard is a 67 y.o. female who presents for an Initial Medicare Annual Wellness Visit.  Visit Complete: In person  Patient Medicare AWV questionnaire was completed by the patient on 09/05/23 ; I have confirmed that all information answered by patient is correct and no changes since this date. Cardiac Risk Factors include: advanced age (>55men, >62 women);diabetes mellitus;dyslipidemia;hypertension;obesity (BMI >30kg/m2)    Objective:    Today's Vitals   09/08/23 1425 09/08/23 1431  BP: 118/60   Weight: 250 lb 11.2 oz (113.7 kg)   Height: 5\' 7"  (1.702 m)   PainSc:  6    Body mass index is 39.27 kg/m.     09/05/2023    8:00 AM 09/02/2023    1:19 PM 07/26/2023    9:11 AM 03/15/2023    7:44 AM 02/18/2023    1:37 PM 02/18/2023    1:11 PM 02/05/2022    1:22 PM  Advanced Directives  Does Patient Have a Medical Advance Directive? Yes Yes Yes Yes Yes Yes Yes  Type of Advance Directive  Living will;Healthcare Power of [employer] Power of [place];Living will Living will Living will Living will Healthcare Power of [place];Living will  Does patient want to make changes to medical advance directive? No - Patient declined        Copy of Healthcare Power of Attorney in Chart?   No - copy requested        Current Medications (verified) Outpatient Encounter Medications as of 09/08/2023  Medication Sig   amLODipine (NORVASC) 5 MG tablet Take 5 mg by mouth daily.    aspirin EC 81 MG tablet Take 81 mg by mouth once.   atorvastatin (LIPITOR) 40 MG tablet Take 1 tablet by mouth daily at 12 noon.   Cholecalciferol 25 MCG (1000 UT) tablet Take 1 tablet by mouth daily at 12 noon.   dicyclomine (BENTYL) 20 MG tablet Take 20 mg by mouth 2 (two) times daily as needed for spasms.    FIASP 100 UNIT/ML SOLN Inject 5 Units into the skin in the morning, at noon, and at bedtime.   fluconazole (DIFLUCAN) 150 MG tablet Take 1 tablet (150 mg total) by mouth once a week for 6  doses.   fluticasone (FLONASE) 50 MCG/ACT nasal spray Place 1 spray into both nostrils daily.   gabapentin (NEURONTIN) 300 MG capsule Take 300 mg by mouth daily.   insulin glargine (LANTUS SOLOSTAR) 100 UNIT/ML Solostar Pen Inject into the skin daily.   Iron-Vitamin C (VITRON-C) 65-125 MG TABS Take 1 tablet by mouth daily.   levothyroxine (SYNTHROID) 137 MCG tablet Take 137 mcg by mouth daily before breakfast.   linaclotide (LINZESS) 72 MCG capsule Take 72 mcg by mouth daily before breakfast.   Magnesium 250 MG TABS Take 250 mg by mouth daily.   metFORMIN (GLUCOPHAGE) 500 MG tablet Take 500-1,000 mg by mouth See admin instructions. Take 1 tablet (500 mg) by mouth in the morning & take 2 tablets (1000 mg) by mouth at night.   Multiple Vitamin (MULTIVITAMIN WITH MINERALS) TABS tablet Take 1 tablet by mouth daily.   nystatin (MYCOSTATIN/NYSTOP) powder Apply 1 Application topically 3 (three) times daily.   omeprazole (PRILOSEC) 40 MG capsule Take 40 mg by mouth daily.   Semaglutide (OZEMPIC, 0.25 OR 0.5 MG/DOSE, [place]) Inject into the skin.   sotalol (BETAPACE) 80 MG tablet Take 80 mg by mouth 2 (two) times daily.   spironolactone (ALDACTONE) 50 MG tablet Take 50 mg by  mouth daily.    telmisartan (MICARDIS) 80 MG tablet Take 80 mg by mouth daily.   torsemide (DEMADEX) 10 MG tablet Take 10 mg by mouth daily.   cephALEXin (KEFLEX) 500 MG capsule Take 1 capsule (500 mg total) by mouth 4 (four) times daily for 7 days. (Patient not taking: Reported on 09/08/2023)   No facility-administered encounter medications on file as of 09/08/2023.    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   Dysphagia    Dysrhythmia    Exertional dyspnea    GERD (gastroesophageal reflux disease)    Headache    History of hiatal hernia    History of shingles    HLD (hyperlipidemia)    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   SVG-OM2, SVG-D2, LIMA-LAD   Hx of thyroid cancer    s/p thyroidectomy   Hypertension    IBS (irritable bowel syndrome)    Mesenteric mass    Peripheral vascular disease (HCC)    Right BBB/left ant fasc block    Rotator cuff tendinitis, left    Thyroid disease    Unspecified atrial fibrillation (HCC)    Past Surgical History:  Procedure Laterality Date   ABDOMINAL HYSTERECTOMY     CHOLECYSTECTOMY     COLONOSCOPY WITH PROPOFOL N/A 12/20/2016   Procedure: COLONOSCOPY WITH PROPOFOL;  Surgeon: Scot Jun, MD;  Location: [facility] ENDOSCOPY;  Service: Endoscopy;  Laterality: N/A;   COLONOSCOPY WITH PROPOFOL N/A 01/19/2022   Procedure: COLONOSCOPY WITH PROPOFOL;  Surgeon: Regis Bill, MD;  Location: [facility] ENDOSCOPY;  Service: Endoscopy;  Laterality: N/A;   CORONARY ANGIOPLASTY     stents   CORONARY ARTERY BYPASS GRAFT  2002   3 vessels   ESOPHAGOGASTRODUODENOSCOPY N/A 03/15/2023   Procedure: ESOPHAGOGASTRODUODENOSCOPY (EGD);  Surgeon: Regis Bill, MD;  Location: [facility] ENDOSCOPY;  Service: Endoscopy;  Laterality: N/A;   ESOPHAGOGASTRODUODENOSCOPY (EGD) WITH PROPOFOL N/A 04/27/2017   Procedure: ESOPHAGOGASTRODUODENOSCOPY (EGD) WITH PROPOFOL;  Surgeon: Scot Jun, MD;  Location: [facility] ENDOSCOPY;  Service: Endoscopy;  Laterality: N/A;   EUS N/A 04/24/2015   Procedure: ESOPHAGEAL ENDOSCOPIC ULTRASOUND (EUS) RADIAL;  Surgeon: Wayland Salinas, MD;  Location[facility] ENDOSCOPY;  Service: Endoscopy;  Laterality: N/A;   JOINT REPLACEMENT      THYROIDECTOMY, PARTIAL     TOTAL HIP ARTHROPLASTY Left 02/24/2021   Procedure: TOTAL HIP ARTHROPLASTY ANTERIOR APPROACH;  Surgeon: Kennedy Bucker, MD;  Location: [facility] ORS;  Service: Orthopedics;  Laterality: Left;   Family History  Problem Relation Age of Onset   Arthritis Sister    Heart disease Sister    Arthritis Sister    Arthritis Sister    Breast cancer Neg Hx    Social History   Socioeconomic History   Marital status: Married    Spouse name: Emmaline Kluver   Number of children: Not on file   Years of education: Not on file   Highest education level: Associate  degree: occupational, Scientist, product/process development, or vocational program  Occupational History   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 Continues to work part time.   Insulin pump   Social Determinants of Health   Financial Resource Strain: Medium Risk (09/05/2023)   Overall Financial Resource Strain (CARDIA)    Difficulty of Paying Living Expenses: Somewhat hard  Food Insecurity: No Food Insecurity (09/05/2023)   Hunger Vital Sign    Worried About Running Out of Food in the Last Year: Never true    Ran Out of Food in the Last Year: Never true  Transportation Needs: No Transportation Needs (09/05/2023)   PRAPARE - Administrator, Civil Service (Medical): No    Lack of Transportation (Non-Medical): No  Physical Activity: Insufficiently Active (09/05/2023)   Exercise Vital Sign    Days of Exercise per Week: 3 days    Minutes of Exercise per Session: 20 min  Stress: No Stress Concern Present (09/05/2023)   [employer] of Occupational Health - Occupational  Stress Questionnaire    Feeling of Stress : Only a little  Social Connections: Socially Integrated (09/05/2023)   Social Connection and Isolation Panel [NHANES]    Frequency of Communication with Friends and Family: Three times a week    Frequency of Social Gatherings with Friends and Family: Once a week    Attends Religious Services: More than 4 times per year    Active Member of [employer] or Organizations: No    Attends Engineer, structural: More than 4 times per year    Marital Status: Married    Tobacco Counseling Counseling given: Not Answered   Clinical Intake:  Pre-visit preparation completed: No  Pain : 0-10 Pain Score: 6  Pain Type: Chronic pain Pain Location: Hip (chronic lower back pain also) Pain Orientation: Left Pain Descriptors / Indicators: Aching Pain Onset: More than a month ago Pain Frequency: Constant Pain Relieving Factors: medication;heat  Pain Relieving Factors: medication;heat  BMI - recorded: 39.27 Nutritional Status: BMI > 30  Obese Nutritional Risks: None Diabetes: Yes CBG done?: No (BS 111 this am at home) Did pt. bring in CBG monitor from home?: No  How often do you need to have someone help you when you read instructions, pamphlets, or other written materials from your doctor or pharmacy?: 1 - Never  Interpreter Needed?: No  Comments: lives with others Information entered by :: B.Amaliya Whitelaw,LPN   Activities of Daily Living    09/05/2023    7:12 AM 08/08/2023    2:13 PM  In your present state of health, do you have any difficulty performing the following activities:  Hearing? 0 0  Vision? 0 0  Difficulty concentrating or making decisions? 0 0  Walking or climbing stairs? 0 0  Dressing or bathing? 0 0  Doing errands, shopping? 0 0  Preparing Food and eating ? N   Using the Toilet? N   In the past six months, have you accidently leaked urine? N   Do you have problems with loss of bowel control? N   Managing your Medications?  N   Managing your Finances? N   Housekeeping or managing your Housekeeping? N     Patient Care Team: Margarita Mail, DO as PCP - General (Internal Medicine) Earna Coder, MD as Consulting Physician (Hematology and Oncology)  Indicate any recent Medical Services you may have received from other than [facility] providers in the past year (date may be approximate).     Assessment:   This is a routine wellness examination for Shanvi.  Hearing/Vision screen Hearing Screening - Comments:: Pt says her hearing is good Vision Screening - Comments:: Pt says vision is good only readers Dr Mellody Dance B [place]-visit last week   Goals Addressed             This Visit's Progress    Patient Stated       I would like to improve A1C and mange pain better (subside). I would like to lose at least 30lbs.       Depression Screen    09/08/2023    2:37 PM 09/05/2023    8:01 AM 08/08/2023    2:12 PM 07/26/2023    9:11 AM 07/15/2023    2:23 PM 06/07/2023   10:29 AM 05/30/2023    1:15 PM  PHQ 2/9 Scores  PHQ - 2 Score 0 0 0 0 0 0 0  PHQ- 9 Score   0  0 0 0    Fall Risk    09/05/2023    8:01 AM 09/05/2023    7:12 AM 08/08/2023    2:12 PM 07/26/2023    9:11 AM 07/15/2023    2:23 PM  Fall Risk   Falls in the past year? 0 0 0 0 0  Number falls in past yr:  0 0 0 0  Injury with Fall?  0 0 0 0  Risk for fall due to :  No Fall Risks     Follow up  Education provided;Falls prevention discussed       MEDICARE RISK AT HOME: Medicare Risk at Home Any stairs in or around the home?: No If so, are there any without handrails?: No Home free of loose throw rugs in walkways, pet beds, electrical cords, etc?: No Adequate lighting in your home to reduce risk of falls?: Yes Life alert?: Yes Use of a cane, walker or w/c?: Yes Grab bars in the bathroom?: Yes Shower chair or bench in shower?: No Elevated toilet seat or a handicapped toilet?: No  TIMED UP AND GO:  Was the test performed? Yes  Length  of time to ambulate 10 feet: 10 sec Gait steady and fast without use of assistive device    Cognitive Function:        09/08/2023    2:49 PM  6CIT Screen  What Year? 0 points  What month? 0 points  What time? 0 points  Count back from 20 0 points  Months in reverse 0 points  Repeat phrase 0 points  Total Score 0 points    Immunizations Immunization History  Administered Date(s) Administered   Influenza-Unspecified 08/26/2010, 09/03/2011, 09/29/2012, 10/14/2015, 09/19/2019   Moderna Sars-Covid-2 Vaccination 02/04/2020, 02/25/2020   PNEUMOCOCCAL CONJUGATE-20 05/30/2023   Zoster Recombinant(Shingrix) 10/10/2019    TDAP status: Up to date  Flu Vaccine status: Up to date  Pneumococcal vaccine status: Up to date  Covid-19 vaccine status: Completed vaccines  Qualifies for Shingles Vaccine? Yes   Zostavax completed Yes   Shingrix Completed?: No.    Education has been provided regarding the importance of this  vaccine. Patient has been advised to call insurance company to determine out of pocket expense if they have not yet received this vaccine. Advised may also receive vaccine at local pharmacy or Health Dept. Verbalized acceptance and understanding.  Screening Tests Health Maintenance  Topic Date Due   Zoster Vaccines- Shingrix (2 of 2) 12/05/2019   HEMOGLOBIN A1C  07/30/2023   COVID-19 Vaccine (3 - Moderna risk series) 09/24/2023 (Originally 03/24/2020)   Diabetic kidney evaluation - Urine ACR  10/29/2023   FOOT EXAM  02/03/2024   OPHTHALMOLOGY EXAM  08/30/2024   Diabetic kidney evaluation - eGFR measurement  09/01/2024   Medicare Annual Wellness (AWV)  09/07/2024   MAMMOGRAM  12/29/2024   Colonoscopy  01/20/2032   Pneumonia Vaccine 80+ Years old  Completed   DEXA SCAN  Completed   Hepatitis C Screening  Completed   HPV VACCINES  Aged Out   DTaP/Tdap/Td  Discontinued    Health Maintenance  Health Maintenance Due  Topic Date Due   Zoster Vaccines- Shingrix (2 of  2) 12/05/2019   HEMOGLOBIN A1C  07/30/2023    Colorectal cancer screening: Type of screening: Colonoscopy. Completed 01/19/2022. Repeat every 10 years  Mammogram status: Completed 01/18/23. Repeat every year  Bone Density status: Completed 12/03/2021. Results reflect: Bone density results: NORMAL. Repeat every 5 years.  Lung Cancer Screening: (Low Dose CT Chest recommended if Age 58-80 years, 20 pack-year currently smoking OR have quit w/in 15years.) does not qualify.   Lung Cancer Screening Referral: no  Additional Screening:  Hepatitis C Screening: does not qualify; Completed 08/15/2018  Vision Screening: Recommended annual ophthalmology exams for early detection of glaucoma and other disorders of the eye. Is the patient up to date with their annual eye exam?  Yes  Who is the provider or what is the name of the office in which the patient attends annual eye exams? Dr Sheppard Plumber [place]  If pt is not established with a provider, would they like to be referred to a provider to establish care? No .   Dental Screening: Recommended annual dental exams for proper oral hygiene  Diabetic Foot Exam: Diabetic Foot Exam: Completed 06/02/23  Community Resource Referral / Chronic Care Management: CRR required this visit?  No   CCM required this visit?  No    Plan:     I have personally reviewed and noted the following in the patient's chart:   Medical and social history Use of alcohol, tobacco or illicit drugs  Current medications and supplements including opioid prescriptions. Patient is not currently taking opioid prescriptions. Functional ability and status Nutritional status Physical activity Advanced directives List of other physicians Hospitalizations, surgeries, and ER visits in previous 12 months Vitals Screenings to include cognitive, depression, and falls Referrals and appointments  In addition, I have reviewed and discussed with patient certain preventive protocols, quality  metrics, and best practice recommendations. A written personalized care plan for preventive services as well as general preventive health recommendations were provided to patient.    Sue Lush, LPN   [id number]   After Visit Summary: (MyChart) Due to this being a telephonic visit, the after visit summary with patients personalized plan was offered to patient via MyChart   Nurse Notes: The patient states she is doing well and has no concerns or questions at this time.

## 2023-09-08 NOTE — Patient Instructions (Signed)
Hailey Howard , Thank you for taking time to come for your Medicare Wellness Visit. I appreciate your ongoing commitment to your health goals. Please review the following plan we discussed and let me know if I can assist you in the future.   Referrals/Orders/Follow-Ups/Clinician Recommendations: none  This is a list of the screening recommended for you and due dates:  Health Maintenance  Topic Date Due   Zoster (Shingles) Vaccine (2 of 2) 12/05/2019   Hemoglobin A1C  07/30/2023   COVID-19 Vaccine (3 - Moderna risk series) 09/24/2023*   Yearly kidney health urinalysis for diabetes  10/29/2023   Complete foot exam   02/03/2024   Eye exam for diabetics  08/30/2024   Yearly kidney function blood test for diabetes  09/01/2024   Medicare Annual Wellness Visit  09/07/2024   Mammogram  12/29/2024   Colon Cancer Screening  01/20/2032   Pneumonia Vaccine  Completed   DEXA scan (bone density measurement)  Completed   Hepatitis C Screening  Completed   HPV Vaccine  Aged Out   DTaP/Tdap/Td vaccine  Discontinued  *Topic was postponed. The date shown is not the original due date.    Advanced directives: (In Chart) A copy of your advanced directives are scanned into your chart should your provider ever need it.  Next Medicare Annual Wellness Visit scheduled for next year: Yes  09/13/24 @ 2:20pm in person

## 2023-09-12 ENCOUNTER — Ambulatory Visit
Admission: RE | Admit: 2023-09-12 | Discharge: 2023-09-12 | Disposition: A | Discharge status: Home / Self Care | Payer: Medicare HMO | Source: Ambulatory Visit | Attending: Student in an Organized Health Care Education/Training Program | Admitting: Student in an Organized Health Care Education/Training Program

## 2023-09-12 ENCOUNTER — Encounter: Payer: Self-pay | Admitting: Student in an Organized Health Care Education/Training Program

## 2023-09-12 ENCOUNTER — Ambulatory Visit
Payer: Medicare HMO | Attending: Student in an Organized Health Care Education/Training Program | Admitting: Student in an Organized Health Care Education/Training Program

## 2023-09-12 VITALS — BP 132/77 | HR 63 | Temp 97.2°F | Ht 67.0 in | Wt 250.0 lb

## 2023-09-12 DIAGNOSIS — M5416 Radiculopathy, lumbar region: Secondary | ICD-10-CM

## 2023-09-12 DIAGNOSIS — G8929 Other chronic pain: Secondary | ICD-10-CM

## 2023-09-12 DIAGNOSIS — G894 Chronic pain syndrome: Secondary | ICD-10-CM

## 2023-09-12 NOTE — Progress Notes (Signed)
Safety precautions to be maintained throughout the outpatient stay will include: orient to surroundings, keep bed in low position, maintain call bell within reach at all times, provide assistance with transfer out of bed and ambulation.  

## 2023-09-12 NOTE — Progress Notes (Signed)
Patient: Hailey Howard  Service Category: Procedure  Provider: Edward Jolly, MD  DOB: 28-Jul-1956  DOS: 09/12/2023  Referring Provider: Margarita Mail, DO  MRN: [id number]  Setting: Ambulatory outpatient  PCP: Margarita Mail, DO  Type: Established Patient  Specialty: Interventional Pain Management    Location: Office    Delivery: Face-to-face  SCS TRIAL POST-OP EVALUATION   Primary Reason(s) for Visit: Encounter for removal of temporary spinal cord stimulator lead(s) and evaluation of trial implant. CC: Back Pain (lower)  HPI  Hailey Howard is a 67 y.o. year old, female patient, who comes today for a post-procedure evaluation. She has Coronary artery disease; DM type 2 with diabetic peripheral neuropathy (HCC); Edema; Hyperlipidemia; Hypertension; Bursitis of left shoulder; Tachycardia; Atrial fibrillation (HCC); Mesenteric mass; Anemia of chronic kidney failure, stage 3 (moderate) (HCC); Multinodular goiter; S/P total thyroidectomy; Thyroid cancer (HCC); S/P hip replacement; History of iron deficiency anemia; Lumbar radiculopathy; Spinal stenosis, lumbar region, with neurogenic claudication; Chronic painful diabetic neuropathy (HCC); and Chronic pain syndrome on their problem list. Her primarily concern today is the Back Pain (lower)  Pain Assessment: Location: Lower Back Radiating: denies Onset: More than a month ago Duration: Chronic pain Quality: Constant, Aching Severity: 5 /10 (subjective, self-reported pain score)  Effect on ADL: limits ADLS Timing: Constant Modifying factors: denies BP: 132/77  HR: 63  Hailey Howard comes in today, after a SCS (Spinal Cord Stimulator) Trial Implant on 09/06/2023, to have her percutaneous, temporary neurostimulator lead(s) removed and to evaluate the trial experience to determine if a permanent implant may be effective in controlling some or all of her chronic pain symptoms.  Further details on both, my assessment(s), as well as the proposed  treatment plan, please see below.  Unfortunately, the patient did not receive greater than 50% pain relief with her spinal cord stim trial.  At this point she does not want to move forward with permanent implant.  Spinal cord stimulator trial leads were removed under live fluoroscopy, tips intact  Post-operative Assessment  Intra-procedural problems/complications: None observed.         Reported side-effects: None.        Post-surgical adverse reactions or complications: None reported         Laboratory Chemistry Profile   Renal Lab Results  Component Value Date   BUN 22 09/02/2023   CREATININE 1.52 (H) 09/02/2023   LABCREA 159.2 10/28/2022   BCR 17 06/07/2023   GFRAA 39 (L) 07/25/2020   GFRNONAA 37 (L) 09/02/2023   PROTEINUR NEGATIVE 02/17/2021     Electrolytes Lab Results  Component Value Date   NA 139 09/02/2023   K 4.3 09/02/2023   CL 107 09/02/2023   CALCIUM 8.7 (L) 09/02/2023     Hepatic Lab Results  Component Value Date   AST 14 06/07/2023   ALT 11 06/07/2023   ALBUMIN 4.0 05/29/2021   ALKPHOS 67 05/29/2021   LIPASE 37 09/19/2018     ID Lab Results  Component Value Date   SARSCOV2NAA NEGATIVE 02/20/2021   STAPHAUREUS NEGATIVE 02/17/2021   MRSAPCR NEGATIVE 02/17/2021     Bone No results found for: "VD25OH", "VD125OH2TOT", "HQ4696EX5", "MW4132GM0", "25OHVITD1", "25OHVITD2", "25OHVITD3", "TESTOFREE", "TESTOSTERONE"   Endocrine Lab Results  Component Value Date   GLUCOSE 129 (H) 09/02/2023   GLUCOSEU NEGATIVE 02/17/2021   HGBA1C 8.0 01/27/2023     Neuropathy Lab Results  Component Value Date   VITAMINB12 1,319 (H) 08/17/2019   FOLATE 15.5 08/17/2019   HGBA1C 8.0 01/27/2023  CNS No results found for: "COLORCSF", "APPEARCSF", "RBCCOUNTCSF", "WBCCSF", "POLYSCSF", "LYMPHSCSF", "EOSCSF", "PROTEINCSF", "GLUCCSF", "JCVIRUS", "CSFOLI", "IGGCSF", "LABACHR", "ACETBL"   Inflammation (CRP: Acute  ESR: Chronic) No results found for: "CRP", "ESRSEDRATE",  "LATICACIDVEN"   Rheumatology No results found for: "RF", "ANA", "LABURIC", "URICUR", "LYMEIGGIGMAB", "LYMEABIGMQN", "HLAB27"   Coagulation Lab Results  Component Value Date   INR 1.0 03/10/2015   LABPROT 13.3 03/10/2015   APTT 24.2 03/10/2015   PLT 313 09/02/2023     Cardiovascular Lab Results  Component Value Date   BNP 53.0 01/29/2020   TROPONINI 0.03 12/19/2015   HGB 10.4 (L) 09/02/2023   HCT 33.5 (L) 09/02/2023     Screening Lab Results  Component Value Date   SARSCOV2NAA NEGATIVE 02/20/2021   STAPHAUREUS NEGATIVE 02/17/2021   MRSAPCR NEGATIVE 02/17/2021     Cancer Lab Results  Component Value Date   CEA 2.6 03/10/2015   CA125 10.7 03/10/2015     Allergens No results found for: "ALMOND", "APPLE", "ASPARAGUS", "AVOCADO", "BANANA", "BARLEY", "BASIL", "BAYLEAF", "GREENBEAN", "LIMABEAN", "WHITEBEAN", "BEEFIGE", "REDBEET", "BLUEBERRY", "BROCCOLI", "CABBAGE", "MELON", "CARROT", "CASEIN", "CASHEWNUT", "CAULIFLOWER", "CELERY"     Note: Lab results reviewed.  Recent Imaging Results  No results found for this or any previous visit.  Interpretation Report: Fluoroscopy was used during the procedure to assist with needle guidance. The images were interpreted intraoperatively by the requesting physician.        Meds   Current Outpatient Medications:    amLODipine (NORVASC) 5 MG tablet, Take 5 mg by mouth daily. , Disp: , Rfl:    aspirin EC 81 MG tablet, Take 81 mg by mouth once., Disp: , Rfl:    atorvastatin (LIPITOR) 40 MG tablet, Take 1 tablet by mouth daily at 12 noon., Disp: , Rfl:    Cholecalciferol 25 MCG (1000 UT) tablet, Take 1 tablet by mouth daily at 12 noon., Disp: , Rfl:    dicyclomine (BENTYL) 20 MG tablet, Take 20 mg by mouth 2 (two) times daily as needed for spasms. , Disp: , Rfl:    FIASP 100 UNIT/ML SOLN, Inject 5 Units into the skin in the morning, at noon, and at bedtime., Disp: , Rfl:    fluconazole (DIFLUCAN) 150 MG tablet, Take 1 tablet (150 mg  total) by mouth once a week for 6 doses., Disp: 6 tablet, Rfl: 0   fluticasone (FLONASE) 50 MCG/ACT nasal spray, Place 1 spray into both nostrils daily., Disp: , Rfl:    gabapentin (NEURONTIN) 300 MG capsule, Take 300 mg by mouth daily., Disp: , Rfl:    insulin glargine (LANTUS SOLOSTAR) 100 UNIT/ML Solostar Pen, Inject into the skin daily., Disp: , Rfl:    Iron-Vitamin C (VITRON-C) 65-125 MG TABS, Take 1 tablet by mouth daily., Disp: , Rfl:    levothyroxine (SYNTHROID) 137 MCG tablet, Take 137 mcg by mouth daily before breakfast., Disp: , Rfl:    linaclotide (LINZESS) 72 MCG capsule, Take 72 mcg by mouth daily before breakfast., Disp: , Rfl:    Magnesium 250 MG TABS, Take 250 mg by mouth daily., Disp: , Rfl:    metFORMIN (GLUCOPHAGE) 500 MG tablet, Take 500-1,000 mg by mouth See admin instructions. Take 1 tablet (500 mg) by mouth in the morning & take 2 tablets (1000 mg) by mouth at night., Disp: , Rfl:    Multiple Vitamin (MULTIVITAMIN WITH MINERALS) TABS tablet, Take 1 tablet by mouth daily., Disp: , Rfl:    nystatin (MYCOSTATIN/NYSTOP) powder, Apply 1 Application topically 3 (three) times daily., Disp: 15  g, Rfl: 0   omeprazole (PRILOSEC) 40 MG capsule, Take 40 mg by mouth daily., Disp: , Rfl:    Semaglutide (OZEMPIC, 0.25 OR 0.5 MG/DOSE, [place]), Inject into the skin., Disp: , Rfl:    sotalol (BETAPACE) 80 MG tablet, Take 80 mg by mouth 2 (two) times daily., Disp: , Rfl:    spironolactone (ALDACTONE) 50 MG tablet, Take 50 mg by mouth daily. , Disp: , Rfl:    telmisartan (MICARDIS) 80 MG tablet, Take 80 mg by mouth daily., Disp: , Rfl:    torsemide (DEMADEX) 10 MG tablet, Take 10 mg by mouth daily., Disp: , Rfl:    cephALEXin (KEFLEX) 500 MG capsule, Take 1 capsule (500 mg total) by mouth 4 (four) times daily for 7 days. (Patient not taking: Reported on 09/08/2023), Disp: 28 capsule, Rfl: 0  ROS  Constitutional: Denies any fever or chills Gastrointestinal: No reported hemesis, hematochezia,  vomiting, or acute GI distress Musculoskeletal: Denies any acute onset joint swelling, redness, loss of ROM, or weakness Neurological: No reported episodes of acute onset apraxia, aphasia, dysarthria, agnosia, amnesia, paralysis, loss of coordination, or loss of consciousness  Allergies  Hailey Howard is allergic to other, iodinated contrast media, oxycodone-acetaminophen, and etodolac.  PFSH  Drug: Hailey Howard  reports no history of drug use. Alcohol:  reports no history of alcohol use. Tobacco:  reports that she has never smoked. She has never used smokeless tobacco. 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 Surgical: Hailey Howard  has a past surgical history that includes Cholecystectomy; Abdominal hysterectomy; Thyroidectomy, partial; EUS (N/A, 04/24/2015); Colonoscopy with propofol (N/A, 12/20/2016); Esophagogastroduodenoscopy (egd) with propofol (N/A, 04/27/2017); Coronary angioplasty; Coronary artery bypass graft (2002); Total hip arthroplasty (Left, 02/24/2021); Colonoscopy with propofol (N/A, 01/19/2022); Joint replacement; and Esophagogastroduodenoscopy (N/A, 03/15/2023). Family: family history includes Arthritis in her sister, sister, and  sister; Heart disease in her sister.  Postop Exam  General appearance: Afebrile. Well nourished, well developed, and well hydrated. In no apparent acute distress. Vitals:   09/12/23 0803  BP: 132/77  Pulse: 63  Temp: (!) 97.2 F (36.2 C)  SpO2: 100%  Weight: 250 lb (113.4 kg)  Height: 5\' 7"  (1.702 m)   BMI Assessment: Estimated body mass index is 39.16 kg/m as calculated from the following:   Height as of this encounter: 5\' 7"  (1.702 m).   Weight as of this encounter: 250 lb (113.4 kg).  Surgical site: Wound is healing well. No redness, tenderness, discharge, abnormal odors, or any other evidence of infection or complications.  Assessment  Primary Diagnosis & Pertinent Problem List: The primary encounter diagnosis was Chronic pain syndrome. Diagnoses of Chronic radicular lumbar pain and Lumbar radiculopathy were also pertinent to this visit.  Diagnosis Status  1. Chronic pain syndrome   2. Chronic radicular lumbar pain   3. Lumbar radiculopathy    Controlled Controlled Controlled   Plan of Care  Orders:  Orders Placed This Encounter  Procedures  DG PAIN CLINIC C-ARM 1-60 MIN NO REPORT    Intraoperative interpretation by procedural physician at [facility].    Standing Status:   Standing    Number of Occurrences:   1    Order Specific Question:   Reason for exam:    Answer:   Assistance in needle guidance and placement for procedures requiring needle placement in or near specific anatomical locations not easily accessible without such assistance.   Future considerations include Sprint peripheral nerve stimulation.  Medications administered: Manfred Shirts had no medications administered during this visit.  See the medical record for exact dosing, route, and time of administration.  Follow-up plan:   Return in about 6 years (around 09/11/2029) for  eval in person to discuss PNS.       Recent Visits Date Type Provider Dept  09/05/23 Procedure  visit Edward Jolly, MD [facility]  07/26/23 Office Visit Edward Jolly, MD [facility]  Showing recent visits within past 90 days and meeting all other requirements Today's Visits Date Type Provider Dept  09/12/23 Procedure visit Edward Jolly, MD [facility]  Showing today's visits and meeting all other requirements Future Appointments Date Type Provider Dept  10/05/23 Appointment Edward Jolly, MD [facility]  Showing future appointments within next 90 days and meeting all other requirements  Disposition: Discharge home  Discharge (Date  Time): 09/12/2023; 0830 hrs.   Primary Care Physician: Margarita Mail, DO Location: [facility] Note by: Edward Jolly, MD (TTS technology used. I apologize for any typographical errors that were not detected and corrected.) Date: 09/12/2023; Time: 8:37 AM

## 2023-09-12 NOTE — Patient Instructions (Signed)

## 2023-09-12 NOTE — Progress Notes (Signed)
[id number] Dr. Cherylann Ratel here for lead pull. Tolerated well. Leads intact and site clear. Wound care instructions given.

## 2023-09-13 ENCOUNTER — Encounter: Payer: Medicare HMO | Admitting: Speech Pathology

## 2023-09-13 ENCOUNTER — Telehealth: Payer: Self-pay

## 2023-09-13 NOTE — Telephone Encounter (Signed)
Post procedure follow up.  Patient states she is doing good.  

## 2023-09-14 DIAGNOSIS — D2361 Other benign neoplasm of skin of right upper limb, including shoulder: Secondary | ICD-10-CM | POA: Diagnosis not present

## 2023-09-14 DIAGNOSIS — L821 Other seborrheic keratosis: Secondary | ICD-10-CM | POA: Diagnosis not present

## 2023-09-14 DIAGNOSIS — L2989 Other pruritus: Secondary | ICD-10-CM | POA: Diagnosis not present

## 2023-09-15 DIAGNOSIS — E890 Postprocedural hypothyroidism: Secondary | ICD-10-CM | POA: Diagnosis not present

## 2023-09-15 DIAGNOSIS — E113293 Type 2 diabetes mellitus with mild nonproliferative diabetic retinopathy without macular edema, bilateral: Secondary | ICD-10-CM | POA: Diagnosis not present

## 2023-09-15 DIAGNOSIS — E1159 Type 2 diabetes mellitus with other circulatory complications: Secondary | ICD-10-CM | POA: Diagnosis not present

## 2023-09-15 DIAGNOSIS — E1122 Type 2 diabetes mellitus with diabetic chronic kidney disease: Secondary | ICD-10-CM | POA: Diagnosis not present

## 2023-09-15 DIAGNOSIS — E1169 Type 2 diabetes mellitus with other specified complication: Secondary | ICD-10-CM | POA: Diagnosis not present

## 2023-09-15 DIAGNOSIS — E1142 Type 2 diabetes mellitus with diabetic polyneuropathy: Secondary | ICD-10-CM | POA: Diagnosis not present

## 2023-09-15 DIAGNOSIS — Z85850 Personal history of malignant neoplasm of thyroid: Secondary | ICD-10-CM | POA: Diagnosis not present

## 2023-09-15 DIAGNOSIS — E785 Hyperlipidemia, unspecified: Secondary | ICD-10-CM | POA: Diagnosis not present

## 2023-09-15 DIAGNOSIS — E1129 Type 2 diabetes mellitus with other diabetic kidney complication: Secondary | ICD-10-CM | POA: Diagnosis not present

## 2023-09-16 DIAGNOSIS — Z79899 Other long term (current) drug therapy: Secondary | ICD-10-CM | POA: Diagnosis not present

## 2023-09-16 DIAGNOSIS — E119 Type 2 diabetes mellitus without complications: Secondary | ICD-10-CM | POA: Diagnosis not present

## 2023-09-16 DIAGNOSIS — Z7982 Long term (current) use of aspirin: Secondary | ICD-10-CM | POA: Diagnosis not present

## 2023-09-16 DIAGNOSIS — R9431 Abnormal electrocardiogram [ECG] [EKG]: Secondary | ICD-10-CM | POA: Diagnosis not present

## 2023-09-16 DIAGNOSIS — E669 Obesity, unspecified: Secondary | ICD-10-CM | POA: Diagnosis not present

## 2023-09-16 DIAGNOSIS — I480 Paroxysmal atrial fibrillation: Secondary | ICD-10-CM | POA: Diagnosis not present

## 2023-09-16 DIAGNOSIS — Z951 Presence of aortocoronary bypass graft: Secondary | ICD-10-CM | POA: Diagnosis not present

## 2023-09-16 DIAGNOSIS — Z5181 Encounter for therapeutic drug level monitoring: Secondary | ICD-10-CM | POA: Diagnosis not present

## 2023-09-16 DIAGNOSIS — I2510 Atherosclerotic heart disease of native coronary artery without angina pectoris: Secondary | ICD-10-CM | POA: Diagnosis not present

## 2023-09-20 ENCOUNTER — Encounter: Payer: Medicare HMO | Admitting: Speech Pathology

## 2023-09-27 ENCOUNTER — Encounter: Payer: Medicare HMO | Admitting: Speech Pathology

## 2023-09-28 DIAGNOSIS — M25552 Pain in left hip: Secondary | ICD-10-CM | POA: Diagnosis not present

## 2023-09-28 DIAGNOSIS — Z96642 Presence of left artificial hip joint: Secondary | ICD-10-CM | POA: Diagnosis not present

## 2023-09-28 DIAGNOSIS — M7062 Trochanteric bursitis, left hip: Secondary | ICD-10-CM | POA: Diagnosis not present

## 2023-09-29 ENCOUNTER — Other Ambulatory Visit: Payer: Self-pay | Admitting: Orthopedic Surgery

## 2023-09-29 DIAGNOSIS — I2510 Atherosclerotic heart disease of native coronary artery without angina pectoris: Secondary | ICD-10-CM | POA: Diagnosis not present

## 2023-09-29 DIAGNOSIS — Z96642 Presence of left artificial hip joint: Secondary | ICD-10-CM

## 2023-09-29 DIAGNOSIS — M7062 Trochanteric bursitis, left hip: Secondary | ICD-10-CM

## 2023-09-29 DIAGNOSIS — M25552 Pain in left hip: Secondary | ICD-10-CM

## 2023-09-29 DIAGNOSIS — I2583 Coronary atherosclerosis due to lipid rich plaque: Secondary | ICD-10-CM | POA: Diagnosis not present

## 2023-09-30 ENCOUNTER — Encounter: Payer: Self-pay | Admitting: Internal Medicine

## 2023-10-03 ENCOUNTER — Telehealth: Payer: Self-pay

## 2023-10-03 DIAGNOSIS — J301 Allergic rhinitis due to pollen: Secondary | ICD-10-CM | POA: Diagnosis not present

## 2023-10-03 DIAGNOSIS — K219 Gastro-esophageal reflux disease without esophagitis: Secondary | ICD-10-CM | POA: Diagnosis not present

## 2023-10-03 NOTE — Telephone Encounter (Signed)
error 

## 2023-10-04 ENCOUNTER — Encounter: Payer: Medicare HMO | Admitting: Speech Pathology

## 2023-10-05 ENCOUNTER — Ambulatory Visit
Payer: Medicare HMO | Attending: Student in an Organized Health Care Education/Training Program | Admitting: Student in an Organized Health Care Education/Training Program

## 2023-10-05 ENCOUNTER — Encounter: Payer: Self-pay | Admitting: Student in an Organized Health Care Education/Training Program

## 2023-10-05 VITALS — BP 140/63 | HR 76 | Temp 97.2°F | Resp 16 | Ht 67.0 in | Wt 247.0 lb

## 2023-10-05 DIAGNOSIS — G8929 Other chronic pain: Secondary | ICD-10-CM | POA: Insufficient documentation

## 2023-10-05 DIAGNOSIS — Z7984 Long term (current) use of oral hypoglycemic drugs: Secondary | ICD-10-CM | POA: Diagnosis not present

## 2023-10-05 DIAGNOSIS — E1140 Type 2 diabetes mellitus with diabetic neuropathy, unspecified: Secondary | ICD-10-CM | POA: Insufficient documentation

## 2023-10-05 DIAGNOSIS — M48062 Spinal stenosis, lumbar region with neurogenic claudication: Secondary | ICD-10-CM | POA: Diagnosis not present

## 2023-10-05 DIAGNOSIS — G894 Chronic pain syndrome: Secondary | ICD-10-CM | POA: Insufficient documentation

## 2023-10-05 DIAGNOSIS — M5416 Radiculopathy, lumbar region: Secondary | ICD-10-CM | POA: Insufficient documentation

## 2023-10-05 NOTE — Progress Notes (Signed)
PROVIDER NOTE: Information contained herein reflects review and annotations entered in association with encounter. Interpretation of such information and data should be left to medically-trained personnel. Information provided to patient can be located elsewhere in the medical record under "Patient Instructions". Document created using STT-dictation technology, any transcriptional errors that may result from process are unintentional.    Patient: Hailey Howard  Service Category: E/M  Provider: Edward Jolly, MD  DOB: 13-Sep-1956  DOS: 10/05/2023  Referring Provider: Margarita Mail, DO  MRN: [id number]  Specialty: Interventional Pain Management  PCP: Margarita Mail, DO  Type: Established Patient  Setting: Ambulatory outpatient    Location: Office  Delivery: Face-to-face     HPI  [name], a 67 y.o. year old female, is here today because of her Chronic pain syndrome [G89.4]. Hailey Howard's primary complain today is Back Pain (lower)  Pain Assessment: Severity of Chronic pain is reported as a 4 /10. Location: Back Lower/denies. Onset: More than a month ago. Quality: Constant. Timing: Constant. Modifying factor(s): medications, cream. Vitals:  height is 5\' 7"  (1.702 m) and weight is 247 lb (112 kg). Her temporal temperature is 97.2 F (36.2 C) (abnormal). Her blood pressure is 140/63 (abnormal) and her pulse is 76. Her respiration is 16 and oxygen saturation is 100%.  BMI: Estimated body mass index is 38.69 kg/m as calculated from the following:   Height as of this encounter: 5\' 7"  (1.702 m).   Weight as of this encounter: 247 lb (112 kg). Last encounter: 07/26/2023. Last procedure: 09/12/2023.  Reason for encounter:   Patient is status post spinal cord stimulator trial that unfortunately was not very helpful. She elected not to move forward with permanent implant.  She has been seeing orthopedics for persistent left hip pain.  She has a history of left hip total  arthroplasty. We briefly discussed Sprint peripheral nerve stimulation of the lumbar medial branches today.  She states that she wants to hold off at this time until she gets her left hip addressed.  This is reasonable.  Follow-up as needed.    ROS  Constitutional: Denies any fever or chills Gastrointestinal: No reported hemesis, hematochezia, vomiting, or acute GI distress Musculoskeletal:  Low back and left hip pain Neurological: No reported episodes of acute onset apraxia, aphasia, dysarthria, agnosia, amnesia, paralysis, loss of coordination, or loss of consciousness  Medication Review  Cholecalciferol, Insulin Aspart (w/Niacinamide), Iron-Vitamin C, Magnesium, Semaglutide, amLODipine, aspirin EC, atorvastatin, dicyclomine, fluticasone, gabapentin, insulin glargine, levothyroxine, linaclotide, metFORMIN, multivitamin with minerals, nystatin, omeprazole, sotalol, spironolactone, telmisartan, and torsemide  History Review  Allergy: Hailey Howard is allergic to other, iodinated contrast media, oxycodone-acetaminophen, and etodolac. Drug: Hailey Howard  reports no history of drug use. Alcohol:  reports no history of alcohol use. Tobacco:  reports that she has never smoked. She has never used smokeless tobacco. Social: Hailey Howard  reports that she has never smoked. She has never used smokeless tobacco. She reports that she does not drink alcohol and does not use drugs. 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 Surgical: Hailey Howard  has a past surgical history that includes Cholecystectomy; Abdominal hysterectomy; Thyroidectomy, partial; EUS (N/A, 04/24/2015); Colonoscopy with propofol (N/A, 12/20/2016); Esophagogastroduodenoscopy (egd) with propofol (N/A, 04/27/2017); Coronary angioplasty; Coronary artery bypass graft (2002); Total hip arthroplasty (Left, 02/24/2021); Colonoscopy with propofol (N/A, 01/19/2022); Joint replacement; and Esophagogastroduodenoscopy (N/A, 03/15/2023). Family: family history includes Arthritis in her sister, sister, and sister; Heart disease in her sister.  Laboratory Chemistry Profile   Renal Lab Results  Component Value Date   BUN 22 09/02/2023   CREATININE 1.52 (H) 09/02/2023   LABCREA 159.2 10/28/2022   BCR 17 06/07/2023   GFRAA 39 (L) 07/25/2020   GFRNONAA 37 (L) 09/02/2023    Hepatic Lab Results  Component Value Date   AST 14 06/07/2023   ALT 11 06/07/2023   ALBUMIN 4.0 05/29/2021   ALKPHOS 67 05/29/2021   LIPASE 37 09/19/2018    Electrolytes Lab Results  Component Value Date   NA 139 09/02/2023   K 4.3 09/02/2023   CL 107 09/02/2023   CALCIUM 8.7 (L) 09/02/2023    Bone No results found for: "VD25OH", "VD125OH2TOT", "HY8657QI6", "NG2952WU1", "25OHVITD1", "25OHVITD2", "25OHVITD3", "TESTOFREE", "TESTOSTERONE"  Inflammation (CRP: Acute Phase) (ESR: Chronic Phase) No results found for: "CRP", "ESRSEDRATE", "LATICACIDVEN"       Note: Above Lab results reviewed.  Recent Imaging Review  DG PAIN CLINIC C-ARM 1-60 MIN NO REPORT Fluoro was used, but no Radiologist interpretation will be provided.  Please refer to "NOTES" tab for provider progress note. Note: Reviewed        Physical  Exam  General appearance: Well nourished, well developed, and well hydrated. In no apparent acute distress Mental status: Alert, oriented x 3 (person, place, & time)       Respiratory: No evidence of acute respiratory distress Eyes: PERLA Vitals: BP (!) 140/63   Pulse 76   Temp (!) 97.2 F (36.2 C) (Temporal)   Resp 16   Ht 5\' 7"  (1.702 m)   Wt 247 lb (112 kg)   SpO2 100%   BMI 38.69 kg/m  BMI: Estimated body mass index is 38.69 kg/m as calculated from the following:   Height as of this encounter: 5\' 7"  (1.702 m).   Weight as of this encounter: 247 lb (112 kg). Ideal: Ideal body weight: 61.6 kg (135 lb 12.9 oz) Adjusted ideal body weight: 81.8 kg (180 lb 4.5 oz)  Assessment   Diagnosis Status  1. Chronic pain syndrome   2. Chronic radicular lumbar pain   3. Lumbar radiculopathy   4. Chronic painful diabetic neuropathy (HCC)   5. Spinal stenosis, lumbar region, with neurogenic claudication    Controlled Controlled Controlled   Updated Problems: No problems updated.  Plan of Care  Follow-up as needed to further discuss Sprint peripheral nerve stimulation No orders of the defined types were placed in this encounter.  Follow-up plan:   Return for patient will call to schedule F2F appt prn.      Recent Visits Date Type Provider Dept  09/12/23 Procedure visit Edward Jolly, MD [facility]  09/05/23 Procedure visit Edward Jolly, MD [facility]  07/26/23 Office Visit Edward Jolly, MD [facility]  Showing recent visits within past 90 days and meeting all other requirements Today's Visits Date Type Provider Dept  10/05/23 Office Visit Edward Jolly, MD [facility]  Showing today's visits and meeting all other  requirements Future Appointments No visits were found meeting these conditions. Showing future appointments within next 90 days and meeting all other requirements  I discussed the assessment and treatment plan with the  patient. The patient was provided an opportunity to ask questions and all were answered. The patient agreed with the plan and demonstrated an understanding of the instructions.  Patient advised to call back or seek an in-person evaluation if the symptoms or condition worsens.  Duration of encounter: .  Total time on encounter, as per AMA guidelines included both the face-to-face and non-face-to-face time personally spent by the physician and/or other qualified health care professional(s) on the day of the encounter (includes time in activities that require the physician or other qualified health care professional and does not include time in activities normally performed by clinical staff). Physician's time may include the following activities when performed: Preparing to see the patient (e.g., pre-charting review of records, searching for previously ordered imaging, lab work, and nerve conduction tests) Review of prior analgesic pharmacotherapies. Reviewing PMP Interpreting ordered tests (e.g., lab work, imaging, nerve conduction tests) Performing post-procedure evaluations, including interpretation of diagnostic procedures Obtaining and/or reviewing separately obtained history Performing a medically appropriate examination and/or evaluation Counseling and educating the patient/family/caregiver Ordering medications, tests, or procedures Referring and communicating with other health care professionals (when not separately reported) Documenting clinical information in the electronic or other health record Independently interpreting results (not separately reported) and communicating results to the patient/ family/caregiver Care coordination (not separately reported)  Note by: Edward Jolly, MD Date: 10/05/2023; Time: 3:13 PM

## 2023-10-10 ENCOUNTER — Encounter: Payer: Self-pay | Admitting: Internal Medicine

## 2023-10-11 ENCOUNTER — Encounter: Payer: Medicare HMO | Admitting: Speech Pathology

## 2023-10-11 ENCOUNTER — Encounter: Payer: Self-pay | Admitting: Nurse Practitioner

## 2023-10-11 ENCOUNTER — Encounter: Payer: Self-pay | Admitting: Internal Medicine

## 2023-10-13 ENCOUNTER — Encounter: Payer: Self-pay | Admitting: Internal Medicine

## 2023-10-13 ENCOUNTER — Encounter: Payer: Self-pay | Admitting: Orthopedic Surgery

## 2023-10-14 ENCOUNTER — Encounter: Payer: Self-pay | Admitting: Internal Medicine

## 2023-10-14 ENCOUNTER — Other Ambulatory Visit: Payer: Self-pay | Admitting: Internal Medicine

## 2023-10-14 ENCOUNTER — Telehealth: Payer: Self-pay | Admitting: Internal Medicine

## 2023-10-14 DIAGNOSIS — E1142 Type 2 diabetes mellitus with diabetic polyneuropathy: Secondary | ICD-10-CM

## 2023-10-14 MED ORDER — GABAPENTIN 300 MG PO CAPS
300.0000 mg | ORAL_CAPSULE | Freq: Every day | ORAL | 0 refills | Status: DC
Start: 2023-10-14 — End: 2023-10-24

## 2023-10-14 MED ORDER — GABAPENTIN 300 MG PO CAPS
300.0000 mg | ORAL_CAPSULE | Freq: Every day | ORAL | 0 refills | Status: DC
Start: 2023-10-14 — End: 2023-10-14

## 2023-10-14 NOTE — Telephone Encounter (Signed)
Copied from CRM [id number]. Topic: General - Other >> Oct 14, 2023 12:00 PM Dondra Prader E wrote: Reason for CRM: Pt called requesting to have her prescription for Gabapentin sent to [facility] pharmacy instead of [facility]. She gets it for free from [facility]

## 2023-10-14 NOTE — Telephone Encounter (Signed)
Resent

## 2023-10-17 DIAGNOSIS — R9431 Abnormal electrocardiogram [ECG] [EKG]: Secondary | ICD-10-CM | POA: Diagnosis not present

## 2023-10-17 DIAGNOSIS — Z79899 Other long term (current) drug therapy: Secondary | ICD-10-CM | POA: Diagnosis not present

## 2023-10-17 DIAGNOSIS — Z955 Presence of coronary angioplasty implant and graft: Secondary | ICD-10-CM | POA: Diagnosis not present

## 2023-10-17 DIAGNOSIS — I2510 Atherosclerotic heart disease of native coronary artery without angina pectoris: Secondary | ICD-10-CM | POA: Diagnosis not present

## 2023-10-17 DIAGNOSIS — I480 Paroxysmal atrial fibrillation: Secondary | ICD-10-CM | POA: Diagnosis not present

## 2023-10-17 DIAGNOSIS — E119 Type 2 diabetes mellitus without complications: Secondary | ICD-10-CM | POA: Diagnosis not present

## 2023-10-17 DIAGNOSIS — E669 Obesity, unspecified: Secondary | ICD-10-CM | POA: Diagnosis not present

## 2023-10-17 DIAGNOSIS — Z7982 Long term (current) use of aspirin: Secondary | ICD-10-CM | POA: Diagnosis not present

## 2023-10-17 DIAGNOSIS — Z951 Presence of aortocoronary bypass graft: Secondary | ICD-10-CM | POA: Diagnosis not present

## 2023-10-18 ENCOUNTER — Encounter: Payer: Medicare HMO | Admitting: Speech Pathology

## 2023-10-19 ENCOUNTER — Inpatient Hospital Stay: Admission: RE | Admit: 2023-10-19 | Payer: Medicare HMO | Source: Ambulatory Visit

## 2023-10-21 ENCOUNTER — Encounter: Payer: Self-pay | Admitting: Internal Medicine

## 2023-10-24 ENCOUNTER — Other Ambulatory Visit: Payer: Self-pay | Admitting: Internal Medicine

## 2023-10-24 DIAGNOSIS — E1142 Type 2 diabetes mellitus with diabetic polyneuropathy: Secondary | ICD-10-CM

## 2023-10-24 MED ORDER — GABAPENTIN 300 MG PO CAPS
300.0000 mg | ORAL_CAPSULE | Freq: Every day | ORAL | 0 refills | Status: DC
Start: 2023-10-24 — End: 2023-11-17

## 2023-10-25 ENCOUNTER — Encounter: Payer: Medicare HMO | Admitting: Speech Pathology

## 2023-10-25 NOTE — Progress Notes (Unsigned)
   Acute Office Visit  Subjective:     Patient ID: Hailey Howard, female    DOB: 06-18-1956, 67 y.o.   MRN: [id number]  No chief complaint on file.   HPI Patient is in today for fatigue.   FATIGUE Duration:  {Blank single:19197::"chronic","days","weeks","months"} Severity: {Blank single:19197::"mild","moderate","severe","1/10","2/10","3/10","4/10","5/10","6/10","7/10","8/10","9/10","10/10"}  Onset: {Blank single:19197::"sudden","gradual"} Context when symptoms started:  {Blank single:19197::"none","unknown"} Symptoms improve with rest: {Blank single:19197::"yes","no"}  Depressive symptoms: {Blank single:19197::"yes","no"} Stress/anxiety: {Blank single:19197::"yes","no"} Insomnia: {Blank single:19197::"yes","no"} {Blank single:19197::"hard to fall asleep","hard to stay asleep"} Snoring: {Blank single:19197::"yes","no"} Observed apnea by bed partner: {Blank single:19197::"yes","no"} Daytime hypersomnolence:{Blank single:19197::"yes","no"} Wakes feeling refreshed: {Blank single:19197::"yes","no"} History of sleep study: {Blank single:19197::"yes","no"} Dysnea on exertion:  {Blank single:19197::"yes","no"} Orthopnea/PND: {Blank single:19197::"yes","no"} Chest pain: {Blank single:19197::"yes","no"} Chronic cough: {Blank single:19197::"yes","no"} Lower extremity edema: {Blank single:19197::"yes","no"} Arthralgias:{Blank single:19197::"yes","no"} Myalgias: {Blank single:19197::"yes","no"} Weakness: {Blank single:19197::"yes","no"} Rash: {Blank single:19197::"yes","no"}   ROS      Objective:    There were no vitals taken for this visit. {Vitals History (Optional):23777}  Physical Exam  No results found for any visits on 10/27/23.      Assessment & Plan:   Problem List Items Addressed This Visit   None   No orders of the defined types were placed in this encounter.   No follow-ups on file.  Margarita Mail, DO

## 2023-10-27 ENCOUNTER — Ambulatory Visit (INDEPENDENT_AMBULATORY_CARE_PROVIDER_SITE_OTHER): Payer: Medicare HMO | Admitting: Internal Medicine

## 2023-10-27 ENCOUNTER — Encounter: Payer: Self-pay | Admitting: Internal Medicine

## 2023-10-27 VITALS — BP 124/86 | HR 98 | Temp 98.4°F | Resp 18 | Ht 67.0 in | Wt 248.7 lb

## 2023-10-27 DIAGNOSIS — R5382 Chronic fatigue, unspecified: Secondary | ICD-10-CM

## 2023-10-27 DIAGNOSIS — E890 Postprocedural hypothyroidism: Secondary | ICD-10-CM | POA: Diagnosis not present

## 2023-10-27 DIAGNOSIS — E559 Vitamin D deficiency, unspecified: Secondary | ICD-10-CM | POA: Diagnosis not present

## 2023-10-28 ENCOUNTER — Ambulatory Visit
Admission: RE | Admit: 2023-10-28 | Discharge: 2023-10-28 | Disposition: A | Discharge status: Home / Self Care | Payer: Medicare HMO | Source: Ambulatory Visit | Attending: Orthopedic Surgery | Admitting: Orthopedic Surgery

## 2023-10-28 DIAGNOSIS — M25552 Pain in left hip: Secondary | ICD-10-CM | POA: Diagnosis not present

## 2023-10-28 DIAGNOSIS — Z96642 Presence of left artificial hip joint: Secondary | ICD-10-CM

## 2023-10-28 DIAGNOSIS — M7062 Trochanteric bursitis, left hip: Secondary | ICD-10-CM

## 2023-10-28 LAB — B12 AND FOLATE PANEL
Folate: 18.7 ng/mL
Vitamin B-12: 427 pg/mL (ref 200–1100)

## 2023-10-28 LAB — TSH: TSH: 0.11 m[IU]/L — ABNORMAL LOW (ref 0.40–4.50)

## 2023-10-28 LAB — VITAMIN D 25 HYDROXY (VIT D DEFICIENCY, FRACTURES): Vit D, 25-Hydroxy: 47 ng/mL (ref 30–100)

## 2023-11-01 ENCOUNTER — Encounter: Payer: Medicare HMO | Admitting: Speech Pathology

## 2023-11-02 ENCOUNTER — Telehealth: Payer: Self-pay | Admitting: Internal Medicine

## 2023-11-02 NOTE — Telephone Encounter (Signed)
Patient called and states that she had an appointment with Dr Caralee Ates on 12.5.2024 and had her thyroid checked and she wanted to know if Dr Caralee Ates, PCP, could call Dr Pricilla Handler office (her endocrinologist) and give them her Thyroid level results?  Please advise and follow back up with the patient at phone # [phone].   Dr Tedd Sias (Endocrinologist) [address] [place],  [place], [place] [address] Phone: [phone]

## 2023-11-03 NOTE — Telephone Encounter (Signed)
Labs faxed/pt notified

## 2023-11-08 ENCOUNTER — Encounter: Payer: Medicare HMO | Admitting: Speech Pathology

## 2023-11-12 DIAGNOSIS — E1165 Type 2 diabetes mellitus with hyperglycemia: Secondary | ICD-10-CM | POA: Diagnosis not present

## 2023-11-15 ENCOUNTER — Other Ambulatory Visit: Payer: Self-pay | Admitting: Internal Medicine

## 2023-11-15 DIAGNOSIS — E1142 Type 2 diabetes mellitus with diabetic polyneuropathy: Secondary | ICD-10-CM

## 2023-11-17 NOTE — Telephone Encounter (Signed)
Requested by interface surescripts. Future visit in 1 week.  Requested Prescriptions  Pending Prescriptions Disp Refills   gabapentin (NEURONTIN) 300 MG capsule [Pharmacy Med Name: Gabapentin Oral Capsule 300 MG] 90 capsule 0    Sig: TAKE 1 CAPSULE (300 MG TOTAL) BY MOUTH DAILY.     Neurology: Anticonvulsants - gabapentin Failed - 11/17/2023 12:51 PM      Failed - Cr in normal range and within 360 days    Creatinine  Date Value Ref Range Status  09/02/2023 1.52 (H) 0.44 - 1.00 mg/dL Final   Creat  Date Value Ref Range Status  06/07/2023 1.98 (H) 0.50 - 1.05 mg/dL Final   Creatinine, Urine  Date Value Ref Range Status  10/28/2022 159.2  Final         Passed - Completed PHQ-2 or PHQ-9 in the last 360 days      Passed - Valid encounter within last 12 months    Recent Outpatient Visits           3 weeks ago Chronic fatigue   [facility] Margarita Mail, DO   3 months ago [facility]   [facility] Margarita Mail, DO   4 months ago [facility]   [facility] Margarita Mail, DO   5 months ago Chronic RUQ pain   [facility] Margarita Mail, DO   5 months ago Hypertension, unspecified type   [facility] Margarita Mail, DO       Future Appointments             In 1 week Margarita Mail, DO [facility]

## 2023-11-23 ENCOUNTER — Encounter: Payer: Self-pay | Admitting: Internal Medicine

## 2023-11-29 ENCOUNTER — Other Ambulatory Visit: Payer: Self-pay | Admitting: Internal Medicine

## 2023-11-29 DIAGNOSIS — Z1231 Encounter for screening mammogram for malignant neoplasm of breast: Secondary | ICD-10-CM

## 2023-11-30 ENCOUNTER — Other Ambulatory Visit: Payer: Self-pay

## 2023-11-30 ENCOUNTER — Ambulatory Visit: Payer: Medicare PPO | Admitting: Internal Medicine

## 2023-11-30 ENCOUNTER — Encounter: Payer: Self-pay | Admitting: Internal Medicine

## 2023-11-30 VITALS — BP 134/82 | HR 66 | Temp 98.0°F | Resp 16 | Ht 67.0 in | Wt 247.7 lb

## 2023-11-30 DIAGNOSIS — I10 Essential (primary) hypertension: Secondary | ICD-10-CM | POA: Diagnosis not present

## 2023-11-30 DIAGNOSIS — I2510 Atherosclerotic heart disease of native coronary artery without angina pectoris: Secondary | ICD-10-CM | POA: Diagnosis not present

## 2023-11-30 DIAGNOSIS — E890 Postprocedural hypothyroidism: Secondary | ICD-10-CM

## 2023-11-30 DIAGNOSIS — E1142 Type 2 diabetes mellitus with diabetic polyneuropathy: Secondary | ICD-10-CM | POA: Diagnosis not present

## 2023-11-30 DIAGNOSIS — I480 Paroxysmal atrial fibrillation: Secondary | ICD-10-CM

## 2023-11-30 DIAGNOSIS — D631 Anemia in chronic kidney disease: Secondary | ICD-10-CM | POA: Diagnosis not present

## 2023-11-30 DIAGNOSIS — Z85850 Personal history of malignant neoplasm of thyroid: Secondary | ICD-10-CM

## 2023-11-30 DIAGNOSIS — N183 Chronic kidney disease, stage 3 unspecified: Secondary | ICD-10-CM | POA: Diagnosis not present

## 2023-11-30 MED ORDER — GABAPENTIN 300 MG PO CAPS: 300.0000 mg | ORAL_CAPSULE | Freq: Every day | ORAL | 1 refills | Status: DC

## 2023-11-30 MED ORDER — ATORVASTATIN CALCIUM 40 MG PO TABS: 40.0000 mg | ORAL_TABLET | Freq: Every day | ORAL | 1 refills | Status: DC

## 2023-11-30 NOTE — Assessment & Plan Note (Signed)
 Following with Nephrology, labs reviewed from October. Cannot tolerate SGLT-2's.

## 2023-11-30 NOTE — Assessment & Plan Note (Addendum)
 Following with Endocrinology, reviewed labs from 10/24, A1c well controlled and no medications changed. Refilled Gabapentin 300 mg once daily.

## 2023-11-30 NOTE — Assessment & Plan Note (Signed)
 Blood pressure stable here today, no changes made to medications and appropriate refills sent to pharmacy.

## 2023-11-30 NOTE — Assessment & Plan Note (Signed)
 Reviewed cholesterol panel with the patient from October, well controlled. Continue statin and aspirin.

## 2023-11-30 NOTE — Assessment & Plan Note (Signed)
 Following with Cardiology at [facility].

## 2023-11-30 NOTE — Assessment & Plan Note (Signed)
 On Ozempic, working on dietary change.

## 2023-11-30 NOTE — Progress Notes (Signed)
 Established Patient Office Visit  Subjective   Patient ID: Hailey Howard, female    DOB: [birth year]  Age: 68 y.o. MRN: [id number]  Chief Complaint  Patient presents with   Medical Management of Chronic Issues    6 month recheck    HPI  Patient is here for follow up on chronic medical conditions. She's been doing well since her LOV.  Hypertension/Atrial Fibrillation: -Medications: Amlodipine  5 mg, Telmisartan 80 mg, Spironolactone  50 mg, Sotalol  80 mg BID, Torsemide 10 mg -Patient is compliant with above medications and reports no side effects. -Denies any SOB, CP, vision changes, LE edema or symptoms of hypotension -[facility], last seen 10/17/23 -Echo 04/13/22 normal   HLD/CAD/Hx of CABG: -Medications: Lipitor 40 mg, aspirin  -Patient is compliant with above medications and reports no side effects.  -Last lipid panel: 10/24 TC 152, LDL 87, HDL 31.2, triglycerides 170 -Hx of CABG in 2002, PCI stent 2005 - she developed post-operative A.Fib after CABG in 2002  Diabetes, Type 2: -Last A1c 6.7% 10/24 -Medications: Metformin  1000 mg BID, Ozempic 0.50 mg, Lantus  35 units at night and Fiasp  12 units at breakfast, 16 units at lunch and 8 units at dinner and Gabapentin  300 mg once daily  -Follows with Endocrinology, last seen in October 2024 -Statin: yes -PNA vaccine: UTD -Denies symptoms of polyuria, polydipsia, numbness extremities, foot ulcers/trauma.   Hx of Papillary Carcinoma of the Thyroid : -Currently on Levothyroxine  137 mcg -TSH 12/24 0.11 -Partial thyroidectomy in 2009 due to nodules, then total thyroidectomy in 2021 after cancer diagnosis   CKD3b/AoCD: -Last creatinine 10/24 1.52, GFR 37 -Follows with Nephrology  -Lillis Needle but caused frequent yeast infections so this was discontinued  -Also follows with Heme/Onc, last seen 02/18/23 -Does take iron  PO, had IV iron  which did not go well  -Currently takes Vitamin D  1000  IU  GERD: -Currently on Prilosec 40 mg, symptoms well controlled  IBS: -Currently on Linzess 72 mcg taking every other day  Health Maintenance: -Blood work UTD -Mammogram 2/24, Birads-1, already scheduled 2/10 -Colonoscopy 2/23, repeat in 5 years   Patient Active Problem List   Diagnosis Date Noted   History of thyroid  cancer 11/30/2023   Lumbar radiculopathy 07/26/2023   Spinal stenosis, lumbar region, with neurogenic claudication 07/26/2023   Chronic painful diabetic neuropathy (HCC) 07/26/2023   Chronic pain syndrome 07/26/2023   History of iron  deficiency anemia 02/18/2023   S/P hip replacement 02/24/2021   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   Laryngospasms 10/21/2020   Hoarseness 10/21/2020   Dysphagia 10/21/2020   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/05/2020   S/P total thyroidectomy 03/14/2020   Multinodular goiter 03/04/2020   Chronic abdominal pain 11/29/2019   Anemia of chronic kidney failure, stage 3 (moderate) (HCC) 08/17/2019   Abdominal pain, recurrent 07/01/2019   Irritable bowel syndrome with constipation 07/01/2019   IDA (iron  deficiency anemia) 06/29/2019   Type 2 diabetes mellitus with both eyes affected by mild nonproliferative retinopathy without macular edema, with long-term current use of insulin  (HCC) 09/12/2018   Type 2 diabetes mellitus with vascular disease (HCC) 09/12/2018   Uncontrolled type 2 diabetes mellitus with hyperglycemia (HCC) 09/12/2018   Morbid obesity (HCC) 09/12/2018   Elevation of muscle enzyme 08/30/2018   Elevated erythrocyte sedimentation rate 08/30/2018   Myalgia 08/15/2018   Chronic pain of both shoulders 08/15/2018   Sacroiliitis (HCC) 02/15/2018   Schatzki's ring 06/21/2017   Chronic superficial gastritis without bleeding 06/21/2017   Gastroesophageal reflux disease without esophagitis 04/20/2017  Coronary artery disease 08/02/2016   Hyperlipidemia 08/02/2016   Hypertension 08/02/2016   Tachycardia 08/02/2016    Mesenteric mass 08/02/2016   Bursitis of left shoulder 05/07/2016   Rotator cuff tendinitis, left 05/07/2016   Impingement syndrome of left shoulder 05/07/2016   Abnormal ECG 12/24/2015   DM type 2 with diabetic peripheral neuropathy (HCC) 09/19/2015   Insulin -treated type 2 diabetes mellitus (HCC) 09/19/2015   Uncontrolled type 2 diabetes mellitus with hyperglycemia, with long-term current use of insulin  (HCC) 09/19/2015   Chronic kidney disease, stage III (moderate) (HCC) 09/10/2015   Hypokalemia 09/10/2015   Bundle branch block 09/08/2015   Edema 06/16/2015   Atrial fibrillation (HCC) 05/01/2014   A-fib (HCC) 05/01/2014   Hypothyroidism 05/01/2014   Past Medical History:  Diagnosis Date   Anemia    iron  anemia, now getting infusions   Anemia of chronic kidney failure, stage 3 (moderate) (HCC) 08/17/2019   Arthritis    Bradycardia    Cancer (HCC)    Cataract    CKD (chronic kidney disease), stage III (HCC)    Coronary artery disease    Coronary artery dissection 05/03/2001   LAD during diagnostic PCI at [facility]   Diabetes mellitus without complication (HCC)    DM type 2 with diabetic peripheral neuropathy (HCC) 09/19/2015   Dysphagia    Dysrhythmia    Exertional dyspnea    GERD (gastroesophageal reflux disease)    Headache    History of hiatal hernia    History of shingles    HLD (hyperlipidemia)    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   SVG-OM2, SVG-D2, LIMA-LAD   Hx of thyroid  cancer    s/p thyroidectomy   Hypertension    Hypothyroidism 05/01/2014   IBS (irritable bowel syndrome)    Mesenteric mass    Peripheral vascular disease (HCC)    Right BBB/left ant fasc block    Rotator cuff tendinitis, left    Thyroid  disease    Unspecified atrial fibrillation (HCC)    Past Surgical History:  Procedure Laterality Date   ABDOMINAL HYSTERECTOMY     CHOLECYSTECTOMY     COLONOSCOPY WITH PROPOFOL  N/A 12/20/2016   Procedure: COLONOSCOPY WITH PROPOFOL ;  Surgeon: Lamar ONEIDA Holmes, MD;   Location: [facility] ENDOSCOPY;  Service: Endoscopy;  Laterality: N/A;   COLONOSCOPY WITH PROPOFOL  N/A 01/19/2022   Procedure: COLONOSCOPY WITH PROPOFOL ;  Surgeon: Maryruth Ole ONEIDA, MD;  Location: [facility] ENDOSCOPY;  Service: Endoscopy;  Laterality: N/A;   CORONARY ANGIOPLASTY     stents   CORONARY ARTERY BYPASS GRAFT  2002   3 vessels   ESOPHAGOGASTRODUODENOSCOPY N/A 03/15/2023   Procedure: ESOPHAGOGASTRODUODENOSCOPY (EGD);  Surgeon: Maryruth Ole ONEIDA, MD;  Location: [facility] ENDOSCOPY;  Service: Endoscopy;  Laterality: N/A;   ESOPHAGOGASTRODUODENOSCOPY (EGD) WITH PROPOFOL  N/A 04/27/2017   Procedure: ESOPHAGOGASTRODUODENOSCOPY (EGD) WITH PROPOFOL ;  Surgeon: Holmes Lamar ONEIDA, MD;  Location: [facility] ENDOSCOPY;  Service: Endoscopy;  Laterality: N/A;   EUS N/A 04/24/2015   Procedure: ESOPHAGEAL ENDOSCOPIC ULTRASOUND (EUS) RADIAL;  Surgeon: Ozell Eva Montes, MD;  Location: [facility] ENDOSCOPY;  Service: Endoscopy;  Laterality: N/A;   JOINT REPLACEMENT     THYROIDECTOMY, PARTIAL     TOTAL HIP ARTHROPLASTY Left 02/24/2021   Procedure: TOTAL HIP ARTHROPLASTY ANTERIOR APPROACH;  Surgeon: Kathlynn Ozell, MD;  Location: [facility] ORS;  Service: Orthopedics;  Laterality: Left;   Social History   Tobacco Use   Smoking status: Never   Smokeless tobacco: Never  Vaping Use   Vaping status: Never Used  Substance Use Topics  Alcohol use: No   Drug use: No   Social History   Socioeconomic History   Marital status: Married    Spouse name: Virgil   Number of children: Not on file   Years of education: Not on file   Highest education level: Associate degree: occupational, scientist, product/process development, or vocational program  Occupational History   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 Continues to work part time.   Insulin  pump   Social Drivers of Health   Financial Resource Strain: Medium Risk (11/29/2023)   Overall Financial Resource Strain (CARDIA)    Difficulty of Paying Living Expenses: Somewhat hard  Food Insecurity: No Food Insecurity (11/29/2023)   Hunger Vital Sign    Worried About Running Out of Food in the Last Year: Never true    Ran Out of Food in the Last Year: Never true  Transportation Needs: No Transportation Needs (11/29/2023)   PRAPARE - Administrator, Civil Service (Medical): No    Lack of Transportation (Non-Medical): No  Physical Activity: Insufficiently Active (11/29/2023)   Exercise Vital Sign    Days of Exercise per Week: 3 days    Minutes of Exercise per Session: 20 min  Stress: No Stress Concern Present (11/29/2023)   [employer] of Occupational Health - Occupational Stress Questionnaire    Feeling of Stress : Not at all  Social Connections: Socially Integrated (11/29/2023)   Social Connection and Isolation Panel [NHANES]    Frequency of Communication with Friends and Family: Three times a week    Frequency of Social Gatherings with Friends and Family: Patient declined    Attends Religious Services: 1 to 4 times per year    Active Member of [employer] or Organizations: Yes    Attends Banker Meetings: 1 to 4 times per year    Marital Status: Married  Catering Manager Violence: Not At Risk (09/08/2023)   Humiliation, Afraid, Rape, and Kick questionnaire    Fear of Current or Ex-Partner: No    Emotionally Abused: No    Physically Abused: No    Sexually Abused: No   Family Status  Relation Name Status   Sister Ronal Minus (Not Specified)   Sister Sherrilyn Minus (Not Specified)   Sister Lanell Chancy (Not Specified)   Neg Hx  (Not Specified)  No partnership data on file    Family History  Problem Relation Age of Onset   Arthritis Sister    Heart disease Sister    Arthritis Sister    Arthritis Sister    Breast cancer Neg Hx    Allergies  Allergen Reactions   Other Anaphylaxis, Hives and Itching    Iv dye  Confirmed with pt over the phone on 08/01/15    Iodinated Contrast Media Hives   Oxycodone -Acetaminophen  Hives   Etodolac Itching      Review of Systems  All other systems reviewed and are negative.     Objective:     BP 134/82 (Cuff Size: Large)   Pulse 66   Temp 98 F (36.7 C) (Oral)  Resp 16   Ht 5' 7 (1.702 m)   Wt 247 lb 11.2 oz (112.4 kg)   BMI 38.80 kg/m  BP Readings from Last 3 Encounters:  11/30/23 134/82  10/27/23 124/86  10/05/23 (!) 140/63   Wt Readings from Last 3 Encounters:  11/30/23 247 lb 11.2 oz (112.4 kg)  10/27/23 248 lb 11.2 oz (112.8 kg)  10/05/23 247 lb (112 kg)      Physical Exam Constitutional:      Appearance: Normal appearance.  HENT:     Head: Normocephalic and atraumatic.     Mouth/Throat:     Mouth: Mucous membranes are moist.     Pharynx: Oropharynx is clear.  Eyes:     Extraocular Movements: Extraocular movements intact.     Conjunctiva/sclera: Conjunctivae normal.     Pupils: Pupils are equal, round, and reactive to light.  Neck:     Comments: No thyromegaly  Cardiovascular:     Rate and Rhythm: Normal rate and regular rhythm.  Pulmonary:     Effort: Pulmonary effort is normal.     Breath sounds: Normal breath sounds.  Musculoskeletal:     Cervical back: No tenderness.  Lymphadenopathy:     Cervical: No cervical adenopathy.  Skin:    General: Skin is warm and dry.  Neurological:     General: No focal deficit present.     Mental Status: She is alert. Mental status is at baseline.  Psychiatric:        Mood and Affect: Mood normal.        Behavior: Behavior normal.      Results for orders placed or performed in visit on 11/30/23  Microalbumin / creatinine urine ratio   Result Value Ref Range   Microalb Creat Ratio 7.0   Hemoglobin A1c  Result Value Ref Range   Hemoglobin A1C 6.7     Last CBC Lab Results  Component Value Date   WBC 8.2 09/02/2023   HGB 10.4 (L) 09/02/2023   HCT 33.5 (L) 09/02/2023   MCV 89.3 09/02/2023   MCH 27.7 09/02/2023   RDW 13.7 09/02/2023   PLT 313 09/02/2023   Last metabolic panel Lab Results  Component Value Date   GLUCOSE 129 (H) 09/02/2023   NA 139 09/02/2023   K 4.3 09/02/2023   CL 107 09/02/2023   CO2 25 09/02/2023   BUN 22 09/02/2023   CREATININE 1.52 (H) 09/02/2023   GFRNONAA 37 (L) 09/02/2023   CALCIUM  8.7 (L) 09/02/2023   PROT 6.9 06/07/2023   ALBUMIN 4.0 05/29/2021   LABGLOB 3.2 05/11/2019   BILITOT 0.5 06/07/2023   ALKPHOS 67 05/29/2021   AST 14 06/07/2023   ALT 11 06/07/2023   ANIONGAP 7 09/02/2023   Last lipids No results found for: CHOL, HDL, LDLCALC, LDLDIRECT, TRIG, CHOLHDL Last hemoglobin A1c Lab Results  Component Value Date   HGBA1C 6.7 08/29/2023   Last thyroid  functions Lab Results  Component Value Date   TSH 0.11 (L) 10/27/2023   Last vitamin D  Lab Results  Component Value Date   VD25OH 47 10/27/2023   Last vitamin B12 and Folate Lab Results  Component Value Date   VITAMINB12 427 10/27/2023   FOLATE 18.7 10/27/2023      The ASCVD Risk score (Arnett DK, et al., 2019) failed to calculate for the following reasons:   Cannot find a previous HDL lab   Cannot find a previous total cholesterol lab    Assessment & Plan:  Hypertension, unspecified type  Assessment & Plan: Blood pressure stable here today, no changes made to medications and appropriate refills sent to pharmacy.     Paroxysmal atrial fibrillation [facility]) Assessment & Plan: Following with Cardiology at [facility].    Coronary artery disease involving native heart without angina pectoris, unspecified vessel or lesion type Assessment & Plan: Reviewed cholesterol panel with the patient from  October, well controlled. Continue statin and aspirin .   Orders: -     Atorvastatin  Calcium ; Take 1 tablet (40 mg total) by mouth daily at 12 noon.  Dispense: 90 tablet; Refill: 1  DM type 2 with diabetic peripheral neuropathy [facility]) Assessment & Plan: Following with Endocrinology, reviewed labs from 10/24, A1c well controlled and no medications changed. Refilled Gabapentin  300 mg once daily.   Orders: -     Gabapentin ; Take 1 capsule (300 mg total) by mouth daily.  Dispense: 90 capsule; Refill: 1  Morbid obesity (HCC) Assessment & Plan: On Ozempic, working on dietary change.    History of thyroid  cancer Assessment & Plan: Following with Endocrinology, on Levothyroxine .    Postoperative hypothyroidism Assessment & Plan: Labs up to date, currently on Levothyroxine  137 mcg.    Anemia of chronic kidney failure, stage 3 (moderate) (HCC) Assessment & Plan: Following with Nephrology, labs reviewed from October. Cannot tolerate SGLT-2's.       Return in about 6 months (around 05/29/2024).    Sharyle Fischer, DO

## 2023-11-30 NOTE — Assessment & Plan Note (Signed)
 Labs up to date, currently on Levothyroxine 137 mcg.

## 2023-11-30 NOTE — Assessment & Plan Note (Signed)
 Following with Endocrinology, on Levothyroxine.

## 2023-12-16 DIAGNOSIS — E890 Postprocedural hypothyroidism: Secondary | ICD-10-CM | POA: Diagnosis not present

## 2023-12-16 DIAGNOSIS — E785 Hyperlipidemia, unspecified: Secondary | ICD-10-CM | POA: Diagnosis not present

## 2023-12-16 DIAGNOSIS — E1169 Type 2 diabetes mellitus with other specified complication: Secondary | ICD-10-CM | POA: Diagnosis not present

## 2023-12-22 DIAGNOSIS — E785 Hyperlipidemia, unspecified: Secondary | ICD-10-CM | POA: Diagnosis not present

## 2023-12-22 DIAGNOSIS — E1142 Type 2 diabetes mellitus with diabetic polyneuropathy: Secondary | ICD-10-CM | POA: Diagnosis not present

## 2023-12-22 DIAGNOSIS — E1169 Type 2 diabetes mellitus with other specified complication: Secondary | ICD-10-CM | POA: Diagnosis not present

## 2023-12-22 DIAGNOSIS — E1122 Type 2 diabetes mellitus with diabetic chronic kidney disease: Secondary | ICD-10-CM | POA: Diagnosis not present

## 2023-12-22 DIAGNOSIS — E1129 Type 2 diabetes mellitus with other diabetic kidney complication: Secondary | ICD-10-CM | POA: Diagnosis not present

## 2023-12-22 DIAGNOSIS — E890 Postprocedural hypothyroidism: Secondary | ICD-10-CM | POA: Diagnosis not present

## 2023-12-22 DIAGNOSIS — Z85850 Personal history of malignant neoplasm of thyroid: Secondary | ICD-10-CM | POA: Diagnosis not present

## 2023-12-22 DIAGNOSIS — E1159 Type 2 diabetes mellitus with other circulatory complications: Secondary | ICD-10-CM | POA: Diagnosis not present

## 2023-12-22 DIAGNOSIS — E113293 Type 2 diabetes mellitus with mild nonproliferative diabetic retinopathy without macular edema, bilateral: Secondary | ICD-10-CM | POA: Diagnosis not present

## 2023-12-26 ENCOUNTER — Encounter: Payer: Self-pay | Admitting: Internal Medicine

## 2023-12-28 DIAGNOSIS — M7062 Trochanteric bursitis, left hip: Secondary | ICD-10-CM | POA: Diagnosis not present

## 2024-01-02 ENCOUNTER — Ambulatory Visit
Admission: RE | Admit: 2024-01-02 | Discharge: 2024-01-02 | Disposition: A | Discharge status: Home / Self Care | Payer: Medicare PPO | Source: Ambulatory Visit | Attending: Internal Medicine | Admitting: Internal Medicine

## 2024-01-02 DIAGNOSIS — Z1231 Encounter for screening mammogram for malignant neoplasm of breast: Secondary | ICD-10-CM | POA: Insufficient documentation

## 2024-01-04 ENCOUNTER — Encounter: Payer: Self-pay | Admitting: Internal Medicine

## 2024-01-04 DIAGNOSIS — M7062 Trochanteric bursitis, left hip: Secondary | ICD-10-CM | POA: Diagnosis not present

## 2024-01-10 DIAGNOSIS — I10 Essential (primary) hypertension: Secondary | ICD-10-CM | POA: Diagnosis not present

## 2024-01-10 DIAGNOSIS — R801 Persistent proteinuria, unspecified: Secondary | ICD-10-CM | POA: Diagnosis not present

## 2024-01-10 DIAGNOSIS — N1832 Chronic kidney disease, stage 3b: Secondary | ICD-10-CM | POA: Diagnosis not present

## 2024-01-10 DIAGNOSIS — E875 Hyperkalemia: Secondary | ICD-10-CM | POA: Diagnosis not present

## 2024-01-12 ENCOUNTER — Encounter: Payer: Self-pay | Admitting: Internal Medicine

## 2024-01-16 DIAGNOSIS — I10 Essential (primary) hypertension: Secondary | ICD-10-CM | POA: Diagnosis not present

## 2024-01-16 DIAGNOSIS — N1832 Chronic kidney disease, stage 3b: Secondary | ICD-10-CM | POA: Diagnosis not present

## 2024-01-24 ENCOUNTER — Other Ambulatory Visit: Payer: Self-pay | Admitting: Orthopedic Surgery

## 2024-01-30 ENCOUNTER — Other Ambulatory Visit: Payer: Self-pay

## 2024-01-30 ENCOUNTER — Encounter
Admission: RE | Admit: 2024-01-30 | Discharge: 2024-01-30 | Disposition: A | Discharge status: Home / Self Care | Source: Ambulatory Visit | Attending: Orthopedic Surgery | Admitting: Orthopedic Surgery

## 2024-01-30 VITALS — Ht 67.0 in | Wt 245.0 lb

## 2024-01-30 DIAGNOSIS — E1159 Type 2 diabetes mellitus with other circulatory complications: Secondary | ICD-10-CM

## 2024-01-30 NOTE — Patient Instructions (Addendum)
 Your procedure is scheduled on: Tuesday 02/07/24 To find out your arrival time, please call [phone] between 1PM - 3PM on: Monday 02/06/24   Report to the Registration Desk on the 1st floor of the [facility]. FREE Valet parking is available.  If your arrival time is 6:00 am, do not arrive before that time as the [facility] entrance doors do not open until 6:00 am.  REMEMBER: Instructions that are not followed completely may result in serious medical risk, up to and including death; or upon the discretion of your surgeon and anesthesiologist your surgery may need to be rescheduled.  Do not eat food after midnight the night before surgery.  No gum chewing or hard candies.  You may however, drink CLEAR liquids up to 2 hours before you are scheduled to arrive for your surgery. Do not drink anything within 2 hours of your scheduled arrival time.  Clear liquids include: - water   Type 1 and Type 2 diabetics should only drink water.  In addition, your doctor has ordered for you to drink the provided:   Gatorade G2 Drinking this carbohydrate drink up to two hours before surgery helps to reduce insulin resistance and improve patient outcomes. Please complete drinking 2 hours before scheduled arrival time.  One week prior to surgery: Stop Anti-inflammatories (NSAIDS) such as Advil, Aleve, Ibuprofen, Motrin, Naproxen, Naprosyn and Aspirin based products such as Excedrin, Goody's Powder, BC Powder. You may however, continue to take Tylenol if needed for pain up until the day of surgery.  Stop ANY OVER THE COUNTER supplements and vitamins until after surgery.  Continue taking all prescribed medications with the exception of the following: Ozempic hold until after surgery. Metformin last dose will be Saturday 02/04/24.  **Follow guidelines for insulin and diabetes medications** Monday night 02/06/24 cut your bedtime insulin in half to 14 units.Marland Kitchen  TAKE ONLY THESE MEDICATIONS THE MORNING OF  SURGERY WITH A SIP OF WATER:  amLODipine (NORVASC) 2.5 MG tablet  atorvastatin (LIPITOR) 40 MG tablet  gabapentin (NEURONTIN) 300 MG capsule  levothyroxine (SYNTHROID) 137 MCG tablet  omeprazole (PRILOSEC) 40 MG capsule Antacid (take one the night before and one on the morning of surgery - helps to prevent nausea after surgery.)  No Alcohol for 24 hours before or after surgery.  No Smoking including e-cigarettes for 24 hours before surgery.  No chewable tobacco products for at least 6 hours before surgery.  No nicotine patches on the day of surgery.  Do not use any "recreational" drugs for at least a week (preferably 2 weeks) before your surgery.  Please be advised that the combination of cocaine and anesthesia may have negative outcomes, up to and including death. If you test positive for cocaine, your surgery will be cancelled.  On the morning of surgery brush your teeth with toothpaste and water, you may rinse your mouth with mouthwash if you wish. Do not swallow any toothpaste or mouthwash.  Use CHG Soap or wipes as directed on instruction sheet.  Do not wear lotions, powders, or perfumes.   Do not shave body hair from the neck down 48 hours before surgery.  Wear comfortable clothing (specific to your surgery type) to the hospital.  Do not wear jewelry, make-up, hairpins, clips or nail polish.  For welded (permanent) jewelry: bracelets, anklets, waist bands, etc.  Please have this removed prior to surgery.  If it is not removed, there is a chance that hospital personnel will need to cut it off on the  day of surgery. Contact lenses, hearing aids and dentures may not be worn into surgery.  Do not bring valuables to the hospital. [facility] is not responsible for any missing/lost belongings or valuables.   Notify your doctor if there is any change in your medical condition (cold, fever, infection).  If you are being discharged the day of surgery, you will not be allowed to  drive home. You will need a responsible individual to drive you home and stay with you for 24 hours after surgery.   If you are taking public transportation, you will need to have a responsible individual with you.  If you are being admitted to the hospital overnight, leave your suitcase in the car. After surgery it may be brought to your room.  In case of increased patient census, it may be necessary for you, the patient, to continue your postoperative care in the Same Day Surgery department.  After surgery, you can help prevent lung complications by doing breathing exercises.  Take deep breaths and cough every 1-2 hours. Your doctor may order a device called an Incentive Spirometer to help you take deep breaths. When coughing or sneezing, hold a pillow firmly against your incision with both hands. This is called "splinting." Doing this helps protect your incision. It also decreases belly discomfort.  Surgery Visitation Policy:  Patients undergoing a surgery or procedure may have two family members or support persons with them as long as the person is not COVID-19 positive or experiencing its symptoms.   Inpatient Visitation:    Visiting hours are 7 a.m. to 8 p.m. Up to four visitors are allowed at one time in a patient room. The visitors may rotate out with other people during the day. One designated support person (adult) may remain overnight.  Due to an increase in RSV and influenza rates and associated hospitalizations, children ages 29 and under will not be able to visit patients in [facility]. Masks continue to be strongly recommended.  Please call the Pre-admissions Testing Dept. at [phone] if you have any questions about these instructions.     Preparing for Surgery with CHLORHEXIDINE GLUCONATE (CHG) Soap  Chlorhexidine Gluconate (CHG) Soap  o An antiseptic cleaner that kills germs and bonds with the skin to continue killing germs even after washing  o  Used for showering the night before surgery and morning of surgery  Before surgery, you can play an important role by reducing the number of germs on your skin.  CHG (Chlorhexidine gluconate) soap is an antiseptic cleanser which kills germs and bonds with the skin to continue killing germs even after washing.  Please do not use if you have an allergy to CHG or antibacterial soaps. If your skin becomes reddened/irritated stop using the CHG.  1. Shower the NIGHT BEFORE SURGERY and the MORNING OF SURGERY with CHG soap.  2. If you choose to wash your hair, wash your hair first as usual with your normal shampoo.  3. After shampooing, rinse your hair and body thoroughly to remove the shampoo.  4. Use CHG as you would any other liquid soap. You can apply CHG directly to the skin and wash gently with a scrungie or a clean washcloth.  5. Apply the CHG soap to your body only from the neck down. Do not use on open wounds or open sores. Avoid contact with your eyes, ears, mouth, and genitals (private parts). Wash face and genitals (private parts) with your normal soap.  6. Wash  thoroughly, paying special attention to the area where your surgery will be performed.  7. Thoroughly rinse your body with warm water.  8. Do not shower/wash with your normal soap after using and rinsing off the CHG soap.  9. Pat yourself dry with a clean towel.  10. Wear clean pajamas to bed the night before surgery.  12. Place clean sheets on your bed the night of your first shower and do not sleep with pets.  13. Shower again with the CHG soap on the day of surgery prior to arriving at the hospital.  14. Do not apply any deodorants/lotions/powders.  15. Please wear clean clothes to the hospital.

## 2024-02-02 ENCOUNTER — Ambulatory Visit: Payer: Medicare HMO

## 2024-02-03 ENCOUNTER — Encounter: Payer: Self-pay | Admitting: Orthopedic Surgery

## 2024-02-03 NOTE — Progress Notes (Signed)
 Perioperative / Anesthesia Services  Pre-Admission Testing Clinical Review / Pre-Operative Anesthesia Consult  Date: 02/03/24  Patient Demographics:  Name: Hailey Howard DOB: [birth year] MRN:   [id number]  Planned Surgical Procedure(s):    Case: [id number] Date/Time: 02/07/24 0715   Procedure: RELEASE, ILIOTIBIAL BAND, ARTHROSCOPIC (Left: Hip) - Left hip endoscopic trochanteric bursectomy, IT band release, possible hip abductor repair   Anesthesia type: Choice   Diagnosis: Trochanteric bursitis of left hip [M70.62]   Pre-op diagnosis: Greater trochanteric bursitis of left hip M70.62   Location: [facility] OR ROOM 01 / [facility]   Surgeons: Signa Kell, MD      NOTE: Available PAT nursing documentation and vital signs have been reviewed. Clinical nursing staff has updated patient's PMH/PSHx, current medication list, and drug allergies/intolerances to ensure comprehensive history available to assist in medical decision making as it pertains to the aforementioned surgical procedure and anticipated anesthetic course. Extensive review of available clinical information personally performed. [place] PM[facility] and PSHx updated with any diagnoses/procedures that  may have been inadvertently omitted during his intake with the pre-admission testing department's nursing staff.  Clinical Discussion:  Hailey Howard is a 68 y.o. female who is submitted for pre-surgical anesthesia review and clearance prior to her undergoing the above procedure. Patient has never been a smoker in the past. Patient has never been a smoker. Pertinent PMH includes: CAD (s/p CABG x 3), coronary artery dissection, atrial fibrillation, bradycardia, bifascicular block (RBBB+ LAFB), SVT, PVD, aortic atherosclerosis, HTN, HLD, T2DM, postoperative hypothyroidism s/p total thyroidectomy for thyroid cancer, CKD-III, DOE, dysphagia, GERD (on daily PPI), hiatal hernia, muscle tension dysphonia, anemia (CKD and IDA), OA,  trochanteric bursitis of the hip.  Patient is followed by cardiology Hailey Gibbs, MD). She was last seen in the cardiology clinic on 10/17/2023; notes reviewed. At the time of her clinic visit, patient doing well overall from a cardiovascular perspective. Patient denied any chest pain, shortness of breath, PND, orthopnea, palpitations, significant peripheral edema, weakness, fatigue, vertiginous symptoms, or presyncope/syncope. Patient with a past medical history significant for cardiovascular diagnoses. Documented physical exam was grossly benign, providing no evidence of acute exacerbation and/or decompensation of the patient's known cardiovascular conditions.  Patient underwent diagnostic LEFT heart catheterization on 05/03/2001 revealing multivessel CAD; 95% proximal LCx, 95% mid LAD-1, 75% mid LAD-2, and 95% D2.  Difficulty encountered crossing the wire across the LAD lesion resulting in an LAD dissection.  Patient was emergently consulted by CVTS and ultimately taken for revascularization.  Patient underwent three-vessel revascularization on 05/03/2001.  LIMA-LAD, SVG-OM 2, and SVG-D2 bypass grafts were placed.  Diagnostic LEFT heart catheterization was performed on 02/12/2004 revealing multivessel CAD; 25% proximal RCA, 25% mid RCA, 95% RPDA, 25% LM, 25% proximal LCx, 95% mid LCx, 50% OM 3, 25% proximal LAD, 100% mid LAD, and 25% D2.  Bypass grafts with varying degrees of stenosis noted; 25% LIMA-LAD, 100% SVG-OM 2, and 50% SVG-D2.  PCI was subsequently performed placing a 3.0 x 13 mm Cypher stent to the mid LCx lesion yielding excellent angiographic result and TIMI-3 flow.  Most recent myocardial perfusion imaging study was performed on 04/13/2022 revealing a normal left ventricular systolic function with an EF of 65%.  There were no regional wall motion abnormalities.  No artifact or left ventricular cavity size enlargement appreciated on review of imaging. SPECT images demonstrated no evidence of  stress-induced myocardial ischemia or arrhythmia; no scintigraphic evidence of scar.  TID ratio = 0.96.  Indeterminate treadmill ECG due  to baseline ECG changes.  Most recent TTE performed on 09/29/2023 revealed a normal left ventricular systolic function with an EF of >55% %. There was mild LVH.  There were no regional wall motion abnormalities. Left ventricular diastolic Doppler parameters consistent with abnormal relaxation (G1DD). Right ventricular size and function normal with a TAPSE measuring 1.0 cm  (normal range >/= 1.6cm).  There was trivial mitral, in addition to mild pulmonary and tricuspid  valve regurgitation. All transvalvular gradients were noted to be normal providing no evidence suggestive of valvular stenosis. Aorta normal in size with no evidence of ectasia or aneurysmal dilatation.  Patient with an atrial fibrillation diagnosis; CHA2DS2-VASc Score = 5 (age, sex, HTN, vascular disease, T2DM). Her rate and rhythm are currently being maintained on oral metoprolol succinate. She is not currently taking any type of chronic oral anticoagulation therapies.  Blood pressure well controlled at 124/70 mmHg on currently prescribed CCB (amlodipine), beta-blocker (metoprolol succinate), diuretic (spironolactone + torsemide), and ARB (losartan) therapies. She is on atorvastatin for her HLD diagnosis and further ASCVD prevention. T2DM well controlled on currently prescribed regimen; last HgbA1c was 6.7% when checked on 08/29/2023.  Of note, since patient was last seen by cardiology, her hemoglobin A1c has been rechecked with interval increase to 7.1% on 12/16/2023. Patient does not have an OSAH diagnosis. Patient is able to complete all of her  ADL/IADLs without cardiovascular limitation.  Per the DASI, patient is able to achieve at least 4 METS of physical activity without experiencing any significant degree of angina/anginal equivalent symptoms. No changes were made to her medication regimen during her  visit with cardiology.  Patient scheduled to follow-up with outpatient cardiology in 6 months or sooner if needed.  Hailey Howard is scheduled for an elective ARTHROSCOPIC ILIOTIBIAL BAND RELEASE (LEFT HIP) on 02/07/2024 with Dr. Signa Kell, MD.  Given patient's past medical history significant for cardiovascular diagnoses, presurgical cardiac clearance was sought by the PAT team. Per cardiology, "this patient is optimized for surgery and may proceed with the planned procedural course with a MODERATE risk of significant perioperative cardiovascular complications".  In review of the patient's chart, it is noted that she is on daily oral antithrombotic therapy. Given that patient's past medical history is significant for cardiovascular diagnoses, including but not limited to CAD, orthopedics has cleared patient to continue her daily low dose ASA throughout her perioperative course.  The patient will hold her dose on the day of surgery only. Patient has been updated on these directives from her specialty care providers by the PAT team.  Patient denies previous perioperative complications with anesthesia in the past. In review her EMR, it is noted that patient underwent a general anesthetic course here at [facility] (ASA III) in 02/2023 without documented complications.      01/30/2024    3:31 PM 11/30/2023    9:42 AM 10/27/2023    2:41 PM  Vitals with BMI  Height 5\' 7"  5\' 7"  5\' 7"   Weight 245 lbs 247 lbs 11 oz 248 lbs 11 oz  BMI 38.36 38.79 38.94  Systolic  134 124  Diastolic  82 86  Pulse  66 98   Providers/Specialists:  NOTE: Primary physician provider listed below. Patient may have been seen by APP or partner within same practice.   PROVIDER ROLE / SPECIALTY LAST Sarina Ser, MD Orthopedics (Surgeon) 01/25/2024  Margarita Mail, DO Primary Care Provider 11/30/2023  Tiajuana Amass, MD Cardiology 10/17/2023  Edward Jolly,  MD Pain Management  10/05/2023  Louretta Shorten, MD Hematology 09/02/2023  Molli Barrows, MD Nephrology 01/16/2024  Wendall Mola, MD Endocrinology 12/22/2023   Allergies:   Allergies  Allergen Reactions   Iodinated Contrast Media Anaphylaxis, Hives and Itching   Oxycodone-Acetaminophen Hives   Etodolac Itching   Current Home Medications:   No current facility-administered medications for this encounter.    amLODipine (NORVASC) 5 MG tablet   aspirin EC 81 MG tablet   atorvastatin (LIPITOR) 40 MG tablet   Cholecalciferol 25 MCG (1000 UT) tablet   dicyclomine (BENTYL) 20 MG tablet   FIASP 100 UNIT/ML SOLN   fluticasone (FLONASE) 50 MCG/ACT nasal spray   gabapentin (NEURONTIN) 300 MG capsule   insulin glargine (LANTUS SOLOSTAR) 100 UNIT/ML Solostar Pen   Iron-Vitamin C (VITRON-C) 65-125 MG TABS   levothyroxine (SYNTHROID) 137 MCG tablet   linaclotide (LINZESS) 72 MCG capsule   Magnesium 400 MG CAPS   Menthol, Topical Analgesic, (ICY HOT EX)   Menthol-Methyl Salicylate (SALONPAS PAIN RELIEF PATCH EX)   metFORMIN (GLUCOPHAGE) 500 MG tablet   metoprolol succinate (TOPROL-XL) 25 MG 24 hr tablet   Multiple Vitamin (MULTIVITAMIN WITH MINERALS) TABS tablet   omeprazole (PRILOSEC) 40 MG capsule   Polyvinyl Alcohol-Povidone PF (REFRESH) 1.4-0.6 % SOLN   Semaglutide (OZEMPIC, 0.25 OR 0.5 MG/DOSE, [place])   spironolactone (ALDACTONE) 50 MG tablet   telmisartan (MICARDIS) 80 MG tablet   torsemide (DEMADEX) 10 MG tablet   History:   Past Medical History:  Diagnosis Date   Anemia of chronic kidney failure, stage 3 (moderate) (HCC) 08/17/2019   Anemia of chronic renal failure    Aortic atherosclerosis (HCC)    Arthritis    Bradycardia    Cataract    CKD (chronic kidney disease), stage III (HCC)    Colon polyps    Coronary artery disease 05/03/2001   a.) LHC 05/03/2001: 95% pLCx, 95% mLAD-1, 75% mLAD-2, 95% D2 --> LAD dissection occurred during procedure --> CVTS consult; b.) s/p 3v CABG 05/03/2001:  LIMA-LAD,SVG-OM2, SVG-D2; c.) LHC/PCI 02/12/2004: 25% pRCA, 25% mRCA, 95% RPDA, 25% LM, 25% pLCx, 95% mLCx (3.0 x 13 mm Cypher), 50% OM3, 25% pLAD, 100% mLAD, 25% D2, 25% LIMA-LAD, 100% SVG-OM2, 50% SVG-D2   Coronary artery dissection 05/03/2001   a.) LAD dissection occurred during diagnostic PCI at [facility]; IABP placed and patient sent for emergency CABG   Diabetic neuropathy (HCC)    Dysphagia    Exertional dyspnea    Gastritis    GERD (gastroesophageal reflux disease)    Headache    History of hiatal hernia    History of shingles    HLD (hyperlipidemia)    Hypertension    IBS (irritable bowel syndrome)    IDA (iron deficiency anemia)    a.) followed by hematology; receiving iron infusions PRN   Long-term use of aspirin therapy    Mesenteric mass    Muscle tension dysphonia    PAF (paroxysmal atrial fibrillation) (HCC)    a.) CHA2DS2VASc = 5 (age, sex, HTN, vascular disease, T2DM) as of 02/06/2024; b.) rate/rhythm maintained on oral metoprolol succinate; no chronic OAC   Peripheral vascular disease (HCC)    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   a.) s/p total thyroidectomy for thyroid cancer   Right BBB/left ant fasc block    Rotator cuff tendinitis, left    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   a.) SVG-OM2, SVG-D2, LIMA-LAD   SVT (supraventricular tachycardia) (HCC)    T2DM (type 2 diabetes mellitus) (HCC)  Thyroid cancer (HCC)    a.) s/p total thyroidectomy 03/14/2020   Trochanteric bursitis of left hip    Vitamin D deficiency    Past Surgical History:  Procedure Laterality Date   ABDOMINAL HYSTERECTOMY     CHOLECYSTECTOMY     COLONOSCOPY WITH PROPOFOL N/A 12/20/2016   Procedure: COLONOSCOPY WITH PROPOFOL;  Surgeon: Scot Jun, MD;  Location[facility] ENDOSCOPY;  Service: Endoscopy;  Laterality: N/A;   COLONOSCOPY WITH PROPOFOL N/A 01/19/2022   Procedure: COLONOSCOPY WITH PROPOFOL;  Surgeon: Regis Bill, MD;  Location: [facility] ENDOSCOPY;  Service: Endoscopy;  Laterality:  N/A;   CORONARY ANGIOPLASTY WITH STENT PLACEMENT Left 02/12/2004   Procedure: CORONARY ANGIOPLASTY WITH STENT PLACEMENT; Location: [facility]; Surgeon: Eugenia Pancoast, MD   CORONARY ARTERY BYPASS GRAFT N/A 05/03/2001   Procedure: CORONARY ARTERY BYPASS GRAFT; Location: [facility]; Surgeon: Flint Melter, MD   ESOPHAGOGASTRODUODENOSCOPY N/A 03/15/2023   Procedure: ESOPHAGOGASTRODUODENOSCOPY (EGD);  Surgeon: Regis Bill, MD;  Location: [facility] ENDOSCOPY;  Service: Endoscopy;  Laterality: N/A;   ESOPHAGOGASTRODUODENOSCOPY (EGD) WITH PROPOFOL N/A 04/27/2017   Procedure: ESOPHAGOGASTRODUODENOSCOPY (EGD) WITH PROPOFOL;  Surgeon: Scot Jun, MD;  Location: [facility] ENDOSCOPY;  Service: Endoscopy;  Laterality: N/A;   EUS N/A 04/24/2015   Procedure: ESOPHAGEAL ENDOSCOPIC ULTRASOUND (EUS) RADIAL;  Surgeon: Wayland Salinas, MD;  Location: [facility] ENDOSCOPY;  Service: Endoscopy;  Laterality: N/A;   LEFT HEART CATH AND CORONARY ANGIOGRAPHY Left 05/03/2001   Procedure: LEFT HEART CATH AND CORONARY ANGIOGRAPHY; Location: [facility]; Surgeon: Emilio Math, MD   TOTAL HIP ARTHROPLASTY Left 02/24/2021   Procedure: TOTAL HIP ARTHROPLASTY ANTERIOR APPROACH;  Surgeon: Kennedy Bucker, MD;  Location: [facility] ORS;  Service: Orthopedics;  Laterality: Left;   TOTAL THYROIDECTOMY N/A 03/14/2020   Procedure: TOTAL THYROIDECTOMY; Location: [facility]   Family History  Problem Relation Age of Onset   Arthritis Sister    Heart disease Sister    Arthritis Sister    Arthritis Sister    Breast cancer Neg Hx    Social History   Tobacco Use   Smoking status: Never   Smokeless tobacco: Never  Substance Use Topics   Alcohol use: No   Pertinent Clinical Results:  LABS:  Lab Results  Component Value Date   WBC 8.2 09/02/2023   HGB 10.4 (L) 09/02/2023   HCT 33.5 (L) 09/02/2023   MCV 89.3 09/02/2023   PLT 313 09/02/2023    Component Ref Range & Units 01/10/2024  Sodium 135 - 145 mmol/L 141  Potassium 3.4 - 4.8 mmol/L 4   Chloride 98 - 107 mmol/L 101  CO2 20.0 - 31.0 mmol/L 26  Anion Gap 5 - 14 mmol/L 14  BUN 9 - 23 mg/dL 35 High   Creatinine 8.65 - 1.02 mg/dL 7.84 High   BUN/Creatinine Ratio 20  eGFR CKD-EPI (2021) Female >=60 mL/min/1.44m2 30 Low   Glucose 70 - 179 mg/dL 696 High   Calcium 8.7 - 10.4 mg/dL 9.4  Resulting Agency [facility]  Specimen Collected: 01/10/24 08:21   Performed by: [facility] Last Resulted: 01/10/24 16:17  Received From: [facility]  Result Received: 01/12/24 13:38     Component Ref Range & Units 12/16/2023  Hemoglobin A1C 4.2 - 5.6 % 7.1 High   Average Blood Glucose (Calc) mg/dL 295  Resulting Agency [facility]    ECG: Date: 09/16/2023  Time ECG obtained: 1000 AM Rate: 63 bpm Rhythm:  SR with 1st degree AV block; RBBB + LAFB Axis (leads  I and aVF): normal Intervals: PR 212 ms. QRS 144 ms. QTc 503 ms. ST segment and T wave changes: No evidence of acute T wave abnormalities or significant ST segment elevation or depression.  Evidence of a possible, age undetermined, prior infarct:  Yes; lateral Comparison: Previous tracing obtained on 04/13/2023 showed normal sinus rhythm with right ventricular conduction delay; LVH with QRS widening.  There was evidence of an inferior anterolateral infarct noted. NOTE: Tracing obtained at [facility]; unable for review. Above based on cardiologist's interpretation.    IMAGING / PROCEDURES: MR HIP LEFT WO CONTRAST performed on 10/28/2023 Left total hip arthroplasty without MRI evidence of complicating features. Mild to moderate spurring of the right (contralateral) femoral head and right lateral acetabulum. Small lipoma along the right (contralateral) hip adductor musculature.  TRANSTHORACIC ECHOCARDIOGRAM performed on 09/29/2023 Normal left ventricular systolic function with an EF of >55% Mild LVH No regional wall motion abnormalities Left  ventricular diastolic Doppler parameters consistent with abnormal relaxation (G1DD). Normal right ventricular size and function; TAPSE = 1.0 cm Trivial MR and TR Mild PR Normal gradients; no valvular stenosis  MR THORACIC SPINE WO CONTRAST performed on 07/29/2023 No abnormality seen to explain thoracic region pain.  No thoracic disc level pathology.  No stenosis of the canal or foramina. Heterogeneous mass in the posterior mediastinum in contact with the trachea and esophagus. This measures approximately 4 x 2.5 cm. This was described on a CT scan from 2016 though the images are not available. The lesion was felt to have benign characteristics in 2016. If there is clinical concern, repeat chest CT could be considered. I wonder if this relates to the patient's goiter.  MYOCARDIAL PERFUSION IMAGING STUDY (LEXISCAN) performed on 02/25/2020 LVEF 58% Regional wall motion reveals myocardial thickening and wall motion No artifacts noted Left ventricular cavity size normal No evidence of stress-induced myocardial ischemia or arrhythmia The overall quality of the study is good    LEFT HEART CATHETERIZATION AND CORONARY ANGIOGRAPHY performed on 02/12/2004 Normal left ventricular systolic function with hyperdynamic LVEF of 71% Multivessel CAD 25% stenosis proximal RCA 25% stenosis mid RCA 95% stenosis RPDA 25% stenosis LM 25% stenosis proximal LCx 95% stenosis mid LCx 50% stenosis OM 25% stenosis proximal LAD 100% stenosis mid LAD 25% stenosis D2 PCTA and PCI performed 95% stenotic lesion in the LCx using a 3 x 13 mm Cypher drug-eluting stent.   CORONARY ARTERY BYPASS GRAFTING performed on 05/03/2001 LVEF >50% Grafts x 3 placed  LIMA-LAD SVG-OM 2 SVG-D2   LEFT HEART CATHETERIZATION AND CORONARY ANGIOGRAPHY performed on 05/03/2001 Normal left ventricular systolic function and EF Multivessel CAD 95% stenosis proximal LCx 95% and 75% lesions in the mid LAD 95% stenosis of the D2 RCA  not evaluated Left main normal Unable to cross lesion with wire resulting in a LAD coronary dissection.   Patient remained hemodynamically stable with slight discomfort and no EKG changes.   Patient sent for emergency CABG after IABP insertion.  Impression and Plan:  Hailey Howard has been referred for pre-anesthesia review and clearance prior to her undergoing the planned anesthetic and procedural courses. Available labs, pertinent testing, and imaging results were personally reviewed by me in preparation for upcoming operative/procedural course. [facility] medical record has been updated following extensive record review and patient interview with PAT staff.   This patient has been appropriately cleared by cardiology with an overall MODERATE risk of experiencing significant perioperative cardiovascular complications. Based on clinical review performed today (02/03/24), barring any  significant acute changes in the patient's overall condition, it is anticipated that she will be able to proceed with the planned surgical intervention. Any acute changes in clinical condition may necessitate her procedure being postponed and/or cancelled. Patient will meet with anesthesia team (MD and/or CRNA) on the day of her procedure for preoperative evaluation/assessment. Questions regarding anesthetic course will be fielded at that time.   Pre-surgical instructions were reviewed with the patient during his PAT appointment, and questions were fielded to satisfaction by PAT clinical staff. She has been instructed on which medications that she will need to hold prior to surgery, as well as the ones that have been deemed safe/appropriate to take on the day of his procedure. As part of the general education provided by PAT, patient made aware both verbally and in writing, that she would need to abstain from the use of any illegal substances during his perioperative course. She was advised that failure to follow the  provided instructions could necessitate case cancellation or result in serious perioperative complications up to and including death. Patient encouraged to contact PAT and/or her surgeon's office to discuss any questions or concerns that may arise prior to surgery; verbalized understanding.   Quentin Mulling, MSN, APRN, FNP-C, CEN [facility]  Perioperative Services Nurse Practitioner Phone: [phone] Fax: [phone] 02/03/24 12:40 PM  NOTE: This note has been prepared using Dragon dictation software. Despite my best ability to proofread, there is always the potential that unintentional transcriptional errors may still occur from this process.

## 2024-02-05 NOTE — Anesthesia Preprocedure Evaluation (Addendum)
 Anesthesia Evaluation  Patient identified by MRN, date of birth, ID band Patient awake    Reviewed: Allergy & Precautions, NPO status , Patient's Chart, lab work & pertinent test results, reviewed documented beta blocker date and time   History of Anesthesia Complications Negative for: history of anesthetic complications  Airway Mallampati: III       Dental  (+) Missing, Poor Dentition   Pulmonary sleep apnea , neg COPD, Not current smoker   Pulmonary exam normal        Cardiovascular METS: 3 - Mets hypertension, Pt. on medications + CAD (-CAD with a h/o CABG 2002 (SVG-OM2, SVG-D2, LIMA-LAD) w/ subsequent PCI in 2005 to native Cx (occluded vein graft) with a 3.0 Cypher DES), + CABG (2002) and + Peripheral Vascular Disease  Normal cardiovascular exam+ dysrhythmias Atrial Fibrillation (-) Valvular Problems/Murmurs  ECHO 11/24: CONCLUSION -------------------------------------------------------------------------------  NORMAL LEFT VENTRICULAR SYSTOLIC FUNCTION WITH MILD LVH  ESTIMATED EF: >55%  NORMAL LA PRESSURES WITH DIASTOLIC DYSFUNCTION (GRADE 1)  NORMAL RIGHT VENTRICULAR SYSTOLIC FUNCTION  VALVULAR REGURGITATION: No AR, TRIVIAL MR, MILD PR, MILD TR  NO VALVULAR STENOSIS    ECG: Date: 09/16/2023  Time ECG obtained: 1000 AM Rate: 63 bpm Rhythm:  SR with 1st degree AV block; RBBB + LAFB Axis (leads I and aVF): normal Intervals: PR 212 ms. QRS 144 ms. QTc 503 ms. ST segment and T wave changes: No evidence of acute T wave abnormalities or significant ST segment elevation or depression.  Evidence of a possible, age undetermined, prior infarct:  Yes; lateral Comparison: Previous tracing obtained on 04/13/2023 showed normal sinus rhythm with right ventricular conduction delay; LVH with QRS widening.  There was evidence of an inferior anterolateral infarct noted. NOTE: Tracing obtained at [facility]; unable for review. Above  based on cardiologist's interpretation.     Neuro/Psych  Headaches, neg Seizures  Neuromuscular disease  negative psych ROS   GI/Hepatic Neg liver ROS, hiatal hernia,GERD  Medicated and Poorly Controlled,,PHAYRNGOESOPHAGEAL DYSPHAGIA   Endo/Other  diabetes, Type 2, Oral Hypoglycemic AgentsHypothyroidism (s/p thyroidectomy)    Renal/GU Renal InsufficiencyRenal disease     Musculoskeletal  (+) Arthritis , Osteoarthritis,    Abdominal Normal abdominal exam  (+)   Peds negative pediatric ROS (+)  Hematology  (+) Blood dyscrasia, anemia   Anesthesia Other Findings Past Medical History: No date: Anemia     Comment:  iron anemia, now getting infusions 08/17/2019: Anemia of chronic kidney failure, stage 3 (moderate) (HCC) No date: Arthritis No date: Bradycardia No date: CKD (chronic kidney disease), stage III (HCC) No date: Coronary artery disease 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     Comment:  LAD during diagnostic PCI at [facility] No date: Diabetes mellitus without complication (HCC) 09/19/2015: DM type 2 with diabetic peripheral neuropathy (HCC) No date: Dysphagia No date: Dysrhythmia No date: Exertional dyspnea No date: GERD (gastroesophageal reflux disease) No date: Headache No date: History of hiatal hernia No date: History of shingles No date: HLD (hyperlipidemia) 05/03/2001: Hx of CABG     Comment:  SVG-OM2, SVG-D2, LIMA-LAD No date: Hx of thyroid cancer     Comment:  s/p thyroidectomy No date: Hypertension No date: IBS (irritable bowel syndrome) No date: Mesenteric mass No date: Peripheral vascular disease (HCC) No date: Right BBB/left ant fasc block No date: Rotator cuff tendinitis, left No date: Unspecified atrial fibrillation (HCC)  Reproductive/Obstetrics  Anesthesia Physical Anesthesia Plan  ASA: III  Anesthesia Plan: General   Post-op Pain Management: Tylenol PO (pre-op)*   Induction:  Intravenous  PONV Risk Score and Plan: 3 and Ondansetron, Dexamethasone and Midazolam  Airway Management Planned: Oral ETT  Additional Equipment:   Intra-op Plan:   Post-operative Plan: Extubation in OR  Informed Consent: I have reviewed the patients History and Physical, chart, labs and discussed the procedure including the risks, benefits and alternatives for the proposed anesthesia with the patient or authorized representative who has indicated his/her understanding and acceptance.     Dental advisory given  Plan Discussed with: CRNA and Anesthesiologist  Anesthesia Plan Comments:         Anesthesia Quick Evaluation

## 2024-02-06 ENCOUNTER — Encounter: Payer: Self-pay | Admitting: Orthopedic Surgery

## 2024-02-07 ENCOUNTER — Encounter: Payer: Self-pay | Admitting: Orthopedic Surgery

## 2024-02-07 ENCOUNTER — Other Ambulatory Visit: Payer: Self-pay

## 2024-02-07 ENCOUNTER — Encounter
Admission: RE | Disposition: A | Discharge status: Home / Self Care | Payer: Self-pay | Source: Home / Self Care | Attending: Orthopedic Surgery

## 2024-02-07 ENCOUNTER — Ambulatory Visit: Payer: Self-pay | Admitting: Urgent Care

## 2024-02-07 ENCOUNTER — Ambulatory Visit
Admission: RE | Admit: 2024-02-07 | Discharge: 2024-02-07 | Disposition: A | Discharge status: Home / Self Care | Attending: Orthopedic Surgery | Admitting: Orthopedic Surgery

## 2024-02-07 ENCOUNTER — Ambulatory Visit

## 2024-02-07 DIAGNOSIS — Z8249 Family history of ischemic heart disease and other diseases of the circulatory system: Secondary | ICD-10-CM | POA: Diagnosis not present

## 2024-02-07 DIAGNOSIS — M7062 Trochanteric bursitis, left hip: Secondary | ICD-10-CM | POA: Diagnosis present

## 2024-02-07 DIAGNOSIS — I480 Paroxysmal atrial fibrillation: Secondary | ICD-10-CM | POA: Diagnosis not present

## 2024-02-07 DIAGNOSIS — E1151 Type 2 diabetes mellitus with diabetic peripheral angiopathy without gangrene: Secondary | ICD-10-CM | POA: Diagnosis not present

## 2024-02-07 DIAGNOSIS — N183 Chronic kidney disease, stage 3 unspecified: Secondary | ICD-10-CM | POA: Insufficient documentation

## 2024-02-07 DIAGNOSIS — E890 Postprocedural hypothyroidism: Secondary | ICD-10-CM | POA: Diagnosis not present

## 2024-02-07 DIAGNOSIS — I2510 Atherosclerotic heart disease of native coronary artery without angina pectoris: Secondary | ICD-10-CM | POA: Insufficient documentation

## 2024-02-07 DIAGNOSIS — E1159 Type 2 diabetes mellitus with other circulatory complications: Secondary | ICD-10-CM

## 2024-02-07 DIAGNOSIS — Z951 Presence of aortocoronary bypass graft: Secondary | ICD-10-CM | POA: Diagnosis not present

## 2024-02-07 DIAGNOSIS — Z7984 Long term (current) use of oral hypoglycemic drugs: Secondary | ICD-10-CM | POA: Insufficient documentation

## 2024-02-07 DIAGNOSIS — Z794 Long term (current) use of insulin: Secondary | ICD-10-CM | POA: Insufficient documentation

## 2024-02-07 DIAGNOSIS — G4730 Sleep apnea, unspecified: Secondary | ICD-10-CM | POA: Diagnosis not present

## 2024-02-07 DIAGNOSIS — M7632 Iliotibial band syndrome, left leg: Secondary | ICD-10-CM | POA: Insufficient documentation

## 2024-02-07 DIAGNOSIS — E1122 Type 2 diabetes mellitus with diabetic chronic kidney disease: Secondary | ICD-10-CM | POA: Diagnosis not present

## 2024-02-07 DIAGNOSIS — I071 Rheumatic tricuspid insufficiency: Secondary | ICD-10-CM | POA: Diagnosis not present

## 2024-02-07 DIAGNOSIS — I129 Hypertensive chronic kidney disease with stage 1 through stage 4 chronic kidney disease, or unspecified chronic kidney disease: Secondary | ICD-10-CM | POA: Insufficient documentation

## 2024-02-07 DIAGNOSIS — K219 Gastro-esophageal reflux disease without esophagitis: Secondary | ICD-10-CM | POA: Insufficient documentation

## 2024-02-07 DIAGNOSIS — K449 Diaphragmatic hernia without obstruction or gangrene: Secondary | ICD-10-CM | POA: Diagnosis not present

## 2024-02-07 DIAGNOSIS — E785 Hyperlipidemia, unspecified: Secondary | ICD-10-CM | POA: Diagnosis not present

## 2024-02-07 DIAGNOSIS — D631 Anemia in chronic kidney disease: Secondary | ICD-10-CM | POA: Diagnosis not present

## 2024-02-07 DIAGNOSIS — Z7985 Long-term (current) use of injectable non-insulin antidiabetic drugs: Secondary | ICD-10-CM | POA: Insufficient documentation

## 2024-02-07 DIAGNOSIS — G709 Myoneural disorder, unspecified: Secondary | ICD-10-CM | POA: Insufficient documentation

## 2024-02-07 DIAGNOSIS — M1990 Unspecified osteoarthritis, unspecified site: Secondary | ICD-10-CM | POA: Diagnosis not present

## 2024-02-07 DIAGNOSIS — Z79899 Other long term (current) drug therapy: Secondary | ICD-10-CM | POA: Insufficient documentation

## 2024-02-07 HISTORY — DX: Vitamin D deficiency, unspecified: E55.9

## 2024-02-07 HISTORY — DX: Type 2 diabetes mellitus without complications: E11.9

## 2024-02-07 HISTORY — DX: Polyp of colon: K63.5

## 2024-02-07 HISTORY — DX: Trochanteric bursitis, left hip: M70.62

## 2024-02-07 HISTORY — DX: Long term (current) use of aspirin: Z79.82

## 2024-02-07 HISTORY — DX: Atherosclerosis of aorta: I70.0

## 2024-02-07 HISTORY — DX: Anemia in chronic kidney disease: D63.1

## 2024-02-07 HISTORY — DX: Malignant neoplasm of thyroid gland: C73

## 2024-02-07 HISTORY — DX: Supraventricular tachycardia, unspecified: I47.10

## 2024-02-07 HISTORY — DX: Dysphonia: R49.0

## 2024-02-07 HISTORY — DX: Type 2 diabetes mellitus with diabetic neuropathy, unspecified: E11.40

## 2024-02-07 HISTORY — DX: Iron deficiency anemia, unspecified: D50.9

## 2024-02-07 HISTORY — DX: Gastritis, unspecified, without bleeding: K29.70

## 2024-02-07 HISTORY — DX: Paroxysmal atrial fibrillation: I48.0

## 2024-02-07 LAB — GLUCOSE, CAPILLARY
Glucose-Capillary: 212 mg/dL — ABNORMAL HIGH (ref 70–99)
Glucose-Capillary: 195 mg/dL — ABNORMAL HIGH (ref 70–99)

## 2024-02-07 SURGERY — RELEASE, ILIOTIBIAL BAND, ARTHROSCOPIC
Anesthesia: General | Site: Hip | Laterality: Left

## 2024-02-07 MED ORDER — DEXAMETHASONE SODIUM PHOSPHATE 10 MG/ML IJ SOLN
INTRAMUSCULAR | Status: AC
  Filled 2024-02-07: qty 1

## 2024-02-07 MED ORDER — INSULIN ASPART 100 UNIT/ML IJ SOLN
5.0000 [IU] | Freq: Once | INTRAMUSCULAR | Status: AC
  Administered 2024-02-07: 5 [IU] via SUBCUTANEOUS

## 2024-02-07 MED ORDER — MIDAZOLAM HCL 2 MG/2ML IJ SOLN
INTRAMUSCULAR | Status: DC | PRN
  Administered 2024-02-07: 1 mg via INTRAVENOUS

## 2024-02-07 MED ORDER — ONDANSETRON HCL 4 MG/2ML IJ SOLN
INTRAMUSCULAR | Status: AC
  Filled 2024-02-07: qty 2

## 2024-02-07 MED ORDER — CHLORHEXIDINE GLUCONATE 0.12 % MT SOLN
OROMUCOSAL | Status: AC
  Filled 2024-02-07: qty 15

## 2024-02-07 MED ORDER — LIDOCAINE-EPINEPHRINE 1 %-1:100000 IJ SOLN
INTRAMUSCULAR | Status: DC | PRN
  Administered 2024-02-07: 25 mL via INTRAMUSCULAR

## 2024-02-07 MED ORDER — ACETAMINOPHEN 500 MG PO TABS: 1000.0000 mg | ORAL_TABLET | Freq: Three times a day (TID) | ORAL | 2 refills | Status: AC

## 2024-02-07 MED ORDER — HYDROCODONE-ACETAMINOPHEN 10-325 MG PO TABS: 1.0000 | ORAL_TABLET | ORAL | 0 refills | Status: DC | PRN

## 2024-02-07 MED ORDER — SODIUM CHLORIDE 0.9 % IV SOLN: INTRAVENOUS | Status: DC

## 2024-02-07 MED ORDER — PROPOFOL 10 MG/ML IV BOLUS
INTRAVENOUS | Status: DC | PRN
  Administered 2024-02-07: 150 mg via INTRAVENOUS

## 2024-02-07 MED ORDER — FENTANYL CITRATE (PF) 100 MCG/2ML IJ SOLN
INTRAMUSCULAR | Status: AC
  Filled 2024-02-07: qty 2

## 2024-02-07 MED ORDER — DEXAMETHASONE SODIUM PHOSPHATE 10 MG/ML IJ SOLN
INTRAMUSCULAR | Status: DC | PRN
  Administered 2024-02-07: 5 mg via INTRAVENOUS

## 2024-02-07 MED ORDER — GABAPENTIN 300 MG PO CAPS
ORAL_CAPSULE | ORAL | Status: AC
  Filled 2024-02-07: qty 1

## 2024-02-07 MED ORDER — ACETAMINOPHEN 500 MG PO TABS
ORAL_TABLET | ORAL | Status: AC
  Filled 2024-02-07: qty 2

## 2024-02-07 MED ORDER — ASPIRIN 325 MG PO TBEC
325.0000 mg | DELAYED_RELEASE_TABLET | Freq: Every day | ORAL | 0 refills | Status: AC
Start: 2024-02-07 — End: 2024-02-21

## 2024-02-07 MED ORDER — SUGAMMADEX SODIUM 200 MG/2ML IV SOLN
INTRAVENOUS | Status: DC | PRN
  Administered 2024-02-07: 200 mg via INTRAVENOUS

## 2024-02-07 MED ORDER — ROCURONIUM BROMIDE 100 MG/10ML IV SOLN
INTRAVENOUS | Status: DC | PRN
  Administered 2024-02-07: 70 mg via INTRAVENOUS

## 2024-02-07 MED ORDER — FENTANYL CITRATE (PF) 100 MCG/2ML IJ SOLN: 25.0000 ug | INTRAMUSCULAR | Status: DC | PRN

## 2024-02-07 MED ORDER — PROPOFOL 10 MG/ML IV BOLUS
INTRAVENOUS | Status: AC
  Filled 2024-02-07: qty 20

## 2024-02-07 MED ORDER — BUPIVACAINE HCL (PF) 0.5 % IJ SOLN
INTRAMUSCULAR | Status: AC
  Filled 2024-02-07: qty 30

## 2024-02-07 MED ORDER — ONDANSETRON HCL 4 MG/2ML IJ SOLN
INTRAMUSCULAR | Status: DC | PRN
  Administered 2024-02-07: 4 mg via INTRAVENOUS

## 2024-02-07 MED ORDER — LIDOCAINE HCL (PF) 2 % IJ SOLN
INTRAMUSCULAR | Status: AC
  Filled 2024-02-07: qty 5

## 2024-02-07 MED ORDER — PHENYLEPHRINE 80 MCG/ML (10ML) SYRINGE FOR IV PUSH (FOR BLOOD PRESSURE SUPPORT)
PREFILLED_SYRINGE | INTRAVENOUS | Status: DC | PRN
  Administered 2024-02-07: 160 ug via INTRAVENOUS
  Administered 2024-02-07: 80 ug via INTRAVENOUS

## 2024-02-07 MED ORDER — LACTATED RINGERS IR SOLN
Status: DC | PRN
  Administered 2024-02-07 (×8): 3000 mL

## 2024-02-07 MED ORDER — INSULIN ASPART 100 UNIT/ML IJ SOLN
INTRAMUSCULAR | Status: AC
  Filled 2024-02-07: qty 1

## 2024-02-07 MED ORDER — LIDOCAINE-EPINEPHRINE 1 %-1:100000 IJ SOLN
INTRAMUSCULAR | Status: AC
  Filled 2024-02-07: qty 1

## 2024-02-07 MED ORDER — CEFAZOLIN SODIUM-DEXTROSE 2-4 GM/100ML-% IV SOLN
INTRAVENOUS | Status: AC
  Filled 2024-02-07: qty 100

## 2024-02-07 MED ORDER — ACETAMINOPHEN 500 MG PO TABS
1000.0000 mg | ORAL_TABLET | Freq: Once | ORAL | Status: AC
  Administered 2024-02-07: 1000 mg via ORAL

## 2024-02-07 MED ORDER — ACETAMINOPHEN 10 MG/ML IV SOLN: 1000.0000 mg | Freq: Once | INTRAVENOUS | Status: DC | PRN

## 2024-02-07 MED ORDER — OXYCODONE HCL 5 MG/5ML PO SOLN: 5.0000 mg | Freq: Once | ORAL | Status: DC | PRN

## 2024-02-07 MED ORDER — LIDOCAINE HCL (CARDIAC) PF 100 MG/5ML IV SOSY
PREFILLED_SYRINGE | INTRAVENOUS | Status: DC | PRN
  Administered 2024-02-07: 50 mg via INTRAVENOUS

## 2024-02-07 MED ORDER — ORAL CARE MOUTH RINSE: 15.0000 mL | Freq: Once | OROMUCOSAL | Status: AC

## 2024-02-07 MED ORDER — CEFAZOLIN SODIUM 1 G IJ SOLR
INTRAMUSCULAR | Status: AC
  Filled 2024-02-07: qty 10

## 2024-02-07 MED ORDER — ROCURONIUM BROMIDE 10 MG/ML (PF) SYRINGE
PREFILLED_SYRINGE | INTRAVENOUS | Status: AC
  Filled 2024-02-07: qty 10

## 2024-02-07 MED ORDER — OXYCODONE HCL 5 MG PO TABS: 5.0000 mg | ORAL_TABLET | Freq: Once | ORAL | Status: DC | PRN

## 2024-02-07 MED ORDER — PHENYLEPHRINE 80 MCG/ML (10ML) SYRINGE FOR IV PUSH (FOR BLOOD PRESSURE SUPPORT)
PREFILLED_SYRINGE | INTRAVENOUS | Status: AC
  Filled 2024-02-07: qty 10

## 2024-02-07 MED ORDER — CHLORHEXIDINE GLUCONATE 0.12 % MT SOLN
15.0000 mL | Freq: Once | OROMUCOSAL | Status: AC
  Administered 2024-02-07: 15 mL via OROMUCOSAL

## 2024-02-07 MED ORDER — CEFAZOLIN SODIUM-DEXTROSE 2-4 GM/100ML-% IV SOLN
2.0000 g | INTRAVENOUS | Status: AC
  Administered 2024-02-07: 3 g via INTRAVENOUS

## 2024-02-07 MED ORDER — DROPERIDOL 2.5 MG/ML IJ SOLN: 0.6250 mg | Freq: Once | INTRAMUSCULAR | Status: DC | PRN

## 2024-02-07 MED ORDER — SUGAMMADEX SODIUM 200 MG/2ML IV SOLN
INTRAVENOUS | Status: AC
Start: 2024-02-07 — End: ?
  Filled 2024-02-07: qty 2

## 2024-02-07 MED ORDER — GABAPENTIN 300 MG PO CAPS: 300.0000 mg | ORAL_CAPSULE | Freq: Once | ORAL | Status: DC

## 2024-02-07 MED ORDER — MIDAZOLAM HCL 2 MG/2ML IJ SOLN
INTRAMUSCULAR | Status: AC
  Filled 2024-02-07: qty 2

## 2024-02-07 MED ORDER — FENTANYL CITRATE (PF) 100 MCG/2ML IJ SOLN
INTRAMUSCULAR | Status: DC | PRN
  Administered 2024-02-07 (×2): 50 ug via INTRAVENOUS

## 2024-02-07 SURGICAL SUPPLY — 44 items
ADAPTER IRRIG TUBE 2 SPIKE SOL (ADAPTER) ×2 IMPLANT
BLADE FULL RADIUS 3.5 (BLADE) IMPLANT
BLADE SHAVER 4.5X7 STR FR (MISCELLANEOUS) IMPLANT
BNDG ADH 1X3 SHEER STRL LF (GAUZE/BANDAGES/DRESSINGS) ×6 IMPLANT
BUR BR 5.5 WIDE MOUTH (BURR) IMPLANT
CANNULA TWIST IN 8.25X7CM (CANNULA) ×1 IMPLANT
CHLORAPREP W/TINT 26 (MISCELLANEOUS) ×1 IMPLANT
COOLER POLAR GLACIER W/PUMP (MISCELLANEOUS) IMPLANT
COVER LIGHT HANDLE STERIS (MISCELLANEOUS) IMPLANT
DRAPE C-ARM XRAY 36X54 (DRAPES) ×1 IMPLANT
DRAPE U-SHAPE 47X51 STRL (DRAPES) ×2 IMPLANT
GAUZE SPONGE 4X4 12PLY STRL (GAUZE/BANDAGES/DRESSINGS) ×1 IMPLANT
GAUZE XEROFORM 1X8 LF (GAUZE/BANDAGES/DRESSINGS) ×1 IMPLANT
GLOVE BIO SURGEON STRL SZ7.5 (GLOVE) ×2 IMPLANT
GLOVE BIO SURGEON STRL SZ8 (GLOVE) ×2 IMPLANT
GLOVE BIOGEL PI IND STRL 8 (GLOVE) ×1 IMPLANT
GLOVE INDICATOR 8.0 STRL GRN (GLOVE) ×1 IMPLANT
GLOVE SURG ORTHO 8.0 STRL STRW (GLOVE) ×1 IMPLANT
GLOVE SURG SYN 7.5 E (GLOVE) ×1 IMPLANT
GLOVE SURG SYN 7.5 PF PI (GLOVE) ×1 IMPLANT
GOWN STRL REUS W/ TWL LRG LVL3 (GOWN DISPOSABLE) ×1 IMPLANT
GOWN STRL REUS W/ TWL XL LVL3 (GOWN DISPOSABLE) ×1 IMPLANT
KIT TURNOVER KIT A (KITS) ×1 IMPLANT
MANIFOLD NEPTUNE II (INSTRUMENTS) ×1 IMPLANT
MAT ABSORB FLUID 56X50 GRAY (MISCELLANEOUS) ×1 IMPLANT
NDL HYPO 22X1.5 SAFETY MO (MISCELLANEOUS) ×1 IMPLANT
NEEDLE HYPO 22X1.5 SAFETY MO (MISCELLANEOUS) ×1 IMPLANT
PACK ARTHROSCOPY SHOULDER (MISCELLANEOUS) ×1 IMPLANT
PAD ABD DERMACEA PRESS 5X9 (GAUZE/BANDAGES/DRESSINGS) ×1 IMPLANT
PAD WRAPON POLAR SHDR UNIV (MISCELLANEOUS) IMPLANT
SOL .9 NS 3000ML IRR UROMATIC (IV SOLUTION) ×4 IMPLANT
SPONGE T-LAP 18X18 ~~LOC~~+RFID (SPONGE) ×1 IMPLANT
SUT ETHILON 3-0 FS-10 30 BLK (SUTURE) ×1 IMPLANT
SUT VIC AB 2-0 CT2 27 (SUTURE) ×1 IMPLANT
SUTURE EHLN 3-0 FS-10 30 BLK (SUTURE) ×1 IMPLANT
SYR 10ML LL (SYRINGE) ×1 IMPLANT
SYR 20ML LL LF (SYRINGE) ×1 IMPLANT
TRAP FLUID SMOKE EVACUATOR (MISCELLANEOUS) ×1 IMPLANT
TUBE SET DOUBLEFLO INFLOW (TUBING) ×1 IMPLANT
TUBE SET DOUBLEFLO OUTFLOW (TUBING) ×1 IMPLANT
WAND 4.6 50D HIPVAC 50 IFS (SURGICAL WAND) ×1 IMPLANT
WAND WEREWOLF FLOW 90D (MISCELLANEOUS) IMPLANT
WATER STERILE IRR 500ML POUR (IV SOLUTION) ×1 IMPLANT
WRAPON POLAR PAD SHDR UNIV (MISCELLANEOUS) ×1 IMPLANT

## 2024-02-07 NOTE — Transfer of Care (Signed)
 Immediate Anesthesia Transfer of Care Note  Patient: Hailey Howard  Procedure(s) Performed: RELEASE, ILIOTIBIAL BAND, ARTHROSCOPIC, TROCHANTERIC BURSECTOMY (Left: Hip)  Patient Location: PACU  Anesthesia Type:General  Level of Consciousness: drowsy and patient cooperative  Airway & Oxygen Therapy: Patient Spontanous Breathing and Patient connected to face mask oxygen  Post-op Assessment: Report given to RN and Post -op Vital signs reviewed and stable  Post vital signs: Reviewed and stable  Last Vitals:  Vitals Value Taken Time  BP 154/55 02/07/24 0916  Temp 36.5 C 02/07/24 0916  Pulse 62 02/07/24 0916  Resp 20 02/07/24 0916  SpO2 100 % 02/07/24 0916  Vitals shown include unfiled device data.  Last Pain:  Vitals:   02/07/24 0916  TempSrc:   PainSc: 0-No pain         Complications: No notable events documented.

## 2024-02-07 NOTE — H&P (Signed)
 Paper H&P to be scanned into permanent record. H&P reviewed. No significant changes noted.

## 2024-02-07 NOTE — Discharge Instructions (Addendum)
 Endoscopic Trochanteric Bursectomy   Post-Op Instructions   1. Bracing or crutches: Crutches will be provided at the time of discharge from the surgery center if you do not already have them. You can use a walker if you prefer.    2. Ice: You may be provided with a device [facility]) that allows you to ice the affected area effectively. Otherwise you can ice manually.    3. Driving:  Plan on not driving for at least one week. Please note that you are advised NOT to drive while taking narcotic pain medications as you may be impaired and unsafe to drive.   4. Activity: Ankle pumps several times an hour while awake to prevent blood clots. Weight bearing: as tolerated. Use crutches/walker as needed (usually 1-2 weeks) until pain allows you to ambulate without a limp. Avoid standing more than 5 minutes (consecutively) for the first week.  Avoid long distance travel for 2 weeks.   5. Medications:  - You have been provided a prescription for narcotic pain medicine. After surgery, take 1-2 narcotic tablets every 4 hours if needed for severe pain.  - You may take up to 3000mg /day of tylenol (acetaminophen). You can take 1000mg  3x/day. Please check your narcotic. If you have acetaminophen in your narcotic (each tablet will be 325mg ), be careful not to exceed a total of 3000mg /day of acetaminophen.  - A prescription for anti-nausea medication will be provided in case the narcotic medicine causes nausea - take 1 tablet every 6 hours only if nauseated.  - Take enteric coated aspirin 325 mg once daily for 2 weeks to prevent blood clots.   6. Bandages: You may remove the bandages after 5 days. Drainage from the bandages (clear/reddish) can frequently occur. If this does occur, you may remove the dressing and apply another sterile dressing. You can shower after bandages are removed.    7. Physical Therapy: not needed   8. Work: May return to full work usually around 2 weeks after 1st post-operative visit. May  do light duty/desk job in approximately 1-2 weeks when off of narcotics, pain is well-controlled, and swelling has decreased. Labor intensive jobs may require 4-6 weeks to return.      9. Post-Op Appointments: Your first post-op appointment will be with Dr. Allena Katz in approximately 2 weeks time.    If you find that they have not been scheduled please call the Orthopaedic Appointment front desk at [phone].  POLAR CARE INFORMATION  MassAdvertisement.[email or web address]  How to use [employer] Polar Care [facility] Therapy System?  YouTube   ShippingScam.co.uk  OPERATING INSTRUCTIONS  Start the product With dry hands, connect the transformer to the electrical connection located on the top of the cooler. Next, plug the transformer into an appropriate electrical outlet. The unit will automatically start running at this point.  To stop the pump, disconnect electrical power.  Unplug to stop the product when not in use. Unplugging the Polar Care unit turns it off. Always unplug immediately after use. Never leave it plugged in while unattended. Remove pad.    FIRST ADD WATER TO FILL LINE, THEN ICE---Replace ice when existing ice is almost melted  1 Discuss Treatment with your Licensed Health Care Practitioner and Use Only as Prescribed 2 Apply Insulation Barrier & Cold Therapy Pad 3 Check for Moisture 4 Inspect Skin Regularly  Tips and Trouble Shooting Usage Tips 1. Use cubed or chunked ice for optimal performance. 2. It is recommended to drain the Pad between uses. To drain the  pad, hold the Pad upright with the hose pointed toward the ground. Depress the black plunger and allow water to drain out. 3. You may disconnect the Pad from the unit without removing the pad from the affected area by depressing the silver tabs on the hose coupling and gently pulling the hoses apart. The Pad and unit will seal itself and will not leak. Note: Some dripping during release is normal. 4. DO NOT RUN PUMP  WITHOUT WATER! The pump in this unit is designed to run with water. Running the unit without water will cause permanent damage to the pump. 5. Unplug unit before removing lid.  TROUBLESHOOTING GUIDE Pump not running, Water not flowing to the pad, Pad is not getting cold 1. Make sure the transformer is plugged into the wall outlet. 2. Confirm that the ice and water are filled to the indicated levels. 3. Make sure there are no kinks in the pad. 4. Gently pull on the blue tube to make sure the tube/pad junction is straight. 5. Remove the pad from the treatment site and ll it while the pad is lying at; then reapply. 6. Confirm that the pad couplings are securely attached to the unit. Listen for the double clicks (Figure 1) to confirm the pad couplings are securely attached.  Leaks    Note: Some condensation on the lines, controller, and pads is unavoidable, especially in warmer climates. 1. If using a [employer] Polar Care Cold Therapy unit with a detachable Cold Therapy Pad, and a leak exists (other than condensation on the lines) disconnect the pad couplings. Make sure the silver tabs on the couplings are depressed before reconnecting the pad to the pump hose; then confirm both sides of the coupling are properly clicked in. 2. If the coupling continues to leak or a leak is detected in the pad itself, stop using it and call [employer] Customer Care at [phone].  Cleaning After use, empty and dry the unit with a soft cloth. Warm water and mild detergent may be used occasionally to clean the pump and tubes.  WARNING: The Polar Care Cube can be cold enough to cause serious injury, including full skin necrosis. Follow these Operating Instructions, and carefully read the Product Insert (see pouch on side of unit) and the Cold Therapy Pad Fitting Instructions (provided with each Cold Therapy Pad) prior to use.

## 2024-02-07 NOTE — Progress Notes (Signed)
 Pt CBG: 212. Dr. Laural Benes notified. Acknowledged. Orders received. See [facility]

## 2024-02-07 NOTE — Op Note (Signed)
 DATE OF SURGERY:  02/07/2024   PREOPERATIVE DIAGNOSIS:  1. Left hip trochanteric bursitis 2. Left hip iliotibial band tightness   POSTOPERATIVE DIAGNOSIS:  1. Left hip trochanteric bursitis 2. Left hip iliotibial band tightness   PROCEDURE:  1. Left hip endoscopic trochanteric bursectomy 2. Left hip endoscopic iliotibial band release  SURGEON: Rosealee Albee, MD  ASSISTANT: Sonny Dandy, PA    EBL: 5cc  ANESTHESIA: Gen  IMPLANTS: none  INDICATION(S): The patient is a 68 y.o. female who presents with persistent lateral sided L hip pain.  She underwent a left THA by Dr. Rosita Kea on 02/24/2021.  She had some pain sensations after the surgery both in a radicular nature and has undergone L5-S1 epidural steroid injection with a few days of relief.  She is also evaluated by the neurosurgery team and recommendation was for possible spinal cord stimulator, but she did not receive appropriate pain relief with this.  She is also had persistent lateral sided hip pain that is more problematic than the radicular symptoms.  She underwent an ultrasound-guided trochanteric bursa injection, and this provided excellent relief of her symptoms, but only for few hours.  Preoperative MRI did not show a hip abductor tear.  The patient has failed all non-operative care to date including multiple corticosteroid injections, physical therapy/exercises, medications, and activity modification.  Please see the preoperative notes for further detail.   The patient elected to undergo the above mentioned procedure after detailed explanation of the expected outcomes and recovery path and after discussion of risks, benefits, and alternatives to surgery.  OPERATIVE FINDINGS:  Significant trochanteric bursitis and scarring of the superior IT band to the vastus/gluteus musculature. Intact gluteus medius tendon insertion.  OPERATIVE REPORT:   [name] was brought to the operating room and underwent anesthesia. The  patient was placed in a supine fashion on the Hana table.  The operative extremity was flexed approximately 10 degrees and abducted approximately 30 degrees.  The foot was internally rotated. All bony prominences were padded.  Appropriate IV antibiotics were administered.  The patient was prepped and draped in a sterile fashion.  Time-out was performed and landmarks were identified with fluoroscopic assistance.  Needle localization with fluoroscopy was used to make an anterior portal.  This was made just distal to the standard anterolateral portal at the level of the tip of the greater trochanter.  The blunt trocar was inserted deep to the IT band and along the greater trochanter.  The peritrochanteric space was opened with a blunt trocar.  Appropriate positioning was confirmed with fluoroscopy.  A 70 degree knee arthroscope was used for this procedure, and it was inserted.  Next a distal anterolateral portal was established approximately 6cm distal to the anterior portal just anterior to the IT band and greater trochanter. This was also done under needle localization to ensure appropriate trajectory.  A shaver was introduced.   Additionally, the superior portion of the IT band was scarred to the vastus lateralis distally and the gluteus medius muscle proximally.  The IT band was released using an oscillating shaver and ArthroCare wand.  Next, the significant amount of trochanteric bursa was excised with both the oscillating shaver and ArthroCare wand. After excision and debridement of the bursa, the gluteal sling, vastus lateralis, and gluteus medius/minimus insertion were well visualized.  There was no significant gluteus medius tear upon probing the insertion.  Appropriate hemostasis was achieved.  Arthroscopic fluid was then evacuated from the joint.    Local  anesthetic was injected.  Portal sites were closed with 2-0 Vicryl and 3-0 nylon sutures.  Sterile dressing was applied.  The patient was awakened from  anesthesia without difficulty and transferred to PACU in stable condition.   Of note, assistance from a PA was essential to performing the surgery.  PA was present for the entire surgery.  PA assisted with patient positioning, retraction, instrumentation, and wound closure. The surgery would have been more difficult and had longer operative time without PA assistance.    POSTOPERATIVE PLAN:  Protected weightbearing with walker x 1 week, WBAT with walker/crutches for 2nd week.  Can wean from assistive device afterwards.  ASA 325 mg daily x2 weeks for DVT prophylaxis.  Follow-up with in approximately 2 weeks for postoperative visit.

## 2024-02-07 NOTE — Anesthesia Procedure Notes (Signed)
 Procedure Name: Intubation Date/Time: 02/07/2024 7:35 AM  Performed by: Lily Lovings, CRNAPre-anesthesia Checklist: Patient identified, Patient being monitored, Timeout performed, Emergency Drugs available and Suction available Patient Re-evaluated:Patient Re-evaluated prior to induction Oxygen Delivery Method: Circle system utilized Preoxygenation: Pre-oxygenation with 100% oxygen Induction Type: IV induction, Rapid sequence and Cricoid Pressure applied Laryngoscope Size: 3 and McGrath Grade View: Grade I Tube type: Oral Tube size: 7.0 mm Number of attempts: 1 Airway Equipment and Method: Stylet Placement Confirmation: ETT inserted through vocal cords under direct vision, positive ETCO2 and breath sounds checked- equal and bilateral Secured at: 21 cm Tube secured with: Tape Dental Injury: Teeth and Oropharynx as per pre-operative assessment

## 2024-02-08 NOTE — Anesthesia Postprocedure Evaluation (Signed)
 Anesthesia Post Note  Patient: Hailey Howard  Procedure(s) Performed: RELEASE, ILIOTIBIAL BAND, ARTHROSCOPIC, TROCHANTERIC BURSECTOMY (Left: Hip)  Patient location during evaluation: PACU Anesthesia Type: General Level of consciousness: awake and alert Pain management: pain level controlled Vital Signs Assessment: post-procedure vital signs reviewed and stable Respiratory status: spontaneous breathing, nonlabored ventilation and respiratory function stable Cardiovascular status: blood pressure returned to baseline and stable Postop Assessment: no apparent nausea or vomiting Anesthetic complications: no   No notable events documented.   Last Vitals:  Vitals:   02/07/24 1015 02/07/24 1033  BP: (!) 140/58 (!) 152/60  Pulse: 62 66  Resp: 11 15  Temp: (!) 36.1 C (!) 36.1 C  SpO2: 98% 100%    Last Pain:  Vitals:   02/07/24 1033  TempSrc: Temporal  PainSc: 0-No pain                 Foye Deer

## 2024-02-10 ENCOUNTER — Encounter: Payer: Self-pay | Admitting: Nurse Practitioner

## 2024-02-10 ENCOUNTER — Telehealth: Admitting: Nurse Practitioner

## 2024-02-10 ENCOUNTER — Encounter: Payer: Self-pay | Admitting: Internal Medicine

## 2024-02-10 DIAGNOSIS — L509 Urticaria, unspecified: Secondary | ICD-10-CM

## 2024-02-10 MED ORDER — FAMOTIDINE 20 MG PO TABS
20.0000 mg | ORAL_TABLET | Freq: Two times a day (BID) | ORAL | 0 refills | Status: DC
Start: 2024-02-10 — End: 2024-08-31

## 2024-02-10 MED ORDER — PREDNISONE 10 MG (21) PO TBPK: ORAL_TABLET | ORAL | 0 refills | Status: DC

## 2024-02-10 NOTE — Progress Notes (Signed)
 Name: Hailey Howard   MRN: [id number]    DOB: [birth year]   Date:02/10/2024       Progress Note  Subjective  Chief Complaint  Chief Complaint  Patient presents with   Urticaria    Past 2 weeks mostly at night taking bendryl    I connected with  CORLEEN OTWELL  on 02/10/24 at  2:00 PM EDT by a video enabled telemedicine application and verified that I am speaking with the correct person using two identifiers.  I discussed the limitations of evaluation and management by telemedicine and the availability of in person appointments. The patient expressed understanding and agreed to proceed with a virtual visit  Staff also discussed with the patient that there may be a patient responsible charge related to this service. Patient Location: home Provider Location: [facility] Additional Individuals present: alone  HPI  Discussed the use of AI scribe software for clinical note transcription with the patient, who gave verbal consent to proceed.  History of Present Illness Hailey Howard is a 68 year old female who presents with hives.  She has been experiencing hives, particularly at night, which disrupt her sleep. The hives appear in various locations, including her arms and between her fingers, and have been occurring regularly for the past two weeks.  She has a history of allergies and has undergone allergy testing, which revealed she is allergic to numerous substances, including sand. There have been no recent changes in soaps or medications.  For management, she has been taking generic Benadryl and allergy tablets similar to Claritin. She reports tolerating steroids well in the past.    Patient Active Problem List   Diagnosis Date Noted   History of thyroid cancer 11/30/2023   Lumbar radiculopathy 07/26/2023   Spinal stenosis, lumbar region, with neurogenic claudication 07/26/2023   Chronic painful diabetic neuropathy (HCC) 07/26/2023   Chronic pain syndrome 07/26/2023   History  of iron deficiency anemia 02/18/2023   S/P hip replacement 02/24/2021   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   Laryngospasms 10/21/2020   Hoarseness 10/21/2020   Dysphagia 10/21/2020   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/05/2020   S/P total thyroidectomy 03/14/2020   Multinodular goiter 03/04/2020   Chronic abdominal pain 11/29/2019   Anemia of chronic kidney failure, stage 3 (moderate) (HCC) 08/17/2019   Abdominal pain, recurrent 07/01/2019   Irritable bowel syndrome with constipation 07/01/2019   IDA (iron deficiency anemia) 06/29/2019   Type 2 diabetes mellitus with both eyes affected by mild nonproliferative retinopathy without macular edema, with long-term current use of insulin (HCC) 09/12/2018   Type 2 diabetes mellitus with vascular disease (HCC) 09/12/2018   Uncontrolled type 2 diabetes mellitus with hyperglycemia (HCC) 09/12/2018   Morbid obesity (HCC) 09/12/2018   Elevation of muscle enzyme 08/30/2018   Elevated erythrocyte sedimentation rate 08/30/2018   Myalgia 08/15/2018   Chronic pain of both shoulders 08/15/2018   Sacroiliitis (HCC) 02/15/2018   Schatzki's ring 06/21/2017   Chronic superficial gastritis without bleeding 06/21/2017   Gastroesophageal reflux disease without esophagitis 04/20/2017   Coronary artery disease 08/02/2016   Hyperlipidemia 08/02/2016   Hypertension 08/02/2016   Tachycardia 08/02/2016   Mesenteric mass 08/02/2016   Bursitis of left shoulder 05/07/2016   Rotator cuff tendinitis, left 05/07/2016   Impingement syndrome of left shoulder 05/07/2016   Abnormal ECG 12/24/2015   DM type 2 with diabetic peripheral neuropathy (HCC) 09/19/2015   Insulin-treated type 2 diabetes mellitus (HCC) 09/19/2015   Uncontrolled type 2 diabetes mellitus with hyperglycemia, with  long-term current use of insulin (HCC) 09/19/2015   Chronic kidney disease, stage III (moderate) (HCC) 09/10/2015   Hypokalemia 09/10/2015   Bundle branch block 09/08/2015   Edema  06/16/2015   Atrial fibrillation (HCC) 05/01/2014   A-fib (HCC) 05/01/2014   Hypothyroidism 05/01/2014    Social History   Tobacco Use   Smoking status: Never   Smokeless tobacco: Never  Substance Use Topics   Alcohol use: No     Current Outpatient Medications:    acetaminophen (TYLENOL) 500 MG tablet, Take 2 tablets (1,000 mg total) by mouth every 8 (eight) hours., Disp: 90 tablet, Rfl: 2   amLODipine (NORVASC) 5 MG tablet, Take 2.5 mg by mouth in the morning., Disp: , Rfl:    aspirin EC 325 MG tablet, Take 1 tablet (325 mg total) by mouth daily for 14 days., Disp: 14 tablet, Rfl: 0   atorvastatin (LIPITOR) 40 MG tablet, Take 1 tablet (40 mg total) by mouth daily at 12 noon., Disp: 90 tablet, Rfl: 1   Cholecalciferol 25 MCG (1000 UT) tablet, Take 1 tablet by mouth daily at 12 noon., Disp: , Rfl:    dicyclomine (BENTYL) 20 MG tablet, Take 20 mg by mouth 2 (two) times daily as needed for spasms. , Disp: , Rfl:    famotidine (PEPCID) 20 MG tablet, Take 1 tablet (20 mg total) by mouth 2 (two) times daily., Disp: 60 tablet, Rfl: 0   FIASP 100 UNIT/ML SOLN, Inject 8-16 Units into the skin See admin instructions. Inject 12 units subcutaneously in the morning, inject 16 units subcutaneously with lunch & inject 8 units subcutaneously with supper., Disp: , Rfl:    fluticasone (FLONASE) 50 MCG/ACT nasal spray, Place 1 spray into both nostrils daily as needed for allergies., Disp: , Rfl:    gabapentin (NEURONTIN) 300 MG capsule, Take 1 capsule (300 mg total) by mouth daily., Disp: 90 capsule, Rfl: 1   HYDROcodone-acetaminophen (NORCO) 10-325 MG tablet, Take 1-2 tablets by mouth every 4 (four) hours as needed., Disp: 10 tablet, Rfl: 0   insulin glargine (LANTUS SOLOSTAR) 100 UNIT/ML Solostar Pen, Inject 28 Units into the skin at bedtime., Disp: , Rfl:    Iron-Vitamin C (VITRON-C) 65-125 MG TABS, Take 1 tablet by mouth every evening., Disp: , Rfl:    levothyroxine (SYNTHROID) 137 MCG tablet, Take  137 mcg by mouth daily before breakfast., Disp: , Rfl:    linaclotide (LINZESS) 72 MCG capsule, Take 72 mcg by mouth daily as needed (constipation.)., Disp: , Rfl:    Magnesium 400 MG CAPS, Take 400 mg by mouth in the morning., Disp: , Rfl:    Menthol, Topical Analgesic, (ICY HOT EX), Apply 1 Application topically as needed (pain.)., Disp: , Rfl:    Menthol-Methyl Salicylate (SALONPAS PAIN RELIEF PATCH EX), Place 1 patch onto the skin daily as needed (pain.)., Disp: , Rfl:    metFORMIN (GLUCOPHAGE) 500 MG tablet, Take 500-1,000 mg by mouth See admin instructions. Take 1 tablet (500 mg) by mouth in the morning & take 2 tablets (1000 mg) by mouth at night., Disp: , Rfl:    metoprolol succinate (TOPROL-XL) 25 MG 24 hr tablet, Take 25 mg by mouth at bedtime., Disp: , Rfl:    Multiple Vitamin (MULTIVITAMIN WITH MINERALS) TABS tablet, Take 1 tablet by mouth daily., Disp: , Rfl:    omeprazole (PRILOSEC) 40 MG capsule, Take 40 mg by mouth in the morning., Disp: , Rfl:    Polyvinyl Alcohol-Povidone PF (REFRESH) 1.4-0.6 % SOLN, Place 1-2  drops into both eyes 3 (three) times daily as needed (dry/irritated eyes.)., Disp: , Rfl:    predniSONE (STERAPRED UNI-PAK 21 TAB) 10 MG (21) TBPK tablet, Use as directed., Disp: 21 each, Rfl: 0   Semaglutide (OZEMPIC, 0.25 OR 0.5 MG/DOSE, [place]), Inject 0.5 mg into the skin every Thursday., Disp: , Rfl:    spironolactone (ALDACTONE) 50 MG tablet, Take 50 mg by mouth at bedtime., Disp: , Rfl:    telmisartan (MICARDIS) 80 MG tablet, Take 80 mg by mouth at bedtime., Disp: , Rfl:    torsemide (DEMADEX) 10 MG tablet, Take 10 mg by mouth in the morning., Disp: , Rfl:   Allergies  Allergen Reactions   Iodinated Contrast Media Anaphylaxis, Hives and Itching   Oxycodone-Acetaminophen Hives   Etodolac Itching    I personally reviewed active problem list, medication list, allergies with the patient/caregiver today.  ROS  Ten systems reviewed and is negative except as mentioned  in HPI   Objective  Virtual encounter, vitals not obtained.  There is no height or weight on file to calculate BMI.  Nursing Note and Vital Signs reviewed.  Physical Exam  Awake, alert and oriented, speaking in complete sentences   No results found for this or any previous visit (from the past 72 hours).  Assessment & Plan  Assessment and Plan Assessment & Plan Chronic Urticaria Presents with a two-week history of generalized hives, particularly severe at night, affecting areas such as the arms and between the fingers. Allergies to multiple substances confirmed by testing. Likely due to chronic urticaria, exacerbated by extensive allergies. Current management with antihistamines like Benadryl and Claritin has been insufficient. Pepcid will be added as it is available over the counter and works in conjunction with current antihistamines. Steroids will be prescribed to reduce itchiness and modulate the immune response. - Continue current antihistamines (Benadryl or Claritin). - Initiate Pepcid twice daily  - Prescribe a steroid taper to reduce itchiness and modulate the immune response. - Request follow-up message on Monday to report on condition.    -Red flags and when to present for emergency care or RTC including fever >101.31F, chest pain, shortness of breath, new/worsening/un-resolving symptoms,  reviewed with patient at time of visit. Follow up and care instructions discussed and provided in AVS. - I discussed the assessment and treatment plan with the patient. The patient was provided an opportunity to ask questions and all were answered. The patient agreed with the plan and demonstrated an understanding of the instructions.  I provided 15 minutes of non-face-to-face time during this encounter.  Berniece Salines, FNP

## 2024-02-15 ENCOUNTER — Ambulatory Visit: Admitting: Internal Medicine

## 2024-02-16 LAB — HM DIABETES EYE EXAM

## 2024-02-24 ENCOUNTER — Encounter: Payer: Self-pay | Admitting: Orthopedic Surgery

## 2024-02-29 ENCOUNTER — Encounter: Payer: Self-pay | Admitting: Internal Medicine

## 2024-03-02 ENCOUNTER — Encounter: Payer: Self-pay | Admitting: Internal Medicine

## 2024-03-02 ENCOUNTER — Inpatient Hospital Stay: Payer: Medicare HMO | Attending: Internal Medicine

## 2024-03-02 ENCOUNTER — Inpatient Hospital Stay: Payer: Medicare HMO | Admitting: Internal Medicine

## 2024-03-02 DIAGNOSIS — N183 Chronic kidney disease, stage 3 unspecified: Secondary | ICD-10-CM | POA: Diagnosis not present

## 2024-03-02 DIAGNOSIS — Z85850 Personal history of malignant neoplasm of thyroid: Secondary | ICD-10-CM | POA: Diagnosis not present

## 2024-03-02 DIAGNOSIS — N184 Chronic kidney disease, stage 4 (severe): Secondary | ICD-10-CM | POA: Diagnosis present

## 2024-03-02 DIAGNOSIS — D631 Anemia in chronic kidney disease: Secondary | ICD-10-CM

## 2024-03-02 DIAGNOSIS — D472 Monoclonal gammopathy: Secondary | ICD-10-CM

## 2024-03-02 LAB — CBC WITH DIFFERENTIAL (CANCER CENTER ONLY)
Abs Immature Granulocytes: 0.02 10*3/uL (ref 0.00–0.07)
Basophils Absolute: 0.1 10*3/uL (ref 0.0–0.1)
Basophils Relative: 1 %
Eosinophils Absolute: 0.3 10*3/uL (ref 0.0–0.5)
Eosinophils Relative: 4 %
HCT: 34.9 % — ABNORMAL LOW (ref 36.0–46.0)
Hemoglobin: 10.7 g/dL — ABNORMAL LOW (ref 12.0–15.0)
Immature Granulocytes: 0 %
Lymphocytes Relative: 31 %
Lymphs Abs: 2.1 10*3/uL (ref 0.7–4.0)
MCH: 27.4 pg (ref 26.0–34.0)
MCHC: 30.7 g/dL (ref 30.0–36.0)
MCV: 89.5 fL (ref 80.0–100.0)
Monocytes Absolute: 0.5 10*3/uL (ref 0.1–1.0)
Monocytes Relative: 7 %
Neutro Abs: 3.9 10*3/uL (ref 1.7–7.7)
Neutrophils Relative %: 57 %
Platelet Count: 290 10*3/uL (ref 150–400)
RBC: 3.9 MIL/uL (ref 3.87–5.11)
RDW: 14.1 % (ref 11.5–15.5)
WBC Count: 6.8 10*3/uL (ref 4.0–10.5)
nRBC: 0 % (ref 0.0–0.2)

## 2024-03-02 LAB — CMP (CANCER CENTER ONLY)
ALT: 26 U/L (ref 0–44)
AST: 29 U/L (ref 15–41)
Albumin: 4 g/dL (ref 3.5–5.0)
Alkaline Phosphatase: 80 U/L (ref 38–126)
Anion gap: 12 (ref 5–15)
BUN: 30 mg/dL — ABNORMAL HIGH (ref 8–23)
CO2: 24 mmol/L (ref 22–32)
Calcium: 8.7 mg/dL — ABNORMAL LOW (ref 8.9–10.3)
Chloride: 105 mmol/L (ref 98–111)
Creatinine: 1.84 mg/dL — ABNORMAL HIGH (ref 0.44–1.00)
GFR, Estimated: 29 mL/min — ABNORMAL LOW (ref >60)
Glucose, Bld: 86 mg/dL (ref 70–99)
Potassium: 3.9 mmol/L (ref 3.5–5.1)
Sodium: 141 mmol/L (ref 135–145)
Total Bilirubin: 0.6 mg/dL (ref 0.0–1.2)
Total Protein: 7.4 g/dL (ref 6.5–8.1)

## 2024-03-02 LAB — IRON AND TIBC
Iron: 57 ug/dL (ref 28–170)
Saturation Ratios: 19 % (ref 10.4–31.8)
TIBC: 302 ug/dL (ref 250–450)
UIBC: 245 ug/dL

## 2024-03-02 LAB — FERRITIN: Ferritin: 77 ng/mL (ref 11–307)

## 2024-03-02 NOTE — Assessment & Plan Note (Addendum)
#   Anemia-hemoglobin-hemoglobin 11.6/secondary to CKD-iron deficiency.  HOLD  IV iron today.  Hold off any erythropoietin stimulating agent at this time; on Vitron C- 2 pills at qhs.  Colo- FEB 2023- stable.  #CKD stage IV- III -multifactorial diabetes hypertension obesity [GFR 32] no NSAIDs.[Diuretics; [facility]]- stable.  #S/p thyroidectomy thyroid Tumor Size: 0.8 cm in greatest dimension Histologic Type: Papillary carcinoma, classic (usual, conventional).  No role for any adjuvant therapy; followed by endocrinology- stable.   # IV access: Poor/hydration  DISPOSITION:  # NO Venofer today # in 6 months- MD; cbc/bmp; iron studies/ferritin- possible venofer- Dr.B

## 2024-03-02 NOTE — Progress Notes (Signed)
 Fatigue/weakness: NO Dyspena: NO  Light headedness: NO  Blood in stool: NO   C/o having hives x2 weeks, using benadryl, prednisone and Pepcid. Not sure what is causing them.  Had hip surgery for infected bursa 02/07/24. Hip MRI.

## 2024-03-02 NOTE — Progress Notes (Signed)
 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 [Dr.Balch; elevated ESR]; June 2020 repeat M protein-negative  #Anemia hemoglobin 9.9/CKD stage III  #  Anterior mesenteric nodule- 16x65mm [sep 2016; smaller; Feb 2016- S/p Bx- leiomyoma.]; Ilecolic LN [56mm; sep 2016; improving].2016- Octreotide scan- NEG;  CT SEP 2017- NED [elevated chromogranin  Levels- slightly]; July 2019- CT STABLE; Oct 2019- [facility]- imaging reviewed; No further surveillaince  # CKD stage III/diabetes; hemoglobin 10 [EGD-2016; colo-2018-Dr.Elliot]  #April 2021-total thyroidectomy; [Dr.Cannon] Tumor Size: 0.8 cm in greatest dimension Histologic Type: Papillary carcinoma, classic (usual, conventional); stage I; no adjuvant therapy  # Retro-tracheal duplication cyst   Thyroid cancer (HCC) (Resolved)  03/28/2020 Initial Diagnosis   Thyroid cancer (HCC)     INTERVAL HISTORY: Ambulating independently.  Alone.   68 year old female patient history of iron deficiency/ anemia sec to  chronic kidney disease is here for follow-up.  Patient had hip surgery for infected bursa 02/07/24.    C/o having hives x2 weeks, using benadryl, prednisone and Pepcid. Not sure what is causing them. No new medications.   She continues to be on iron.  Denies any blood in stools .  Denies any nausea vomiting.  Mild fatigue.  Review of Systems  Constitutional:  Positive for malaise/fatigue. Negative for chills, diaphoresis, fever and weight loss.  HENT:  Negative for nosebleeds and sore throat.   Eyes:  Negative for double vision.  Respiratory:  Negative for cough, hemoptysis, sputum production, shortness of breath and wheezing.   Cardiovascular:  Negative  for chest pain, palpitations, orthopnea and leg swelling.  Gastrointestinal:  Negative for abdominal pain, blood in stool, constipation, diarrhea, heartburn, melena, nausea and vomiting.  Genitourinary:  Negative for dysuria, frequency and urgency.  Musculoskeletal:  Positive for back pain and joint pain.  Skin: Negative.  Negative for itching and rash.  Neurological:  Negative for dizziness, tingling, focal weakness, weakness and headaches.  Endo/Heme/Allergies:  Does not bruise/bleed easily.  Psychiatric/Behavioral:  Negative for depression. The patient is not nervous/anxious and does not have insomnia.      PAST MEDICAL HISTORY :  Past Medical History:  Diagnosis Date   Anemia of chronic kidney failure, stage 3 (moderate) (HCC) 08/17/2019   Anemia of chronic renal failure    Aortic atherosclerosis (HCC)    Arthritis    Bradycardia    Cataract    CKD (chronic kidney disease), stage III (HCC)    Colon polyps    Coronary artery disease 05/03/2001   a.) LHC 05/03/2001: 95% pLCx, 95% mLAD-1, 75% mLAD-2, 95% D2 --> LAD dissection occurred during procedure --> CVTS consult; b.) s/p 3v CABG 05/03/2001: LIMA-LAD,SVG-OM2, SVG-D2; c.) LHC/PCI 02/12/2004: 25% pRCA, 25% mRCA, 95% RPDA, 25% LM, 25% pLCx, 95% mLCx (3.0 x 13 mm Cypher), 50% OM3, 25% pLAD, 100% mLAD, 25% D2, 25% LIMA-LAD, 100% SVG-OM2, 50% SVG-D2   Coronary artery dissection 05/03/2001   a.) LAD dissection occurred during diagnostic PCI at [facility]; IABP placed and patient sent for emergency CABG   Diabetic neuropathy (HCC)    Dysphagia    Exertional dyspnea    Gastritis    GERD (gastroesophageal reflux disease)    Headache    History of hiatal hernia    History of shingles  HLD (hyperlipidemia)    Hypertension    IBS (irritable bowel syndrome)    IDA (iron deficiency anemia)    a.) followed by hematology; receiving iron infusions PRN   Long-term use of aspirin therapy    Mesenteric mass    Muscle tension dysphonia    PAF  (paroxysmal atrial fibrillation) (HCC)    a.) CHA2DS2VASc = 5 (age, sex, HTN, vascular disease, T2DM) as of 02/06/2024; b.) rate/rhythm maintained on oral metoprolol succinate; no chronic OAC   Peripheral vascular disease (HCC)    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   a.) s/p total thyroidectomy for thyroid cancer   Right BBB/left ant fasc block    Rotator cuff tendinitis, left    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   a.) SVG-OM2, SVG-D2, LIMA-LAD   SVT (supraventricular tachycardia) (HCC)    T2DM (type 2 diabetes mellitus) (HCC)    Thyroid cancer (HCC)    a.) s/p total thyroidectomy 03/14/2020   Trochanteric bursitis of left hip    Vitamin D deficiency     PAST SURGICAL HISTORY :   Past Surgical History:  Procedure Laterality Date   ABDOMINAL HYSTERECTOMY     CHOLECYSTECTOMY     COLONOSCOPY WITH PROPOFOL N/A 12/20/2016   Procedure: COLONOSCOPY WITH PROPOFOL;  Surgeon: Scot Jun, MD;  Location: [facility] ENDOSCOPY;  Service: Endoscopy;  Laterality: N/A;   COLONOSCOPY WITH PROPOFOL N/A 01/19/2022   Procedure: COLONOSCOPY WITH PROPOFOL;  Surgeon: Regis Bill, MD;  Location: [facility] ENDOSCOPY;  Service: Endoscopy;  Laterality: N/A;   CORONARY ANGIOPLASTY WITH STENT PLACEMENT Left 02/12/2004   Procedure: CORONARY ANGIOPLASTY WITH STENT PLACEMENT; Location: [facility]; Surgeon: Eugenia Pancoast, MD   CORONARY ARTERY BYPASS GRAFT N/A 05/03/2001   Procedure: CORONARY ARTERY BYPASS GRAFT; Location: [facility]; Surgeon: Flint Melter, MD   ESOPHAGOGASTRODUODENOSCOPY N/A 03/15/2023   Procedure: ESOPHAGOGASTRODUODENOSCOPY (EGD);  Surgeon: Regis Bill, MD;  Location: [facility] ENDOSCOPY;  Service: Endoscopy;  Laterality: N/A;   ESOPHAGOGASTRODUODENOSCOPY (EGD) WITH PROPOFOL N/A 04/27/2017   Procedure: ESOPHAGOGASTRODUODENOSCOPY (EGD) WITH PROPOFOL;  Surgeon: Scot Jun, MD;  Location: [facility] ENDOSCOPY;  Service: Endoscopy;  Laterality: N/A;   EUS N/A 04/24/2015   Procedure: ESOPHAGEAL ENDOSCOPIC  ULTRASOUND (EUS) RADIAL;  Surgeon: Wayland Salinas, MD;  Location: [facility] ENDOSCOPY;  Service: Endoscopy;  Laterality: N/A;   LEFT HEART CATH AND CORONARY ANGIOGRAPHY Left 05/03/2001   Procedure: LEFT HEART CATH AND CORONARY ANGIOGRAPHY; Location: [facility]; Surgeon: Emilio Math, MD   TOTAL HIP ARTHROPLASTY Left 02/24/2021   Procedure: TOTAL HIP ARTHROPLASTY ANTERIOR APPROACH;  Surgeon: Kennedy Bucker, MD;  Location: [facility] ORS;  Service: Orthopedics;  Laterality: Left;   TOTAL THYROIDECTOMY N/A 03/14/2020   Procedure: TOTAL THYROIDECTOMY; Location: [facility]    FAMILY HISTORY :   Family History  Problem Relation Age of Onset   Arthritis Sister    Heart disease Sister    Arthritis Sister    Arthritis Sister    Breast cancer Neg Hx     SOCIAL HISTORY:   Social History   Tobacco Use   Smoking status: Never   Smokeless tobacco: Never  Vaping Use   Vaping status: Never Used  Substance Use Topics   Alcohol use: No   Drug use: No    ALLERGIES:  is allergic to iodinated contrast media, oxycodone-acetaminophen, and etodolac.  MEDICATIONS:  Current Outpatient Medications  Medication Sig Dispense Refill   acetaminophen (TYLENOL) 500 MG tablet Take 2 tablets (1,000 mg total) by mouth every 8 (eight) hours. 90 tablet 2   amLODipine (  NORVASC) 5 MG tablet Take 2.5 mg by mouth in the morning.     atorvastatin (LIPITOR) 40 MG tablet Take 1 tablet (40 mg total) by mouth daily at 12 noon. 90 tablet 1   Cholecalciferol 25 MCG (1000 UT) tablet Take 1 tablet by mouth daily at 12 noon.     dicyclomine (BENTYL) 20 MG tablet Take 20 mg by mouth 2 (two) times daily as needed for spasms.      famotidine (PEPCID) 20 MG tablet Take 1 tablet (20 mg total) by mouth 2 (two) times daily. 60 tablet 0   FIASP 100 UNIT/ML SOLN Inject 8-16 Units into the skin See admin instructions. Inject 12 units subcutaneously in the morning, inject 16 units subcutaneously with lunch & inject 8 units subcutaneously with supper.      fluticasone (FLONASE) 50 MCG/ACT nasal spray Place 1 spray into both nostrils daily as needed for allergies.     gabapentin (NEURONTIN) 300 MG capsule Take 1 capsule (300 mg total) by mouth daily. 90 capsule 1   insulin glargine (LANTUS SOLOSTAR) 100 UNIT/ML Solostar Pen Inject 28 Units into the skin at bedtime.     Iron-Vitamin C (VITRON-C) 65-125 MG TABS Take 1 tablet by mouth every evening.     levothyroxine (SYNTHROID) 137 MCG tablet Take 137 mcg by mouth daily before breakfast.     linaclotide (LINZESS) 72 MCG capsule Take 72 mcg by mouth daily as needed (constipation.).     Magnesium 400 MG CAPS Take 400 mg by mouth in the morning.     Menthol, Topical Analgesic, (ICY HOT EX) Apply 1 Application topically as needed (pain.).     Menthol-Methyl Salicylate (SALONPAS PAIN RELIEF PATCH EX) Place 1 patch onto the skin daily as needed (pain.).     metFORMIN (GLUCOPHAGE) 500 MG tablet Take 500-1,000 mg by mouth See admin instructions. Take 1 tablet (500 mg) by mouth in the morning & take 2 tablets (1000 mg) by mouth at night.     metoprolol succinate (TOPROL-XL) 25 MG 24 hr tablet Take 25 mg by mouth at bedtime.     Multiple Vitamin (MULTIVITAMIN WITH MINERALS) TABS tablet Take 1 tablet by mouth daily.     omeprazole (PRILOSEC) 40 MG capsule Take 40 mg by mouth in the morning.     Semaglutide (OZEMPIC, 0.25 OR 0.5 MG/DOSE, [place]) Inject 0.5 mg into the skin every Thursday.     spironolactone (ALDACTONE) 50 MG tablet Take 50 mg by mouth at bedtime.     telmisartan (MICARDIS) 80 MG tablet Take 80 mg by mouth at bedtime.     torsemide (DEMADEX) 10 MG tablet Take 10 mg by mouth in the morning.     No current facility-administered medications for this visit.    PHYSICAL EXAMINATION: ECOG PERFORMANCE STATUS: 0 - Asymptomatic  BP (!) 128/58 (BP Location: Right Arm, Patient Position: Sitting, Cuff Size: Large)   Pulse 84   Temp 98 F (36.7 C) (Tympanic)   Resp 20   Ht 5\' 7"  (1.702 m)   Wt 247 lb  3.2 oz (112.1 kg)   SpO2 100%   BMI 38.72 kg/m   Filed Weights   03/02/24 1319  Weight: 247 lb 3.2 oz (112.1 kg)    Physical Exam Constitutional:      Comments: Patient is alone.  HENT:     Head: Normocephalic and atraumatic.     Mouth/Throat:     Pharynx: No oropharyngeal exudate.  Eyes:     Pupils: Pupils  are equal, round, and reactive to light.  Cardiovascular:     Rate and Rhythm: Normal rate and regular rhythm.  Pulmonary:     Effort: No respiratory distress.     Breath sounds: No wheezing.  Abdominal:     General: Bowel sounds are normal. There is no distension.     Palpations: Abdomen is soft. There is no mass.     Tenderness: There is no abdominal tenderness. There is no guarding or rebound.  Musculoskeletal:        General: No tenderness. Normal range of motion.     Cervical back: Normal range of motion and neck supple.  Skin:    General: Skin is warm.  Neurological:     Mental Status: She is alert and oriented to person, place, and time.  Psychiatric:        Mood and Affect: Affect normal.      LABORATORY DATA:  I have reviewed the data as listed    Component Value Date/Time   NA 141 03/02/2024 1304   NA 136 03/10/2015 1640   K 3.9 03/02/2024 1304   K 2.7 (L) 03/10/2015 1640   CL 105 03/02/2024 1304   CL 96 (L) 03/10/2015 1640   CO2 24 03/02/2024 1304   CO2 31 03/10/2015 1640   GLUCOSE 86 03/02/2024 1304   GLUCOSE 99 03/10/2015 1640   BUN 30 (H) 03/02/2024 1304   BUN 16 03/10/2015 1640   CREATININE 1.84 (H) 03/02/2024 1304   CREATININE 1.98 (H) 06/07/2023 1123   CALCIUM 8.7 (L) 03/02/2024 1304   CALCIUM 9.0 03/10/2015 1640   PROT 7.4 03/02/2024 1304   PROT 7.7 03/10/2015 1640   ALBUMIN 4.0 03/02/2024 1304   ALBUMIN 4.1 03/10/2015 1640   AST 29 03/02/2024 1304   ALT 26 03/02/2024 1304   ALT 39 03/10/2015 1640   ALKPHOS 80 03/02/2024 1304   ALKPHOS 60 03/10/2015 1640   BILITOT 0.6 03/02/2024 1304   GFRNONAA 29 (L) 03/02/2024 1304    GFRNONAA 50 (L) 03/10/2015 1640   GFRAA 39 (L) 07/25/2020 1252   GFRAA 58 (L) 03/10/2015 1640    No results found for: "SPEP", "UPEP"  Lab Results  Component Value Date   WBC 6.8 03/02/2024   NEUTROABS 3.9 03/02/2024   HGB 10.7 (L) 03/02/2024   HCT 34.9 (L) 03/02/2024   MCV 89.5 03/02/2024   PLT 290 03/02/2024      Chemistry      Component Value Date/Time   NA 141 03/02/2024 1304   NA 136 03/10/2015 1640   K 3.9 03/02/2024 1304   K 2.7 (L) 03/10/2015 1640   CL 105 03/02/2024 1304   CL 96 (L) 03/10/2015 1640   CO2 24 03/02/2024 1304   CO2 31 03/10/2015 1640   BUN 30 (H) 03/02/2024 1304   BUN 16 03/10/2015 1640   CREATININE 1.84 (H) 03/02/2024 1304   CREATININE 1.98 (H) 06/07/2023 1123      Component Value Date/Time   CALCIUM 8.7 (L) 03/02/2024 1304   CALCIUM 9.0 03/10/2015 1640   ALKPHOS 80 03/02/2024 1304   ALKPHOS 60 03/10/2015 1640   AST 29 03/02/2024 1304   ALT 26 03/02/2024 1304   ALT 39 03/10/2015 1640   BILITOT 0.6 03/02/2024 1304      ASSESSMENT & PLAN:   Anemia of chronic kidney failure, stage 3 (moderate) (HCC) # Anemia-hemoglobin-hemoglobin 11.6/secondary to CKD-iron deficiency.  HOLD  IV iron today.  Hold off any erythropoietin stimulating agent at this  time; on Vitron C- 2 pills at qhs.  [facility]- FEB 2023-  #CKD stage IV- III [GFR 32] no NSAIDs.[ Lasix q OD; [facility]]- stable.  #S/p thyroidectomy thyroid Tumor Size: 0.8 cm in greatest dimension Histologic Type: Papillary carcinoma, classic (usual, conventional).  No role for any adjuvant therapy; followed by endocrinology- stable.   # IV access: Poor/hydration  DISPOSITION:  # NO Venofer today # in 6 months- MD; cbc/bmp; iron studies/ferritin- possible venofer- Dr.B      Earna Coder, MD 03/02/2024 2:00 PM

## 2024-03-05 LAB — PROTEIN ELECTROPHORESIS, SERUM
A/G Ratio: 1.3 (ref 0.7–1.7)
Albumin ELP: 3.8 g/dL (ref 2.9–4.4)
Alpha-1-Globulin: 0.2 g/dL (ref 0.0–0.4)
Alpha-2-Globulin: 0.9 g/dL (ref 0.4–1.0)
Beta Globulin: 1.2 g/dL (ref 0.7–1.3)
Gamma Globulin: 0.8 g/dL (ref 0.4–1.8)
Globulin, Total: 3 g/dL (ref 2.2–3.9)
Total Protein ELP: 6.8 g/dL (ref 6.0–8.5)

## 2024-03-05 LAB — KAPPA/LAMBDA LIGHT CHAINS
Kappa free light chain: 24 mg/L — ABNORMAL HIGH (ref 3.3–19.4)
Kappa, lambda light chain ratio: 1.56 (ref 0.26–1.65)
Lambda free light chains: 15.4 mg/L (ref 5.7–26.3)

## 2024-03-12 ENCOUNTER — Ambulatory Visit (INDEPENDENT_AMBULATORY_CARE_PROVIDER_SITE_OTHER): Admitting: Internal Medicine

## 2024-03-12 VITALS — BP 142/80 | HR 74 | Resp 18 | Ht 67.0 in | Wt 245.5 lb

## 2024-03-12 DIAGNOSIS — M9908 Segmental and somatic dysfunction of rib cage: Secondary | ICD-10-CM | POA: Diagnosis not present

## 2024-03-12 DIAGNOSIS — L509 Urticaria, unspecified: Secondary | ICD-10-CM | POA: Diagnosis not present

## 2024-03-12 MED ORDER — FEXOFENADINE HCL 180 MG PO TABS: 180.0000 mg | ORAL_TABLET | Freq: Every day | ORAL | 1 refills | Status: AC

## 2024-03-12 NOTE — Patient Instructions (Addendum)
 It was great seeing you today!  Plan discussed at today's visit: -Discontinue Famotidine  - start daily antihistamine like Zyrtec, Allegra , Claritin and Xyzal can get over the counter/generic -Continue daily moisturizing  -If it happens again, can take Benadryl  and restart the Famotidine  and topical Benadryl   -Call chiropractor to be assessed for rib dysfunction  Follow up in: already scheduled in July   Take care and let us  know if you have any questions or concerns prior to your next visit.  Dr. Bud Care

## 2024-03-12 NOTE — Progress Notes (Signed)
 Acute Office Visit  Subjective:     Patient ID: Hailey Howard, female    DOB: 1955-12-15, 68 y.o.   MRN: [id number]  Chief Complaint  Patient presents with   Urticaria    Hives and itching x 3 weeks    Urticaria Pertinent negatives include no fever or shortness of breath.   Patient is in today for follow up on hives.   Discussed the use of AI scribe software for clinical note transcription with the patient, who gave verbal consent to proceed.  History of Present Illness The patient, with a history of skin allergies, presents with a recent outbreak of hives and itchiness. The patient reports no changes in personal care products or medications that could have triggered the outbreak. The patient was previously prescribed prednisone  and famotidine  for the condition. However, the prednisone  was discontinued due to elevated blood sugar levels. The hives have been primarily located on the patient's arms, back, and around the elbows. The patient has been managing the itchiness with over-the-counter allergy medications, including a generic form of Benadryl .  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 The pain is localized in the rib area and has been present for several years. The patient reports tenderness and discomfort in the area, particularly when taking deep breaths.    Review of Systems  Constitutional:  Negative for chills and fever.  Respiratory:  Negative for shortness of breath.   Skin:  Negative for itching and rash.        Objective:    BP (!) 142/80   Pulse 74   Resp 18   Ht 5\' 7"  (1.702 m)   Wt 245 lb 8 oz (111.4 kg)   SpO2 100%   BMI 38.45 kg/m  BP Readings from Last 3 Encounters:  03/12/24 (!) 142/80  03/02/24 (!) 128/58  02/07/24 (!) 152/60   Wt Readings from Last 3 Encounters:  03/12/24 245 lb 8 oz (111.4 kg)  03/02/24 247 lb 3.2 oz (112.1 kg)  01/30/24 245 lb (111.1 kg)       Physical Exam Constitutional:      Appearance: Normal appearance.  HENT:     Head: Normocephalic and atraumatic.  Eyes:     Conjunctiva/sclera: Conjunctivae normal.  Cardiovascular:     Rate and Rhythm: Normal rate and regular rhythm.  Pulmonary:     Effort: Pulmonary effort is normal.     Breath sounds: Normal breath sounds.  Musculoskeletal:        General: Tenderness present. No swelling.     Comments: Mild inhalation dysfunction of the 9-10 ribs on the right  Skin:    General: Skin is warm and dry.     Findings: No rash.     Comments: Healing areas on arms where hives were, no active outbreak.   Neurological:     General: No focal deficit present.     Mental Status: She is alert. Mental status is at baseline.  Psychiatric:        Mood and Affect: Mood normal.        Behavior: Behavior normal.     No results found for any visits on 03/12/24.      Assessment & Plan:   Assessment & Plan Urticaria Hives primarily on arms, back, elbows but healing. Etiology unclear, possible environmental triggers. Previous prednisone  elevated glucose. Benadryl  causes drowsiness. - Discontinue famotidine , restart if hives return. - Initiate daily non-sedating antihistamine  such as Zyrtec, Allegra , Claritin, or Xyzal. - Prescribe Allegra , sent to [facility]. - Use Benadryl  for severe pruritus. - Consider steroid injection if hives become widespread, with caution due to potential hyperglycemia. - Apply fragrance-free moisturizer like Nivea cream for skin hydration.  Rib Dysfunction Chronic right-sided rib pain, possibly due to rib dysfunction or displacement causing inflammation and muscular pain. - Refer to a chiropractor for evaluation and potential rib manipulation. - Consider anti-inflammatory medications and heat pads for acute muscular strain.  Follow-up - Follow up in July as scheduled. - Contact clinic if symptoms worsen or concerns arise before the next appointment.  -  fexofenadine  (ALLEGRA  ALLERGY) 180 MG tablet; Take 1 tablet (180 mg total) by mouth daily.  Dispense: 90 tablet; Refill: 1   Return for already scheduled.  Rockney Cid, DO

## 2024-03-15 LAB — HEMOGLOBIN A1C: Hemoglobin A1C: 7.4

## 2024-03-16 ENCOUNTER — Encounter: Payer: Self-pay | Admitting: Internal Medicine

## 2024-03-20 ENCOUNTER — Encounter: Payer: Self-pay | Admitting: Internal Medicine

## 2024-03-20 ENCOUNTER — Other Ambulatory Visit: Payer: Self-pay

## 2024-03-20 ENCOUNTER — Ambulatory Visit (INDEPENDENT_AMBULATORY_CARE_PROVIDER_SITE_OTHER): Admitting: Internal Medicine

## 2024-03-20 VITALS — BP 124/72 | HR 98 | Temp 98.3°F | Resp 16 | Ht 67.0 in | Wt 244.4 lb

## 2024-03-20 DIAGNOSIS — L509 Urticaria, unspecified: Secondary | ICD-10-CM

## 2024-03-20 NOTE — Progress Notes (Signed)
   Acute Office Visit  Subjective:     Patient ID: TAMERAH RICKARDS, female    DOB: Feb 27, 1956, 68 y.o.   MRN: [id number]  Chief Complaint  Patient presents with   Rash    Itching for 3 weeks    HPI Patient is in today for continued rash/itching.   Discussed the use of AI scribe software for clinical note transcription with the patient, who gave verbal consent to proceed.  History of Present Illness MARIADELROSARIO ELY is a 68 year old female who presents with a persistent itchy rash.  She experiences persistent itching and palpable lumps under her skin, especially on her arm. Allegra  180 mg and Pepcid  provide partial relief, but itching persists. Prednisone  previously reduced the rash but elevated her blood sugar, leading to its discontinuation. Benadryl  is used occasionally for severe itching, though it causes drowsiness. She has discontinued turmeric supplements, suspecting they worsened her symptoms, and has noticed some improvement since stopping. Her current regimen includes Allegra , Pepcid , and Benadryl  as needed, along with moisturizing and Benadryl  creams. No new symptoms or changes in the rash are noted.    Review of Systems  Constitutional:  Negative for chills and fever.  Skin:  Positive for itching and rash.        Objective:    BP 124/72 (Cuff Size: Large)   Pulse 98   Temp 98.3 F (36.8 C) (Oral)   Resp 16   Ht 5\' 7"  (1.702 m)   Wt 244 lb 6.4 oz (110.9 kg)   SpO2 99%   BMI 38.28 kg/m    Physical Exam Constitutional:      Appearance: Normal appearance.  HENT:     Head: Normocephalic and atraumatic.  Eyes:     Conjunctiva/sclera: Conjunctivae normal.  Cardiovascular:     Rate and Rhythm: Normal rate and regular rhythm.  Pulmonary:     Effort: Pulmonary effort is normal.     Breath sounds: Normal breath sounds.  Skin:    General: Skin is warm and dry.     Findings: Rash present.     Comments: Rash consistent with healing hives on right arm   Neurological:     General: No focal deficit present.     Mental Status: She is alert. Mental status is at baseline.  Psychiatric:        Mood and Affect: Mood normal.        Behavior: Behavior normal.     No results found for any visits on 03/20/24.      Assessment & Plan:   Assessment & Plan Hives Persistent rash with itching, less severe with current antihistamine regimen. Possible histamine-mediated etiology. Discussed dermatology referral and steroid injection risks due to hyperglycemia. - Continue Allegra  180 mg daily. - Continue Pepcid  daily. - Use Benadryl  as needed for severe itching. - Apply moisturizing creams and Benadryl  cream for relief. - Provide sample of non-steroidal cream (Zoryve) for trial. - Contact dermatologist for further evaluation. - Consider steroid injection if rash worsens, with caution regarding blood sugar impact.  Elevated blood sugar due to prednisone  Hyperglycemia secondary to prednisone . A1c at 7.4, suboptimal. Avoidance of steroids advised. - Avoid prednisone  and other steroids to prevent hyperglycemia.   Return for already scheduled.  Rockney Cid, DO

## 2024-03-22 ENCOUNTER — Encounter: Payer: Self-pay | Admitting: Internal Medicine

## 2024-03-28 DIAGNOSIS — L501 Idiopathic urticaria: Secondary | ICD-10-CM | POA: Diagnosis not present

## 2024-03-29 DIAGNOSIS — L501 Idiopathic urticaria: Secondary | ICD-10-CM | POA: Diagnosis not present

## 2024-04-02 DIAGNOSIS — I2510 Atherosclerotic heart disease of native coronary artery without angina pectoris: Secondary | ICD-10-CM | POA: Diagnosis not present

## 2024-04-02 DIAGNOSIS — I9789 Other postprocedural complications and disorders of the circulatory system, not elsewhere classified: Secondary | ICD-10-CM | POA: Diagnosis not present

## 2024-04-02 DIAGNOSIS — Z951 Presence of aortocoronary bypass graft: Secondary | ICD-10-CM | POA: Diagnosis not present

## 2024-04-02 DIAGNOSIS — I10 Essential (primary) hypertension: Secondary | ICD-10-CM | POA: Diagnosis not present

## 2024-04-02 DIAGNOSIS — I2583 Coronary atherosclerosis due to lipid rich plaque: Secondary | ICD-10-CM | POA: Diagnosis not present

## 2024-04-02 DIAGNOSIS — R9431 Abnormal electrocardiogram [ECG] [EKG]: Secondary | ICD-10-CM | POA: Diagnosis not present

## 2024-04-02 DIAGNOSIS — E119 Type 2 diabetes mellitus without complications: Secondary | ICD-10-CM | POA: Diagnosis not present

## 2024-04-02 DIAGNOSIS — Z7982 Long term (current) use of aspirin: Secondary | ICD-10-CM | POA: Diagnosis not present

## 2024-04-02 DIAGNOSIS — I480 Paroxysmal atrial fibrillation: Secondary | ICD-10-CM | POA: Diagnosis not present

## 2024-04-02 DIAGNOSIS — Z955 Presence of coronary angioplasty implant and graft: Secondary | ICD-10-CM | POA: Diagnosis not present

## 2024-04-02 DIAGNOSIS — E785 Hyperlipidemia, unspecified: Secondary | ICD-10-CM | POA: Diagnosis not present

## 2024-04-26 ENCOUNTER — Other Ambulatory Visit: Payer: Self-pay

## 2024-04-26 ENCOUNTER — Encounter: Payer: Self-pay | Admitting: Internal Medicine

## 2024-04-26 DIAGNOSIS — E1142 Type 2 diabetes mellitus with diabetic polyneuropathy: Secondary | ICD-10-CM

## 2024-04-26 MED ORDER — GABAPENTIN 300 MG PO CAPS: 300.0000 mg | ORAL_CAPSULE | Freq: Every day | ORAL | 0 refills | Status: DC

## 2024-05-07 DIAGNOSIS — M48062 Spinal stenosis, lumbar region with neurogenic claudication: Secondary | ICD-10-CM | POA: Diagnosis not present

## 2024-05-07 DIAGNOSIS — M5416 Radiculopathy, lumbar region: Secondary | ICD-10-CM | POA: Diagnosis not present

## 2024-05-10 ENCOUNTER — Encounter: Payer: Self-pay | Admitting: Internal Medicine

## 2024-05-10 ENCOUNTER — Other Ambulatory Visit: Payer: Self-pay | Admitting: Internal Medicine

## 2024-05-11 ENCOUNTER — Other Ambulatory Visit: Payer: Self-pay | Admitting: Internal Medicine

## 2024-05-11 DIAGNOSIS — I739 Peripheral vascular disease, unspecified: Secondary | ICD-10-CM

## 2024-05-15 ENCOUNTER — Other Ambulatory Visit: Payer: Self-pay | Admitting: Internal Medicine

## 2024-05-15 DIAGNOSIS — K581 Irritable bowel syndrome with constipation: Secondary | ICD-10-CM | POA: Diagnosis not present

## 2024-05-15 DIAGNOSIS — R1011 Right upper quadrant pain: Secondary | ICD-10-CM | POA: Diagnosis not present

## 2024-05-15 DIAGNOSIS — K219 Gastro-esophageal reflux disease without esophagitis: Secondary | ICD-10-CM | POA: Diagnosis not present

## 2024-05-15 DIAGNOSIS — R1314 Dysphagia, pharyngoesophageal phase: Secondary | ICD-10-CM | POA: Diagnosis not present

## 2024-05-15 DIAGNOSIS — E1142 Type 2 diabetes mellitus with diabetic polyneuropathy: Secondary | ICD-10-CM

## 2024-05-15 DIAGNOSIS — E1159 Type 2 diabetes mellitus with other circulatory complications: Secondary | ICD-10-CM | POA: Diagnosis not present

## 2024-05-17 ENCOUNTER — Encounter: Payer: Self-pay | Admitting: Internal Medicine

## 2024-05-17 ENCOUNTER — Other Ambulatory Visit: Payer: Self-pay | Admitting: Gastroenterology

## 2024-05-17 DIAGNOSIS — K219 Gastro-esophageal reflux disease without esophagitis: Secondary | ICD-10-CM

## 2024-05-17 DIAGNOSIS — R1011 Right upper quadrant pain: Secondary | ICD-10-CM

## 2024-05-17 NOTE — Telephone Encounter (Signed)
 Too soon for refill, refilled 04/26/24.  Requested Prescriptions  Pending Prescriptions Disp Refills   gabapentin  (NEURONTIN ) 300 MG capsule [Pharmacy Med Name: GABAPENTIN  CAP 300MG  (NEUR)] 90 capsule 3    Sig: TAKE 1 CAPSULE BY MOUTH DAILY     Neurology: Anticonvulsants - gabapentin  Failed - 05/17/2024 11:39 AM      Failed - Cr in normal range and within 360 days    Creatinine  Date Value Ref Range Status  03/02/2024 1.84 (H) 0.44 - 1.00 mg/dL Final   Creat  Date Value Ref Range Status  06/07/2023 1.98 (H) 0.50 - 1.05 mg/dL Final   Creatinine, Urine  Date Value Ref Range Status  10/28/2022 159.2  Final         Passed - Completed PHQ-2 or PHQ-9 in the last 360 days      Passed - Valid encounter within last 12 months    Recent Outpatient Visits           1 month ago Hives   [facility] Bernardo Fend, DO   2 months ago Hives   [facility] Bernardo Fend, DO   3 months ago Hives   [facility] Gareth Mliss FALCON, FNP       Future Appointments             In 1 week Bernardo Fend, DO [facility], [facility]

## 2024-05-18 ENCOUNTER — Other Ambulatory Visit: Payer: Self-pay | Admitting: Gastroenterology

## 2024-05-18 DIAGNOSIS — R1314 Dysphagia, pharyngoesophageal phase: Secondary | ICD-10-CM

## 2024-05-21 ENCOUNTER — Ambulatory Visit
Admission: RE | Admit: 2024-05-21 | Discharge: 2024-05-21 | Discharge status: Home / Self Care | Source: Ambulatory Visit | Attending: Gastroenterology | Admitting: Gastroenterology

## 2024-05-21 ENCOUNTER — Ambulatory Visit
Admission: RE | Admit: 2024-05-21 | Discharge: 2024-05-21 | Disposition: A | Discharge status: Home / Self Care | Source: Ambulatory Visit | Attending: Gastroenterology | Admitting: Gastroenterology

## 2024-05-21 DIAGNOSIS — R1314 Dysphagia, pharyngoesophageal phase: Secondary | ICD-10-CM | POA: Diagnosis not present

## 2024-05-21 DIAGNOSIS — Z9049 Acquired absence of other specified parts of digestive tract: Secondary | ICD-10-CM | POA: Diagnosis not present

## 2024-05-21 DIAGNOSIS — K219 Gastro-esophageal reflux disease without esophagitis: Secondary | ICD-10-CM | POA: Insufficient documentation

## 2024-05-21 DIAGNOSIS — R1011 Right upper quadrant pain: Secondary | ICD-10-CM | POA: Diagnosis not present

## 2024-05-21 DIAGNOSIS — K449 Diaphragmatic hernia without obstruction or gangrene: Secondary | ICD-10-CM | POA: Diagnosis not present

## 2024-05-21 DIAGNOSIS — R1310 Dysphagia, unspecified: Secondary | ICD-10-CM | POA: Diagnosis not present

## 2024-05-27 DIAGNOSIS — R109 Unspecified abdominal pain: Secondary | ICD-10-CM | POA: Diagnosis not present

## 2024-05-27 DIAGNOSIS — R140 Abdominal distension (gaseous): Secondary | ICD-10-CM | POA: Diagnosis not present

## 2024-05-27 DIAGNOSIS — K581 Irritable bowel syndrome with constipation: Secondary | ICD-10-CM | POA: Diagnosis not present

## 2024-05-29 ENCOUNTER — Other Ambulatory Visit: Payer: Self-pay

## 2024-05-29 ENCOUNTER — Ambulatory Visit: Payer: Self-pay | Admitting: Internal Medicine

## 2024-05-29 ENCOUNTER — Encounter: Payer: Self-pay | Admitting: Internal Medicine

## 2024-05-29 VITALS — BP 132/80 | HR 80 | Temp 98.3°F | Resp 16 | Ht 67.0 in | Wt 239.9 lb

## 2024-05-29 DIAGNOSIS — K219 Gastro-esophageal reflux disease without esophagitis: Secondary | ICD-10-CM | POA: Diagnosis not present

## 2024-05-29 DIAGNOSIS — I10 Essential (primary) hypertension: Secondary | ICD-10-CM

## 2024-05-29 DIAGNOSIS — N1832 Chronic kidney disease, stage 3b: Secondary | ICD-10-CM

## 2024-05-29 DIAGNOSIS — Z85850 Personal history of malignant neoplasm of thyroid: Secondary | ICD-10-CM | POA: Diagnosis not present

## 2024-05-29 DIAGNOSIS — E1142 Type 2 diabetes mellitus with diabetic polyneuropathy: Secondary | ICD-10-CM | POA: Diagnosis not present

## 2024-05-29 DIAGNOSIS — I480 Paroxysmal atrial fibrillation: Secondary | ICD-10-CM | POA: Diagnosis not present

## 2024-05-29 DIAGNOSIS — K581 Irritable bowel syndrome with constipation: Secondary | ICD-10-CM | POA: Diagnosis not present

## 2024-05-29 DIAGNOSIS — I2510 Atherosclerotic heart disease of native coronary artery without angina pectoris: Secondary | ICD-10-CM | POA: Diagnosis not present

## 2024-05-29 MED ORDER — ATORVASTATIN CALCIUM 40 MG PO TABS: 40.0000 mg | ORAL_TABLET | Freq: Every day | ORAL | 1 refills | Status: DC

## 2024-05-29 NOTE — Progress Notes (Signed)
 Established Patient Office Visit  Subjective   Patient ID: Hailey Howard, female    DOB: 24-Mar-1956  Age: 68 y.o. MRN: [id number]  Chief Complaint  Patient presents with   Medical Management of Chronic Issues    6 month recheck    HPI  Patient is here for follow up on chronic medical conditions.   Discussed the use of AI scribe software for clinical note transcription with the patient, who gave verbal consent to proceed.  History of Present Illness Hailey Howard is a 68 year old female who presents for a routine follow-up visit.  Her diabetes management includes metformin  1000 mg twice daily, Ozempic 0.5 mg, and Lantus  reduced to 28 units at night. Mealtime insulin  is 12 units for breakfast, 16 units for lunch, and 8 units for dinner. Her A1c is 7.4. Blood pressure is managed with metoprolol 25 mg at bedtime, torasemide 10 mg, amlodipine , telmisartan, and spironolactone , with a recent reading of 132/80 mmHg. She takes atorvastatin  for hyperlipidemia, which is due for a refill. IBS is managed with Linzess and a reduced dose of Bentyl . She is on levothyroxine  137 mcg for thyroid  management. Gabapentin  was last refilled in June. She uses [facility] for medications and sends local prescriptions to [place]. She has received both doses of the shingles vaccine.   Hypertension/Atrial Fibrillation: -Medications: Amlodipine  5 mg, Telmisartan 80 mg, Spironolactone  50 mg, Metoprolol 25 mg, Torsemide 10 mg -Patient is compliant with above medications and reports no side effects. -Denies any SOB, CP, vision changes, LE edema or symptoms of hypotension -Follows with Cardiology at [facility] -Echo 04/13/22 normal   HLD/CAD/Hx of CABG: -Medications: Lipitor 40 mg, aspirin  -Patient is compliant with above medications and reports no side effects.  -Last lipid panel: 10/24 TC 152, LDL 87, HDL 31.2, triglycerides 170 -Hx of CABG in 2002, PCI stent 2005 - she developed post-operative A.Fib  after CABG in 2002  Diabetes, Type 2: -Last A1c 7.4% 4/25 -Medications: Metformin  1000 mg BID, Ozempic 0.5 mg, Lantus  decreased 28 units at night and Fiasp  12 units at breakfast, 16 units at lunch and 8 units at dinner and Gabapentin  300 mg once daily  -Follows with Endocrinology -Statin: yes -PNA vaccine: UTD -Denies symptoms of polyuria, polydipsia, numbness extremities, foot ulcers/trauma.   Hx of Papillary Carcinoma of the Thyroid : -Currently on Levothyroxine  137 mcg -TSH 4/25 0.769 -Partial thyroidectomy in 2009 due to nodules, then total thyroidectomy in 2021 after cancer diagnosis   CKD3b/AoCD: -Last creatinine 4/25 creatinine 1.6, GFR 34 -Follows with Nephrology  -Lillis Needle but caused frequent yeast infections so this was discontinued  -Also follows with Heme/Onc -Does take iron  PO, had IV iron  which did not go well  -Currently takes Vitamin D  1000 IU  GERD: -Currently on Prilosec 40 mg, symptoms well controlled  IBS: -Currently on Linzess 72 mcg taking every other day  Health Maintenance: -Blood work UTD -Mammogram 2/25, Birads-1, already scheduled 2/10 -Colonoscopy 2/23, repeat in 5 years   Patient Active Problem List   Diagnosis Date Noted   History of thyroid  cancer 11/30/2023   Lumbar radiculopathy 07/26/2023   Spinal stenosis, lumbar region, with neurogenic claudication 07/26/2023   Chronic painful diabetic neuropathy (HCC) 07/26/2023   Chronic pain syndrome 07/26/2023   History of iron  deficiency anemia 02/18/2023   S/P hip replacement 02/24/2021   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   Laryngospasms 10/21/2020   Hoarseness 10/21/2020   Dysphagia 10/21/2020   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/05/2020   S/P total  thyroidectomy 03/14/2020   Multinodular goiter 03/04/2020   Chronic abdominal pain 11/29/2019   Anemia of chronic kidney failure, stage 3 (moderate) (HCC) 08/17/2019   Abdominal pain, recurrent 07/01/2019   Irritable bowel syndrome with  constipation 07/01/2019   IDA (iron  deficiency anemia) 06/29/2019   Type 2 diabetes mellitus with both eyes affected by mild nonproliferative retinopathy without macular edema, with long-term current use of insulin  (HCC) 09/12/2018   Type 2 diabetes mellitus with vascular disease (HCC) 09/12/2018   Uncontrolled type 2 diabetes mellitus with hyperglycemia (HCC) 09/12/2018   Morbid obesity (HCC) 09/12/2018   Elevation of muscle enzyme 08/30/2018   Elevated erythrocyte sedimentation rate 08/30/2018   Myalgia 08/15/2018   Chronic pain of both shoulders 08/15/2018   Sacroiliitis (HCC) 02/15/2018   Schatzki's ring 06/21/2017   Chronic superficial gastritis without bleeding 06/21/2017   Gastroesophageal reflux disease without esophagitis 04/20/2017   Coronary artery disease 08/02/2016   Hyperlipidemia 08/02/2016   Hypertension 08/02/2016   Tachycardia 08/02/2016   Mesenteric mass 08/02/2016   Bursitis of left shoulder 05/07/2016   Rotator cuff tendinitis, left 05/07/2016   Impingement syndrome of left shoulder 05/07/2016   Abnormal ECG 12/24/2015   DM type 2 with diabetic peripheral neuropathy (HCC) 09/19/2015   Insulin -treated type 2 diabetes mellitus (HCC) 09/19/2015   Uncontrolled type 2 diabetes mellitus with hyperglycemia, with long-term current use of insulin  (HCC) 09/19/2015   Chronic kidney disease, stage III (moderate) (HCC) 09/10/2015   Hypokalemia 09/10/2015   Bundle branch block 09/08/2015   Edema 06/16/2015   Atrial fibrillation (HCC) 05/01/2014   A-fib (HCC) 05/01/2014   Hypothyroidism 05/01/2014   Past Medical History:  Diagnosis Date   Anemia of chronic kidney failure, stage 3 (moderate) (HCC) 08/17/2019   Anemia of chronic renal failure    Aortic atherosclerosis (HCC)    Arthritis    Bradycardia    Cataract    CKD (chronic kidney disease), stage III (HCC)    Colon polyps    Coronary artery disease 05/03/2001   a.) LHC 05/03/2001: 95% pLCx, 95% mLAD-1, 75%  mLAD-2, 95% D2 --> LAD dissection occurred during procedure --> CVTS consult; b.) s/p 3v CABG 05/03/2001: LIMA-LAD,SVG-OM2, SVG-D2; c.) LHC/PCI 02/12/2004: 25% pRCA, 25% mRCA, 95% RPDA, 25% LM, 25% pLCx, 95% mLCx (3.0 x 13 mm Cypher), 50% OM3, 25% pLAD, 100% mLAD, 25% D2, 25% LIMA-LAD, 100% SVG-OM2, 50% SVG-D2   Coronary artery dissection 05/03/2001   a.) LAD dissection occurred during diagnostic PCI at [facility]; IABP placed and patient sent for emergency CABG   Diabetic neuropathy (HCC)    Dysphagia    Exertional dyspnea    Gastritis    GERD (gastroesophageal reflux disease)    Headache    History of hiatal hernia    History of shingles    HLD (hyperlipidemia)    Hypertension    IBS (irritable bowel syndrome)    IDA (iron  deficiency anemia)    a.) followed by hematology; receiving iron  infusions PRN   Long-term use of aspirin  therapy    Mesenteric mass    Muscle tension dysphonia    PAF (paroxysmal atrial fibrillation) (HCC)    a.) CHA2DS2VASc = 5 (age, sex, HTN, vascular disease, T2DM) as of 02/06/2024; b.) rate/rhythm maintained on oral metoprolol succinate; no chronic OAC   Peripheral vascular disease (HCC)    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   a.) s/p total thyroidectomy for thyroid  cancer   Right BBB/left ant fasc block    Rotator cuff tendinitis, left  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   a.) SVG-OM2, SVG-D2, LIMA-LAD   SVT (supraventricular tachycardia) (HCC)    T2DM (type 2 diabetes mellitus) (HCC)    Thyroid  cancer (HCC)    a.) s/p total thyroidectomy 03/14/2020   Trochanteric bursitis of left hip    Vitamin D  deficiency    Past Surgical History:  Procedure Laterality Date   ABDOMINAL HYSTERECTOMY     CHOLECYSTECTOMY     COLONOSCOPY WITH PROPOFOL  N/A 12/20/2016   Procedure: COLONOSCOPY WITH PROPOFOL ;  Surgeon: Lamar ONEIDA Holmes, MD;  Location: [facility] ENDOSCOPY;  Service: Endoscopy;  Laterality: N/A;   COLONOSCOPY WITH PROPOFOL  N/A 01/19/2022   Procedure: COLONOSCOPY WITH  PROPOFOL ;  Surgeon: Maryruth Ole ONEIDA, MD;  Location: [facility] ENDOSCOPY;  Service: Endoscopy;  Laterality: N/A;   CORONARY ANGIOPLASTY WITH STENT PLACEMENT Left 02/12/2004   Procedure: CORONARY ANGIOPLASTY WITH STENT PLACEMENT; Location: [facility]; Surgeon: Miquel Ellen, MD   CORONARY ARTERY BYPASS GRAFT N/A 05/03/2001   Procedure: CORONARY ARTERY BYPASS GRAFT; Location: [facility]; Surgeon: Bernardino Lares, MD   ESOPHAGOGASTRODUODENOSCOPY N/A 03/15/2023   Procedure: ESOPHAGOGASTRODUODENOSCOPY (EGD);  Surgeon: Maryruth Ole ONEIDA, MD;  Location: [facility] ENDOSCOPY;  Service: Endoscopy;  Laterality: N/A;   ESOPHAGOGASTRODUODENOSCOPY (EGD) WITH PROPOFOL  N/A 04/27/2017   Procedure: ESOPHAGOGASTRODUODENOSCOPY (EGD) WITH PROPOFOL ;  Surgeon: Holmes Lamar ONEIDA, MD;  Location[facility] ENDOSCOPY;  Service: Endoscopy;  Laterality: N/A;   EUS N/A 04/24/2015   Procedure: ESOPHAGEAL ENDOSCOPIC ULTRASOUND (EUS) RADIAL;  Surgeon: Ozell Eva Montes, MD;  Location: [facility] ENDOSCOPY;  Service: Endoscopy;  Laterality: N/A;   LEFT HEART CATH AND CORONARY ANGIOGRAPHY Left 05/03/2001   Procedure: LEFT HEART CATH AND CORONARY ANGIOGRAPHY; Location: [facility]; Surgeon: Ozell Estelle, MD   TOTAL HIP ARTHROPLASTY Left 02/24/2021   Procedure: TOTAL HIP ARTHROPLASTY ANTERIOR APPROACH;  Surgeon: Kathlynn Ozell, MD;  Location: [facility] ORS;  Service: Orthopedics;  Laterality: Left;   TOTAL THYROIDECTOMY N/A 03/14/2020   Procedure: TOTAL THYROIDECTOMY; Location: [facility]   Social History   Tobacco Use   Smoking status: Never   Smokeless tobacco: Never  Vaping Use   Vaping status: Never Used  Substance Use Topics   Alcohol use: No   Drug use: No   Social History   Socioeconomic History   Marital status: Married    Spouse name: Virgil   Number of children: Not on file   Years of education: Not on file   Highest education level: Associate degree: academic program  Occupational History   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    Social Drivers of Corporate investment banker Strain: Low Risk  (05/26/2024)   Overall Financial Resource Strain (CARDIA)    Difficulty of Paying Living Expenses: Not hard at all  Food Insecurity: No Food Insecurity (05/26/2024)   Hunger Vital Sign    Worried About Running Out of Food in the Last Year: Never true    Ran Out of Food in the Last Year: Never true  Transportation Needs: No Transportation Needs (05/26/2024)   PRAPARE - Administrator, Civil Service (Medical): No  Lack of Transportation (Non-Medical): No  Physical Activity: Insufficiently Active (05/26/2024)   Exercise Vital Sign    Days of Exercise per Week: 2 days    Minutes of Exercise per Session: 20 min  Stress: No Stress Concern Present (05/26/2024)   [employer] of Occupational Health - Occupational Stress Questionnaire    Feeling of Stress: Not at all  Social Connections: Unknown (05/26/2024)   Social Connection and Isolation Panel    Frequency of Communication with Friends and Family: Twice a week    Frequency of Social Gatherings with Friends and Family: Patient declined    Attends Religious Services: More than 4 times per year    Active Member of [employer] or Organizations: Yes    Attends Engineer, structural: More than 4 times per year    Marital Status: Married  Catering manager Violence: Not At Risk (09/08/2023)   Humiliation, Afraid, Rape, and Kick questionnaire    Fear of Current or Ex-Partner: No    Emotionally Abused: No    Physically Abused: No    Sexually Abused: No   Family Status  Relation Name Status   Sister Ronal Minus (Not  Specified)   Sister Sherrilyn Minus (Not Specified)   Sister Lanell Chancy (Not Specified)   Neg Hx  (Not Specified)  No partnership data on file   Family History  Problem Relation Age of Onset   Arthritis Sister    Heart disease Sister    Arthritis Sister    Arthritis Sister    Breast cancer Neg Hx    Allergies  Allergen Reactions   Iodinated Contrast Media Anaphylaxis, Hives and Itching   Oxycodone -Acetaminophen  Hives   Etodolac Itching      Review of Systems  All other systems reviewed and are negative.     Objective:     BP 132/80 (Cuff Size: Large)   Pulse 80   Temp 98.3 F (36.8 C) (Oral)   Resp 16   Ht 5' 7 (1.702 m)   Wt 239 lb 14.4 oz (108.8 kg)   SpO2 98%   BMI 37.57 kg/m  BP Readings from Last 3 Encounters:  05/29/24 132/80  03/20/24 124/72  03/12/24 (!) 142/80   Wt Readings from Last 3 Encounters:  05/29/24 239 lb 14.4 oz (108.8 kg)  03/20/24 244 lb 6.4 oz (110.9 kg)  03/12/24 245 lb 8 oz (111.4 kg)      Physical Exam Constitutional:      Appearance: Normal appearance.  HENT:     Head: Normocephalic and atraumatic.  Eyes:     Conjunctiva/sclera: Conjunctivae normal.  Cardiovascular:     Rate and Rhythm: Normal rate and regular rhythm.  Pulmonary:     Effort: Pulmonary effort is normal.     Breath sounds: Normal breath sounds.  Musculoskeletal:     Right lower leg: Edema present.     Left lower leg: Edema present.     Comments: Mild BLE non-pitting edema present  Skin:    General: Skin is warm and dry.  Neurological:     General: No focal deficit present.     Mental Status: She is alert. Mental status is at baseline.  Psychiatric:        Mood and Affect: Mood normal.        Behavior: Behavior normal.      No results found for any visits on 05/29/24.   Last CBC Lab Results  Component Value Date   WBC 6.8 03/02/2024   HGB  10.7 (L) 03/02/2024   HCT 34.9 (L) 03/02/2024   MCV 89.5 03/02/2024   MCH 27.4 03/02/2024   RDW  14.1 03/02/2024   PLT 290 03/02/2024   Last metabolic panel Lab Results  Component Value Date   GLUCOSE 86 03/02/2024   NA 141 03/02/2024   K 3.9 03/02/2024   CL 105 03/02/2024   CO2 24 03/02/2024   BUN 30 (H) 03/02/2024   CREATININE 1.84 (H) 03/02/2024   GFRNONAA 29 (L) 03/02/2024   CALCIUM  8.7 (L) 03/02/2024   PROT 7.4 03/02/2024   ALBUMIN 4.0 03/02/2024   LABGLOB 3.0 03/02/2024   AGRATIO 1.3 03/02/2024   BILITOT 0.6 03/02/2024   ALKPHOS 80 03/02/2024   AST 29 03/02/2024   ALT 26 03/02/2024   ANIONGAP 12 03/02/2024   Last lipids No results found for: CHOL, HDL, LDLCALC, LDLDIRECT, TRIG, CHOLHDL Last hemoglobin A1c Lab Results  Component Value Date   HGBA1C 6.7 08/29/2023   Last thyroid  functions Lab Results  Component Value Date   TSH 0.11 (L) 10/27/2023   Last vitamin D  Lab Results  Component Value Date   VD25OH 47 10/27/2023   Last vitamin B12 and Folate Lab Results  Component Value Date   VITAMINB12 427 10/27/2023   FOLATE 18.7 10/27/2023      The ASCVD Risk score (Arnett DK, et al., 2019) failed to calculate for the following reasons:   Cannot find a previous HDL lab   Cannot find a previous total cholesterol lab    Assessment & Plan:   Assessment & Plan Type 2 Diabetes Mellitus w/Neuropathy Hemoglobin A1c at 7.4%. Following with Endocrinology. Current treatment includes metformin , Ozempic, and insulin . - Continue metformin  1000 mg twice daily. - Continue Ozempic 0.5 mg as prescribed. - Continue insulin  regimen with Lantus  28 units at night and mealtime insulin  as prescribed.  Hypertension/A.Fib Blood pressure well-controlled at 132/80 mmHg. - Continue current antihypertensive medications as prescribed by the cardiologist.  Hyperlipidemia Cholesterol levels well-controlled on atorvastatin . - Refill atorvastatin  prescription.  Chronic Kidney Disease Kidney function shows slight worsening but remains stable.  Irritable Bowel  Syndrome (IBS) Managed with Linzess and reduced Bentyl  dosage due to adverse effects. - Continue Linzess as prescribed. - Continue Bentyl  at reduced dosage as advised by the gastroenterologist.  Hypothyroidism/Hx of Thyroid  Cancer Thyroid  function tests stable on levothyroxine  137 mcg. - Continue levothyroxine  137 mcg as prescribed.  GERD Symptoms well controlled.  - Continue PPI.  General Health Maintenance Vaccinations up to date, including shingles vaccine. Mammogram completed this year. - Document second shingles vaccine received in 2021.  Follow-up Routine follow-up planned. - Schedule follow-up appointment in six months unless needed sooner.   Return in about 6 months (around 11/29/2024).    Sharyle Fischer, DO

## 2024-06-20 ENCOUNTER — Encounter: Payer: Self-pay | Admitting: Internal Medicine

## 2024-06-25 ENCOUNTER — Other Ambulatory Visit (INDEPENDENT_AMBULATORY_CARE_PROVIDER_SITE_OTHER): Payer: Self-pay | Admitting: Nurse Practitioner

## 2024-06-25 DIAGNOSIS — I739 Peripheral vascular disease, unspecified: Secondary | ICD-10-CM

## 2024-06-27 ENCOUNTER — Other Ambulatory Visit: Payer: Self-pay | Admitting: Internal Medicine

## 2024-06-27 ENCOUNTER — Other Ambulatory Visit (INDEPENDENT_AMBULATORY_CARE_PROVIDER_SITE_OTHER)

## 2024-06-27 ENCOUNTER — Encounter (INDEPENDENT_AMBULATORY_CARE_PROVIDER_SITE_OTHER): Payer: Self-pay | Admitting: Nurse Practitioner

## 2024-06-27 ENCOUNTER — Ambulatory Visit (INDEPENDENT_AMBULATORY_CARE_PROVIDER_SITE_OTHER): Admitting: Nurse Practitioner

## 2024-06-27 VITALS — BP 151/85 | HR 68 | Resp 18 | Ht 67.0 in | Wt 246.2 lb

## 2024-06-27 DIAGNOSIS — I739 Peripheral vascular disease, unspecified: Secondary | ICD-10-CM | POA: Diagnosis not present

## 2024-06-27 DIAGNOSIS — M79605 Pain in left leg: Secondary | ICD-10-CM

## 2024-06-27 DIAGNOSIS — E1159 Type 2 diabetes mellitus with other circulatory complications: Secondary | ICD-10-CM | POA: Diagnosis not present

## 2024-06-27 DIAGNOSIS — I10 Essential (primary) hypertension: Secondary | ICD-10-CM

## 2024-06-27 DIAGNOSIS — E1142 Type 2 diabetes mellitus with diabetic polyneuropathy: Secondary | ICD-10-CM

## 2024-06-28 NOTE — Telephone Encounter (Signed)
 Requested Prescriptions  Pending Prescriptions Disp Refills   gabapentin  (NEURONTIN ) 300 MG capsule [Pharmacy Med Name: GABAPENTIN  CAP 300MG  (NEUR)] 90 capsule 1    Sig: TAKE 1 CAPSULE BY MOUTH DAILY     Neurology: Anticonvulsants - gabapentin  Failed - 06/28/2024  4:27 PM      Failed - Cr in normal range and within 360 days    Creatinine  Date Value Ref Range Status  03/02/2024 1.84 (H) 0.44 - 1.00 mg/dL Final   Creat  Date Value Ref Range Status  06/07/2023 1.98 (H) 0.50 - 1.05 mg/dL Final   Creatinine, Urine  Date Value Ref Range Status  10/28/2022 159.2  Final         Passed - Completed PHQ-2 or PHQ-9 in the last 360 days      Passed - Valid encounter within last 12 months    Recent Outpatient Visits           1 month ago Hypertension, unspecified type   [facility] Bernardo Fend, DO   3 months ago Hives   [facility] Bernardo Fend, DO   3 months ago Hives   [facility] Bernardo Fend, DO   4 months ago Hives   [facility] Gareth Mliss FALCON, FNP       Future Appointments             In 5 months Bernardo Fend, DO [facility], [facility]

## 2024-06-29 LAB — VAS US ABI WITH/WO TBI
Left ABI: 1.08
Right ABI: 1.19

## 2024-07-01 ENCOUNTER — Encounter (INDEPENDENT_AMBULATORY_CARE_PROVIDER_SITE_OTHER): Payer: Self-pay | Admitting: Nurse Practitioner

## 2024-07-01 NOTE — Progress Notes (Signed)
 Subjective:    Patient ID: Hailey Howard, female    DOB: [birth year], 68 y.o.   MRN: [id number] Chief Complaint  Patient presents with   New Patient (Initial Visit)    Ref Bernardo consult PVD    The patient presents today for evaluation of pain in her left lower extremity.  She also the pain happened after having her hip replaced.  She is concerned that they nicked a vein and that may be what is causing her pain.  The pain has been ongoing since she has had her surgery.  It appears to be constant.  It is not quite consistent with claudication symptoms.  There is more so discomfort over the area.  Today the patient has an ABI of 1.19 on the right and 1.11 on the left.  She has strong triphasic tibial artery waveforms bilaterally normal toe waveforms bilaterally.    Review of Systems  Musculoskeletal:  Positive for myalgias.  All other systems reviewed and are negative.      Objective:   Physical Exam Vitals reviewed.  HENT:     Head: Normocephalic.  Cardiovascular:     Rate and Rhythm: Normal rate.     Pulses: Normal pulses.  Pulmonary:     Effort: Pulmonary effort is normal.  Skin:    General: Skin is warm and dry.  Neurological:     Mental Status: She is alert and oriented to person, place, and time.  Psychiatric:        Mood and Affect: Mood normal.        Behavior: Behavior normal.        Thought Content: Thought content normal.        Judgment: Judgment normal.     BP (!) 151/85   Pulse 68   Resp 18   Ht 5' 7 (1.702 m)   Wt 246 lb 3.2 oz (111.7 kg)   BMI 38.56 kg/m   Past Medical History:  Diagnosis Date   Anemia of chronic kidney failure, stage 3 (moderate) (HCC) 08/17/2019   Anemia of chronic renal failure    Aortic atherosclerosis (HCC)    Arthritis    Bradycardia    Cataract    CKD (chronic kidney disease), stage III (HCC)    Colon polyps    Coronary artery disease 05/03/2001   a.) LHC 05/03/2001: 95% pLCx, 95% mLAD-1, 75% mLAD-2, 95% D2 -->  LAD dissection occurred during procedure --> CVTS consult; b.) s/p 3v CABG 05/03/2001: LIMA-LAD,SVG-OM2, SVG-D2; c.) LHC/PCI 02/12/2004: 25% pRCA, 25% mRCA, 95% RPDA, 25% LM, 25% pLCx, 95% mLCx (3.0 x 13 mm Cypher), 50% OM3, 25% pLAD, 100% mLAD, 25% D2, 25% LIMA-LAD, 100% SVG-OM2, 50% SVG-D2   Coronary artery dissection 05/03/2001   a.) LAD dissection occurred during diagnostic PCI at [facility]; IABP placed and patient sent for emergency CABG   Diabetic neuropathy (HCC)    Dysphagia    Exertional dyspnea    Gastritis    GERD (gastroesophageal reflux disease)    Headache    History of hiatal hernia    History of shingles    HLD (hyperlipidemia)    Hypertension    IBS (irritable bowel syndrome)    IDA (iron  deficiency anemia)    a.) followed by hematology; receiving iron  infusions PRN   Long-term use of aspirin  therapy    Mesenteric mass    Muscle tension dysphonia    PAF (paroxysmal atrial fibrillation) (HCC)    a.) CHA2DS2VASc = 5 (age, sex, HTN,  vascular disease, T2DM) as of 02/06/2024; b.) rate/rhythm maintained on oral metoprolol succinate; no chronic OAC   Peripheral vascular disease (HCC)    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   a.) s/p total thyroidectomy for thyroid  cancer   Right BBB/left ant fasc block    Rotator cuff tendinitis, left    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   a.) SVG-OM2, SVG-D2, LIMA-LAD   SVT (supraventricular tachycardia) (HCC)    T2DM (type 2 diabetes mellitus) (HCC)    Thyroid  cancer (HCC)    a.) s/p total thyroidectomy 03/14/2020   Trochanteric bursitis of left hip    Vitamin D  deficiency     Social History   Socioeconomic History   Marital status: Married    Spouse name: Virgil   Number of children: Not on file   Years of education: Not on file   Highest education level: Associate degree: academic program  Occupational History   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    Social Drivers of Corporate investment banker Strain: Low Risk  (05/26/2024)   Overall Financial Resource Strain (CARDIA)    Difficulty of Paying Living Expenses: Not hard at all  Food Insecurity: No Food Insecurity (05/26/2024)   Hunger Vital Sign    Worried About Running Out of Food in the Last Year: Never true    Ran Out of Food in the Last Year: Never true  Transportation Needs: No Transportation Needs (05/26/2024)   PRAPARE - Administrator, Civil Service (Medical): No    Lack of Transportation (Non-Medical): No  Physical Activity: Insufficiently Active (05/26/2024)   Exercise Vital Sign    Days of Exercise per Week: 2 days    Minutes of Exercise per Session: 20 min  Stress: No Stress Concern Present (05/26/2024)   [employer] of Occupational Health - Occupational Stress Questionnaire    Feeling of Stress: Not at all  Social Connections: Unknown (05/26/2024)   Social Connection and Isolation Panel    Frequency of Communication with Friends and Family: Twice a week    Frequency of Social Gatherings with Friends and Family: Patient declined    Attends Religious Services: More than 4 times per year    Active Member of Clubs or Organizations: Yes    Attends Banker Meetings: More than 4 times per year    Marital Status: Married  Catering manager Violence: Not At Risk (09/08/2023)   Humiliation, Afraid, Rape, and Kick questionnaire    Fear of Current or Ex-Partner: No    Emotionally Abused: No    Physically Abused: No    Sexually Abused: No    Past Surgical History:  Procedure Laterality Date   ABDOMINAL HYSTERECTOMY     CHOLECYSTECTOMY     COLONOSCOPY WITH PROPOFOL   N/A 12/20/2016   Procedure: COLONOSCOPY WITH PROPOFOL ;  Surgeon: Lamar ONEIDA Holmes, MD;  Location: [facility] ENDOSCOPY;  Service: Endoscopy;  Laterality: N/A;   COLONOSCOPY WITH PROPOFOL  N/A 01/19/2022   Procedure: COLONOSCOPY WITH PROPOFOL ;  Surgeon: Maryruth Ole ONEIDA, MD;  Location: [facility] ENDOSCOPY;  Service: Endoscopy;  Laterality: N/A;   CORONARY ANGIOPLASTY WITH STENT PLACEMENT Left 02/12/2004   Procedure: CORONARY ANGIOPLASTY WITH STENT PLACEMENT; Location: [facility]; Surgeon: Miquel Ellen, MD   CORONARY ARTERY BYPASS GRAFT N/A 05/03/2001   Procedure: CORONARY ARTERY BYPASS GRAFT; Location: [facility]; Surgeon: Bernardino Lares, MD   ESOPHAGOGASTRODUODENOSCOPY N/A 03/15/2023   Procedure: ESOPHAGOGASTRODUODENOSCOPY (EGD);  Surgeon: Maryruth Ole DASEN, MD;  Location: [facility] ENDOSCOPY;  Service: Endoscopy;  Laterality: N/A;   ESOPHAGOGASTRODUODENOSCOPY (EGD) WITH PROPOFOL  N/A 04/27/2017   Procedure: ESOPHAGOGASTRODUODENOSCOPY (EGD) WITH PROPOFOL ;  Surgeon: Viktoria Lamar DASEN, MD;  Location: [facility] ENDOSCOPY;  Service: Endoscopy;  Laterality: N/A;   EUS N/A 04/24/2015   Procedure: ESOPHAGEAL ENDOSCOPIC ULTRASOUND (EUS) RADIAL;  Surgeon: Ozell Eva Montes, MD;  Location: [facility] ENDOSCOPY;  Service: Endoscopy;  Laterality: N/A;   LEFT HEART CATH AND CORONARY ANGIOGRAPHY Left 05/03/2001   Procedure: LEFT HEART CATH AND CORONARY ANGIOGRAPHY; Location: [facility]; Surgeon: Ozell Estelle, MD   TOTAL HIP ARTHROPLASTY Left 02/24/2021   Procedure: TOTAL HIP ARTHROPLASTY ANTERIOR APPROACH;  Surgeon: Kathlynn Ozell, MD;  Location: [facility] ORS;  Service: Orthopedics;  Laterality: Left;   TOTAL THYROIDECTOMY N/A 03/14/2020   Procedure: TOTAL THYROIDECTOMY; Location: [facility]    Family History  Problem Relation Age of Onset   Arthritis Sister    Heart disease Sister    Arthritis Sister    Arthritis Sister    Breast cancer Neg Hx     Allergies  Allergen Reactions   Iodinated Contrast Media Anaphylaxis, Hives and Itching    Oxycodone -Acetaminophen  Hives   Etodolac Itching       Latest Ref Rng & Units 03/02/2024    1:04 PM 09/02/2023    1:06 PM 02/18/2023    1:16 PM  CBC  WBC 4.0 - 10.5 K/uL 6.8  8.2  8.1   Hemoglobin 12.0 - 15.0 g/dL 89.2  89.5  88.0   Hematocrit 36.0 - 46.0 % 34.9  33.5  37.3   Platelets 150 - 400 K/uL 290  313  277       CMP     Component Value Date/Time   NA 141 03/02/2024 1304   NA 136 03/10/2015 1640   K 3.9 03/02/2024 1304   K 2.7 (L) 03/10/2015 1640   CL 105 03/02/2024 1304   CL 96 (L) 03/10/2015 1640   CO2 24 03/02/2024 1304   CO2 31 03/10/2015 1640   GLUCOSE 86 03/02/2024 1304   GLUCOSE 99 03/10/2015 1640   BUN 30 (H) 03/02/2024 1304   BUN 16 03/10/2015 1640   CREATININE 1.84 (H) 03/02/2024 1304   CREATININE 1.98 (H) 06/07/2023 1123   CALCIUM  8.7 (L) 03/02/2024 1304   CALCIUM  9.0 03/10/2015 1640   PROT 7.4 03/02/2024 1304   PROT 7.7 03/10/2015 1640   ALBUMIN 4.0 03/02/2024 1304   ALBUMIN 4.1 03/10/2015 1640   AST 29 03/02/2024 1304   ALT 26 03/02/2024 1304   ALT 39 03/10/2015 1640   ALKPHOS 80 03/02/2024 1304   ALKPHOS 60 03/10/2015 1640   BILITOT 0.6 03/02/2024 1304   EGFR 27 (L) 06/07/2023 1123   GFRNONAA 29 (L) 03/02/2024 1304   GFRNONAA 50 (L) 03/10/2015 1640     VAS US  ABI WITH/WO TBI Result Date: 06/29/2024  LOWER EXTREMITY DOPPLER STUDY Patient Name:  Hailey Howard  Date of Exam:   06/27/2024 Medical Rec #: [id number]          Accession #:    [id number] Date of Birth: Sep 29, 1956  Patient Gender: F Patient Age:   31 years Exam Location:  [facility] Procedure:      VAS US  ABI WITH/WO TBI Referring Phys: Cordella Shawl --------------------------------------------------------------------------------  Indications: Peripheral artery disease.  Performing Technologist: Leafy Gibes RVS  Examination Guidelines: A complete evaluation includes at minimum, Doppler waveform signals and systolic blood pressure reading at the level of  bilateral brachial, anterior tibial, and posterior tibial arteries, when vessel segments are accessible. Bilateral testing is considered an integral part of a complete examination. Photoelectric Plethysmograph (PPG) waveforms and toe systolic pressure readings are included as required and additional duplex testing as needed. Limited examinations for reoccurring indications may be performed as noted.  ABI Findings: +---------+------------------+-----+---------+--------+ Right    Rt Pressure (mmHg)IndexWaveform Comment  +---------+------------------+-----+---------+--------+ Brachial 184                                      +---------+------------------+-----+---------+--------+ ATA      191               1.03 triphasic         +---------+------------------+-----+---------+--------+ PTA      221               1.19 triphasic         +---------+------------------+-----+---------+--------+ Great Toe190               1.03 Normal            +---------+------------------+-----+---------+--------+ +---------+------------------+-----+---------+-------+ Left     Lt Pressure (mmHg)IndexWaveform Comment +---------+------------------+-----+---------+-------+ Brachial 185                                     +---------+------------------+-----+---------+-------+ ATA      200               1.08 triphasic        +---------+------------------+-----+---------+-------+ PTA      200               1.08 triphasic        +---------+------------------+-----+---------+-------+ Great Toe204               1.10 Normal           +---------+------------------+-----+---------+-------+ +-------+-----------+-----------+------------+------------+ ABI/TBIToday's ABIToday's TBIPrevious ABIPrevious TBI +-------+-----------+-----------+------------+------------+ Right  1.19       1.03                                +-------+-----------+-----------+------------+------------+ Left    1.08       1.10                                +-------+-----------+-----------+------------+------------+  Summary: Right: Resting right ankle-brachial index is within normal range. The right toe-brachial index is normal. Left: Resting left ankle-brachial index is within normal range. The left toe-brachial index is normal. *See table(s) above for measurements and observations.  Electronically signed by Cordella Shawl MD on 06/29/2024 at 7:32:07 AM.    Final        Assessment & Plan:   1. Left leg pain (Primary) Recommend:  The patient has atypical pain symptoms for vascular disease and on exam I do not find evidence of vascular pathology that would explain the patient's symptoms.  Noninvasive studies do not identify significant vascular problems  I suspect the patient is c/o pseudoclaudication.  Patient should have an evaluation of the LS spine which I defer to the primary service  The patient should continue walking and begin a more formal exercise program.   Patient will follow-up with me on a PRN basis.  2. Primary hypertension Continue antihypertensive medications as already ordered, these medications have been reviewed and there are no changes at this time.  3. Type 2 diabetes mellitus with vascular disease (HCC) Continue hypoglycemic medications as already ordered, these medications have been reviewed and there are no changes at this time.  Hgb A1C to be monitored as already arranged by primary service   Current Outpatient Medications on File Prior to Visit  Medication Sig Dispense Refill   acetaminophen  (TYLENOL ) 500 MG tablet Take 2 tablets (1,000 mg total) by mouth every 8 (eight) hours. 90 tablet 2   amLODipine  (NORVASC ) 5 MG tablet Take 2.5 mg by mouth in the morning.     atorvastatin  (LIPITOR) 40 MG tablet Take 1 tablet (40 mg total) by mouth daily at 12 noon. 90 tablet 1   Cholecalciferol 25 MCG (1000 UT) tablet Take 1 tablet by mouth daily at 12 noon.      dicyclomine  (BENTYL ) 20 MG tablet Take 20 mg by mouth 2 (two) times daily as needed for spasms.      famotidine  (PEPCID ) 20 MG tablet Take 1 tablet (20 mg total) by mouth 2 (two) times daily. 60 tablet 0   fexofenadine  (ALLEGRA  ALLERGY) 180 MG tablet Take 1 tablet (180 mg total) by mouth daily. 90 tablet 1   FIASP  100 UNIT/ML SOLN Inject 8-16 Units into the skin See admin instructions. Inject 12 units subcutaneously in the morning, inject 16 units subcutaneously with lunch & inject 8 units subcutaneously with supper.     fluticasone  (FLONASE ) 50 MCG/ACT nasal spray Place 1 spray into both nostrils daily as needed for allergies.     insulin  glargine (LANTUS  SOLOSTAR) 100 UNIT/ML Solostar Pen Inject 28 Units into the skin at bedtime.     Iron -Vitamin C  (VITRON-C) 65-125 MG TABS Take 1 tablet by mouth every evening.     levothyroxine  (SYNTHROID ) 137 MCG tablet Take 137 mcg by mouth daily before breakfast.     linaclotide (LINZESS) 72 MCG capsule Take 72 mcg by mouth daily as needed (constipation.).     Magnesium  400 MG CAPS Take 400 mg by mouth in the morning.     Menthol , Topical Analgesic, (ICY HOT EX) Apply 1 Application topically as needed (pain.).     Menthol -Methyl Salicylate (SALONPAS PAIN RELIEF PATCH EX) Place 1 patch onto the skin daily as needed (pain.).     metFORMIN  (GLUCOPHAGE ) 500 MG tablet Take 500-1,000 mg by mouth See admin instructions. Take 1 tablet (500 mg) by mouth in the morning & take 2 tablets (1000 mg) by mouth at night.     metoprolol succinate (TOPROL-XL) 25 MG 24 hr tablet Take 25 mg by mouth at bedtime.     Multiple Vitamin (MULTIVITAMIN WITH MINERALS) TABS tablet Take 1 tablet by mouth daily.     omeprazole (PRILOSEC) 40 MG capsule Take 40 mg by mouth in the morning.     Semaglutide (OZEMPIC, 0.25 OR 0.5 MG/DOSE, [place]) Inject 0.5 mg into the skin every Thursday.     spironolactone  (ALDACTONE ) 50 MG tablet Take 50 mg by mouth at bedtime.     telmisartan (MICARDIS) 80 MG  tablet Take  80 mg by mouth at bedtime.     torsemide (DEMADEX) 10 MG tablet Take 10 mg by mouth in the morning.     No current facility-administered medications on file prior to visit.    There are no Patient Instructions on file for this visit. No follow-ups on file.   Fontaine Kossman E Nyles Mitton, NP

## 2024-07-05 ENCOUNTER — Ambulatory Visit: Payer: Self-pay

## 2024-07-05 NOTE — Telephone Encounter (Signed)
 FYI Only or Action Required?: FYI only for provider.  Patient was last seen in primary care on 05/29/2024 by Bernardo Fend, DO.  Called Nurse Triage reporting Toe Pain.  Symptoms began yesterday.  Interventions attempted: Nothing.  Symptoms are: unchanged.  Triage Disposition: See PCP When Office is Open (Within 3 Days)  Patient/caregiver understands and will follow disposition?: Yes     Copied from CRM #[id number]. Topic: Clinical - Red Word Triage >> Jul 05, 2024  2:12 PM Tiffini S wrote: [facility] that prompted transfer to Nurse Triage: Patient was scheduled a appointment for 8/15- she has pain and swollen big toe on right foot. Reason for Disposition  Pain in the big toe joint  Answer Assessment - Initial Assessment Questions 1. ONSET: When did the pain start?      yesterday 2. LOCATION: Where is the pain located?   (e.g., around nail, entire toe, at foot joint)      Right big toe in the middle to the top of the toe 3. PAIN: How bad is the pain?    (Scale 1-10; or mild, moderate, severe)     8/10 4. APPEARANCE: What does the toe look like? (e.g., redness, swelling, bruising, pallor)     Looks at a little inflamed but she can't tell  5. CAUSE: What do you think is causing the toe pain?     no 6. OTHER SYMPTOMS: Do you have any other symptoms? (e.g., leg pain, rash, fever, numbness)     no  Protocols used: Toe Pain-A-AH

## 2024-07-06 ENCOUNTER — Encounter: Payer: Self-pay | Admitting: Family Medicine

## 2024-07-06 ENCOUNTER — Ambulatory Visit (INDEPENDENT_AMBULATORY_CARE_PROVIDER_SITE_OTHER): Admitting: Family Medicine

## 2024-07-06 VITALS — BP 124/70 | HR 81 | Resp 16 | Ht 67.0 in | Wt 243.0 lb

## 2024-07-06 DIAGNOSIS — E1142 Type 2 diabetes mellitus with diabetic polyneuropathy: Secondary | ICD-10-CM

## 2024-07-06 DIAGNOSIS — M1990 Unspecified osteoarthritis, unspecified site: Secondary | ICD-10-CM | POA: Diagnosis not present

## 2024-07-06 DIAGNOSIS — N183 Chronic kidney disease, stage 3 unspecified: Secondary | ICD-10-CM

## 2024-07-06 DIAGNOSIS — M79674 Pain in right toe(s): Secondary | ICD-10-CM | POA: Diagnosis not present

## 2024-07-06 NOTE — Progress Notes (Signed)
 Patient ID: Hailey Howard, female    DOB: [birth year], 68 y.o.   MRN: [id number]  PCP: Bernardo Fend, DO  Chief Complaint  Patient presents with   Toe Pain    R big toe, has not hit on anything. x2    Subjective:   Hailey Howard is a 68 y.o. female, presents to clinic with CC of the following:  HPI  Right great tow pain w/o injury started a couple days ago great toe just started hurting Discussed the use of AI scribe software for clinical note transcription with the patient, who gave verbal consent to proceed.  History of Present Illness Hailey Howard is a 68 year old female with diabetes and arthritis who presents with swelling and pain in her toe.  Toe pain and swelling - Burning, fiery sensation in the toe, particularly bothersome last night, disrupting sleep - Swelling was more pronounced yesterday, improved today - No history of trauma or injury to the area - Tylenol  500 mg, two tablets yesterday and two last night, provided reduction in pain and swelling  Peripheral neuropathy - Diabetes with associated peripheral neuropathy - Neuropathic symptoms sometimes affect legs and hands - Gabapentin  taken daily for nerve pain, usually one tablet in the morning due to sedative effects, although prescribed three per day      Patient Active Problem List   Diagnosis Date Noted   History of thyroid  cancer 11/30/2023   Lumbar radiculopathy 07/26/2023   Spinal stenosis, lumbar region, with neurogenic claudication 07/26/2023   Chronic painful diabetic neuropathy (HCC) 07/26/2023   Chronic pain syndrome 07/26/2023   History of iron  deficiency anemia 02/18/2023   S/P hip replacement 02/24/2021   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   Laryngospasms 10/21/2020   Hoarseness 10/21/2020   Dysphagia 10/21/2020   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/05/2020   S/P total thyroidectomy 03/14/2020   Multinodular goiter 03/04/2020   Chronic abdominal pain 11/29/2019    Anemia of chronic kidney failure, stage 3 (moderate) (HCC) 08/17/2019   Abdominal pain, recurrent 07/01/2019   Irritable bowel syndrome with constipation 07/01/2019   IDA (iron  deficiency anemia) 06/29/2019   Type 2 diabetes mellitus with both eyes affected by mild nonproliferative retinopathy without macular edema, with long-term current use of insulin  (HCC) 09/12/2018   Type 2 diabetes mellitus with vascular disease (HCC) 09/12/2018   Uncontrolled type 2 diabetes mellitus with hyperglycemia (HCC) 09/12/2018   Morbid obesity (HCC) 09/12/2018   Elevation of muscle enzyme 08/30/2018   Elevated erythrocyte sedimentation rate 08/30/2018   Myalgia 08/15/2018   Chronic pain of both shoulders 08/15/2018   Sacroiliitis (HCC) 02/15/2018   Schatzki's ring 06/21/2017   Chronic superficial gastritis without bleeding 06/21/2017   Gastroesophageal reflux disease without esophagitis 04/20/2017   Coronary artery disease 08/02/2016   Hyperlipidemia 08/02/2016   Hypertension 08/02/2016   Tachycardia 08/02/2016   Mesenteric mass 08/02/2016   Bursitis of left shoulder 05/07/2016   Rotator cuff tendinitis, left 05/07/2016   Impingement syndrome of left shoulder 05/07/2016   Abnormal ECG 12/24/2015   DM type 2 with diabetic peripheral neuropathy (HCC) 09/19/2015   Insulin -treated type 2 diabetes mellitus (HCC) 09/19/2015   Uncontrolled type 2 diabetes mellitus with hyperglycemia, with long-term current use of insulin  (HCC) 09/19/2015   Chronic kidney disease, stage III (moderate) (HCC) 09/10/2015   Hypokalemia 09/10/2015   Bundle branch block 09/08/2015   Edema 06/16/2015   Atrial fibrillation (HCC) 05/01/2014   A-fib (HCC) 05/01/2014   Hypothyroidism 05/01/2014  Current Outpatient Medications:    acetaminophen  (TYLENOL ) 500 MG tablet, Take 2 tablets (1,000 mg total) by mouth every 8 (eight) hours., Disp: 90 tablet, Rfl: 2   amLODipine  (NORVASC ) 5 MG tablet, Take 2.5 mg by mouth in the  morning., Disp: , Rfl:    atorvastatin  (LIPITOR) 40 MG tablet, Take 1 tablet (40 mg total) by mouth daily at 12 noon., Disp: 90 tablet, Rfl: 1   Cholecalciferol 25 MCG (1000 UT) tablet, Take 1 tablet by mouth daily at 12 noon., Disp: , Rfl:    dicyclomine  (BENTYL ) 20 MG tablet, Take 20 mg by mouth 2 (two) times daily as needed for spasms. , Disp: , Rfl:    famotidine  (PEPCID ) 20 MG tablet, Take 1 tablet (20 mg total) by mouth 2 (two) times daily., Disp: 60 tablet, Rfl: 0   fexofenadine  (ALLEGRA  ALLERGY) 180 MG tablet, Take 1 tablet (180 mg total) by mouth daily., Disp: 90 tablet, Rfl: 1   FIASP  100 UNIT/ML SOLN, Inject 8-16 Units into the skin See admin instructions. Inject 12 units subcutaneously in the morning, inject 16 units subcutaneously with lunch & inject 8 units subcutaneously with supper., Disp: , Rfl:    fluticasone  (FLONASE ) 50 MCG/ACT nasal spray, Place 1 spray into both nostrils daily as needed for allergies., Disp: , Rfl:    gabapentin  (NEURONTIN ) 300 MG capsule, TAKE 1 CAPSULE BY MOUTH DAILY, Disp: 100 capsule, Rfl: 1   insulin  glargine (LANTUS  SOLOSTAR) 100 UNIT/ML Solostar Pen, Inject 28 Units into the skin at bedtime., Disp: , Rfl:    Iron -Vitamin C  (VITRON-C) 65-125 MG TABS, Take 1 tablet by mouth every evening., Disp: , Rfl:    levothyroxine  (SYNTHROID ) 137 MCG tablet, Take 137 mcg by mouth daily before breakfast., Disp: , Rfl:    linaclotide (LINZESS) 72 MCG capsule, Take 72 mcg by mouth daily as needed (constipation.)., Disp: , Rfl:    Magnesium  400 MG CAPS, Take 400 mg by mouth in the morning., Disp: , Rfl:    Menthol , Topical Analgesic, (ICY HOT EX), Apply 1 Application topically as needed (pain.)., Disp: , Rfl:    Menthol -Methyl Salicylate (SALONPAS PAIN RELIEF PATCH EX), Place 1 patch onto the skin daily as needed (pain.)., Disp: , Rfl:    metFORMIN  (GLUCOPHAGE ) 500 MG tablet, Take 500-1,000 mg by mouth See admin instructions. Take 1 tablet (500 mg) by mouth in the morning  & take 2 tablets (1000 mg) by mouth at night., Disp: , Rfl:    metoprolol succinate (TOPROL-XL) 25 MG 24 hr tablet, Take 25 mg by mouth at bedtime., Disp: , Rfl:    Multiple Vitamin (MULTIVITAMIN WITH MINERALS) TABS tablet, Take 1 tablet by mouth daily., Disp: , Rfl:    omeprazole (PRILOSEC) 40 MG capsule, Take 40 mg by mouth in the morning., Disp: , Rfl:    Semaglutide (OZEMPIC, 0.25 OR 0.5 MG/DOSE, [place]), Inject 0.5 mg into the skin every Thursday., Disp: , Rfl:    spironolactone  (ALDACTONE ) 50 MG tablet, Take 50 mg by mouth at bedtime., Disp: , Rfl:    telmisartan (MICARDIS) 80 MG tablet, Take 80 mg by mouth at bedtime., Disp: , Rfl:    torsemide (DEMADEX) 10 MG tablet, Take 10 mg by mouth in the morning., Disp: , Rfl:    Allergies  Allergen Reactions   Iodinated Contrast Media Anaphylaxis, Hives and Itching   Oxycodone -Acetaminophen  Hives   Etodolac Itching     Social History   Tobacco Use   Smoking status: Never   Smokeless  tobacco: Never  Vaping Use   Vaping status: Never Used  Substance Use Topics   Alcohol use: No   Drug use: No      Chart Review Today: I personally reviewed active problem list, medication list, allergies, family history, social history, health maintenance, notes from last encounter, lab results, imaging with the patient/caregiver today.    Review of Systems  All other systems reviewed and are negative.      Objective:   Vitals:   07/06/24 0955  BP: 124/70  Pulse: 81  Resp: 16  SpO2: 98%  Weight: 243 lb (110.2 kg)  Height: 5' 7 (1.702 m)    Body mass index is 38.06 kg/m.  Physical Exam Vitals and nursing note reviewed.  Constitutional:      General: She is not in acute distress.    Appearance: Normal appearance. She is well-developed. She is obese. She is not ill-appearing, toxic-appearing or diaphoretic.  HENT:     Head: Normocephalic and atraumatic.     Right Ear: External ear normal.     Left Ear: External ear normal.      Nose: Nose normal.  Eyes:     General: No scleral icterus.       Right eye: No discharge.        Left eye: No discharge.     Conjunctiva/sclera: Conjunctivae normal.  Neck:     Trachea: No tracheal deviation.  Cardiovascular:     Rate and Rhythm: Normal rate.  Pulmonary:     Effort: Pulmonary effort is normal. No respiratory distress.     Breath sounds: No stridor.  Musculoskeletal:     Right foot: Normal range of motion and normal capillary refill. Swelling (mild over right great toe) present. No tenderness or bony tenderness. Normal pulse.  Feet:     Right foot:     Skin integrity: Skin integrity normal. No erythema or warmth.     Toenail Condition: Right toenails are normal.  Skin:    General: Skin is warm and dry.     Findings: No rash.  Neurological:     Mental Status: She is alert.     Motor: No abnormal muscle tone.     Coordination: Coordination normal.     Gait: Gait normal.  Psychiatric:        Mood and Affect: Mood normal.        Behavior: Behavior normal.      Results for orders placed or performed in visit on 06/27/24  VAS US  ABI WITH/WO TBI   Collection Time: 06/27/24  9:25 AM  Result Value Ref Range   Right ABI 1.19    Left ABI 1.08        Assessment & Plan:     Assessment and Plan Assessment & Plan 1. Great toe pain, right (Primary)  Right great toe joint pain and swelling Acute pain and swelling in the right great toe joint with tenderness. Differential includes arthritis flare, gout, or infection. Improvement with Tylenol  and rest. No signs of infection. Consideration of neuropathy involvement due to diabetes. Steroids discussed for inflammation, with caution due to blood sugar impact. - Order x-ray of the right great toe. - Continue Tylenol  for pain. - Consider increasing gabapentin  if pain persists, especially at night. - Advise rest, elevation, and ice for swelling. - Instruct to send picture via MyChart if swelling or redness returns. -  Discuss potential steroid use if inflammation persists, with caution for blood sugar. - Consider podiatrist referral  if symptoms persist or x-ray is inconclusive.   - DG Toe Great Right - Ambulatory referral to Podiatry    2. DM type 2 with diabetic peripheral neuropathy (HCC) Diabetes with peripheral neuropathy contributing to potential nerve pain in the toe. Blood sugar management inconsistent due to recent medication lapse, now back to normal. Gabapentin  used for neuropathy management, potential dose adjustment discussed. - Monitor blood sugar levels closely. - Continue gabapentin  for neuropathy. - Ensure consistent use of diabetes medications.  3. Osteoarthritis, unspecified osteoarthritis type, unspecified site Osteoarthritis may contribute to joint pain and swelling. The patient mentioned full-body arthritis but did not specify any specific flare-up in other joints.  4. Stage 3 chronic kidney disease, unspecified whether stage 3a or 3b CKD (HCC) Chronic kidney disease may limit medication options. No recent labs to assess kidney function. Discussion of uric acid levels and potential gout flares due to kidney function. - Follow up with nephrologist for kidney function assessment/uric acid may be drawn with specialists, if not she can do labs here but she prefers to wait and see since sx improved so much yesterday to today  Recording duration: 13 minutes       Michelene Cower, PA-C 07/06/24 10:12 AM

## 2024-07-09 DIAGNOSIS — I10 Essential (primary) hypertension: Secondary | ICD-10-CM | POA: Diagnosis not present

## 2024-07-09 DIAGNOSIS — N1832 Chronic kidney disease, stage 3b: Secondary | ICD-10-CM | POA: Diagnosis not present

## 2024-07-17 DIAGNOSIS — H43813 Vitreous degeneration, bilateral: Secondary | ICD-10-CM | POA: Diagnosis not present

## 2024-07-17 DIAGNOSIS — H40033 Anatomical narrow angle, bilateral: Secondary | ICD-10-CM | POA: Diagnosis not present

## 2024-07-17 DIAGNOSIS — H02882 Meibomian gland dysfunction right lower eyelid: Secondary | ICD-10-CM | POA: Diagnosis not present

## 2024-07-17 DIAGNOSIS — I10 Essential (primary) hypertension: Secondary | ICD-10-CM | POA: Diagnosis not present

## 2024-07-17 DIAGNOSIS — H2513 Age-related nuclear cataract, bilateral: Secondary | ICD-10-CM | POA: Diagnosis not present

## 2024-07-17 DIAGNOSIS — H05243 Constant exophthalmos, bilateral: Secondary | ICD-10-CM | POA: Diagnosis not present

## 2024-07-17 DIAGNOSIS — H02885 Meibomian gland dysfunction left lower eyelid: Secondary | ICD-10-CM | POA: Diagnosis not present

## 2024-07-17 DIAGNOSIS — E113293 Type 2 diabetes mellitus with mild nonproliferative diabetic retinopathy without macular edema, bilateral: Secondary | ICD-10-CM | POA: Diagnosis not present

## 2024-07-17 DIAGNOSIS — N1832 Chronic kidney disease, stage 3b: Secondary | ICD-10-CM | POA: Diagnosis not present

## 2024-07-17 DIAGNOSIS — H10233 Serous conjunctivitis, except viral, bilateral: Secondary | ICD-10-CM | POA: Diagnosis not present

## 2024-07-18 ENCOUNTER — Encounter: Payer: Self-pay | Admitting: Internal Medicine

## 2024-07-19 DIAGNOSIS — N1832 Chronic kidney disease, stage 3b: Secondary | ICD-10-CM | POA: Diagnosis not present

## 2024-07-19 DIAGNOSIS — Z85850 Personal history of malignant neoplasm of thyroid: Secondary | ICD-10-CM | POA: Diagnosis not present

## 2024-07-19 DIAGNOSIS — E1142 Type 2 diabetes mellitus with diabetic polyneuropathy: Secondary | ICD-10-CM | POA: Diagnosis not present

## 2024-07-19 DIAGNOSIS — E890 Postprocedural hypothyroidism: Secondary | ICD-10-CM | POA: Diagnosis not present

## 2024-07-19 DIAGNOSIS — E113293 Type 2 diabetes mellitus with mild nonproliferative diabetic retinopathy without macular edema, bilateral: Secondary | ICD-10-CM | POA: Diagnosis not present

## 2024-07-19 DIAGNOSIS — I10 Essential (primary) hypertension: Secondary | ICD-10-CM | POA: Diagnosis not present

## 2024-07-19 DIAGNOSIS — E1159 Type 2 diabetes mellitus with other circulatory complications: Secondary | ICD-10-CM | POA: Diagnosis not present

## 2024-07-19 DIAGNOSIS — E785 Hyperlipidemia, unspecified: Secondary | ICD-10-CM | POA: Diagnosis not present

## 2024-07-19 DIAGNOSIS — E1122 Type 2 diabetes mellitus with diabetic chronic kidney disease: Secondary | ICD-10-CM | POA: Diagnosis not present

## 2024-07-19 DIAGNOSIS — Z794 Long term (current) use of insulin: Secondary | ICD-10-CM | POA: Diagnosis not present

## 2024-07-19 DIAGNOSIS — E1169 Type 2 diabetes mellitus with other specified complication: Secondary | ICD-10-CM | POA: Diagnosis not present

## 2024-07-20 ENCOUNTER — Ambulatory Visit: Payer: Self-pay

## 2024-07-20 ENCOUNTER — Ambulatory Visit (INDEPENDENT_AMBULATORY_CARE_PROVIDER_SITE_OTHER): Admitting: Family Medicine

## 2024-07-20 ENCOUNTER — Other Ambulatory Visit: Payer: Self-pay | Admitting: Internal Medicine

## 2024-07-20 ENCOUNTER — Encounter: Payer: Self-pay | Admitting: Family Medicine

## 2024-07-20 VITALS — BP 122/72 | HR 79 | Resp 16 | Ht 67.0 in | Wt 243.0 lb

## 2024-07-20 DIAGNOSIS — R0781 Pleurodynia: Secondary | ICD-10-CM

## 2024-07-20 DIAGNOSIS — R109 Unspecified abdominal pain: Secondary | ICD-10-CM | POA: Diagnosis not present

## 2024-07-20 DIAGNOSIS — E162 Hypoglycemia, unspecified: Secondary | ICD-10-CM

## 2024-07-20 DIAGNOSIS — M546 Pain in thoracic spine: Secondary | ICD-10-CM

## 2024-07-20 DIAGNOSIS — M1990 Unspecified osteoarthritis, unspecified site: Secondary | ICD-10-CM

## 2024-07-20 LAB — GLUCOSE, POCT (MANUAL RESULT ENTRY): POC Glucose: 95 mg/dL (ref 70–99)

## 2024-07-20 MED ORDER — METHOCARBAMOL 500 MG PO TABS: 500.0000 mg | ORAL_TABLET | Freq: Every evening | ORAL | 2 refills | Status: DC | PRN

## 2024-07-20 MED ORDER — LIDOCAINE 5 % EX PTCH: 1.0000 | MEDICATED_PATCH | CUTANEOUS | 0 refills | Status: DC

## 2024-07-20 MED ORDER — PREDNISONE 20 MG PO TABS: ORAL_TABLET | ORAL | 0 refills | Status: DC

## 2024-07-20 NOTE — Telephone Encounter (Signed)
 FYI Only or Action Required?: FYI only for provider.  Patient was last seen in primary care on 05/29/2024 by Bernardo Fend, DO.  Called Nurse Triage reporting Back Pain.  Symptoms began 2 weeks ago.  Interventions attempted: OTC medications: Icy Hot, muscle relaxers and Ice/heat application.  Symptoms are: unchanged.  Triage Disposition: See PCP When Office is Open (Within 3 Days)  Patient/caregiver understands and will follow disposition?: Yes    Copied from CRM [id number]. Topic: Clinical - Red Word Triage >> Jul 20, 2024 10:01 AM Pinkey ORN wrote: Red Word that prompted transfer to Nurse Triage: Severe Pain In Back >> Jul 20, 2024 10:03 AM Pinkey ORN wrote: Patient states she's been experiencing this sharp / stabbing pain in her back, near/between her shoulder blade.  Reason for Disposition  [1] MODERATE back pain (e.g., interferes with normal activities) AND [2] present > 3 days  Answer Assessment - Initial Assessment Questions Rubbing with [facility], no relief. Muscle Relaxers. On going for 2 weeks. Patient denies any SOB, weakness, numbness or tingling.    1. ONSET: When did the pain begin? (e.g., minutes, hours, days)     2 weeks ago  2. LOCATION: Where does it hurt? (upper, mid or lower back)     Right side under the shoulder blade  3. SEVERITY: How bad is the pain?  (e.g., Scale 1-10; mild, moderate, or severe)     7/10  4. PATTERN: Is the pain constant? (e.g., yes, no; constant, intermittent)      Constant  5. RADIATION: Does the pain shoot into your legs or somewhere else?     Middle of the back, around the front to rib cage and chest area, not as bad as the shoulder blade pain  6. CAUSE:  What do you think is causing the back pain?      Unsure   7. BACK OVERUSE:  Any recent lifting of heavy objects, strenuous work or exercise?     No  8. MEDICINES: What have you taken so far for the pain? (e.g., nothing, acetaminophen , NSAIDS)      Muscle relaxer  9. NEUROLOGIC SYMPTOMS: Do you have any weakness, numbness, or problems with bowel/bladder control?     No  10. OTHER SYMPTOMS: Do you have any other symptoms? (e.g., fever, abdomen pain, burning with urination, blood in urine)       No  Protocols used: Back Pain-A-AH

## 2024-07-20 NOTE — Progress Notes (Signed)
 Patient ID: Hailey Howard, female    DOB: 02/13/1956, 68 y.o.   MRN: [id number]  PCP: Bernardo Fend, DO  Chief Complaint  Patient presents with   Back Pain    X2 weeks. Sharp/stabbing pain between shoulder blades. Pain is constant. Has tried IcyHot, heat/ice, and muscle relaxer's- no help    Subjective:   Hailey Howard is a 68 y.o. female, presents to clinic with CC of the following:  HPI  Dr. Bernardo pt, I saw her a few weeks ago for different acute complaint Here today with 2 weeks of right lateral/anterior chest wall pain sharp constant, no injury or strain, similar sx in the past  Also she felt jittery and that glucose was low on the CGM, 68 on her reader, asked for juice  Discussed the use of AI scribe software for clinical note transcription with the patient, who gave verbal consent to proceed.  History of Present Illness Hailey Howard is a 68 year old female with costochondritis who presents with upper back pain.  Upper back pain - Pain localized between the shoulder blades and on the right side of the upper back - Onset two weeks ago, beginning suddenly while getting out of bed - Pain is constant and sharp, described as 'somebody's stabbing me' - Movement and pressure on the affected area exacerbate the pain - Pain is not significantly worsened by deep breathing - Severity is high and persistent, impacting ability to move comfortably - No associated shortness of breath, nausea, vomiting, or bowel changes  Costochondral pain - History of costochondritis with sharp, constant pain in the rib area - Previous evaluations included thoracic spine MRI and ultrasound of the abdomen and esophagus, which did not reveal kidney stones or other abnormalities  Symptom management and medication use - Current medications include gabapentin  and muscle relaxers for symptom control - Topical treatments such as Icy Hot and use of a heat pad have provided limited  relief - Has prednisone  from a previous visit for hives but is cautious about use due to concerns regarding diabetes and kidney function     Patient Active Problem List   Diagnosis Date Noted   History of thyroid  cancer 11/30/2023   Lumbar radiculopathy 07/26/2023   Spinal stenosis, lumbar region, with neurogenic claudication 07/26/2023   Chronic painful diabetic neuropathy (HCC) 07/26/2023   Chronic pain syndrome 07/26/2023   History of iron  deficiency anemia 02/18/2023   S/P hip replacement 02/24/2021   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   Laryngospasms 10/21/2020   Hoarseness 10/21/2020   Dysphagia 10/21/2020   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/05/2020   S/P total thyroidectomy 03/14/2020   Multinodular goiter 03/04/2020   Chronic abdominal pain 11/29/2019   Anemia of chronic kidney failure, stage 3 (moderate) (HCC) 08/17/2019   Abdominal pain, recurrent 07/01/2019   Irritable bowel syndrome with constipation 07/01/2019   IDA (iron  deficiency anemia) 06/29/2019   Type 2 diabetes mellitus with both eyes affected by mild nonproliferative retinopathy without macular edema, with long-term current use of insulin  (HCC) 09/12/2018   Type 2 diabetes mellitus with vascular disease (HCC) 09/12/2018   Uncontrolled type 2 diabetes mellitus with hyperglycemia (HCC) 09/12/2018   Morbid obesity (HCC) 09/12/2018   Elevation of muscle enzyme 08/30/2018   Elevated erythrocyte sedimentation rate 08/30/2018   Myalgia 08/15/2018   Chronic pain of both shoulders 08/15/2018   Sacroiliitis (HCC) 02/15/2018   Schatzki's ring 06/21/2017   Chronic superficial gastritis without bleeding 06/21/2017   Gastroesophageal reflux disease without  esophagitis 04/20/2017   Coronary artery disease 08/02/2016   Hyperlipidemia 08/02/2016   Hypertension 08/02/2016   Tachycardia 08/02/2016   Mesenteric mass 08/02/2016   Bursitis of left shoulder 05/07/2016   Rotator cuff tendinitis, left 05/07/2016    Impingement syndrome of left shoulder 05/07/2016   Abnormal ECG 12/24/2015   DM type 2 with diabetic peripheral neuropathy (HCC) 09/19/2015   Insulin -treated type 2 diabetes mellitus (HCC) 09/19/2015   Uncontrolled type 2 diabetes mellitus with hyperglycemia, with long-term current use of insulin  (HCC) 09/19/2015   Chronic kidney disease, stage III (moderate) (HCC) 09/10/2015   Hypokalemia 09/10/2015   Bundle branch block 09/08/2015   Edema 06/16/2015   Atrial fibrillation (HCC) 05/01/2014   A-fib (HCC) 05/01/2014   Hypothyroidism 05/01/2014      Current Outpatient Medications:    acetaminophen  (TYLENOL ) 500 MG tablet, Take 2 tablets (1,000 mg total) by mouth every 8 (eight) hours., Disp: 90 tablet, Rfl: 2   amLODipine  (NORVASC ) 5 MG tablet, Take 2.5 mg by mouth in the morning., Disp: , Rfl:    atorvastatin  (LIPITOR) 40 MG tablet, Take 1 tablet (40 mg total) by mouth daily at 12 noon., Disp: 90 tablet, Rfl: 1   Cholecalciferol 25 MCG (1000 UT) tablet, Take 1 tablet by mouth daily at 12 noon., Disp: , Rfl:    dicyclomine  (BENTYL ) 20 MG tablet, Take 20 mg by mouth 2 (two) times daily as needed for spasms. , Disp: , Rfl:    famotidine  (PEPCID ) 20 MG tablet, Take 1 tablet (20 mg total) by mouth 2 (two) times daily., Disp: 60 tablet, Rfl: 0   fexofenadine  (ALLEGRA  ALLERGY) 180 MG tablet, Take 1 tablet (180 mg total) by mouth daily., Disp: 90 tablet, Rfl: 1   FIASP  100 UNIT/ML SOLN, Inject 8-16 Units into the skin See admin instructions. Inject 12 units subcutaneously in the morning, inject 16 units subcutaneously with lunch & inject 8 units subcutaneously with supper., Disp: , Rfl:    fluticasone  (FLONASE ) 50 MCG/ACT nasal spray, Place 1 spray into both nostrils daily as needed for allergies., Disp: , Rfl:    gabapentin  (NEURONTIN ) 300 MG capsule, TAKE 1 CAPSULE BY MOUTH DAILY, Disp: 100 capsule, Rfl: 1   insulin  glargine (LANTUS  SOLOSTAR) 100 UNIT/ML Solostar Pen, Inject 28 Units into the skin  at bedtime., Disp: , Rfl:    Iron -Vitamin C  (VITRON-C) 65-125 MG TABS, Take 1 tablet by mouth every evening., Disp: , Rfl:    levothyroxine  (SYNTHROID ) 137 MCG tablet, Take 137 mcg by mouth daily before breakfast., Disp: , Rfl:    linaclotide (LINZESS) 72 MCG capsule, Take 72 mcg by mouth daily as needed (constipation.)., Disp: , Rfl:    Magnesium  400 MG CAPS, Take 400 mg by mouth in the morning., Disp: , Rfl:    Menthol , Topical Analgesic, (ICY HOT EX), Apply 1 Application topically as needed (pain.)., Disp: , Rfl:    Menthol -Methyl Salicylate (SALONPAS PAIN RELIEF PATCH EX), Place 1 patch onto the skin daily as needed (pain.)., Disp: , Rfl:    metFORMIN  (GLUCOPHAGE ) 500 MG tablet, Take 500-1,000 mg by mouth See admin instructions. Take 1 tablet (500 mg) by mouth in the morning & take 2 tablets (1000 mg) by mouth at night., Disp: , Rfl:    methocarbamol  (ROBAXIN ) 500 MG tablet, Take 1 tablet (500 mg total) by mouth at bedtime as needed for muscle spasms., Disp: 30 tablet, Rfl: 2   metoprolol succinate (TOPROL-XL) 25 MG 24 hr tablet, Take 25 mg by mouth at  bedtime., Disp: , Rfl:    Multiple Vitamin (MULTIVITAMIN WITH MINERALS) TABS tablet, Take 1 tablet by mouth daily., Disp: , Rfl:    omeprazole (PRILOSEC) 40 MG capsule, Take 40 mg by mouth in the morning., Disp: , Rfl:    Semaglutide,0.25 or 0.5MG /DOS, 2 MG/3ML SOPN, Inject 1 mg into the skin once a week., Disp: , Rfl:    spironolactone  (ALDACTONE ) 50 MG tablet, Take 50 mg by mouth at bedtime., Disp: , Rfl:    telmisartan (MICARDIS) 80 MG tablet, Take 80 mg by mouth at bedtime., Disp: , Rfl:    torsemide (DEMADEX) 10 MG tablet, Take 10 mg by mouth in the morning., Disp: , Rfl:    Allergies  Allergen Reactions   Iodinated Contrast Media Anaphylaxis, Hives and Itching   Oxycodone -Acetaminophen  Hives   Etodolac Itching     Social History   Tobacco Use   Smoking status: Never   Smokeless tobacco: Never  Vaping Use   Vaping status: Never  Used  Substance Use Topics   Alcohol use: No   Drug use: No      Chart Review Today: I personally reviewed active problem list, medication list, allergies, family history, social history, health maintenance, notes from last encounter, lab results, imaging with the patient/caregiver today.   Review of Systems  Constitutional: Negative.   HENT: Negative.    Eyes: Negative.   Respiratory: Negative.  Negative for apnea, cough, shortness of breath and wheezing.   Cardiovascular:  Positive for chest pain. Negative for palpitations and leg swelling.  Gastrointestinal: Negative.   Endocrine: Negative.   Genitourinary: Negative.   Musculoskeletal: Negative.   Skin: Negative.   Allergic/Immunologic: Negative.   Neurological: Negative.   Hematological: Negative.   Psychiatric/Behavioral: Negative.    All other systems reviewed and are negative.      Objective:   Vitals:   07/20/24 1123  BP: 122/72  Pulse: 79  Resp: 16  SpO2: 99%  Weight: 243 lb (110.2 kg)  Height: 5' 7 (1.702 m)    Body mass index is 38.06 kg/m.  Physical Exam Vitals and nursing note reviewed.  Constitutional:      General: She is not in acute distress.    Appearance: Normal appearance. She is well-developed. She is obese. She is not ill-appearing, toxic-appearing or diaphoretic.     Interventions: She is not intubated. HENT:     Head: Normocephalic and atraumatic.     Right Ear: External ear normal.     Left Ear: External ear normal.     Nose: Nose normal.  Eyes:     General: No scleral icterus.       Right eye: No discharge.        Left eye: No discharge.     Conjunctiva/sclera: Conjunctivae normal.  Neck:     Trachea: No tracheal deviation.  Cardiovascular:     Rate and Rhythm: Normal rate.  Pulmonary:     Effort: Pulmonary effort is normal. No tachypnea, accessory muscle usage, respiratory distress or retractions. She is not intubated.     Breath sounds: No stridor. No decreased breath  sounds, wheezing, rhonchi or rales.  Chest:     Chest wall: Tenderness present. No mass, deformity, swelling, crepitus or edema. There is no dullness to percussion.     Comments: Right lower anterior and right lateral ribs and chest wall Musculoskeletal:     Thoracic back: No swelling, deformity, spasms, tenderness or bony tenderness. Normal range of motion.  Skin:  General: Skin is warm and dry.     Findings: No rash.  Neurological:     Mental Status: She is alert.     Motor: No abnormal muscle tone.     Coordination: Coordination normal.     Gait: Gait normal.  Psychiatric:        Mood and Affect: Mood normal.        Behavior: Behavior normal.           Assessment & Plan:     ICD-10-CM   1. Acute thoracic back pain, unspecified back pain laterality  M54.6 lidocaine  (LIDODERM ) 5 %    2. Rib pain on right side  R07.81 predniSONE  (DELTASONE ) 20 MG tablet    lidocaine  (LIDODERM ) 5 %    DG Chest 2 View    DG Ribs Unilateral Right    3. Right flank pain  R10.9 predniSONE  (DELTASONE ) 20 MG tablet    lidocaine  (LIDODERM ) 5 %    DG Chest 2 View    DG Ribs Unilateral Right    4. Hypoglycemia  E16.2 POCT Glucose (CBG)     Right anterior and lateral rib/chest wall pain onset 2 weeks ago, constant, sharp, worse with palpation and movement, no respiratory sx, no injury or strain, similar sx in the past that were worked up various ways over several years - suspected sx were costochondritis.    Assessment and Plan Assessment & Plan Right chest wall pain acute right chest wall pain- similar to past sx suspected to be due to costochondritis.  Current sx persisting for two weeks. Pain is sharp, constant, and exacerbated by movement and palpation. Differential includes musculoskeletal inflammation, with no evidence of thoracic spine or abdominal pathology, pneumonia, or rib fracture. Pain likely due to inflammation in the chest wall and cartilage-  Reproducible with movement and  palpation, lungs CTA A&P and laterally Prescribe prednisone  - expect potential side effects on blood glucose levels for about a week which should improve when steroids are done, expect some sx improvement in 2-3 d - Order lidocaine  patches for topical pain relief. - avoid frequent massaging or palpation to the area this is likely to perpetuate inflammation and tenderness - Advise use of topical diclofenac and heat application as needed. - Instruct to monitor for worsening symptoms, such as difficulty breathing, and seek urgent care if necessary. - Order imaging for chest and ribs if symptoms worsen or persist. - Advise to avoid twisting movements and ensure deep breathing to prevent pneumonia.  Type 2 diabetes mellitus with diabetic peripheral neuropathy Diabetes management complicated by potential steroid use, which may elevate blood glucose levels. Blood glucose levels may rise during steroid treatment but should return to baseline after completion. - Monitor blood glucose levels closely, especially during steroid treatment.  Hypoglycemia, possible Possible hypoglycemia episode with blood sugar reading of 68. Symptoms not clearly - feels weird, jittery, given juice at beginning of OV, rechecked glucose Results for orders placed or performed in visit on 07/20/24  POCT Glucose (CBG)   Collection Time: 07/20/24 11:55 AM  Result Value Ref Range   POC Glucose 95 70 - 99 mg/dl  Improved Safe for pt to go, sx have resolved    Recording duration: 18 minutes       Michelene Cower, PA-C 07/20/24 11:30 AM

## 2024-07-28 ENCOUNTER — Encounter: Payer: Self-pay | Admitting: Internal Medicine

## 2024-07-30 ENCOUNTER — Encounter (HOSPITAL_BASED_OUTPATIENT_CLINIC_OR_DEPARTMENT_OTHER): Payer: Self-pay | Admitting: Student

## 2024-07-30 ENCOUNTER — Ambulatory Visit (INDEPENDENT_AMBULATORY_CARE_PROVIDER_SITE_OTHER): Admitting: Student

## 2024-07-30 DIAGNOSIS — M25552 Pain in left hip: Secondary | ICD-10-CM | POA: Diagnosis not present

## 2024-07-30 DIAGNOSIS — Z96642 Presence of left artificial hip joint: Secondary | ICD-10-CM

## 2024-07-30 NOTE — Progress Notes (Signed)
 Chief Complaint: Left hip pain     History of Present Illness:    Hailey Howard is a 68 y.o. female who presents today for evaluation of left hip pain.  She is seeking a second opinion due to persistent pain over the last 3 years since undergoing a left total hip arthroplasty.  Her symptoms have been reportedly getting worse and she is experiencing pain in the left leg from the posterior and lateral left hip down to the left knee.  She does experience some groin pain as well.  Review of records show that she also underwent a left trochanteric bursectomy earlier this year.  She has been taking Tylenol  and using IcyHot and Voltaren.  Patient does also have known lumbar issues since 2019 and has undergone multiple ESI and SI joint injections.  Last lumbar MRI was in April 2024 showing only findings of mild lumbar spondylosis per report.   Surgical History:   Left hip THA 2022, trochanteric bursectomy March 2025  PMH/PSH/Family History/Social History/Meds/Allergies:    Past Medical History:  Diagnosis Date   Anemia of chronic kidney failure, stage 3 (moderate) (HCC) 08/17/2019   Anemia of chronic renal failure    Aortic atherosclerosis (HCC)    Arthritis    Bradycardia    Cataract    CKD (chronic kidney disease), stage III (HCC)    Colon polyps    Coronary artery disease 05/03/2001   a.) LHC 05/03/2001: 95% pLCx, 95% mLAD-1, 75% mLAD-2, 95% D2 --> LAD dissection occurred during procedure --> CVTS consult; b.) s/p 3v CABG 05/03/2001: LIMA-LAD,SVG-OM2, SVG-D2; c.) LHC/PCI 02/12/2004: 25% pRCA, 25% mRCA, 95% RPDA, 25% LM, 25% pLCx, 95% mLCx (3.0 x 13 mm Cypher), 50% OM3, 25% pLAD, 100% mLAD, 25% D2, 25% LIMA-LAD, 100% SVG-OM2, 50% SVG-D2   Coronary artery dissection 05/03/2001   a.) LAD dissection occurred during diagnostic PCI at [facility]; IABP placed and patient sent for emergency CABG   Diabetic neuropathy (HCC)    Dysphagia    Exertional dyspnea     Gastritis    GERD (gastroesophageal reflux disease)    Headache    History of hiatal hernia    History of shingles    HLD (hyperlipidemia)    Hypertension    IBS (irritable bowel syndrome)    IDA (iron  deficiency anemia)    a.) followed by hematology; receiving iron  infusions PRN   Long-term use of aspirin  therapy    Mesenteric mass    Muscle tension dysphonia    PAF (paroxysmal atrial fibrillation) (HCC)    a.) CHA2DS2VASc = 5 (age, sex, HTN, vascular disease, T2DM) as of 02/06/2024; b.) rate/rhythm maintained on oral metoprolol succinate; no chronic OAC   Peripheral vascular disease (HCC)    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   a.) s/p total thyroidectomy for thyroid  cancer   Right BBB/left ant fasc block    Rotator cuff tendinitis, left    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   a.) SVG-OM2, SVG-D2, LIMA-LAD   SVT (supraventricular tachycardia) (HCC)    T2DM (type 2 diabetes mellitus) (HCC)    Thyroid  cancer (HCC)    a.) s/p total thyroidectomy 03/14/2020   Trochanteric bursitis of left hip    Vitamin D  deficiency    Past Surgical History:  Procedure Laterality Date   ABDOMINAL HYSTERECTOMY  CHOLECYSTECTOMY     COLONOSCOPY WITH PROPOFOL  N/A 12/20/2016   Procedure: COLONOSCOPY WITH PROPOFOL ;  Surgeon: Lamar ONEIDA Holmes, MD;  Location: [facility] ENDOSCOPY;  Service: Endoscopy;  Laterality: N/A;   COLONOSCOPY WITH PROPOFOL  N/A 01/19/2022   Procedure: COLONOSCOPY WITH PROPOFOL ;  Surgeon: Maryruth Ole ONEIDA, MD;  Location: [facility] ENDOSCOPY;  Service: Endoscopy;  Laterality: N/A;   CORONARY ANGIOPLASTY WITH STENT PLACEMENT Left 02/12/2004   Procedure: CORONARY ANGIOPLASTY WITH STENT PLACEMENT; Location: [facility]; Surgeon: Miquel Ellen, MD   CORONARY ARTERY BYPASS GRAFT N/A 05/03/2001   Procedure: CORONARY ARTERY BYPASS GRAFT; Location: [facility]; Surgeon: Bernardino Lares, MD   ESOPHAGOGASTRODUODENOSCOPY N/A 03/15/2023   Procedure: ESOPHAGOGASTRODUODENOSCOPY (EGD);  Surgeon: Maryruth Ole ONEIDA, MD;   Location: [facility] ENDOSCOPY;  Service: Endoscopy;  Laterality: N/A;   ESOPHAGOGASTRODUODENOSCOPY (EGD) WITH PROPOFOL  N/A 04/27/2017   Procedure: ESOPHAGOGASTRODUODENOSCOPY (EGD) WITH PROPOFOL ;  Surgeon: Holmes Lamar ONEIDA, MD;  Location: [facility] ENDOSCOPY;  Service: Endoscopy;  Laterality: N/A;   EUS N/A 04/24/2015   Procedure: ESOPHAGEAL ENDOSCOPIC ULTRASOUND (EUS) RADIAL;  Surgeon: Ozell Eva Montes, MD;  Location: [facility] ENDOSCOPY;  Service: Endoscopy;  Laterality: N/A;   LEFT HEART CATH AND CORONARY ANGIOGRAPHY Left 05/03/2001   Procedure: LEFT HEART CATH AND CORONARY ANGIOGRAPHY; Location: [facility]; Surgeon: Ozell Estelle, MD   TOTAL HIP ARTHROPLASTY Left 02/24/2021   Procedure: TOTAL HIP ARTHROPLASTY ANTERIOR APPROACH;  Surgeon: Kathlynn Ozell, MD;  Location: [facility] ORS;  Service: Orthopedics;  Laterality: Left;   TOTAL THYROIDECTOMY N/A 03/14/2020   Procedure: TOTAL THYROIDECTOMY; Location: [facility]   Social History   Socioeconomic History   Marital status: Married    Spouse name: Virgil   Number of children: Not on file   Years of education: Not on file   Highest education level: Associate degree: academic program  Occupational History   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    Social Drivers of Corporate investment banker Strain: Low Risk  (05/26/2024)   Overall Financial Resource Strain (CARDIA)    Difficulty of Paying Living Expenses: Not hard at all  Food Insecurity: No Food Insecurity (05/26/2024)   Hunger Vital Sign    Worried About Running Out of Food in the Last Year: Never true    Ran Out of Food in the Last Year: Never  true  Transportation Needs: No Transportation Needs (05/26/2024)   PRAPARE - Administrator, Civil Service (Medical): No    Lack of Transportation (Non-Medical): No  Physical Activity: Insufficiently Active (05/26/2024)   Exercise Vital Sign    Days of Exercise per Week: 2 days    Minutes of Exercise per Session: 20 min  Stress: No Stress Concern Present (05/26/2024)   [employer] of Occupational Health - Occupational Stress Questionnaire    Feeling of Stress: Not at all  Social Connections: Unknown (05/26/2024)   Social Connection and Isolation Panel    Frequency of Communication with Friends and Family: Twice a week    Frequency of Social Gatherings with Friends and Family: Patient declined    Attends Religious Services: More than 4 times per  year    Active Member of Clubs or Organizations: Yes    Attends Engineer, structural: More than 4 times per year    Marital Status: Married   Family History  Problem Relation Age of Onset   Arthritis Sister    Heart disease Sister    Arthritis Sister    Arthritis Sister    Breast cancer Neg Hx    Allergies  Allergen Reactions   Iodinated Contrast Media Anaphylaxis, Hives and Itching   Oxycodone -Acetaminophen  Hives   Etodolac Itching   Current Outpatient Medications  Medication Sig Dispense Refill   acetaminophen  (TYLENOL ) 500 MG tablet Take 2 tablets (1,000 mg total) by mouth every 8 (eight) hours. 90 tablet 2   amLODipine  (NORVASC ) 5 MG tablet Take 2.5 mg by mouth in the morning.     atorvastatin  (LIPITOR) 40 MG tablet Take 1 tablet (40 mg total) by mouth daily at 12 noon. 90 tablet 1   Cholecalciferol 25 MCG (1000 UT) tablet Take 1 tablet by mouth daily at 12 noon.     dicyclomine  (BENTYL ) 20 MG tablet Take 20 mg by mouth 2 (two) times daily as needed for spasms.      famotidine  (PEPCID ) 20 MG tablet Take 1 tablet (20 mg total) by mouth 2 (two) times daily. 60 tablet 0   fexofenadine  (ALLEGRA  ALLERGY) 180 MG  tablet Take 1 tablet (180 mg total) by mouth daily. 90 tablet 1   FIASP  100 UNIT/ML SOLN Inject 8-16 Units into the skin See admin instructions. Inject 12 units subcutaneously in the morning, inject 16 units subcutaneously with lunch & inject 8 units subcutaneously with supper.     fluticasone  (FLONASE ) 50 MCG/ACT nasal spray Place 1 spray into both nostrils daily as needed for allergies.     gabapentin  (NEURONTIN ) 300 MG capsule TAKE 1 CAPSULE BY MOUTH DAILY 100 capsule 1   insulin  glargine (LANTUS  SOLOSTAR) 100 UNIT/ML Solostar Pen Inject 28 Units into the skin at bedtime.     Iron -Vitamin C  (VITRON-C) 65-125 MG TABS Take 1 tablet by mouth every evening.     levothyroxine  (SYNTHROID ) 137 MCG tablet Take 137 mcg by mouth daily before breakfast.     lidocaine  (LIDODERM ) 5 % Place 1 patch onto the skin daily. Remove & Discard patch within 12 hours or as directed by MD 10 patch 0   linaclotide (LINZESS) 72 MCG capsule Take 72 mcg by mouth daily as needed (constipation.).     Magnesium  400 MG CAPS Take 400 mg by mouth in the morning.     Menthol , Topical Analgesic, (ICY HOT EX) Apply 1 Application topically as needed (pain.).     Menthol -Methyl Salicylate (SALONPAS PAIN RELIEF PATCH EX) Place 1 patch onto the skin daily as needed (pain.).     metFORMIN  (GLUCOPHAGE ) 500 MG tablet Take 500-1,000 mg by mouth See admin instructions. Take 1 tablet (500 mg) by mouth in the morning & take 2 tablets (1000 mg) by mouth at night.     methocarbamol  (ROBAXIN ) 500 MG tablet Take 1 tablet (500 mg total) by mouth at bedtime as needed for muscle spasms. 30 tablet 2   metoprolol succinate (TOPROL-XL) 25 MG 24 hr tablet Take 25 mg by mouth at bedtime.     Multiple Vitamin (MULTIVITAMIN WITH MINERALS) TABS tablet Take 1 tablet by mouth daily.     omeprazole (PRILOSEC) 40 MG capsule Take 40 mg by mouth in the morning.     predniSONE  (DELTASONE ) 20 MG tablet 2 tabs  poqday 1-3, 1 tabs poqday 4-6 9 tablet 0    Semaglutide,0.25 or 0.5MG /DOS, 2 MG/3ML SOPN Inject 1 mg into the skin once a week.     spironolactone  (ALDACTONE ) 50 MG tablet Take 50 mg by mouth at bedtime.     telmisartan (MICARDIS) 80 MG tablet Take 80 mg by mouth at bedtime.     torsemide (DEMADEX) 10 MG tablet Take 10 mg by mouth in the morning.     No current facility-administered medications for this visit.   No results found.  Review of Systems:   A ROS was performed including pertinent positives and negatives as documented in the HPI.  Physical Exam :   Constitutional: NAD and appears stated age Neurological: Alert and oriented Psych: Appropriate affect and cooperative There were no vitals taken for this visit.   Comprehensive Musculoskeletal Exam:    On exam there is tenderness over the lower lumbar spine, posterior left hip musculature, and left greater trochanter.  Passive left hip range of motion is to 110 degrees flexion and 20 degrees of external and internal rotation.  Negative straight leg raise.  Bilateral knee flexion/extension and ankle dorsiflexion/plantarflexion strength is 5/5.  Imaging:    MRI left hip from 10/28/2023: IMPRESSION: 1. Left total hip arthroplasty without MRI evidence of complicating features. 2. Mild to moderate spurring of the right (contralateral) femoral head and right lateral acetabulum. 3. Small lipoma along the right (contralateral) hip adductor musculature.    Assessment:   68 y.o. female who is experiencing persistent pain in the left hip and leg.  Her main concern is that the symptoms began around the time of undergoing a left total hip arthroplasty approximately 3 years ago, and is concerned there may be something wrong.  She does report experiencing some groin pain, although her pain pattern does appear more radicular in nature and given her low back history I am suspicious that this is more the cause.  No significant lower extremity weakness is noted.  Patient however would like  to rule out a cause from her hip, so I will plan to refer her over to Dr. Vernetta to have this evaluated further.  She is agreeable to this plan.  Plan :    - Referral to Dr. Vernetta for second opinion regarding her THA     I personally saw and evaluated the patient, and participated in the management and treatment plan.  Leonce Reveal, PA-C Orthopedics

## 2024-07-31 ENCOUNTER — Encounter: Payer: Self-pay | Admitting: Internal Medicine

## 2024-07-31 ENCOUNTER — Ambulatory Visit: Admitting: Internal Medicine

## 2024-07-31 VITALS — BP 130/78 | HR 83 | Temp 98.1°F | Resp 16 | Ht 67.0 in | Wt 248.2 lb

## 2024-07-31 DIAGNOSIS — R208 Other disturbances of skin sensation: Secondary | ICD-10-CM | POA: Diagnosis not present

## 2024-07-31 DIAGNOSIS — Z23 Encounter for immunization: Secondary | ICD-10-CM

## 2024-07-31 MED ORDER — NYSTATIN 100000 UNIT/GM EX POWD
1.0000 | Freq: Three times a day (TID) | CUTANEOUS | 0 refills | Status: AC
Start: 2024-07-31 — End: ?

## 2024-07-31 NOTE — Progress Notes (Signed)
 Acute Office Visit  Subjective:     Patient ID: Hailey Howard, female    DOB: 08-11-1956, 68 y.o.   MRN: [id number]  Chief Complaint  Patient presents with   Pruritis    Bilateral foot itching and burning    HPI Patient is in today for bilateral itching and burning of feet.   Discussed the use of AI scribe software for clinical note transcription with the patient, who gave verbal consent to proceed.  History of Present Illness Hailey Howard is a 68 year old female with diabetes and neuropathy who presents with itching and burning in her feet.  She experiences intermittent itching and burning sensations in her feet, which have recently increased in frequency and intensity. These symptoms are superficial and distinct from her neuropathy pain, affecting the bottom and sides of her feet. She has tried over-the-counter treatments like Tinactin and other creams but seeks a stronger prescription. A foot powder previously provided by a hospital was effective in relieving her symptoms.  She experiences swelling in her feet, particularly in one foot, which she attributes to past heart surgery. The swelling is alleviated by elevating her feet, and she takes torasemide 10 mg for fluid management. She has not experienced issues between her toes.   Review of Systems  Skin:  Positive for itching. Negative for rash.        Objective:    BP 130/78 (Cuff Size: Large)   Pulse 83   Temp 98.1 F (36.7 C) (Oral)   Resp 16   Ht 5' 7 (1.702 m)   Wt 248 lb 3.2 oz (112.6 kg)   SpO2 99%   BMI 38.87 kg/m    Physical Exam Constitutional:      Appearance: Normal appearance.  HENT:     Head: Normocephalic and atraumatic.  Eyes:     Conjunctiva/sclera: Conjunctivae normal.  Cardiovascular:     Rate and Rhythm: Normal rate and regular rhythm.  Pulmonary:     Effort: Pulmonary effort is normal.     Breath sounds: Normal breath sounds.  Musculoskeletal:     Right lower leg: Edema  present.     Left lower leg: Edema present.  Skin:    General: Skin is warm and dry.     Findings: No rash.  Neurological:     General: No focal deficit present.     Mental Status: She is alert. Mental status is at baseline.  Psychiatric:        Mood and Affect: Mood normal.        Behavior: Behavior normal.     No results found for any visits on 07/31/24.      Assessment & Plan:   Assessment & Plan Disturbance of skin sensation of feet Intermittent itching and burning in feet without visible skin changes. Differential includes neuropathy and skin stretching from fluid. Previous relief with foot powder suggests possible fungal involvement, despite no physical skin change. Opted for topical treatment due to lower risk profile. - Prescribed nystatin  powder, apply three times daily to affected areas. - Advised keeping feet dry and using loose-fitting slippers. - Recommended leg elevation to reduce swelling. - Increased torasemide to 20 mg daily for 2-3 days for fluid retention.  Follow-Up Scheduled follow-up appointments to monitor condition and overall health. - Attend wellness visit on September 13, 2024, at 2:00 PM. - Follow-up appointment on November 29, 2024, at 9:00 AM.  - Flu vaccine HIGH DOSE PF(Fluzone Trivalent) - nystatin  (MYCOSTATIN /NYSTOP ) powder;  Apply 1 Application topically 3 (three) times daily.  Dispense: 60 g; Refill: 0   Return for already scheduled.  Sharyle Fischer, DO

## 2024-08-06 ENCOUNTER — Other Ambulatory Visit: Payer: Self-pay | Admitting: Internal Medicine

## 2024-08-06 DIAGNOSIS — I2510 Atherosclerotic heart disease of native coronary artery without angina pectoris: Secondary | ICD-10-CM

## 2024-08-07 NOTE — Telephone Encounter (Signed)
 Requested Prescriptions  Refused Prescriptions Disp Refills   atorvastatin  (LIPITOR) 40 MG tablet [Pharmacy Med Name: Atorvastatin  Calcium  40 MG Oral Tablet] 100 tablet 2    Sig: TAKE 1 TABLET BY MOUTH DAILY AT  12 NOON     Cardiovascular:  Antilipid - Statins Failed - 08/07/2024 12:41 PM      Failed - Lipid Panel in normal range within the last 12 months    No results found for: CHOL, POCCHOL, CHOLTOT No results found for: LDLCALC, LDLC, HIRISKLDL, POCLDL, LDLDIRECT, REALLDLC, TOTLDLC No results found for: HDL, POCHDL No results found for: TRIG, POCTRIG       Passed - Patient is not pregnant      Passed - Valid encounter within last 12 months    Recent Outpatient Visits           1 week ago Burning sensation of feet   [facility] Bernardo Fend, DO   2 weeks ago Acute thoracic back pain, unspecified back pain laterality   [facility] Leavy Mole, PA-C   1 month ago [place] toe pain, right   [facility] Leavy Mole, PA-C   2 months ago Hypertension, unspecified type   [facility] Bernardo Fend, DO   4 months ago Hives   [facility] Bernardo Fend, DO       Future Appointments             In 3 months Bernardo Fend, DO [facility], [place]

## 2024-08-13 ENCOUNTER — Encounter: Payer: Self-pay | Admitting: Internal Medicine

## 2024-08-13 ENCOUNTER — Telehealth (INDEPENDENT_AMBULATORY_CARE_PROVIDER_SITE_OTHER): Admitting: Internal Medicine

## 2024-08-13 ENCOUNTER — Other Ambulatory Visit: Payer: Self-pay

## 2024-08-13 ENCOUNTER — Ambulatory Visit: Payer: Self-pay

## 2024-08-13 DIAGNOSIS — J069 Acute upper respiratory infection, unspecified: Secondary | ICD-10-CM | POA: Diagnosis not present

## 2024-08-13 DIAGNOSIS — E1159 Type 2 diabetes mellitus with other circulatory complications: Secondary | ICD-10-CM | POA: Diagnosis not present

## 2024-08-13 MED ORDER — BENZONATATE 100 MG PO CAPS: 100.0000 mg | ORAL_CAPSULE | Freq: Two times a day (BID) | ORAL | 0 refills | Status: DC | PRN

## 2024-08-13 NOTE — Progress Notes (Signed)
 Virtual Visit via Video Note  I connected with Hailey Howard on 08/13/24 at  3:40 PM EDT by a video enabled telemedicine application and verified that I am speaking with the correct person using two identifiers.  Location: Patient: Home Provider: [facility]   I discussed the limitations of evaluation and management by telemedicine and the availability of in person appointments. The patient expressed understanding and agreed to proceed.  History of Present Illness:  Discussed the use of AI scribe software for clinical note transcription with the patient, who gave verbal consent to proceed.  History of Present Illness Hailey Howard is a 68 year old female who presents with cold symptoms and congestion.  She began experiencing cold symptoms during a church service yesterday, feeling extremely cold upon returning home. Last night, she developed a productive cough with clear mucus, sneezing, nasal congestion, runny nose, watery eyes, and a sore throat. She has been using throat lozenges, Robitussin, and Alka-Seltzer Plus Cold medicine. She also has Allegra  and Zyrtec at home. No fever is present. She has severe GERD and initially thought her throat symptoms were related to acid reflux.    Observations/Objective:  General: well appearing, no acute distress ENT: conjunctiva normal appearing bilaterally, congested sounding  Skin: no rashes, cyanosis or abnormal bruising noted Neuro: answers all questions appropriately   Assessment and Plan:  Assessment & Plan Acute upper respiratory symptoms (likely viral or allergic) Acute onset of congestion, rhinorrhea, pharyngitis, and epiphora with productive cough of clear mucus, no fever. Likely viral or allergic etiology. - Recommend Zyrtec D or Allegra  D for congestion. - Continue Robitussin for cough. - Use nasal saline rinse and Flonase  nasal spray, two sprays each nostril twice daily until symptoms improve. - Encourage rest, hydration,  consider vitamin C  and zinc supplementation. - Send prescription for cough medicine to [facility] on [address]. - Advise follow-up if symptoms do not improve by the end of the week.  - benzonatate  (TESSALON ) 100 MG capsule; Take 1 capsule (100 mg total) by mouth 2 (two) times daily as needed for cough.  Dispense: 20 capsule; Refill: 0   Follow Up Instructions: as needed if symptoms worsen or fail to improve    I discussed the assessment and treatment plan with the patient. The patient was provided an opportunity to ask questions and all were answered. The patient agreed with the plan and demonstrated an understanding of the instructions.   The patient was advised to call back or seek an in-person evaluation if the symptoms worsen or if the condition fails to improve as anticipated.  I provided 8 minutes of non-face-to-face time during this encounter.   Sharyle Fischer, DO

## 2024-08-13 NOTE — Telephone Encounter (Signed)
 FYI Only or Action Required?: FYI only for provider.  Patient was last seen in primary care on 07/31/2024 by Hailey Fend, DO.  Called Nurse Triage reporting Nasal Congestion.  Symptoms began yesterday.  Interventions attempted: OTC medications: Alka-seltzer plus, robitussin.  Symptoms are: unchanged.  Triage Disposition: See PCP When Office is Open (Within 3 Days)  Patient/caregiver understands and will follow disposition?: Yes Reason for Disposition  Cough has been present for > 3 weeks  Answer Assessment - Initial Assessment Questions Alka-seltzer plus and robitussin, mild relief.   1. ONSET: When did the cough begin?      Yesterday, started off with the chills.   2. SPUTUM: Describe the color of your sputum (e.g., none, dry cough; clear, white, yellow, green)     Clear, yellow-ish  3. DIFFICULTY BREATHING: Are you having difficulty breathing? If Yes, ask: How bad is it? (e.g., mild, moderate, severe)      No  4. FEVER: Do you have a fever? If Yes, ask: What is your temperature, how was it measured, and when did it start?     No  5. CARDIAC HISTORY: Do you have any history of heart disease? (e.g., heart attack, congestive heart failure)      Bypass surgery  6. LUNG HISTORY: Do you have any history of lung disease?  (e.g., pulmonary embolus, asthma, emphysema)     No  7. OTHER SYMPTOMS: Do you have any other symptoms? (e.g., runny nose, wheezing, chest pain)       Nasal congestion (clear), sneezing, sore throat, body aches, chest pain from coughing and acid reflux  Protocols used: Cough - Acute Productive-A-AH  Copied from CRM #[id number]. Topic: Clinical - Red Word Triage >> Aug 13, 2024  9:27 AM [place] MATSU wrote: Red Word that prompted transfer to Nurse Triage: coughing, sneezing, chest discomfort, body ache, sore throat, chills.

## 2024-08-16 DIAGNOSIS — R1312 Dysphagia, oropharyngeal phase: Secondary | ICD-10-CM | POA: Diagnosis not present

## 2024-08-16 DIAGNOSIS — K581 Irritable bowel syndrome with constipation: Secondary | ICD-10-CM | POA: Diagnosis not present

## 2024-08-16 DIAGNOSIS — R1013 Epigastric pain: Secondary | ICD-10-CM | POA: Diagnosis not present

## 2024-08-20 ENCOUNTER — Other Ambulatory Visit: Payer: Self-pay | Admitting: Gastroenterology

## 2024-08-20 DIAGNOSIS — R1013 Epigastric pain: Secondary | ICD-10-CM

## 2024-08-21 DIAGNOSIS — E113293 Type 2 diabetes mellitus with mild nonproliferative diabetic retinopathy without macular edema, bilateral: Secondary | ICD-10-CM | POA: Diagnosis not present

## 2024-08-21 DIAGNOSIS — H2513 Age-related nuclear cataract, bilateral: Secondary | ICD-10-CM | POA: Diagnosis not present

## 2024-08-21 DIAGNOSIS — H40033 Anatomical narrow angle, bilateral: Secondary | ICD-10-CM | POA: Diagnosis not present

## 2024-08-21 DIAGNOSIS — H524 Presbyopia: Secondary | ICD-10-CM | POA: Diagnosis not present

## 2024-08-21 DIAGNOSIS — H52223 Regular astigmatism, bilateral: Secondary | ICD-10-CM | POA: Diagnosis not present

## 2024-08-21 DIAGNOSIS — H43813 Vitreous degeneration, bilateral: Secondary | ICD-10-CM | POA: Diagnosis not present

## 2024-08-21 DIAGNOSIS — H02882 Meibomian gland dysfunction right lower eyelid: Secondary | ICD-10-CM | POA: Diagnosis not present

## 2024-08-21 DIAGNOSIS — H05243 Constant exophthalmos, bilateral: Secondary | ICD-10-CM | POA: Diagnosis not present

## 2024-08-21 DIAGNOSIS — H5203 Hypermetropia, bilateral: Secondary | ICD-10-CM | POA: Diagnosis not present

## 2024-08-21 DIAGNOSIS — H02885 Meibomian gland dysfunction left lower eyelid: Secondary | ICD-10-CM | POA: Diagnosis not present

## 2024-08-22 ENCOUNTER — Other Ambulatory Visit (INDEPENDENT_AMBULATORY_CARE_PROVIDER_SITE_OTHER)

## 2024-08-22 ENCOUNTER — Encounter: Payer: Self-pay | Admitting: Orthopaedic Surgery

## 2024-08-22 ENCOUNTER — Ambulatory Visit: Admitting: Orthopaedic Surgery

## 2024-08-22 VITALS — Ht 67.0 in | Wt 242.0 lb

## 2024-08-22 DIAGNOSIS — Z96642 Presence of left artificial hip joint: Secondary | ICD-10-CM | POA: Diagnosis not present

## 2024-08-22 NOTE — Progress Notes (Signed)
 The patient is a 68 year old female that I am seeing for second opinion as a relates to left hip pain.  She has a history of a left total hip arthroplasty that was done in 2022 at [facility].  Earlier this year she had a trochanteric bursectomy and release of the iliotibial band and arthroscopically over in [place] by Dr. Tobie.  She still has some pain on the lateral aspect of her left hip but most of her pain she points to is in the sciatic region on the left side.  She did have MRI of her lumbar spine in 2024 and she does see a pain specialist.  She said they have recommended injections but she does not think that that will help her in the last long enough.  She denies any groin pain at all.  On my exam today I can move her left hip fluidly and smoothly with no pain or blocks or rotation at all.  She has just a little bit of mild pain over the trochanteric area.  Most of her pain seems to be low over the SI joint and mainly the sciatic region and the ischium on the left side.  An AP pelvis and lateral left hip shows a well-seated left total hip arthroplasty with no complicating features.  She will remain in reassurance that her left hip replacement looks good.  I do not believe this is source of her pain and the x-rays actually show well-seated implant with normal offset and good placement of the components.  I showed her the x-rays as well.  I do feel that this is more of a sciatic issue she should consider some type of L4-L5 epidural steroid injection at some point.  She said she would rather not have something like that as of now.  All question concerns were answered addressed.  Follow-up is as needed.

## 2024-08-23 ENCOUNTER — Other Ambulatory Visit: Payer: Self-pay | Admitting: Gastroenterology

## 2024-08-23 ENCOUNTER — Ambulatory Visit: Payer: Self-pay

## 2024-08-23 ENCOUNTER — Encounter: Payer: Self-pay | Admitting: Internal Medicine

## 2024-08-23 DIAGNOSIS — R1312 Dysphagia, oropharyngeal phase: Secondary | ICD-10-CM

## 2024-08-23 NOTE — Telephone Encounter (Signed)
  FYI Only or Action Required?: Action required by provider: was seen last week and given medication for cough, would like an antibiotic called in for cough.  States tested positive for covid today.  Patient was last seen in primary care on 08/13/2024 by Bernardo Fend, DO.  Called Nurse Triage reporting Covid Positive.  Symptoms began a week ago.  Interventions attempted: Nothing.  Symptoms are: unchanged.  Triage Disposition: Home Care  Patient/caregiver understands and will follow disposition?: Yes      Pt tested positive for covid and would like some advice and also to see if any antibiotics can be prescribed.  Reason for Disposition  [1] COVID-19 diagnosed by positive lab test (e.g., PCR, rapid self-test kit) AND [2] mild symptoms (e.g., cough, fever, others) AND [3] no complications or SOB  Answer Assessment - Initial Assessment Questions 1. SYMPTOMS: What is your main symptom or concern? (e.g., cough, fever, shortness of breath, muscle aches)     Cough, runny nose 2. ONSET: When did the symptoms start?      Tested positive today and medication for a cough 3. COUGH: Do you have a cough? If Yes, ask: How bad is the cough?       Yes, clear thick sputum 4. FEVER: Do you have a fever? If Yes, ask: What is your temperature, how was it measured, and when did it start?     denies 5. BREATHING DIFFICULTY: Are you having any difficulty breathing? (e.g., normal; shortness of breath, wheezing, unable to speak)      Not really 6. BETTER-SAME-WORSE: Are you getting better, staying the same or getting worse compared to yesterday?  If getting worse, ask, In what way?     Slightly better, states not as bad as she was 7. OTHER SYMPTOMS: Do you have any other symptoms?  (e.g., chills, fatigue, headache, loss of smell or taste, muscle pain, sore throat)     Appetite is poor but is on ozempic  8. COVID-19 DIAGNOSIS: How do you know that you have COVID? (e.g.,  positive lab test or self-test, diagnosed by doctor or NP/PA, symptoms after exposure).     Home test today 9. COVID-19 EXPOSURE: Was there any known exposure to COVID before the symptoms began?      yesterday 10. COVID-19 VACCINE: Have you had the COVID-19 vaccine? If Yes, ask: When did you last get it?       2 years ago 11. HIGH RISK DISEASE: Do you have any chronic medical problems? (e.g., asthma, heart or lung disease, weak immune system, obesity, etc.)       Heart disease 12. PREGNANCY: Is there any chance you are pregnant? When was your last menstrual period?       na 13. O2 SATURATION MONITOR:  Do you use an oxygen saturation monitor (pulse oximeter) at home? If Yes, ask What is your reading (oxygen level) today? What is your usual oxygen saturation reading? (e.g., 95%)       no  Protocols used: COVID-19 - Diagnosed or Suspected-A-AH

## 2024-08-24 NOTE — Telephone Encounter (Signed)
 Spoke to patient she stated she is feeling much better

## 2024-08-27 NOTE — Telephone Encounter (Signed)
Spoke to patient on 10/3

## 2024-08-31 ENCOUNTER — Ambulatory Visit: Admitting: Internal Medicine

## 2024-08-31 ENCOUNTER — Inpatient Hospital Stay (HOSPITAL_BASED_OUTPATIENT_CLINIC_OR_DEPARTMENT_OTHER): Payer: Self-pay | Admitting: Nurse Practitioner

## 2024-08-31 ENCOUNTER — Other Ambulatory Visit

## 2024-08-31 ENCOUNTER — Ambulatory Visit

## 2024-08-31 ENCOUNTER — Encounter: Payer: Self-pay | Admitting: Nurse Practitioner

## 2024-08-31 ENCOUNTER — Inpatient Hospital Stay: Payer: Self-pay

## 2024-08-31 ENCOUNTER — Inpatient Hospital Stay: Payer: Self-pay | Attending: Internal Medicine

## 2024-08-31 DIAGNOSIS — Z79899 Other long term (current) drug therapy: Secondary | ICD-10-CM | POA: Diagnosis not present

## 2024-08-31 DIAGNOSIS — N183 Chronic kidney disease, stage 3 unspecified: Secondary | ICD-10-CM

## 2024-08-31 DIAGNOSIS — I129 Hypertensive chronic kidney disease with stage 1 through stage 4 chronic kidney disease, or unspecified chronic kidney disease: Secondary | ICD-10-CM | POA: Insufficient documentation

## 2024-08-31 DIAGNOSIS — D631 Anemia in chronic kidney disease: Secondary | ICD-10-CM | POA: Insufficient documentation

## 2024-08-31 DIAGNOSIS — Z7984 Long term (current) use of oral hypoglycemic drugs: Secondary | ICD-10-CM | POA: Diagnosis not present

## 2024-08-31 DIAGNOSIS — Z794 Long term (current) use of insulin: Secondary | ICD-10-CM | POA: Diagnosis not present

## 2024-08-31 DIAGNOSIS — N184 Chronic kidney disease, stage 4 (severe): Secondary | ICD-10-CM | POA: Insufficient documentation

## 2024-08-31 DIAGNOSIS — E1122 Type 2 diabetes mellitus with diabetic chronic kidney disease: Secondary | ICD-10-CM | POA: Diagnosis present

## 2024-08-31 DIAGNOSIS — Z7985 Long-term (current) use of injectable non-insulin antidiabetic drugs: Secondary | ICD-10-CM | POA: Insufficient documentation

## 2024-08-31 DIAGNOSIS — Z85850 Personal history of malignant neoplasm of thyroid: Secondary | ICD-10-CM | POA: Insufficient documentation

## 2024-08-31 LAB — CBC WITH DIFFERENTIAL (CANCER CENTER ONLY)
Abs Immature Granulocytes: 0.03 K/uL (ref 0.00–0.07)
Basophils Absolute: 0 K/uL (ref 0.0–0.1)
Basophils Relative: 1 %
Eosinophils Absolute: 0.2 K/uL (ref 0.0–0.5)
Eosinophils Relative: 3 %
HCT: 36.3 % (ref 36.0–46.0)
Hemoglobin: 11.5 g/dL — ABNORMAL LOW (ref 12.0–15.0)
Immature Granulocytes: 0 %
Lymphocytes Relative: 35 %
Lymphs Abs: 2.6 K/uL (ref 0.7–4.0)
MCH: 27.1 pg (ref 26.0–34.0)
MCHC: 31.7 g/dL (ref 30.0–36.0)
MCV: 85.6 fL (ref 80.0–100.0)
Monocytes Absolute: 0.4 K/uL (ref 0.1–1.0)
Monocytes Relative: 6 %
Neutro Abs: 4.2 K/uL (ref 1.7–7.7)
Neutrophils Relative %: 55 %
Platelet Count: 325 K/uL (ref 150–400)
RBC: 4.24 MIL/uL (ref 3.87–5.11)
RDW: 13.9 % (ref 11.5–15.5)
WBC Count: 7.6 K/uL (ref 4.0–10.5)
nRBC: 0 % (ref 0.0–0.2)

## 2024-08-31 LAB — BASIC METABOLIC PANEL WITH GFR
Anion gap: 10 (ref 5–15)
BUN: 26 mg/dL — ABNORMAL HIGH (ref 8–23)
CO2: 24 mmol/L (ref 22–32)
Calcium: 8.7 mg/dL — ABNORMAL LOW (ref 8.9–10.3)
Chloride: 101 mmol/L (ref 98–111)
Creatinine, Ser: 1.53 mg/dL — ABNORMAL HIGH (ref 0.44–1.00)
GFR, Estimated: 36 mL/min — ABNORMAL LOW (ref >60)
Glucose, Bld: 145 mg/dL — ABNORMAL HIGH (ref 70–99)
Potassium: 3.9 mmol/L (ref 3.5–5.1)
Sodium: 135 mmol/L (ref 135–145)

## 2024-08-31 LAB — IRON AND TIBC
Iron: 64 ug/dL (ref 28–170)
Saturation Ratios: 22 % (ref 10.4–31.8)
TIBC: 295 ug/dL (ref 250–450)
UIBC: 231 ug/dL

## 2024-08-31 LAB — FERRITIN: Ferritin: 73 ng/mL (ref 11–307)

## 2024-08-31 NOTE — Progress Notes (Signed)
 08/22/24 hip xray, 05/21/24 swallow test and us  abdomen.

## 2024-08-31 NOTE — Progress Notes (Signed)
 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 [Dr.Balch; elevated ESR]; June 2020 repeat M protein-negative  #Anemia hemoglobin 9.9/CKD stage III  #  Anterior mesenteric nodule- 16x69mm [sep 2016; smaller; Feb 2016- S/p Bx- leiomyoma.]; Ilecolic LN [81mm; sep 2016; improving].2016- Octreotide  scan- NEG;  CT SEP 2017- NED [elevated chromogranin  Levels- slightly]; July 2019- CT STABLE; Oct 2019- [facility]- imaging reviewed; No further surveillaince  # CKD stage III/diabetes; hemoglobin 10 [EGD-2016; colo-2018-Dr.Elliot]  #April 2021-total thyroidectomy; [Dr.Cannon] Tumor Size: 0.8 cm in greatest dimension Histologic Type: Papillary carcinoma, classic (usual, conventional); stage I; no adjuvant therapy  # Retro-tracheal duplication cyst   Thyroid  cancer (HCC) (Resolved)  03/28/2020 Initial Diagnosis   Thyroid  cancer (HCC)     INTERVAL HISTORY: Ambulating independently.  Alone.  Patient is a 68 year old female with above history of iron  deficiency and anemia secondary to chronic kidney disease, who returns to clinic for routine follow-up. Denies any neurologic complaints. Denies recent fevers or illnesses. Denies any easy bleeding or bruising. No melena or hematochezia. No pica or restless leg. Reports good appetite and denies weight loss. Denies chest pain. Denies any nausea, vomiting, constipation, or diarrhea. Denies urinary complaints. Patient offers no further specific complaints today.  She has not required IV iron  in several years and prefers to avoid if at all possible.  Review of Systems  Constitutional:  Negative for chills, diaphoresis, fever, malaise/fatigue and weight loss.  HENT:  Negative for  nosebleeds and sore throat.   Eyes:  Negative for double vision.  Respiratory:  Negative for cough, hemoptysis, sputum production, shortness of breath and wheezing.   Cardiovascular:  Negative for chest pain, palpitations, orthopnea and leg swelling.  Gastrointestinal:  Negative for abdominal pain, blood in stool, constipation, diarrhea, heartburn, melena, nausea and vomiting.  Genitourinary:  Negative for dysuria, frequency, hematuria and urgency.  Musculoskeletal:  Positive for joint pain. Negative for back pain.  Skin:  Negative for itching and rash.  Neurological:  Negative for dizziness, tingling, focal weakness, weakness and headaches.  Endo/Heme/Allergies:  Does not bruise/bleed easily.  Psychiatric/Behavioral:  Negative for depression. The patient is not nervous/anxious and does not have insomnia.    PAST MEDICAL HISTORY :  Past Medical History:  Diagnosis Date   Anemia of chronic kidney failure, stage 3 (moderate) (HCC) 08/17/2019   Anemia of chronic renal failure    Aortic atherosclerosis    Arthritis    Bradycardia    Cataract    CKD (chronic kidney disease), stage III (HCC)    Colon polyps    Coronary artery disease 05/03/2001   a.) LHC 05/03/2001: 95% pLCx, 95% mLAD-1, 75% mLAD-2, 95% D2 --> LAD dissection occurred during procedure --> CVTS consult; b.) s/p 3v CABG 05/03/2001: LIMA-LAD,SVG-OM2, SVG-D2; c.) LHC/PCI 02/12/2004: 25% pRCA, 25% mRCA, 95% RPDA, 25% LM, 25% pLCx, 95% mLCx (3.0 x 13 mm Cypher), 50% OM3, 25% pLAD, 100% mLAD, 25% D2, 25% LIMA-LAD, 100% SVG-OM2, 50% SVG-D2   Coronary artery dissection 05/03/2001   a.) LAD dissection occurred during diagnostic PCI at [facility]; IABP placed and patient sent for emergency CABG   Diabetic neuropathy (HCC)    Dysphagia    Exertional dyspnea    Gastritis    GERD (gastroesophageal reflux disease)  Headache    History of hiatal hernia    History of shingles    HLD (hyperlipidemia)    Hypertension    IBS (irritable bowel  syndrome)    IDA (iron  deficiency anemia)    a.) followed by hematology; receiving iron  infusions PRN   Long-term use of aspirin  therapy    Mesenteric mass    Muscle tension dysphonia    PAF (paroxysmal atrial fibrillation) (HCC)    a.) CHA2DS2VASc = 5 (age, sex, HTN, vascular disease, T2DM) as of 02/06/2024; b.) rate/rhythm maintained on oral metoprolol succinate; no chronic OAC   Peripheral vascular disease    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   a.) s/p total thyroidectomy for thyroid  cancer   Right BBB/left ant fasc block    Rotator cuff tendinitis, left    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   a.) SVG-OM2, SVG-D2, LIMA-LAD   SVT (supraventricular tachycardia)    T2DM (type 2 diabetes mellitus) (HCC)    Thyroid  cancer (HCC)    a.) s/p total thyroidectomy 03/14/2020   Trochanteric bursitis of left hip    Vitamin D  deficiency    PAST SURGICAL HISTORY :   Past Surgical History:  Procedure Laterality Date   ABDOMINAL HYSTERECTOMY     CHOLECYSTECTOMY     COLONOSCOPY WITH PROPOFOL  N/A 12/20/2016   Procedure: COLONOSCOPY WITH PROPOFOL ;  Surgeon: Lamar ONEIDA Holmes, MD;  Location: [facility] ENDOSCOPY;  Service: Endoscopy;  Laterality: N/A;   COLONOSCOPY WITH PROPOFOL  N/A 01/19/2022   Procedure: COLONOSCOPY WITH PROPOFOL ;  Surgeon: Maryruth Ole ONEIDA, MD;  Location: [facility] ENDOSCOPY;  Service: Endoscopy;  Laterality: N/A;   CORONARY ANGIOPLASTY WITH STENT PLACEMENT Left 02/12/2004   Procedure: CORONARY ANGIOPLASTY WITH STENT PLACEMENT; Location: [facility]; Surgeon: Miquel Ellen, MD   CORONARY ARTERY BYPASS GRAFT N/A 05/03/2001   Procedure: CORONARY ARTERY BYPASS GRAFT; Location: [facility]; Surgeon: Bernardino Lares, MD   ESOPHAGOGASTRODUODENOSCOPY N/A 03/15/2023   Procedure: ESOPHAGOGASTRODUODENOSCOPY (EGD);  Surgeon: Maryruth Ole ONEIDA, MD;  Location: [facility] ENDOSCOPY;  Service: Endoscopy;  Laterality: N/A;   ESOPHAGOGASTRODUODENOSCOPY (EGD) WITH PROPOFOL  N/A 04/27/2017   Procedure: ESOPHAGOGASTRODUODENOSCOPY  (EGD) WITH PROPOFOL ;  Surgeon: Holmes Lamar ONEIDA, MD;  Location: [facility] ENDOSCOPY;  Service: Endoscopy;  Laterality: N/A;   EUS N/A 04/24/2015   Procedure: ESOPHAGEAL ENDOSCOPIC ULTRASOUND (EUS) RADIAL;  Surgeon: Ozell Eva Montes, MD;  Location: [facility] ENDOSCOPY;  Service: Endoscopy;  Laterality: N/A;   LEFT HEART CATH AND CORONARY ANGIOGRAPHY Left 05/03/2001   Procedure: LEFT HEART CATH AND CORONARY ANGIOGRAPHY; Location: [facility]; Surgeon: Ozell Estelle, MD   TOTAL HIP ARTHROPLASTY Left 02/24/2021   Procedure: TOTAL HIP ARTHROPLASTY ANTERIOR APPROACH;  Surgeon: Kathlynn Ozell, MD;  Location: [facility] ORS;  Service: Orthopedics;  Laterality: Left;   TOTAL THYROIDECTOMY N/A 03/14/2020   Procedure: TOTAL THYROIDECTOMY; Location: [facility]   FAMILY HISTORY :   Family History  Problem Relation Age of Onset   Arthritis Sister    Heart disease Sister    Arthritis Sister    Arthritis Sister    Breast cancer Neg Hx    SOCIAL HISTORY:   Social History   Tobacco Use   Smoking status: Never   Smokeless tobacco: Never  Vaping Use   Vaping status: Never Used  Substance Use Topics   Alcohol use: No   Drug use: No   ALLERGIES:  is allergic to iodinated contrast media, oxycodone -acetaminophen , and etodolac.  MEDICATIONS:  Current Outpatient Medications  Medication Sig Dispense Refill   acetaminophen  (TYLENOL ) 500 MG tablet Take 2 tablets (1,000 mg total) by  mouth every 8 (eight) hours. 90 tablet 2   amLODipine  (NORVASC ) 5 MG tablet Take 2.5 mg by mouth in the morning.     atorvastatin  (LIPITOR) 40 MG tablet Take 1 tablet (40 mg total) by mouth daily at 12 noon. 90 tablet 1   Cholecalciferol 25 MCG (1000 UT) tablet Take 1 tablet by mouth daily at 12 noon.     dicyclomine  (BENTYL ) 20 MG tablet Take 20 mg by mouth 2 (two) times daily as needed for spasms.      fexofenadine  (ALLEGRA  ALLERGY) 180 MG tablet Take 1 tablet (180 mg total) by mouth daily. 90 tablet 1   FIASP  100 UNIT/ML SOLN Inject 8-16 Units  into the skin See admin instructions. Inject 12 units subcutaneously in the morning, inject 16 units subcutaneously with lunch & inject 8 units subcutaneously with supper.     fluticasone  (FLONASE ) 50 MCG/ACT nasal spray Place 1 spray into both nostrils daily as needed for allergies.     gabapentin  (NEURONTIN ) 300 MG capsule TAKE 1 CAPSULE BY MOUTH DAILY 100 capsule 1   insulin  glargine (LANTUS  SOLOSTAR) 100 UNIT/ML Solostar Pen Inject 28 Units into the skin at bedtime.     Iron -Vitamin C  (VITRON-C) 65-125 MG TABS Take 1 tablet by mouth every evening.     levothyroxine  (SYNTHROID ) 137 MCG tablet Take 137 mcg by mouth daily before breakfast.     linaclotide (LINZESS) 72 MCG capsule Take 72 mcg by mouth daily as needed (constipation.).     Magnesium  400 MG CAPS Take 400 mg by mouth in the morning.     Menthol -Methyl Salicylate (SALONPAS PAIN RELIEF PATCH EX) Place 1 patch onto the skin daily as needed (pain.).     Menthol , Topical Analgesic, (ICY HOT EX) Apply 1 Application topically as needed (pain.).     metFORMIN  (GLUCOPHAGE ) 500 MG tablet Take 500-1,000 mg by mouth See admin instructions. Take 1 tablet (500 mg) by mouth in the morning & take 2 tablets (1000 mg) by mouth at night.     methocarbamol  (ROBAXIN ) 500 MG tablet Take 1 tablet (500 mg total) by mouth at bedtime as needed for muscle spasms. 30 tablet 2   metoprolol succinate (TOPROL-XL) 25 MG 24 hr tablet Take 25 mg by mouth at bedtime.     Multiple Vitamin (MULTIVITAMIN WITH MINERALS) TABS tablet Take 1 tablet by mouth daily.     nystatin  (MYCOSTATIN /NYSTOP ) powder Apply 1 Application topically 3 (three) times daily. 60 g 0   omeprazole (PRILOSEC) 40 MG capsule Take 40 mg by mouth in the morning.     Semaglutide,0.25 or 0.5MG /DOS, 2 MG/3ML SOPN Inject 1 mg into the skin once a week.     spironolactone  (ALDACTONE ) 50 MG tablet Take 50 mg by mouth at bedtime.     telmisartan (MICARDIS) 80 MG tablet Take 80 mg by mouth at bedtime.      torsemide (DEMADEX) 10 MG tablet Take 10 mg by mouth in the morning.     No current facility-administered medications for this visit.    PHYSICAL EXAMINATION: ECOG PERFORMANCE STATUS: 0 - Asymptomatic  BP (!) 141/70 (BP Location: Right Arm, Patient Position: Sitting, Cuff Size: Large)   Pulse 83   Temp 97.6 F (36.4 C) (Tympanic)   Resp 16   Ht 5' 7 (1.702 m)   Wt 234 lb (106.1 kg)   SpO2 100%   BMI 36.65 kg/m   Filed Weights   08/31/24 1359  Weight: 234 lb (106.1 kg)   Physical  Exam Constitutional:      Appearance: She is not ill-appearing.  Eyes:     General: No scleral icterus.    Conjunctiva/sclera: Conjunctivae normal.  Cardiovascular:     Rate and Rhythm: Normal rate and regular rhythm.  Abdominal:     General: There is no distension.     Palpations: Abdomen is soft.     Tenderness: There is no abdominal tenderness. There is no guarding.  Musculoskeletal:        General: No deformity.     Right lower leg: No edema.     Left lower leg: No edema.  Lymphadenopathy:     Cervical: No cervical adenopathy.  Skin:    General: Skin is warm and dry.  Neurological:     Mental Status: She is alert and oriented to person, place, and time.  Psychiatric:        Mood and Affect: Mood normal.        Behavior: Behavior normal.    LABORATORY DATA:  I have reviewed the data as listed    Component Value Date/Time   NA 141 03/02/2024 1304   NA 136 03/10/2015 1640   K 3.9 03/02/2024 1304   K 2.7 (L) 03/10/2015 1640   CL 105 03/02/2024 1304   CL 96 (L) 03/10/2015 1640   CO2 24 03/02/2024 1304   CO2 31 03/10/2015 1640   GLUCOSE 86 03/02/2024 1304   GLUCOSE 99 03/10/2015 1640   BUN 30 (H) 03/02/2024 1304   BUN 16 03/10/2015 1640   CREATININE 1.84 (H) 03/02/2024 1304   CREATININE 1.98 (H) 06/07/2023 1123   CALCIUM  8.7 (L) 03/02/2024 1304   CALCIUM  9.0 03/10/2015 1640   PROT 7.4 03/02/2024 1304   PROT 7.7 03/10/2015 1640   ALBUMIN 4.0 03/02/2024 1304   ALBUMIN 4.1  03/10/2015 1640   AST 29 03/02/2024 1304   ALT 26 03/02/2024 1304   ALT 39 03/10/2015 1640   ALKPHOS 80 03/02/2024 1304   ALKPHOS 60 03/10/2015 1640   BILITOT 0.6 03/02/2024 1304   GFRNONAA 29 (L) 03/02/2024 1304   GFRNONAA 50 (L) 03/10/2015 1640   GFRAA 39 (L) 07/25/2020 1252   GFRAA 58 (L) 03/10/2015 1640   Lab Results  Component Value Date   WBC 6.8 03/02/2024   NEUTROABS 3.9 03/02/2024   HGB 10.7 (L) 03/02/2024   HCT 34.9 (L) 03/02/2024   MCV 89.5 03/02/2024   PLT 290 03/02/2024     Chemistry      Component Value Date/Time   NA 141 03/02/2024 1304   NA 136 03/10/2015 1640   K 3.9 03/02/2024 1304   K 2.7 (L) 03/10/2015 1640   CL 105 03/02/2024 1304   CL 96 (L) 03/10/2015 1640   CO2 24 03/02/2024 1304   CO2 31 03/10/2015 1640   BUN 30 (H) 03/02/2024 1304   BUN 16 03/10/2015 1640   CREATININE 1.84 (H) 03/02/2024 1304   CREATININE 1.98 (H) 06/07/2023 1123      Component Value Date/Time   CALCIUM  8.7 (L) 03/02/2024 1304   CALCIUM  9.0 03/10/2015 1640   ALKPHOS 80 03/02/2024 1304   ALKPHOS 60 03/10/2015 1640   AST 29 03/02/2024 1304   ALT 26 03/02/2024 1304   ALT 39 03/10/2015 1640   BILITOT 0.6 03/02/2024 1304     Iron /TIBC/Ferritin/ %Sat    Component Value Date/Time   IRON  57 03/02/2024 1304   TIBC 302 03/02/2024 1304   FERRITIN 77 03/02/2024 1304   IRONPCTSAT 19 03/02/2024  1304     ASSESSMENT & PLAN:   Anemia of chronic kidney failure, stage 3 (moderate)  # Anemia of chronic kidney disease, stage 3-she has not yet required erythropoietin stimulating agent.  We again reviewed the role of optimizing her iron  stores in the setting of CKD.  She last received IV Venofer  in May 2021.  Hemoglobin today is stable at 11.5.  She continues Vitron-C, 2 pills at bedtime.  Tolerating well.  Last colonoscopy in February 2023.  Clinically she is asymptomatic of blood loss.  Prefers to hold off on additional Venofer  at this time.  Ferritin and iron  studies are still  pending.  Would consider starting ESA if hemoglobin is persistently around 9.   #CKD stage IV- III [GFR 32] no NSAIDs.[ Lasix  q OD; [facility] neph[facility]]- stable.   #S/p thyroidectomy thyroid  Tumor Size: 0.8 cm in greatest dimension Histologic Type: Papillary carcinoma, classic (usual, conventional).  No role for any adjuvant therapy; followed by endocrinology- stable.    # IV access: Poor/hydration   DISPOSITION:  No Venofer  today 6 mo- lab (cbc, bmp, ferritin, iron  studies), Dr Rennie, +/- venofer - la  No problem-specific Assessment & Plan notes found for this encounter.  Tinnie KANDICE Dawn, NP 08/31/2024

## 2024-09-07 ENCOUNTER — Other Ambulatory Visit

## 2024-09-10 ENCOUNTER — Ambulatory Visit

## 2024-09-12 ENCOUNTER — Telehealth: Payer: Self-pay

## 2024-09-12 ENCOUNTER — Ambulatory Visit
Admission: RE | Admit: 2024-09-12 | Discharge: 2024-09-12 | Disposition: A | Discharge status: Home / Self Care | Source: Ambulatory Visit | Attending: Gastroenterology | Admitting: Gastroenterology

## 2024-09-12 DIAGNOSIS — R1312 Dysphagia, oropharyngeal phase: Secondary | ICD-10-CM | POA: Insufficient documentation

## 2024-09-12 NOTE — Telephone Encounter (Signed)
 Copied from CRM [id number]. Topic: Appointments - Appointment Info/Confirmation >> Sep 12, 2024 12:33 PM Pinkey ORN wrote: Patient/patient representative is calling for information regarding an appointment. >> Sep 12, 2024 12:34 PM Pinkey ORN wrote: Patient is wanting to know what is the number she can expect a call from tomorrow in regards to her annual wellness telephone appointment. Please follow up with patient.

## 2024-09-12 NOTE — Procedures (Signed)
 Modified Barium Swallow Study  Patient Details  Name: Hailey Howard MRN: [id number] Date of Birth: [birth year]  Today's Date: 09/12/2024  Modified Barium Swallow completed.  Full report located under Chart Review in the Imaging Section.  History of Present Illness Pt is a 68 year old female who was referred by her GI, Christiane London, d/t dysphagia with solids and liquids. Pt also reports history of thyroid  removal with hoarse vocal quality immediately following (2021). Barium Swallow on 05/21/2024 revealed Small sliding-type hiatal hernia.     Spontaneous large volume gastroesophageal reflux to the upper third  of the esophagus.     No mass, lesions or significant dysmotility.  . 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   Clinical Impression She reports having seen an ENT with recommendation for ST services. Education provided by this Clinical research associate on continued recommendation for evaluation by ENT d/t hoarse, breathy, strained vocal quality during this assessment.  During this study, pt presents with adequate oropharyngeal abilities when consuming thin liquids, nectar thick liquids, puree, graham cracker with barium paste and whole barium tablet with thin liquids. Pt's oral phase was timely with reduced residue and her pharyngeal response was complete with no penetration or aspiration observed. Pt reported globus sensation x 2 that correlated to some retrograde movement observed during esophageal sweep. Bolus didn't come up into the upper esophagus region but please see radiologist note for more details. At this time, skilled ST services are not indicated d/t adequate oropharyngeal abilities.    Factors that may increase risk of adverse event in presence of aspiration [name] & [name] 2021):  N/A  Swallow Evaluation Recommendations Recommendations: PO diet PO Diet Recommendation: Regular;Thin liquids (Level 0) Liquid Administration via: Cup;Straw Medication Administration:  Whole meds with liquid Supervision: Patient able to self-feed Swallowing strategies  : Minimize environmental distractions;Slow rate;Small bites/sips Postural changes: Position pt fully upright for meals;Stay upright 30-60 min after meals Oral care recommendations: Oral care BID (2x/day) Recommended consults: Consider ENT consultation    [name] B. Rubbie, M.S., CCC-SLP, Tree surgeon Certified Brain Injury Specialist [facility]  [facility] Rehabilitation Services Office [phone] Ascom [phone] Fax [phone]

## 2024-09-13 ENCOUNTER — Ambulatory Visit: Payer: Medicare PPO

## 2024-09-13 DIAGNOSIS — Z0000 Encounter for general adult medical examination without abnormal findings: Secondary | ICD-10-CM

## 2024-09-13 NOTE — Patient Instructions (Addendum)
 [name],  Thank you for taking the time for your Medicare Wellness Visit. I appreciate your continued commitment to your health goals. Please review the care plan we discussed, and feel free to reach out if I can assist you further.  Medicare recommends these wellness visits once per year to help you and your care team stay ahead of potential health issues. These visits are designed to focus on prevention, allowing your provider to concentrate on managing your acute and chronic conditions during your regular appointments.  Please note that Annual Wellness Visits do not include a physical exam. Some assessments may be limited, especially if the visit was conducted virtually. If needed, we may recommend a separate in-person follow-up with your provider.  Ongoing Care Seeing your primary care provider every 3 to 6 months helps us  monitor your health and provide consistent, personalized care.   Referrals If a referral was made during today's visit and you haven't received any updates within two weeks, please contact the referred provider directly to check on the status.  Recommended Screenings:  Health Maintenance  Topic Date Due   COVID-19 Vaccine (3 - Moderna risk series) 03/24/2020   Complete foot exam   02/03/2024   Yearly kidney health urinalysis for diabetes  08/09/2024   Hemoglobin A1C  09/14/2024   Breast Cancer Screening  01/01/2025   Eye exam for diabetics  02/15/2025   Yearly kidney function blood test for diabetes  08/31/2025   Medicare Annual Wellness Visit  09/13/2025   DEXA scan (bone density measurement)  12/03/2026   Colon Cancer Screening  01/19/2027   Pneumococcal Vaccine for age over 39  Completed   Hepatitis C Screening  Completed   Zoster (Shingles) Vaccine  Completed   Meningitis B Vaccine  Aged Out   DTaP/Tdap/Td vaccine  Discontinued       09/13/2024    2:04 PM  Advanced Directives  Does Patient Have a Medical Advance Directive? Yes  Type of Sports coach of [place];Living will  Does patient want to make changes to medical advance directive? No - Patient declined  Copy of Healthcare Power of Attorney in Chart? Yes - validated most recent copy scanned in chart (See row information)   Advance Care Planning is important because it: Ensures you receive medical care that aligns with your values, goals, and preferences. Provides guidance to your family and loved ones, reducing the emotional burden of decision-making during critical moments.  Vision: Annual vision screenings are recommended for early detection of glaucoma, cataracts, and diabetic retinopathy. These exams can also reveal signs of chronic conditions such as diabetes and high blood pressure.  Dental: Annual dental screenings help detect early signs of oral cancer, gum disease, and other conditions linked to overall health, including heart disease and diabetes.  Please see the attached documents for additional preventive care recommendations.   NEXT AWV  09/19/25 @ 2:00 PM BY VIDEO

## 2024-09-13 NOTE — Progress Notes (Signed)
 Subjective:   Hailey Howard is a 68 y.o. who presents for a Medicare Wellness preventive visit.  As a reminder, Annual Wellness Visits don't include a physical exam, and some assessments may be limited, especially if this visit is performed virtually. We may recommend an in-person follow-up visit with your provider if needed.  Visit Complete: Virtual I connected with  Hailey Howard on 09/13/24 by a audio enabled telemedicine application and verified that I am speaking with the correct person using two identifiers.  Patient Location: Home  Provider Location: Home Office  I discussed the limitations of evaluation and management by telemedicine. The patient expressed understanding and agreed to proceed.  Vital Signs: Because this visit was a virtual/telehealth visit, some criteria may be missing or patient reported. Any vitals not documented were not able to be obtained and vitals that have been documented are patient reported.  VideoDeclined- This patient declined Librarian, academic. Therefore the visit was completed with audio only.  Persons Participating in Visit: Patient.  AWV Questionnaire: No: Patient Medicare AWV questionnaire was not completed prior to this visit.  Cardiac Risk Factors include: advanced age (>62men, >55 women);diabetes mellitus;dyslipidemia;hypertension;obesity (BMI >30kg/m2)     Objective:    Today's Vitals   09/13/24 1358  PainSc: 0-No pain   There is no height or weight on file to calculate BMI.     09/13/2024    2:04 PM 08/31/2024    2:00 PM 01/30/2024    3:39 PM 10/05/2023    1:27 PM 09/12/2023    8:04 AM 09/05/2023    8:00 AM 09/02/2023    1:19 PM  Advanced Directives  Does Patient Have a Medical Advance Directive? Yes Yes Yes Yes Yes Yes Yes  Type of Estate agent of [place];Living will Healthcare Power of [place];Living will Healthcare Power of [place];Living will  Healthcare Power  of [place];Living will  Living will;Healthcare Power of Attorney  Does patient want to make changes to medical advance directive? No - Patient declined No - Patient declined No - Patient declined   No - Patient declined   Copy of Healthcare Power of Attorney in Chart? Yes - validated most recent copy scanned in chart (See row information) Yes - validated most recent copy scanned in chart (See row information) No - copy requested        Current Medications (verified) Outpatient Encounter Medications as of 09/13/2024  Medication Sig   acetaminophen  (TYLENOL ) 500 MG tablet Take 2 tablets (1,000 mg total) by mouth every 8 (eight) hours.   amLODipine  (NORVASC ) 5 MG tablet Take 2.5 mg by mouth in the morning.   atorvastatin  (LIPITOR) 40 MG tablet Take 1 tablet (40 mg total) by mouth daily at 12 noon.   Cholecalciferol 25 MCG (1000 UT) tablet Take 1 tablet by mouth daily at 12 noon.   dicyclomine  (BENTYL ) 20 MG tablet Take 20 mg by mouth 2 (two) times daily as needed for spasms.    fexofenadine  (ALLEGRA  ALLERGY) 180 MG tablet Take 1 tablet (180 mg total) by mouth daily. (Patient taking differently: Take 180 mg by mouth daily. TAKES PRN)   FIASP  100 UNIT/ML SOLN Inject 8-16 Units into the skin See admin instructions. Inject 12 units subcutaneously in the morning, inject 16 units subcutaneously with lunch & inject 8 units subcutaneously with supper.   fluticasone  (FLONASE ) 50 MCG/ACT nasal spray Place 1 spray into both nostrils daily as needed for allergies.   gabapentin  (NEURONTIN ) 300 MG capsule TAKE  1 CAPSULE BY MOUTH DAILY   insulin  glargine (LANTUS  SOLOSTAR) 100 UNIT/ML Solostar Pen Inject 28 Units into the skin at bedtime.   Iron -Vitamin C  (VITRON-C) 65-125 MG TABS Take 1 tablet by mouth every evening.   levothyroxine  (SYNTHROID ) 137 MCG tablet Take 137 mcg by mouth daily before breakfast.   linaclotide (LINZESS) 72 MCG capsule Take 72 mcg by mouth daily as needed (constipation.).   Magnesium  400  MG CAPS Take 400 mg by mouth in the morning.   Menthol , Topical Analgesic, (ICY HOT EX) Apply 1 Application topically as needed (pain.).   Menthol -Methyl Salicylate (SALONPAS PAIN RELIEF PATCH EX) Place 1 patch onto the skin daily as needed (pain.).   metFORMIN  (GLUCOPHAGE ) 500 MG tablet Take 500-1,000 mg by mouth See admin instructions. Take 1 tablet (500 mg) by mouth in the morning & take 2 tablets (1000 mg) by mouth at night.   methocarbamol  (ROBAXIN ) 500 MG tablet Take 1 tablet (500 mg total) by mouth at bedtime as needed for muscle spasms.   metoprolol succinate (TOPROL-XL) 25 MG 24 hr tablet Take 25 mg by mouth at bedtime.   Multiple Vitamin (MULTIVITAMIN WITH MINERALS) TABS tablet Take 1 tablet by mouth daily.   nystatin  (MYCOSTATIN /NYSTOP ) powder Apply 1 Application topically 3 (three) times daily.   omeprazole (PRILOSEC) 40 MG capsule Take 40 mg by mouth in the morning.   Semaglutide,0.25 or 0.5MG /DOS, 2 MG/3ML SOPN Inject 1 mg into the skin once a week.   spironolactone  (ALDACTONE ) 50 MG tablet Take 50 mg by mouth at bedtime.   telmisartan (MICARDIS) 80 MG tablet Take 80 mg by mouth at bedtime.   torsemide (DEMADEX) 10 MG tablet Take 10 mg by mouth in the morning.   No facility-administered encounter medications on file as of 09/13/2024.    Allergies (verified) Iodinated contrast media, Oxycodone -acetaminophen , and Etodolac   History: Past Medical History:  Diagnosis Date   Anemia of chronic kidney failure, stage 3 (moderate) (HCC) 08/17/2019   Anemia of chronic renal failure    Aortic atherosclerosis    Arthritis    Bradycardia    Cataract    CKD (chronic kidney disease), stage III (HCC)    Colon polyps    Coronary artery disease 05/03/2001   a.) LHC 05/03/2001: 95% pLCx, 95% mLAD-1, 75% mLAD-2, 95% D2 --> LAD dissection occurred during procedure --> CVTS consult; b.) s/p 3v CABG 05/03/2001: LIMA-LAD,SVG-OM2, SVG-D2; c.) LHC/PCI 02/12/2004: 25% pRCA, 25% mRCA, 95% RPDA, 25%  LM, 25% pLCx, 95% mLCx (3.0 x 13 mm Cypher), 50% OM3, 25% pLAD, 100% mLAD, 25% D2, 25% LIMA-LAD, 100% SVG-OM2, 50% SVG-D2   Coronary artery dissection 05/03/2001   a.) LAD dissection occurred during diagnostic PCI at [facility]; IABP placed and patient sent for emergency CABG   Diabetic neuropathy (HCC)    Dysphagia    Exertional dyspnea    Gastritis    GERD (gastroesophageal reflux disease)    Headache    History of hiatal hernia    History of shingles    HLD (hyperlipidemia)    Hypertension    IBS (irritable bowel syndrome)    IDA (iron  deficiency anemia)    a.) followed by hematology; receiving iron  infusions PRN   Long-term use of aspirin  therapy    Mesenteric mass    Muscle tension dysphonia    PAF (paroxysmal atrial fibrillation) (HCC)    a.) CHA2DS2VASc = 5 (age, sex, HTN, vascular disease, T2DM) as of 02/06/2024; b.) rate/rhythm maintained on oral metoprolol succinate; no chronic  OAC   Peripheral vascular disease    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   a.) s/p total thyroidectomy for thyroid  cancer   Right BBB/left ant fasc block    Rotator cuff tendinitis, left    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   a.) SVG-OM2, SVG-D2, LIMA-LAD   SVT (supraventricular tachycardia)    T2DM (type 2 diabetes mellitus) (HCC)    Thyroid  cancer (HCC)    a.) s/p total thyroidectomy 03/14/2020   Trochanteric bursitis of left hip    Vitamin D  deficiency    Past Surgical History:  Procedure Laterality Date   ABDOMINAL HYSTERECTOMY     CHOLECYSTECTOMY     COLONOSCOPY WITH PROPOFOL  N/A 12/20/2016   Procedure: COLONOSCOPY WITH PROPOFOL ;  Surgeon: Lamar ONEIDA Holmes, MD;  Location[facility] END[facility]COPY;  Service: Endoscopy;  Laterality: N/A;   COLONOSCOPY WITH PROPOFOL  N/A 01/19/2022   Procedure: COLONOSCOPY WITH PROPOFOL ;  Surgeon: Maryruth Ole ONEIDA, MD;  Location: [facility] ENDOSCOPY;  Service: Endoscopy;  Laterality: N/A;   CORONARY ANGIOPLASTY WITH STENT PLACEMENT Left 02/12/2004   Procedure: CORONARY  ANGIOPLASTY WITH STENT PLACEMENT; Location: [facility]; Surgeon: Miquel Ellen, MD   CORONARY ARTERY BYPASS GRAFT N/A 05/03/2001   Procedure: CORONARY ARTERY BYPASS GRAFT; Location: [facility]; Surgeon: Bernardino Lares, MD   ESOPHAGOGASTRODUODENOSCOPY N/A 03/15/2023   Procedure: ESOPHAGOGASTRODUODENOSCOPY (EGD);  Surgeon: Maryruth Ole ONEIDA, MD;  Location: [facility] ENDOSCOPY;  Service: Endoscopy;  Laterality: N/A;   ESOPHAGOGASTRODUODENOSCOPY (EGD) WITH PROPOFOL  N/A 04/27/2017   Procedure: ESOPHAGOGASTRODUODENOSCOPY (EGD) WITH PROPOFOL ;  Surgeon: Holmes Lamar ONEIDA, MD;  Location: [facility] ENDOSCOPY;  Service: Endoscopy;  Laterality: N/A;   EUS N/A 04/24/2015   Procedure: ESOPHAGEAL ENDOSCOPIC ULTRASOUND (EUS) RADIAL;  Surgeon: Ozell Eva Montes, MD;  Location: [facility] ENDOSCOPY;  Service: Endoscopy;  Laterality: N/A;   LEFT HEART CATH AND CORONARY ANGIOGRAPHY Left 05/03/2001   Procedure: LEFT HEART CATH AND CORONARY ANGIOGRAPHY; Location: [facility]; Surgeon: Ozell Estelle, MD   TOTAL HIP ARTHROPLASTY Left 02/24/2021   Procedure: TOTAL HIP ARTHROPLASTY ANTERIOR APPROACH;  Surgeon: Kathlynn Ozell, MD;  Location: [facility] ORS;  Service: Orthopedics;  Laterality: Left;   TOTAL THYROIDECTOMY N/A 03/14/2020   Procedure: TOTAL THYROIDECTOMY; Location: [facility]   Family History  Problem Relation Age of Onset   Arthritis Sister    Heart disease Sister    Arthritis Sister    Arthritis Sister    Breast cancer Neg Hx    Social History   Socioeconomic History   Marital status: Married    Spouse name: Virgil   Number of children: Not on file   Years of education: Not on file   Highest education level: Associate degree: occupational, Scientist, product/process development, or vocational program  Occupational History   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    Social Drivers of Corporate investment banker Strain: Low Risk  (09/13/2024)   Overall Financial Resource Strain (CARDIA)    Difficulty of Paying Living Expenses: Not hard at all  Food Insecurity: No Food Insecurity (09/13/2024)   Hunger Vital Sign    Worried About Running Out of Food in the Last Year: Never  true    Ran Out of Food in the Last Year: Never true  Transportation Needs: No Transportation Needs (09/13/2024)   PRAPARE - Administrator, Civil Service (Medical): No    Lack of Transportation (Non-Medical): No  Physical Activity: Insufficiently Active (09/13/2024)   Exercise Vital Sign    Days of Exercise per Week: 2 days    Minutes of Exercise per Session: 20 min  Stress: No Stress Concern Present (09/13/2024)   [employer] of Occupational Health - Occupational Stress Questionnaire    Feeling of Stress: Not at all  Social Connections: Socially Integrated (09/13/2024)   Social Connection and Isolation Panel    Frequency of Communication with Friends and Family: More than three times a week    Frequency of Social Gatherings with Friends and Family: Once a week    Attends Religious Services: More than 4 times per year    Active Member of [employer] or Organizations: Yes    Attends Engineer, structural: More than 4 times per year    Marital Status: Married    Tobacco Counseling Counseling given: Not Answered    Clinical Intake:  Pre-visit preparation completed: Yes  Pain : No/denies pain Pain Score: 0-No pain     BMI - recorded: 36.6 Nutritional Status: BMI > 30  Obese Nutritional Risks: None Diabetes: Yes CBG done?: No Did pt. bring in CBG monitor from home?: No  Lab Results  Component Value Date   HGBA1C 7.4 03/15/2024   HGBA1C 6.7 08/29/2023   HGBA1C 8.0  01/27/2023     How often do you need to have someone help you when you read instructions, pamphlets, or other written materials from your doctor or pharmacy?: 1 - Never  Interpreter Needed?: No  Information entered by :: JHONNIE DAS, LPN   Activities of Daily Living     09/13/2024    2:05 PM 09/10/2024   10:11 AM  In your present state of health, do you have any difficulty performing the following activities:  Hearing? 0 0  Vision? 0 0  Difficulty concentrating or making decisions? 0 0  Walking or climbing stairs? 0 0  Dressing or bathing? 0 0  Doing errands, shopping? 0 0  Preparing Food and eating ? N N  Using the Toilet? N N  In the past six months, have you accidently leaked urine? N N  Do you have problems with loss of bowel control? N N  Managing your Medications? N N  Managing your Finances? N N  Housekeeping or managing your Housekeeping? N N    Patient Care Team: Bernardo Fend, DO as PCP - General (Internal Medicine) Rennie Cindy SAUNDERS, MD as Consulting Physician (Hematology and Oncology) Laurice Francis NOVAK, OD (Optometry)  I have updated your Care Teams any recent Medical Services you may have received from other providers in the past year.     Assessment:   This is a routine wellness examination for Hailey Howard.  Hearing/Vision screen Hearing Screening - Comments:: NO AIDS Vision Screening - Comments:: WEARS GLASSES- DR.NICE- SEEN TWICE PER YEAR   Goals Addressed             This Visit's Progress    DIET - EAT MORE FRUITS AND VEGETABLES         Depression Screen     09/13/2024    2:02 PM 08/31/2024    2:16 PM 05/29/2024   10:13 AM 10/27/2023    2:44 PM 10/05/2023    1:27  PM 09/08/2023    2:37 PM 09/05/2023    8:01 AM  PHQ 2/9 Scores  PHQ - 2 Score 0 0 0 0 0 0 0  PHQ- 9 Score 0   0       Fall Risk     09/13/2024    2:05 PM 09/10/2024   10:11 AM 05/29/2024   10:13 AM 03/20/2024   11:21 AM 10/27/2023    2:42 PM  Fall Risk   Falls in  the past year? 0 0 0 0 0  Number falls in past yr: 0 0 0 0 0  Injury with Fall? 0 0 0 0 0  Risk for fall due to : No Fall Risks  No Fall Risks No Fall Risks   Follow up Falls evaluation completed;Falls prevention discussed  Falls evaluation completed Falls evaluation completed     MEDICARE RISK AT HOME:  Medicare Risk at Home Any stairs in or around the home?: Yes If so, are there any without handrails?: No Home free of loose throw rugs in walkways, pet beds, electrical cords, etc?: Yes Adequate lighting in your home to reduce risk of falls?: Yes Life alert?: No Use of a cane, walker or w/c?: Yes (CANE OCCASIONALLY) Grab bars in the bathroom?: Yes Shower chair or bench in shower?: No Elevated toilet seat or a handicapped toilet?: No  TIMED UP AND GO:  Was the test performed?  No  Cognitive Function: 6CIT completed        09/13/2024    2:07 PM 09/08/2023    2:49 PM  6CIT Screen  What Year? 0 points 0 points  What month? 0 points 0 points  What time? 0 points 0 points  Count back from 20 0 points 0 points  Months in reverse 0 points 0 points  Repeat phrase 0 points 0 points  Total Score 0 points 0 points    Immunizations Immunization History  Administered Date(s) Administered   Fluad Trivalent(High Dose 65+) 08/26/2010, 09/03/2011, 09/29/2012   INFLUENZA, HIGH DOSE SEASONAL PF 07/31/2024   Influenza-Unspecified 08/26/2010, 09/03/2011, 09/29/2012, 10/14/2015, 09/19/2019   Moderna Sars-Covid-2 Vaccination 02/04/2020, 02/25/2020   PNEUMOCOCCAL CONJUGATE-20 05/30/2023   Zoster Recombinant(Shingrix) 10/10/2019, 05/28/2020    Screening Tests Health Maintenance  Topic Date Due   COVID-19 Vaccine (3 - Moderna risk series) 03/24/2020   FOOT EXAM  02/03/2024   Diabetic kidney evaluation - Urine ACR  08/09/2024   HEMOGLOBIN A1C  09/14/2024   Mammogram  01/01/2025   OPHTHALMOLOGY EXAM  02/15/2025   Diabetic kidney evaluation - eGFR measurement  08/31/2025   Medicare  Annual Wellness (AWV)  09/13/2025   DEXA SCAN  12/03/2026   Colonoscopy  01/19/2027   Pneumococcal Vaccine: 50+ Years  Completed   Hepatitis C Screening  Completed   Zoster Vaccines- Shingrix  Completed   Meningococcal B Vaccine  Aged Out   DTaP/Tdap/Td  Discontinued    Health Maintenance Items Addressed: NEEDS TDAP; UTD ON COLONOSCOPY, MAMMOGRAM & BDS  Additional Screening:  Vision Screening: Recommended annual ophthalmology exams for early detection of glaucoma and other disorders of the eye. Is the patient up to date with their annual eye exam?  Yes  Who is the provider or what is the name of the office in which the patient attends annual eye exams? DR.NICE  Dental Screening: Recommended annual dental exams for proper oral hygiene  Community Resource Referral / Chronic Care Management: CRR required this visit?  No   CCM required this visit?  No  Plan:    I have personally reviewed and noted the following in the patient's chart:   Medical and social history Use of alcohol, tobacco or illicit drugs  Current medications and supplements including opioid prescriptions. Patient is not currently taking opioid prescriptions. Functional ability and status Nutritional status Physical activity Advanced directives List of other physicians Hospitalizations, surgeries, and ER visits in previous 12 months Vitals Screenings to include cognitive, depression, and falls Referrals and appointments  In addition, I have reviewed and discussed with patient certain preventive protocols, quality metrics, and best practice recommendations. A written personalized care plan for preventive services as well as general preventive health recommendations were provided to patient.   Jhonnie GORMAN Das, LPN   [id number]   After Visit Summary: (MyChart) Due to this being a telephonic visit, the after visit summary with patients personalized plan was offered to patient via MyChart   Notes: Nothing  significant to report at this time.

## 2024-09-14 ENCOUNTER — Ambulatory Visit
Admission: RE | Admit: 2024-09-14 | Discharge: 2024-09-14 | Disposition: A | Discharge status: Home / Self Care | Source: Ambulatory Visit | Attending: Gastroenterology | Admitting: Gastroenterology

## 2024-09-14 DIAGNOSIS — Z0389 Encounter for observation for other suspected diseases and conditions ruled out: Secondary | ICD-10-CM | POA: Diagnosis not present

## 2024-09-14 DIAGNOSIS — R1013 Epigastric pain: Secondary | ICD-10-CM | POA: Insufficient documentation

## 2024-09-14 MED ORDER — TECHNETIUM TC 99M SULFUR COLLOID
2.0300 | Freq: Once | INTRAVENOUS | Status: AC | PRN
  Administered 2024-09-14: 2 via ORAL

## 2024-09-24 ENCOUNTER — Encounter: Payer: Self-pay | Admitting: Radiology

## 2024-09-29 ENCOUNTER — Other Ambulatory Visit: Payer: Self-pay | Admitting: Internal Medicine

## 2024-09-29 DIAGNOSIS — I2510 Atherosclerotic heart disease of native coronary artery without angina pectoris: Secondary | ICD-10-CM

## 2024-10-01 NOTE — Telephone Encounter (Signed)
 Rx 05/29/24 #90 1RF- too soon Requested Prescriptions  Pending Prescriptions Disp Refills   atorvastatin  (LIPITOR) 40 MG tablet [Pharmacy Med Name: Atorvastatin  Calcium  40 MG Oral Tablet] 100 tablet 2    Sig: TAKE 1 TABLET BY MOUTH DAILY AT  12 NOON     Cardiovascular:  Antilipid - Statins Failed - 10/01/2024  4:26 PM      Failed - Lipid Panel in normal range within the last 12 months    No results found for: CHOL, POCCHOL, CHOLTOT No results found for: LDLCALC, LDLC, HIRISKLDL, POCLDL, LDLDIRECT, REALLDLC, TOTLDLC No results found for: HDL, POCHDL No results found for: TRIG, POCTRIG       Passed - Patient is not pregnant      Passed - Valid encounter within last 12 months    Recent Outpatient Visits           1 month ago Viral upper respiratory tract infection   [facility] Bernardo Fend, DO   2 months ago Burning sensation of feet   [facility] Bernardo Fend, DO   2 months ago Acute thoracic back pain, unspecified back pain laterality   [facility] Leavy Mole, PA-C   2 months ago Great toe pain, right   [facility] [place], Mole, PA-C   4 months ago Hypertension, unspecified type   [facility] Bernardo Fend, DO       Future Appointments             In 1 month Bernardo Fend, DO [facility], [place]

## 2024-10-15 ENCOUNTER — Ambulatory Visit: Payer: Self-pay

## 2024-10-15 NOTE — Telephone Encounter (Signed)
 FYI Only or Action Required?: Action required by provider: request for appointment. Scheduled soonest appt with different provider on 12/2. Pt would like to know if she can be worked in with PCP in next 2 weeks, otherwise is fine to be seen by different provider.  Patient was last seen in primary care on 08/13/2024 by Bernardo Fend, DO.  Called Nurse Triage reporting Shortness of Breath.  Symptoms began several weeks ago.  Interventions attempted: Other: Being followed by [facility] Cardiology for SOB. Had echo on Friday.  Symptoms are: unchanged.  Triage Disposition: See PCP Within 2 Weeks  Patient/caregiver understands and will follow disposition?: Yes  Copied from CRM [id number]. Topic: Clinical - Red Word Triage >> Oct 15, 2024 12:55 PM Joesph NOVAK wrote: Red Word that prompted transfer to Nurse Triage: Breathing issues, SOB, fatigue Reason for Disposition  [1] MILD longstanding difficulty breathing (e.g., minimal/no SOB at rest, SOB with walking, pulse < 100) AND [2] SAME as normal    Ongoing mild dyspnea x3 weeks. Denies lung hx. Being followed by cardiology for current issue.  Answer Assessment - Initial Assessment Questions Pt reports onset of mild dyspnea with exertion 3 weeks ago. Has chronic mild cough since throat surgery in 2021. Denies lung hx, reports hx of afib, CABG and htn. Denies fever or CP, reports some chest heaviness. Spo2 99%, HR 86, and BP 135/65. Seen by [facility] cardiology on 11/10 for current symptoms. Symptoms unchanged since appt. Plan at that time to get echo, performed on Friday. Advised pt to call [facility] cardiology to schedule f/u appt and to discuss echo results. Scheduled PC appt with different provider on 12/1 at home office d/t no PCP availability within timeframe to r/o lung issues. Offered appt today at different office in pt region, pt declines and would like to stay at home office. Advised UC or ED for worsening symptoms.   1. RESPIRATORY STATUS: Describe your  breathing? (e.g., wheezing, shortness of breath, unable to speak, severe coughing)      Mildly labored   2. ONSET: When did this breathing problem begin?      3 weeks ago  3. PATTERN Does the difficult breathing come and go, or has it been constant since it started?      Intermittent  4. SEVERITY: How bad is your breathing? (e.g., mild, moderate, severe)      Mild SOB, intermittent  5. RECURRENT SYMPTOM: Have you had difficulty breathing before? If Yes, ask: When was the last time? and What happened that time?      Pt unsure  6. CARDIAC HISTORY: Do you have any history of heart disease? (e.g., heart attack, angina, bypass surgery, angioplasty)      Afib, htn, CABG. Takes metoprolol. Normal BP 135/65, checked it last week.   7. LUNG HISTORY: Do you have any history of lung disease?  (e.g., pulmonary embolus, asthma, emphysema)     Denies  8. CAUSE: What do you think is causing the breathing problem?      Maybe d/t not drinking enough water  9. OTHER SYMPTOMS: Do you have any other symptoms? (e.g., chest pain, cough, dizziness, fever, runny nose)     Mild cough with some clear phlegm since 2021. Heaviness in chest. Felt like she had brief episode at church 2 weeks ago where she felt like she was gonna pass out while standing and clapping hands to music. Denies fever.  10. O2 SATURATION MONITOR:  Do you use an oxygen saturation monitor (pulse oximeter) at  home? If Yes, ask: What is your reading (oxygen level) today? What is your usual oxygen saturation reading? (e.g., 95%)       99% SpO2, HR 86 this morning. Does not wear oxygen.  Protocols used: Breathing Difficulty-A-AH

## 2024-10-22 ENCOUNTER — Ambulatory Visit: Admitting: Family Medicine

## 2024-10-22 NOTE — Progress Notes (Deleted)
   Acute Office Visit  Subjective:     Patient ID: Hailey Howard, female    DOB: Apr 09, 1956, 68 y.o.   MRN: [id number]  No chief complaint on file.   HPI Patient is in today for concerns of shortness of breath. She is a new patient to myself, established in office and followed by Dr. Bernardo.   Seen by cardiology on 10/01/24. Experiencing shortness of breat at time of visit. Addressed by cardiology and plan included repeating echo due to previously demonstrated diastolic dysfunction with new symptom (shortness of breath). No changes were made to antihypertensive medication regimen at time of visit.   ROS      Objective:    There were no vitals taken for this visit. {Vitals History (Optional):23777}  Physical Exam  No results found for any visits on 10/22/24.      Assessment & Plan:   Problem List Items Addressed This Visit       Cardiovascular and Mediastinum   Hypertension   Atrial fibrillation (HCC)   Other Visit Diagnoses       Shortness of breath    -  Primary     Diastolic dysfunction           No orders of the defined types were placed in this encounter.   No follow-ups on file.  LAYMON LOISE CORE, FNP

## 2024-10-29 ENCOUNTER — Other Ambulatory Visit: Payer: Self-pay | Admitting: Internal Medicine

## 2024-10-29 DIAGNOSIS — E1142 Type 2 diabetes mellitus with diabetic polyneuropathy: Secondary | ICD-10-CM

## 2024-10-29 DIAGNOSIS — I2510 Atherosclerotic heart disease of native coronary artery without angina pectoris: Secondary | ICD-10-CM

## 2024-10-30 LAB — PROTEIN / CREATININE RATIO, URINE
Albumin, U: 32
Creatinine, Urine: 100.2

## 2024-10-30 LAB — MICROALBUMIN / CREATININE URINE RATIO: Microalb Creat Ratio: 31.9

## 2024-10-30 LAB — HEMOGLOBIN A1C: Hemoglobin A1C: 7.6

## 2024-10-31 NOTE — Telephone Encounter (Signed)
 Requested Prescriptions  Pending Prescriptions Disp Refills   gabapentin  (NEURONTIN ) 300 MG capsule [Pharmacy Med Name: GABAPENTIN  CAP 300MG  (NEUR)] 100 capsule 2    Sig: TAKE 1 CAPSULE BY MOUTH DAILY     Neurology: Anticonvulsants - gabapentin  Failed - 10/31/2024  2:35 PM      Failed - Cr in normal range and within 360 days    Creatinine  Date Value Ref Range Status  03/02/2024 1.84 (H) 0.44 - 1.00 mg/dL Final   Creat  Date Value Ref Range Status  06/07/2023 1.98 (H) 0.50 - 1.05 mg/dL Final   Creatinine, Ser  Date Value Ref Range Status  08/31/2024 1.53 (H) 0.44 - 1.00 mg/dL Final   Creatinine, Urine  Date Value Ref Range Status  10/28/2022 159.2  Final         Passed - Completed PHQ-2 or PHQ-9 in the last 360 days      Passed - Valid encounter within last 12 months    Recent Outpatient Visits           2 months ago Viral upper respiratory tract infection   [facility] Bernardo Fend, DO   3 months ago Burning sensation of feet   [facility] Bernardo Fend, DO   3 months ago Acute thoracic back pain, unspecified back pain laterality   [facility] Leavy Mole, PA-C   3 months ago Great toe pain, right   [facility] Leavy Mole, PA-C   5 months ago Hypertension, unspecified type   [facility] Bernardo Fend, DO       Future Appointments             In 4 weeks Bernardo Fend, DO [facility] [place]             atorvastatin  (LIPITOR) 40 MG tablet [Pharmacy Med Name: Atorvastatin  Calcium  40 MG Oral Tablet] 80 tablet     Sig: TAKE 1 TABLET BY MOUTH DAILY AT  12 NOON     Cardiovascular:  Antilipid - Statins Failed - 10/31/2024  2:35 PM      Failed - Lipid Panel in normal range within the last 12 months    No results found for: CHOL, POCCHOL, CHOLTOT No results found for:  LDLCALC, LDLC, HIRISKLDL, POCLDL, LDLDIRECT, REALLDLC, TOTLDLC No results found for: HDL, POCHDL No results found for: TRIG, POCTRIG       Passed - Patient is not pregnant      Passed - Valid encounter within last 12 months    Recent Outpatient Visits           2 months ago Viral upper respiratory tract infection   [facility] Bernardo Fend, DO   3 months ago Burning sensation of feet   [facility] Bernardo Fend, DO   3 months ago Acute thoracic back pain, unspecified back pain laterality   [facility] Leavy Mole, PA-C   3 months ago Great toe pain, right   [facility] [place] [place], PA-C   5 months ago Hypertension, unspecified type   [facility] Bernardo Fend, DO       Future Appointments             In 4 weeks Bernardo Fend, DO [facility] [place]

## 2024-11-18 ENCOUNTER — Encounter: Payer: Self-pay | Admitting: Internal Medicine

## 2024-11-22 ENCOUNTER — Encounter: Payer: Self-pay | Admitting: Internal Medicine

## 2024-11-26 DIAGNOSIS — M1990 Unspecified osteoarthritis, unspecified site: Secondary | ICD-10-CM

## 2024-11-26 MED ORDER — METHOCARBAMOL 500 MG PO TABS: 500.0000 mg | ORAL_TABLET | Freq: Every evening | ORAL | 2 refills | Status: DC | PRN

## 2024-11-26 NOTE — Telephone Encounter (Signed)
 methocarbamol (ROBAXIN) 500 MG tablet

## 2024-11-29 ENCOUNTER — Other Ambulatory Visit: Payer: Self-pay

## 2024-11-29 ENCOUNTER — Encounter: Payer: Self-pay | Admitting: Internal Medicine

## 2024-11-29 ENCOUNTER — Ambulatory Visit: Admitting: Internal Medicine

## 2024-11-29 VITALS — BP 132/70 | HR 79 | Temp 98.2°F | Resp 16 | Ht 67.0 in | Wt 233.0 lb

## 2024-11-29 DIAGNOSIS — I10 Essential (primary) hypertension: Secondary | ICD-10-CM

## 2024-11-29 DIAGNOSIS — Z1231 Encounter for screening mammogram for malignant neoplasm of breast: Secondary | ICD-10-CM

## 2024-11-29 DIAGNOSIS — E11621 Type 2 diabetes mellitus with foot ulcer: Secondary | ICD-10-CM | POA: Diagnosis not present

## 2024-11-29 DIAGNOSIS — L97529 Non-pressure chronic ulcer of other part of left foot with unspecified severity: Secondary | ICD-10-CM | POA: Diagnosis not present

## 2024-11-29 DIAGNOSIS — I2510 Atherosclerotic heart disease of native coronary artery without angina pectoris: Secondary | ICD-10-CM | POA: Diagnosis not present

## 2024-11-29 DIAGNOSIS — E1142 Type 2 diabetes mellitus with diabetic polyneuropathy: Secondary | ICD-10-CM | POA: Diagnosis not present

## 2024-11-29 DIAGNOSIS — Z794 Long term (current) use of insulin: Secondary | ICD-10-CM

## 2024-11-29 MED ORDER — ATORVASTATIN CALCIUM 40 MG PO TABS: 40.0000 mg | ORAL_TABLET | Freq: Every day | ORAL | 1 refills | Status: AC

## 2024-11-29 NOTE — Progress Notes (Signed)
 "  Established Patient Office Visit  Subjective   Patient ID: Hailey Howard, female    DOB: 11-17-1956  Age: 69 y.o. MRN: [id number]  Chief Complaint  Patient presents with   Medical Management of Chronic Issues    6 month recheck    HPI  Patient is here for follow up on chronic medical conditions.   Discussed the use of AI scribe software for clinical note transcription with the patient, who gave verbal consent to proceed.  History of Present Illness  Hailey Howard is a 69 year old female with chronic kidney disease who presents for a routine follow-up visit.  Her recent labs were reviewed by endocrinology, with kidney function and cholesterol noted and no medication changes made. She continues metformin  and insulin  at the same doses. Due to high cost of Ozempic, she is interested in patient assistance for Rybelsus. She has an ulcer on her left great toe that she is managing carefully and was seen by podiatry on 11/05/2024.   Hypertension/Atrial Fibrillation: -Medications: Amlodipine  5 mg, Telmisartan 80 mg, Spironolactone  50 mg, Metoprolol 25 mg, Torsemide 10 mg -Patient is compliant with above medications and reports no side effects. -Denies any SOB, CP, vision changes, LE edema or symptoms of hypotension -Follows with [facility]iology at [facility] -Echo 04/13/22 normal   HLD/CAD/Hx of CABG: -Medications: Lipitor 40 mg, aspirin  -Patient is compliant with above medications and reports no side effects.  -Last lipid panel: 12/25 TC 153, LDL 90, HDL 35.9, triglycerides 136 -Hx of CABG in 2002, PCI stent 2005 - she developed post-operative A.Fib after CABG in 2002  Diabetes, Type 2: -Last A1c 7.6% 12/25 -Medications: Metformin  1000 mg BID, Ozempic increased to 2 mg but is expensive, thinking switching to Rybelsus Lantus  decreased 28 units at night and Fiasp  12 units at breakfast, 16 units at lunch and 8 units at dinner and Gabapentin  300 mg once daily  -Follows with  Endocrinology -Foot exam: Due -Microalbumin: UTD 12/25 -Statin: yes -PNA vaccine: UTD -Denies symptoms of polyuria, polydipsia, numbness extremities, foot ulcers/trauma.   Hx of Papillary Carcinoma of the Thyroid : -Currently on Levothyroxine  137 mcg -TSH 4/25 0.769 -Partial thyroidectomy in 2009 due to nodules, then total thyroidectomy in 2021 after cancer diagnosis   CKD3b/AoCD: -Last creatinine 12/25 1.6, GFR 34 -Follows with Nephrology  -Lillis Needle but caused frequent yeast infections so this was discontinued  -Also follows with Heme/Onc -Does take iron  PO, had IV iron  which did not go well  -Currently takes Vitamin D  1000 IU  GERD: -Currently on Prilosec 40 mg, symptoms well controlled  IBS: -Currently on Linzess 72 mcg taking every other day  Health Maintenance: -Blood work UTD -Mammogram 2/25, Birads-1, already scheduled 2/10 -Colonoscopy 2/23, repeat in 5 years   Patient Active Problem List   Diagnosis Date Noted   History of thyroid  cancer 11/30/2023   Lumbar radiculopathy 07/26/2023   Spinal stenosis, lumbar region, with neurogenic claudication 07/26/2023   Chronic painful diabetic neuropathy (HCC) 07/26/2023   Chronic pain syndrome 07/26/2023   History of iron  deficiency anemia 02/18/2023   S/P hip replacement 02/24/2021   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   Laryngospasms 10/21/2020   Hoarseness 10/21/2020   Dysphagia 10/21/2020   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/05/2020   S/P total thyroidectomy 03/14/2020   Multinodular goiter 03/04/2020   Chronic abdominal pain 11/29/2019   Anemia of chronic kidney failure, stage 3 (moderate) (HCC) 08/17/2019   Abdominal pain, recurrent 07/01/2019   Irritable bowel syndrome with constipation 07/01/2019  IDA (iron  deficiency anemia) 06/29/2019   Type 2 diabetes mellitus with both eyes affected by mild nonproliferative retinopathy without macular edema, with long-term current use of insulin  (HCC) 09/12/2018    Type 2 diabetes mellitus with vascular disease (HCC) 09/12/2018   Uncontrolled type 2 diabetes mellitus with hyperglycemia (HCC) 09/12/2018   Morbid obesity (HCC) 09/12/2018   Elevation of muscle enzyme 08/30/2018   Elevated erythrocyte sedimentation rate 08/30/2018   Myalgia 08/15/2018   Chronic pain of both shoulders 08/15/2018   Sacroiliitis 02/15/2018   Schatzki's ring 06/21/2017   Chronic superficial gastritis without bleeding 06/21/2017   Gastroesophageal reflux disease without esophagitis 04/20/2017   Coronary artery disease 08/02/2016   Hyperlipidemia 08/02/2016   Hypertension 08/02/2016   Tachycardia 08/02/2016   Mesenteric mass 08/02/2016   Bursitis of left shoulder 05/07/2016   Rotator cuff tendinitis, left 05/07/2016   Impingement syndrome of left shoulder 05/07/2016   Abnormal ECG 12/24/2015   DM type 2 with diabetic peripheral neuropathy (HCC) 09/19/2015   Insulin -treated type 2 diabetes mellitus (HCC) 09/19/2015   Uncontrolled type 2 diabetes mellitus with hyperglycemia, with long-term current use of insulin  (HCC) 09/19/2015   Chronic kidney disease, stage III (moderate) (HCC) 09/10/2015   Hypokalemia 09/10/2015   Bundle branch block 09/08/2015   Edema 06/16/2015   Atrial fibrillation (HCC) 05/01/2014   A-fib (HCC) 05/01/2014   Hypothyroidism 05/01/2014   Past Medical History:  Diagnosis Date   Anemia of chronic kidney failure, stage 3 (moderate) (HCC) 08/17/2019   Anemia of chronic renal failure    Aortic atherosclerosis    Arthritis    Bradycardia    Cataract    CKD (chronic kidney disease), stage III (HCC)    Colon polyps    Coronary artery disease 05/03/2001   a.) LHC 05/03/2001: 95% pLCx, 95% mLAD-1, 75% mLAD-2, 95% D2 --> LAD dissection occurred during procedure --> CVTS consult; b.) s/p 3v CABG 05/03/2001: LIMA-LAD,SVG-OM2, SVG-D2; c.) LHC/PCI 02/12/2004: 25% pRCA, 25% mRCA, 95% RPDA, 25% LM, 25% pLCx, 95% mLCx (3.0 x 13 mm Cypher), 50% OM3, 25% pLAD,  100% mLAD, 25% D2, 25% LIMA-LAD, 100% SVG-OM2, 50% SVG-D2   Coronary artery dissection 05/03/2001   a.) LAD dissection occurred during diagnostic PCI at [facility]; IABP placed and patient sent for emergency CABG   Diabetic neuropathy (HCC)    Dysphagia    Exertional dyspnea    Gastritis    GERD (gastroesophageal reflux disease)    Headache    History of hiatal hernia    History of shingles    HLD (hyperlipidemia)    Hypertension    IBS (irritable bowel syndrome)    IDA (iron  deficiency anemia)    a.) followed by hematology; receiving iron  infusions PRN   Long-term use of aspirin  therapy    Mesenteric mass    Muscle tension dysphonia    PAF (paroxysmal atrial fibrillation) (HCC)    a.) CHA2DS2VASc = 5 (age, sex, HTN, vascular disease, T2DM) as of 02/06/2024; b.) rate/rhythm maintained on oral metoprolol succinate; no chronic OAC   Peripheral vascular disease    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   a.) s/p total thyroidectomy for thyroid  cancer   Right BBB/left ant fasc block    Rotator cuff tendinitis, left    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   a.) SVG-OM2, SVG-D2, LIMA-LAD   SVT (supraventricular tachycardia)    T2DM (type 2 diabetes mellitus) (HCC)    Thyroid  cancer (HCC)    a.) s/p total thyroidectomy 03/14/2020   Trochanteric bursitis of  left hip    Vitamin D  deficiency    Past Surgical History:  Procedure Laterality Date   ABDOMINAL HYSTERECTOMY     CHOLECYSTECTOMY     COLONOSCOPY WITH PROPOFOL  N/A 12/20/2016   Procedure: COLONOSCOPY WITH PROPOFOL ;  Surgeon: Lamar ONEIDA Holmes, MD;  Location: [facility] ENDOSCOPY;  Service: Endoscopy;  Laterality: N/A;   COLONOSCOPY WITH PROPOFOL  N/A 01/19/2022   Procedure: COLONOSCOPY WITH PROPOFOL ;  Surgeon: Maryruth Ole ONEIDA, MD;  Location: [facility] ENDOSCOPY;  Service: Endoscopy;  Laterality: N/A;   CORONARY ANGIOPLASTY WITH STENT PLACEMENT Left 02/12/2004   Procedure: CORONARY ANGIOPLASTY WITH STENT PLACEMENT; Location: [facility]; Surgeon: Miquel Ellen, MD   CORONARY ARTERY BYPASS GRAFT N/A 05/03/2001   Procedure: CORONARY ARTERY BYPASS GRAFT; Location: [facility]; Surgeon: Bernardino Lares, MD   ESOPHAGOGASTRODUODENOSCOPY N/A 03/15/2023   Procedure: ESOPHAGOGASTRODUODENOSCOPY (EGD);  Surgeon: Maryruth Ole ONEIDA, MD;  Location: [facility] ENDOSCOPY;  Service: Endoscopy;  Laterality: N/A;   ESOPHAGOGASTRODUODENOSCOPY (EGD) WITH PROPOFOL  N/A 04/27/2017   Procedure: ESOPHAGOGASTRODUODENOSCOPY (EGD) WITH PROPOFOL ;  Surgeon: Holmes Lamar ONEIDA, MD;  Location: [facility] ENDOSCOPY;  Service: Endoscopy;  Laterality: N/A;   EUS N/A 04/24/2015   Procedure: ESOPHAGEAL ENDOSCOPIC ULTRASOUND (EUS) RADIAL;  Surgeon: Ozell Eva Montes, MD;  Location: [facility] ENDOSCOPY;  Service: Endoscopy;  Laterality: N/A;   LEFT HEART CATH AND CORONARY ANGIOGRAPHY Left 05/03/2001   Procedure: LEFT HEART CATH AND CORONARY ANGIOGRAPHY; Location: [facility]; Surgeon: Ozell Estelle, MD   TOTAL HIP ARTHROPLASTY Left 02/24/2021   Procedure: TOTAL HIP ARTHROPLASTY ANTERIOR APPROACH;  Surgeon: Kathlynn Ozell, MD;  Location: [facility] ORS;  Service: Orthopedics;  Laterality: Left;   TOTAL THYROIDECTOMY N/A 03/14/2020   Procedure: TOTAL THYROIDECTOMY; Location: [facility]   Social History   Tobacco Use   Smoking status: Never   Smokeless tobacco: Never  Vaping Use   Vaping status: Never Used  Substance Use Topics   Alcohol use: No   Drug use: No   Social History   Socioeconomic History   Marital status: Married    Spouse name: Virgil   Number of children: Not on file   Years of education: Not on file   Highest education level: Associate degree: occupational, scientist, product/process development, or vocational program  Occupational History   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    Social Drivers of Health   Tobacco Use: Low Risk (11/29/2024)   Patient History    Smoking Tobacco Use: Never    Smokeless Tobacco Use: Never    Passive Exposure: Not on file  Financial Resource Strain: Low Risk (09/13/2024)   Overall Financial Resource Strain (CARDIA)    Difficulty of Paying Living Expenses: Not hard at all  Food Insecurity: No Food Insecurity (09/13/2024)   Epic    Worried About Programme Researcher, Broadcasting/film/video in the Last Year: Never true    Ran Out of Food in the Last Year: Never true  Transportation Needs: No Transportation Needs (09/13/2024)   Epic    Lack of Transportation (Medical): No    Lack of Transportation (Non-Medical): No  Physical Activity: Insufficiently Active (09/13/2024)   Exercise Vital  Sign    Days of Exercise per Week: 2 days    Minutes of Exercise per Session: 20 min  Stress: No Stress Concern Present (09/13/2024)   [employer] of Occupational Health - Occupational Stress Questionnaire    Feeling of Stress: Not at all  Social Connections: Socially Integrated (09/13/2024)   Social Connection and Isolation Panel    Frequency of Communication with Friends and Family: More than three times a week    Frequency of Social Gatherings with Friends and Family: Once a week    Attends Religious Services: More than 4 times per year    Active Member of Clubs or Organizations: Yes    Attends Banker Meetings: More than 4 times per year    Marital Status: Married  Catering Manager Violence: Not At Risk (09/13/2024)   Epic    Fear of Current or Ex-Partner: No    Emotionally Abused: No    Physically Abused: No    Sexually Abused: No  Depression (PHQ2-9): Low Risk (09/13/2024)   Depression (PHQ2-9)    PHQ-2 Score: 0  Alcohol Screen: Low Risk (09/13/2024)   Alcohol Screen    Last Alcohol Screening Score  (AUDIT): 0  Housing: Unknown (09/13/2024)   Epic    Unable to Pay for Housing in the Last Year: No    Number of Times Moved in the Last Year: Not on file    Homeless in the Last Year: No  Utilities: Not At Risk (09/13/2024)   Epic    Threatened with loss of utilities: No  Health Literacy: Adequate Health Literacy (09/13/2024)   B1300 Health Literacy    Frequency of need for help with medical instructions: Never   Family Status  Relation Name Status   Sister Ronal Minus (Not Specified)   Sister Sherrilyn Minus (Not Specified)   Sister Lanell Chancy (Not Specified)   Neg Hx  (Not Specified)  No partnership data on file   Family History  Problem Relation Age of Onset   Arthritis Sister    Heart disease Sister    Arthritis Sister    Arthritis Sister    Breast cancer Neg Hx    Allergies  Allergen Reactions   Iodinated Contrast Media Anaphylaxis, Hives and Itching   Oxycodone -Acetaminophen  Hives   Etodolac Itching      Review of Systems  All other systems reviewed and are negative.     Objective:     BP 132/70 (Cuff Size: Large)   Pulse 79   Temp 98.2 F (36.8 C) (Oral)   Resp 16   Ht 5' 7 (1.702 m)   Wt 233 lb (105.7 kg)   SpO2 99%   BMI 36.49 kg/m  BP Readings from Last 3 Encounters:  08/31/24 (!) 141/70  07/31/24 130/78  07/20/24 122/72   Wt Readings from Last 3 Encounters:  11/29/24 233 lb (105.7 kg)  08/31/24 234 lb (106.1 kg)  08/22/24 242 lb (109.8 kg)      Physical Exam Constitutional:      Appearance: Normal appearance.  HENT:     Head: Normocephalic and atraumatic.  Eyes:     Conjunctiva/sclera: Conjunctivae normal.  Cardiovascular:     Rate and Rhythm: Normal rate and regular rhythm.     Pulses:          Dorsalis pedis pulses are 2+ on the right side and 2+ on the left side.  Pulmonary:     Effort: Pulmonary effort is normal.  Breath sounds: Normal breath sounds.  Musculoskeletal:     Right foot: Normal range of motion. No  deformity, bunion, Charcot foot, foot drop or prominent metatarsal heads.     Left foot: Normal range of motion. No deformity, bunion, Charcot foot, foot drop or prominent metatarsal heads.  Feet:     Right foot:     Protective Sensation: 6 sites tested.  6 sites sensed.     Skin integrity: Skin integrity normal.     Toenail Condition: Right toenails are normal.     Left foot:     Protective Sensation: 6 sites tested.  6 sites sensed.     Skin integrity: Skin integrity normal.     Toenail Condition: Left toenails are normal.  Skin:    General: Skin is warm and dry.  Neurological:     General: No focal deficit present.     Mental Status: She is alert. Mental status is at baseline.  Psychiatric:        Mood and Affect: Mood normal.        Behavior: Behavior normal.      Results for orders placed or performed in visit on 11/29/24  Hemoglobin A1c  Result Value Ref Range   Hemoglobin A1C 7.6      Last CBC Lab Results  Component Value Date   WBC 7.6 08/31/2024   HGB 11.5 (L) 08/31/2024   HCT 36.3 08/31/2024   MCV 85.6 08/31/2024   MCH 27.1 08/31/2024   RDW 13.9 08/31/2024   PLT 325 08/31/2024   Last metabolic panel Lab Results  Component Value Date   GLUCOSE 145 (H) 08/31/2024   NA 135 08/31/2024   K 3.9 08/31/2024   CL 101 08/31/2024   CO2 24 08/31/2024   BUN 26 (H) 08/31/2024   CREATININE 1.53 (H) 08/31/2024   GFRNONAA 36 (L) 08/31/2024   CALCIUM  8.7 (L) 08/31/2024   PROT 7.4 03/02/2024   ALBUMIN 4.0 03/02/2024   LABGLOB 3.0 03/02/2024   AGRATIO 1.3 03/02/2024   BILITOT 0.6 03/02/2024   ALKPHOS 80 03/02/2024   AST 29 03/02/2024   ALT 26 03/02/2024   ANIONGAP 10 08/31/2024   Last lipids No results found for: CHOL, HDL, LDLCALC, LDLDIRECT, TRIG, CHOLHDL Last hemoglobin A1c Lab Results  Component Value Date   HGBA1C 7.6 10/30/2024   Last thyroid  functions Lab Results  Component Value Date   TSH 0.11 (L) 10/27/2023   Last vitamin D  Lab  Results  Component Value Date   VD25OH 47 10/27/2023   Last vitamin B12 and Folate Lab Results  Component Value Date   VITAMINB12 427 10/27/2023   FOLATE 18.7 10/27/2023      The ASCVD Risk score (Arnett DK, et al., 2019) failed to calculate for the following reasons:   Cannot find a previous HDL lab   Cannot find a previous total cholesterol lab    Assessment & Plan:   Assessment & Plan  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 Plans to switch to Rybelsus due to cost. Diabetic foot ulcer on left big toe. - Continue metformin  and insulin . - Apply for Rybelsus patient assistance through endocrinologist. - Screen for neuropathy progression.  Hyperlipidemia Cholesterol levels improved. Lipitor refill needed. - Ensure Lipitor prescription is set up for future refills.  Hypertension - Blood pressure stable here today, no changes made to medications and appropriate refills sent to pharmacy.   General health maintenance Mammogram due. Colonoscopy  and vaccines up to date. - Ordered mammogram.  - HM Diabetes Foot Exam - atorvastatin  (LIPITOR) 40 MG tablet; Take 1 tablet (40 mg total) by mouth daily at 12 noon.  Dispense: 90 tablet; Refill: 1 - MM 3D SCREENING MAMMOGRAM BILATERAL BREAST; Future   Return in about 6 months (around 05/29/2025).    Sharyle Fischer, DO "

## 2024-12-05 ENCOUNTER — Telehealth: Payer: Self-pay | Admitting: Internal Medicine

## 2024-12-05 NOTE — Telephone Encounter (Signed)
 Requested to soon.

## 2024-12-07 ENCOUNTER — Encounter: Payer: Self-pay | Admitting: Internal Medicine

## 2024-12-07 DIAGNOSIS — N183 Chronic kidney disease, stage 3 unspecified: Secondary | ICD-10-CM

## 2024-12-12 ENCOUNTER — Encounter: Payer: Self-pay | Admitting: Internal Medicine

## 2024-12-12 DIAGNOSIS — M1990 Unspecified osteoarthritis, unspecified site: Secondary | ICD-10-CM

## 2024-12-12 MED ORDER — METHOCARBAMOL 500 MG PO TABS: 500.0000 mg | ORAL_TABLET | Freq: Every evening | ORAL | 2 refills | Status: AC | PRN

## 2024-12-24 ENCOUNTER — Telehealth: Payer: Self-pay

## 2025-01-02 ENCOUNTER — Encounter

## 2025-03-01 ENCOUNTER — Ambulatory Visit

## 2025-03-01 ENCOUNTER — Other Ambulatory Visit

## 2025-03-01 ENCOUNTER — Ambulatory Visit: Admitting: Internal Medicine

## 2025-05-29 ENCOUNTER — Ambulatory Visit: Admitting: Internal Medicine

## 2025-09-19 ENCOUNTER — Ambulatory Visit
